# Patient Record
Sex: Female | Born: 1940 | Race: Black or African American | Hispanic: No | Smoking: Current every day smoker
Health system: Southern US, Community
[De-identification: ages and names within clinical notes are randomized; demographics above are authoritative.]

## PROBLEM LIST (undated history)

## (undated) DIAGNOSIS — I739 Peripheral vascular disease, unspecified: Secondary | ICD-10-CM

## (undated) DIAGNOSIS — I351 Nonrheumatic aortic (valve) insufficiency: Secondary | ICD-10-CM

## (undated) DIAGNOSIS — F0390 Unspecified dementia without behavioral disturbance: Secondary | ICD-10-CM

## (undated) DIAGNOSIS — I77810 Thoracic aortic ectasia: Secondary | ICD-10-CM

## (undated) DIAGNOSIS — I779 Disorder of arteries and arterioles, unspecified: Secondary | ICD-10-CM

## (undated) DIAGNOSIS — D510 Vitamin B12 deficiency anemia due to intrinsic factor deficiency: Secondary | ICD-10-CM

## (undated) DIAGNOSIS — R011 Cardiac murmur, unspecified: Secondary | ICD-10-CM

## (undated) DIAGNOSIS — R0602 Shortness of breath: Secondary | ICD-10-CM

## (undated) DIAGNOSIS — I712 Thoracic aortic aneurysm, without rupture, unspecified: Secondary | ICD-10-CM

## (undated) DIAGNOSIS — R609 Edema, unspecified: Secondary | ICD-10-CM

## (undated) DIAGNOSIS — M109 Gout, unspecified: Secondary | ICD-10-CM

## (undated) DIAGNOSIS — M1990 Unspecified osteoarthritis, unspecified site: Secondary | ICD-10-CM

## (undated) DIAGNOSIS — K219 Gastro-esophageal reflux disease without esophagitis: Secondary | ICD-10-CM

## (undated) DIAGNOSIS — D573 Sickle-cell trait: Secondary | ICD-10-CM

## (undated) DIAGNOSIS — M129 Arthropathy, unspecified: Secondary | ICD-10-CM

## (undated) DIAGNOSIS — J069 Acute upper respiratory infection, unspecified: Secondary | ICD-10-CM

## (undated) DIAGNOSIS — Z720 Tobacco use: Secondary | ICD-10-CM

## (undated) DIAGNOSIS — I6523 Occlusion and stenosis of bilateral carotid arteries: Secondary | ICD-10-CM

## (undated) DIAGNOSIS — I080 Rheumatic disorders of both mitral and aortic valves: Secondary | ICD-10-CM

## (undated) DIAGNOSIS — R0989 Other specified symptoms and signs involving the circulatory and respiratory systems: Secondary | ICD-10-CM

## (undated) DIAGNOSIS — I4891 Unspecified atrial fibrillation: Secondary | ICD-10-CM

## (undated) DIAGNOSIS — E7800 Pure hypercholesterolemia, unspecified: Secondary | ICD-10-CM

## (undated) DIAGNOSIS — E782 Mixed hyperlipidemia: Secondary | ICD-10-CM

## (undated) DIAGNOSIS — D649 Anemia, unspecified: Secondary | ICD-10-CM

## (undated) DIAGNOSIS — R931 Abnormal findings on diagnostic imaging of heart and coronary circulation: Secondary | ICD-10-CM

## (undated) DIAGNOSIS — R5383 Other fatigue: Secondary | ICD-10-CM

## (undated) DIAGNOSIS — R0789 Other chest pain: Secondary | ICD-10-CM

## (undated) DIAGNOSIS — I10 Essential (primary) hypertension: Secondary | ICD-10-CM

## (undated) DIAGNOSIS — I359 Nonrheumatic aortic valve disorder, unspecified: Secondary | ICD-10-CM

## (undated) HISTORY — DX: Shortness of breath: R06.02

## (undated) HISTORY — DX: Anemia, unspecified: D64.9

## (undated) HISTORY — PX: TOTAL THYROIDECTOMY: SHX2547

## (undated) HISTORY — DX: Thoracic aortic ectasia: I77.810

## (undated) HISTORY — DX: Disorder of arteries and arterioles, unspecified: I77.9

## (undated) HISTORY — DX: Abnormal findings on diagnostic imaging of heart and coronary circulation: R93.1

## (undated) HISTORY — DX: Acute upper respiratory infection, unspecified: J06.9

## (undated) HISTORY — DX: Edema, unspecified: R60.9

## (undated) HISTORY — DX: Arthropathy, unspecified: M12.9

## (undated) HISTORY — DX: Other chest pain: R07.89

## (undated) HISTORY — DX: Occlusion and stenosis of bilateral carotid arteries: I65.23

## (undated) HISTORY — DX: Thoracic aortic aneurysm, without rupture: I71.2

## (undated) HISTORY — DX: Nonrheumatic aortic valve disorder, unspecified: I35.9

## (undated) HISTORY — DX: Sickle-cell trait: D57.3

## (undated) HISTORY — DX: Mixed hyperlipidemia: E78.2

## (undated) HISTORY — DX: Peripheral vascular disease, unspecified: I73.9

## (undated) HISTORY — DX: Other fatigue: R53.83

## (undated) HISTORY — DX: Other specified symptoms and signs involving the circulatory and respiratory systems: R09.89

## (undated) HISTORY — DX: Cardiac murmur, unspecified: R01.1

## (undated) HISTORY — DX: Nonrheumatic aortic (valve) insufficiency: I35.1

## (undated) HISTORY — DX: Rheumatic disorders of both mitral and aortic valves: I08.0

## (undated) HISTORY — DX: Tobacco use: Z72.0

## (undated) HISTORY — DX: Thoracic aortic aneurysm, without rupture, unspecified: I71.20

---

## 1969-01-09 HISTORY — PX: ABDOMINAL HYSTERECTOMY: SHX81

## 1969-01-09 HISTORY — PX: APPENDECTOMY: SHX54

## 2003-07-31 ENCOUNTER — Emergency Department (HOSPITAL_COMMUNITY): Admission: EM | Admit: 2003-07-31 | Discharge: 2003-07-31 | Payer: Self-pay | Admitting: Emergency Medicine

## 2006-09-30 ENCOUNTER — Other Ambulatory Visit: Admission: RE | Admit: 2006-09-30 | Discharge: 2006-09-30 | Payer: Self-pay | Admitting: Unknown Physician Specialty

## 2006-12-08 ENCOUNTER — Encounter (HOSPITAL_BASED_OUTPATIENT_CLINIC_OR_DEPARTMENT_OTHER): Payer: Self-pay | Admitting: General Surgery

## 2006-12-08 ENCOUNTER — Ambulatory Visit (HOSPITAL_COMMUNITY): Admission: RE | Admit: 2006-12-08 | Discharge: 2006-12-09 | Payer: Self-pay | Admitting: General Surgery

## 2008-06-29 IMAGING — CR DG CHEST 2V
2 series · 2 of 2 positions shown · non-contrast
Comparison: 07/31/2003

CLINICAL DATA: Thyroid mass. Preop respiratory exam. 
 CHEST - 2 VIEW:

[view not recorded (1 of 2)]
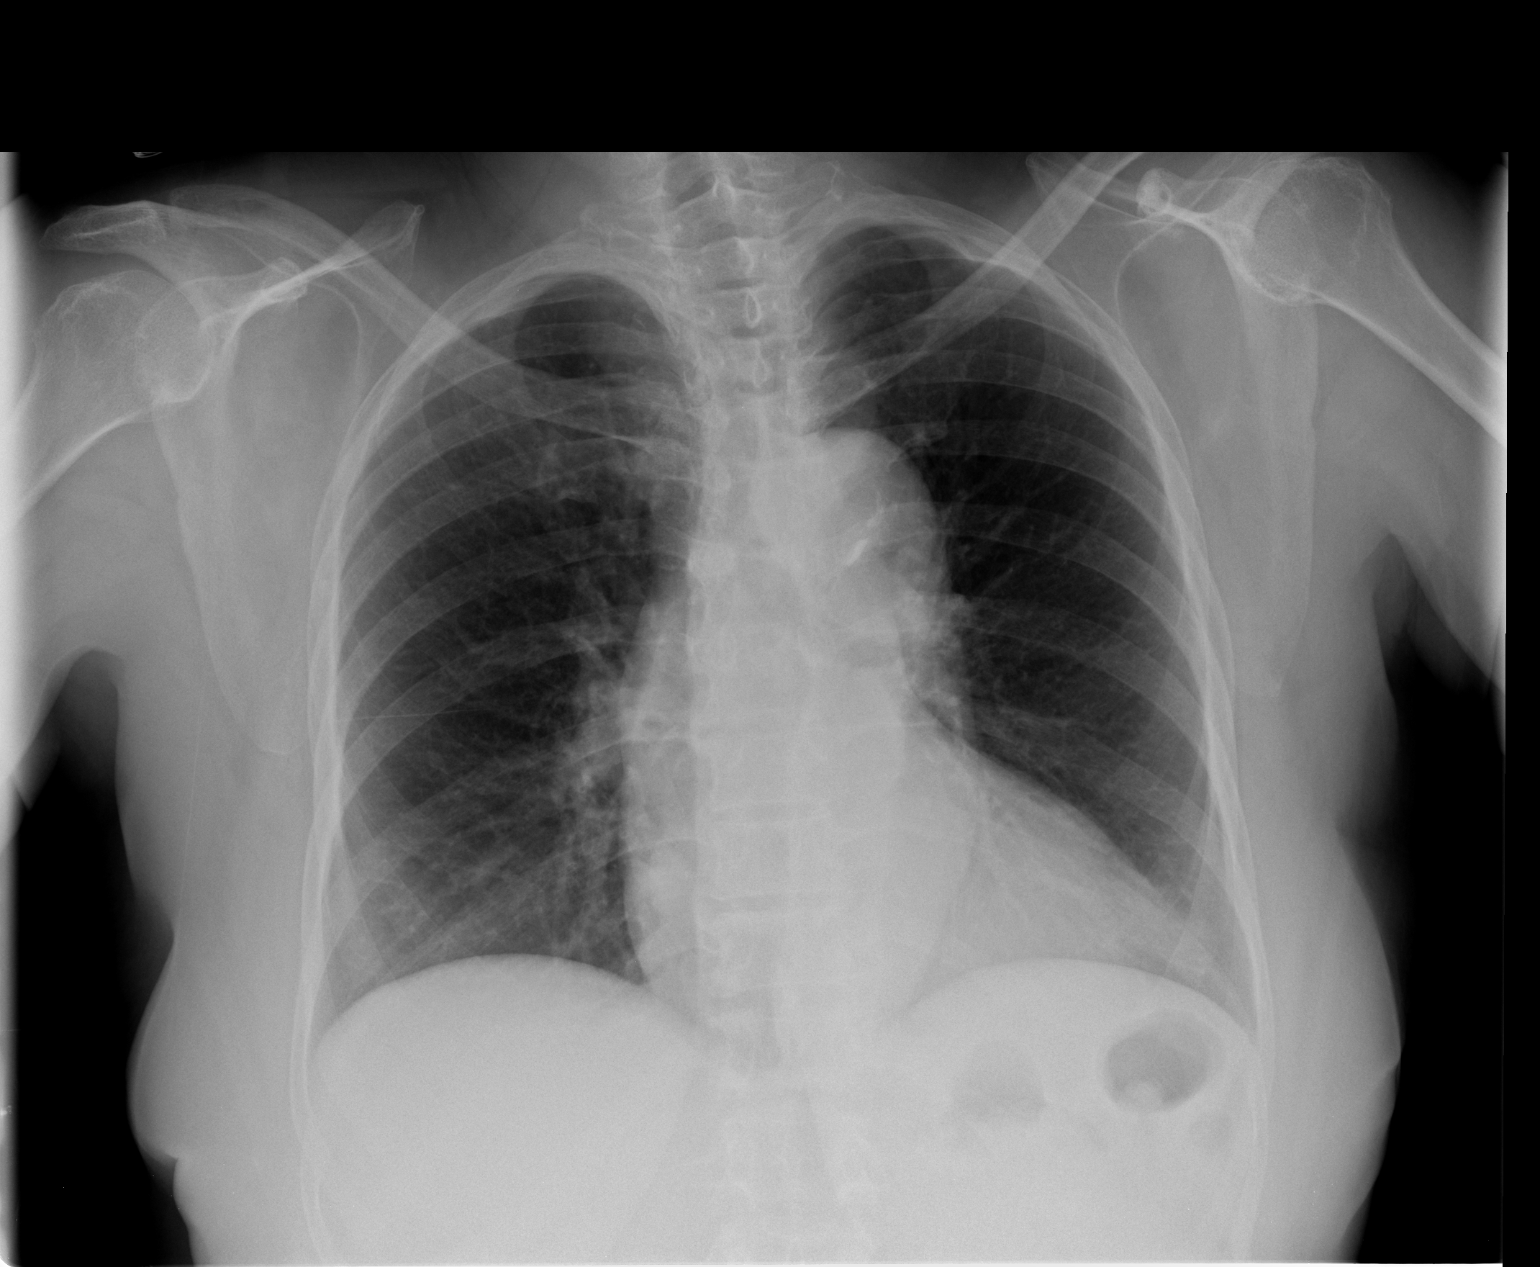

[view not recorded (2 of 2)]
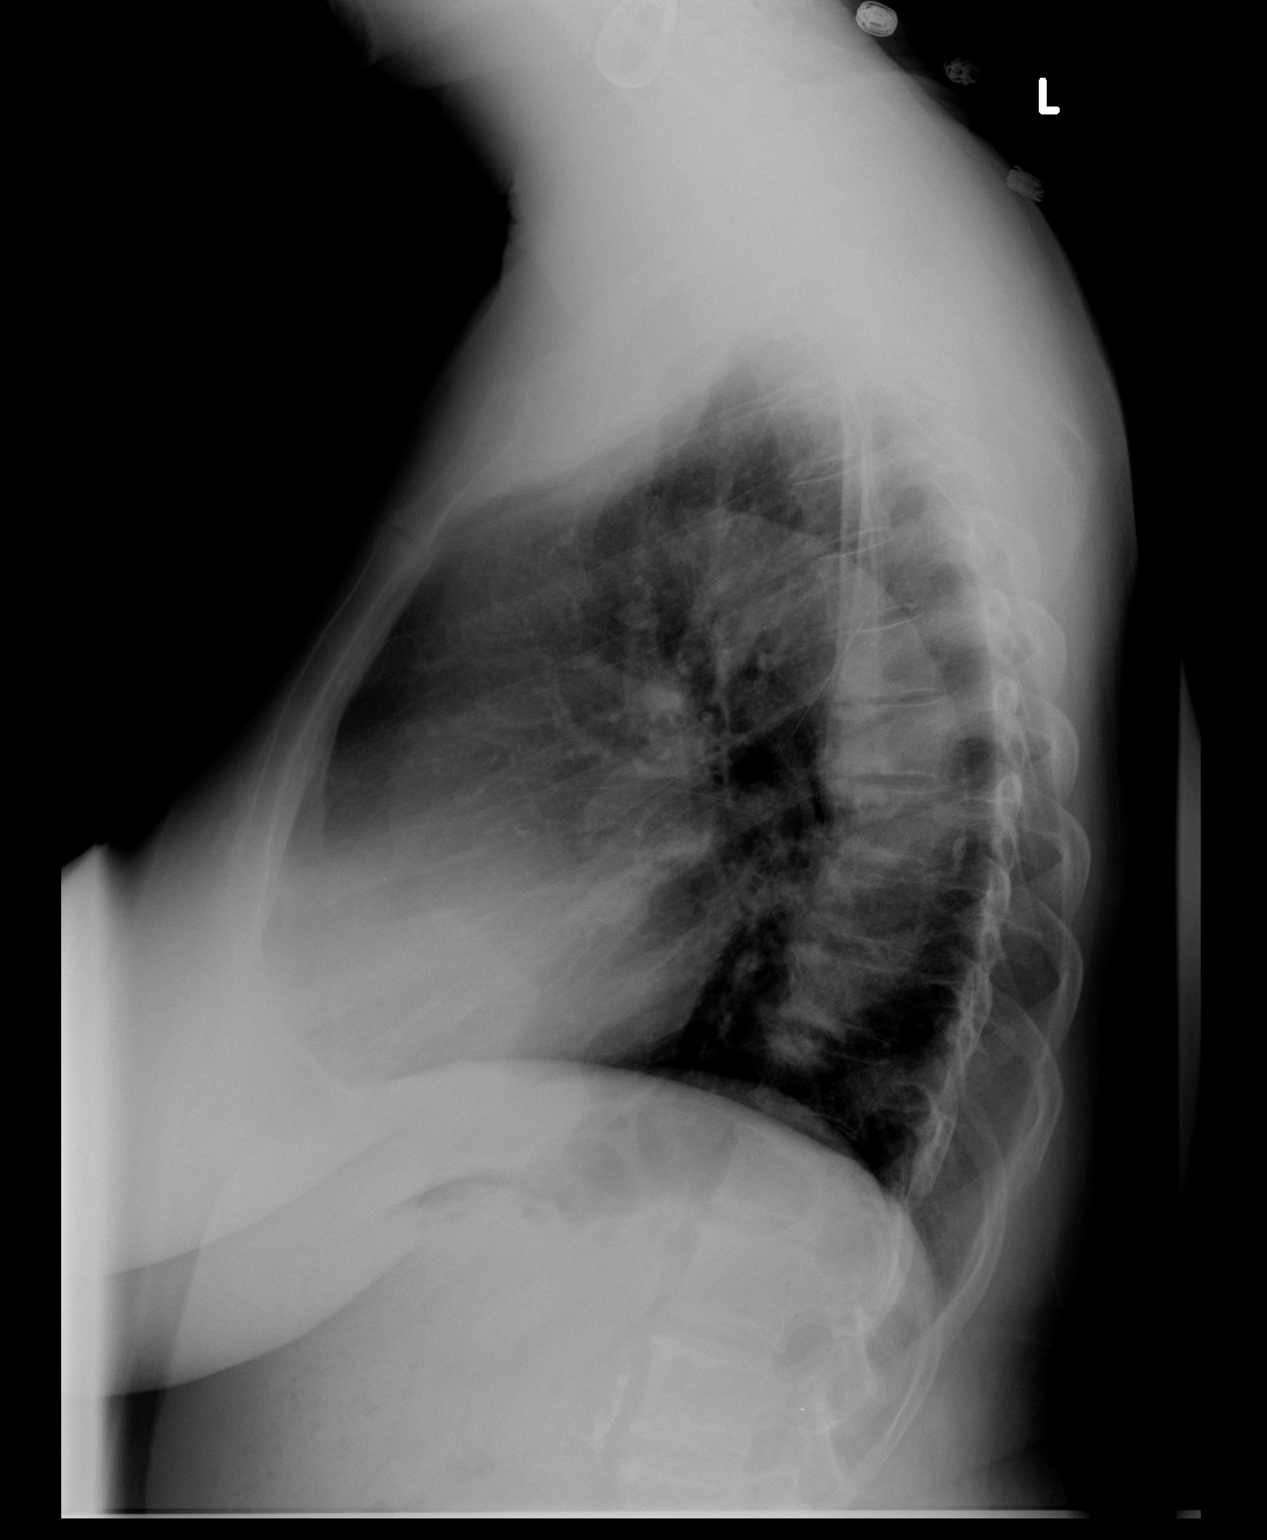

[2 of 2 positions shown; findings below may reference images not displayed]

FINDINGS: Mild cardiomegaly is stable as well as tortuosity of the thoracic aorta. Both lungs are clear. There is no evidence of pleural effusion. No mass or adenopathy identified.
IMPRESSION: Stable mild cardiomegaly. No active disease.

## 2010-09-23 NOTE — Op Note (Signed)
NAMEMARIAMAWIT, Holly Guerra                  ACCOUNT NO.:  [id number]   MEDICAL RECORD NO.:  [id number]          PATIENT TYPE:  OIB   LOCATION:  5727                         FACILITY:  [facility]   PHYSICIAN:  Leonie Man, M.D.   DATE OF BIRTH:  1940-07-06   DATE OF PROCEDURE:  12/08/2006  DATE OF DISCHARGE:                               OPERATIVE REPORT   PREOPERATIVE DIAGNOSIS:  Right thyroid lobe mass.   POSTOPERATIVE DIAGNOSIS:  Follicular lesion right thyroid.   PROCEDURE:  Right thyroid lobectomy and isthmusectomy.   SURGEON:  Leonie Man, M.D.   ASSISTANT:  Currie Paris, M.D.   ANESTHESIA:  General.   SPECIMENS TO LAB:  Right thyroid lobe and isthmus.   ESTIMATED BLOOD LOSS:  Minimal.   COMPLICATIONS:  None apparent.   DISPOSITION:  The patient returned to the PACU in excellent condition.   INDICATIONS:  The patient is a 70 year old widowed [place]  female who, during the course of her Doppler studies, was noted to have  a solid nodule of the right thyroid gland and this nodule was located  right at the junction of the right lobe isthmus.  This lesion on biopsy  is calcified and shows a follicular lesion.  The patient also does have  some pressure symptoms on swallowing.  The patient now comes to the  operating room after risks and potential benefits of surgery have been  fully discussed including a discussion of recurrent laryngeal nerve  injury and permanent or temporary hypoparathyroidism.  She understands  and gives her consent for surgery.   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   PROCEDURE IN DETAIL:  The patient is positioned supinely and, following  induction of satisfactory general endotracheal anesthesia, the neck is  prepped and draped to be included in a sterile operative field.  A  transverse collar incision is deepened through  skin and subcutaneous  tissues and across the platysma and a myocutaneous flap raised  superiorly to the thyroid cartilage and inferior to the sternal notch.  The midline strap muscles are divided in the midline and dissection  carried down over onto the right side and dissected up to the right  upper pole where the upper pole vessels were secured with 2-0 silk ties  and transected with the harmonic scalpel.  The upper pole parathyroid  was identified and dissected free from off its attachment to the thyroid  gland and spared.  An inferior parathyroid on the right side was not  positively identified. The recurrent laryngeal nerve was identified  throughout the course of the procedure and spared.  The right thyroid  gland was dissected free from the surrounding tissues, carrying the  dissection medially over the trachea and taking with this the entire  isthmus of the thyroid.  The thyroid isthmus was transected at its  attachment to the left thyroid lobe and this was done with the harmonic  scalpel.   The specimen was marked with a  suture in the superior pole of the right  lobe and forwarded for pathologic evaluation.  Frozen section diagnosis  was that of a follicular lesion.  No additional lesions were reported  upon.  Sponge, instrument and sharp counts were verified.  The right  neck was thoroughly irrigated with normal saline and Surgicel pads were  placed within the paratracheal groove so as to effect additional  hemostasis.  The midline strap muscles were closed with interrupted 3-0  Vicryl sutures.  The platysma muscle was reapproximated with 0 Vicryl  sutures and the skin closed with a running 4-0 Monocryl suture which was  placed intradermally.  A sterile dressing was applied, the anesthetic  reversed, and the patient removed from the operating room to the  recovery room in stable condition.  She tolerated the procedure well.      Leonie Man, M.D.  Electronically  Signed     PB/MEDQ  D:  12/08/2006  T:  12/08/2006  Job:  [id number]   cc:   Dorisann Frames, M.D.

## 2010-09-26 NOTE — Consult Note (Signed)
Holly Guerra, Holly Guerra                              ACCOUNT NO.:  [id number]   MEDICAL RECORD NO.:  [id number]                   PATIENT TYPE:  EMS   LOCATION:  ED                                   [facility]ACILITY:  [facility]   PHYSICIAN:  Colleen Can. Deborah Chalk, M.D.            DATE OF BIRTH:  1940-06-09   DATE OF CONSULTATION:  07/31/2003  DATE OF DISCHARGE:                                   CONSULTATION   CHIEF COMPLAINT:  Chest pain.   HISTORY:  Holly Guerra is a 70 year old black female with known abnormal EKG  with a history of diabetes who presents with recurrent episodes of vague  chest discomfort. She had prior cardiac evaluation in October 2004 in [place]. There was no report of catheterization.  She had a Cardiolite study  apparently at that point in time. She has had a known abnormal EKG and  copies of that are sent to use from [facility].  She describes this  discomfort as a cramp like sensation over the past week, more frequently  occurred today while she was at work and clearly changed with position. It  has also had some abdominal quality. There is no associated shortness of  breath or diaphoresis. While she was having discomfort at work, she was  brought to the emergency room.   She has a past medical history which includes tobacco abuse, diabetes  mellitus, an abnormal EKG, palpitations, hypertension, alcohol abuse and  prior GI bleed.   Other surgeries include hysterectomy, a cyst removed from her left shoulder,  history of hiatal hernia.   ALLERGIES:  None.   She is questionably aspirin intolerant because of her GI symptoms.   CURRENT MEDICATIONS:  Toprol, Benicar, hydrochlorothiazide, Norvasc 10 mg a  day, Kay Ciel and Celebrex.   FAMILY HISTORY:  Mother had an MI in her 40's.   SOCIAL HISTORY:  She is a smoker, she works at [employer]. Holly Guerra, she is a sewer.  She has two children, she is widowed, she lives by herself.   REVIEW OF SYMPTOMS:  Reasonably unremarkable. She has had  kidney stones,  there is no GU problems, no GI problems, she has had a history of hiatal  hernia, history of GI bleed. There has been no cough, no cough of blood. She  has not had significant diaphoresis. She remains reasonably active.  These  discomforts and cramps basically have been going on for the last 1-2 weeks.   PHYSICAL EXAMINATION:  GENERAL:  She is in no acute distress. She is  pleasant.  VITAL SIGNS:  Blood pressure 116/74, heart rate 67, respiratory rate 18.  SKIN:  Warm and dry.  Color is normal.  LUNGS:  Reasonably clear.  HEART:  Showed a regular rate and rhythm, soft outflow murmur with aortic  insufficiency.  ABDOMEN:  Soft, nontender.  EXTREMITIES:  Without edema.   PERTINENT LABS:  White  count is 4000, hematocrit is 34, platelets of  306,000. Chemistries showed sodium 132, potassium 5.0, chloride of 103, CO2  of 19, BUN of 45, creatinine of 2.4 with a glucose of 135. CK and troponins  are all negative.  SGOT is 103, SGPT of 99.   EKG shows abnormal T waves anteriorly in normal sinus rhythm but is  basically unchanged from EKG's dating back to 1999.   IMPRESSION:  1. Atypical chest pain for coronary ischemia.  2. Abnormal EKG dating back to 1999.  3. Tobacco abuse.  4. Adult onset diabetes mellitus.  5. Renal insufficiency.  6. History of ethanol use.   PLAN:  I doubt this really represents an acute coronary syndrome. I would  like to arrange for followup with her cardiologist.  She has had previous  outpatient stress tests and I think that would be appropriate for her at  this point in time. Catheterization may have some added risk because of her  elevated BUN and creatinine but if symptoms persist that would need to be  considered. We will add Protonix at this point in time because of the  history of hiatal hernia and the possible gastroesophageal reflux disease.                                               Colleen Can. Deborah Chalk, M.D.    SNT/MEDQ  D:   07/31/2003  T:  07/31/2003  Job:  [id number]   cc:   Dr. __________, [facility]   Dr. Elvera Lennox. Ward, [place], [place]

## 2011-02-23 LAB — DIFFERENTIAL
Basophils Relative: 1
Eosinophils Absolute: 0.2
Lymphocytes Relative: 30
Monocytes Relative: 10
Neutro Abs: 4.1

## 2011-02-23 LAB — COMPREHENSIVE METABOLIC PANEL
Albumin: 3.7
Alkaline Phosphatase: 58
BUN: 10
GFR calc Af Amer: 60 — ABNORMAL LOW
Potassium: 4.2
Total Protein: 6.8

## 2011-02-23 LAB — CALCIUM
Calcium: 8.6
Calcium: 8.8

## 2011-02-23 LAB — APTT: aPTT: 28

## 2011-02-23 LAB — PROTIME-INR: INR: 0.9

## 2011-02-23 LAB — CBC
HCT: 31.3 — ABNORMAL LOW
Platelets: 322
RDW: 21.5 — ABNORMAL HIGH

## 2012-06-16 HISTORY — PX: DOPPLER ECHOCARDIOGRAPHY: SHX263

## 2012-06-16 HISTORY — PX: CARDIOVASCULAR STRESS TEST: SHX262

## 2012-07-14 ENCOUNTER — Other Ambulatory Visit (HOSPITAL_COMMUNITY): Payer: Self-pay | Admitting: Cardiovascular Disease

## 2012-07-14 DIAGNOSIS — I739 Peripheral vascular disease, unspecified: Secondary | ICD-10-CM

## 2012-07-18 ENCOUNTER — Ambulatory Visit (HOSPITAL_COMMUNITY)
Admission: RE | Admit: 2012-07-18 | Discharge: 2012-07-18 | Disposition: A | Discharge status: Home / Self Care | Payer: 59 | Source: Ambulatory Visit | Attending: Cardiovascular Disease | Admitting: Cardiovascular Disease

## 2012-07-18 DIAGNOSIS — I739 Peripheral vascular disease, unspecified: Secondary | ICD-10-CM | POA: Insufficient documentation

## 2012-07-18 NOTE — Progress Notes (Signed)
BLE Arterial Duplex Completed. Sturdivant, Rita D  

## 2012-07-19 ENCOUNTER — Other Ambulatory Visit: Payer: Self-pay | Admitting: *Deleted

## 2012-07-20 ENCOUNTER — Encounter (HOSPITAL_COMMUNITY): Payer: Self-pay | Admitting: Pharmacy Technician

## 2012-07-22 ENCOUNTER — Encounter (HOSPITAL_COMMUNITY)
Admission: RE | Disposition: A | Discharge status: Home / Self Care | Payer: Self-pay | Source: Ambulatory Visit | Attending: Cardiovascular Disease

## 2012-07-22 ENCOUNTER — Encounter (HOSPITAL_COMMUNITY): Payer: Self-pay | Admitting: General Practice

## 2012-07-22 ENCOUNTER — Ambulatory Visit (HOSPITAL_COMMUNITY)
Admission: RE | Admit: 2012-07-22 | Discharge: 2012-07-24 | Disposition: A | Discharge status: Home / Self Care | Payer: 59 | Source: Ambulatory Visit | Attending: Cardiovascular Disease | Admitting: Cardiovascular Disease

## 2012-07-22 DIAGNOSIS — I7092 Chronic total occlusion of artery of the extremities: Secondary | ICD-10-CM | POA: Insufficient documentation

## 2012-07-22 DIAGNOSIS — I70219 Atherosclerosis of native arteries of extremities with intermittent claudication, unspecified extremity: Secondary | ICD-10-CM | POA: Insufficient documentation

## 2012-07-22 DIAGNOSIS — I10 Essential (primary) hypertension: Secondary | ICD-10-CM

## 2012-07-22 DIAGNOSIS — I739 Peripheral vascular disease, unspecified: Secondary | ICD-10-CM

## 2012-07-22 DIAGNOSIS — Z720 Tobacco use: Secondary | ICD-10-CM

## 2012-07-22 DIAGNOSIS — Z87891 Personal history of nicotine dependence: Secondary | ICD-10-CM

## 2012-07-22 DIAGNOSIS — E785 Hyperlipidemia, unspecified: Secondary | ICD-10-CM

## 2012-07-22 DIAGNOSIS — Z8249 Family history of ischemic heart disease and other diseases of the circulatory system: Secondary | ICD-10-CM | POA: Insufficient documentation

## 2012-07-22 HISTORY — DX: Vitamin B12 deficiency anemia due to intrinsic factor deficiency: D51.0

## 2012-07-22 HISTORY — PX: PERCUTANEOUS STENT INTERVENTION: SHX6019

## 2012-07-22 HISTORY — PX: LOWER EXTREMITY ANGIOGRAM: SHX5508

## 2012-07-22 HISTORY — DX: Unspecified osteoarthritis, unspecified site: M19.90

## 2012-07-22 HISTORY — DX: Peripheral vascular disease, unspecified: I73.9

## 2012-07-22 HISTORY — DX: Gout, unspecified: M10.9

## 2012-07-22 HISTORY — DX: Pure hypercholesterolemia, unspecified: E78.00

## 2012-07-22 HISTORY — DX: Essential (primary) hypertension: I10

## 2012-07-22 HISTORY — DX: Gastro-esophageal reflux disease without esophagitis: K21.9

## 2012-07-22 HISTORY — DX: Shortness of breath: R06.02

## 2012-07-22 LAB — POCT ACTIVATED CLOTTING TIME
Activated Clotting Time: 198 seconds
Activated Clotting Time: 149 seconds

## 2012-07-22 LAB — CBC
HCT: 32.7 % — ABNORMAL LOW (ref 36.0–46.0)
MCH: 26.9 pg (ref 26.0–34.0)
MCV: 82.2 fL (ref 78.0–100.0)
RBC: 3.98 MIL/uL (ref 3.87–5.11)
WBC: 6.8 10*3/uL (ref 4.0–10.5)

## 2012-07-22 LAB — GLUCOSE, CAPILLARY: Glucose-Capillary: 119 mg/dL — ABNORMAL HIGH (ref 70–99)

## 2012-07-22 SURGERY — ANGIOGRAM, LOWER EXTREMITY
Anesthesia: LOCAL

## 2012-07-22 MED ORDER — ACETAMINOPHEN 325 MG PO TABS: 650.0000 mg | ORAL_TABLET | ORAL | Status: DC | PRN

## 2012-07-22 MED ORDER — ATORVASTATIN CALCIUM 40 MG PO TABS
40.0000 mg | ORAL_TABLET | Freq: Every day | ORAL | Status: DC
  Administered 2012-07-23: 40 mg via ORAL
  Filled 2012-07-22 (×3): qty 1

## 2012-07-22 MED ORDER — SODIUM CHLORIDE 0.9 % IV SOLN
INTRAVENOUS | Status: DC
  Administered 2012-07-22: 06:00:00 via INTRAVENOUS

## 2012-07-22 MED ORDER — BENAZEPRIL HCL 40 MG PO TABS
40.0000 mg | ORAL_TABLET | Freq: Every day | ORAL | Status: DC
  Administered 2012-07-22 – 2012-07-24 (×3): 40 mg via ORAL
  Filled 2012-07-22 (×4): qty 1

## 2012-07-22 MED ORDER — MIDAZOLAM HCL 2 MG/2ML IJ SOLN
INTRAMUSCULAR | Status: AC
  Filled 2012-07-22: qty 2

## 2012-07-22 MED ORDER — LIDOCAINE HCL (PF) 1 % IJ SOLN
INTRAMUSCULAR | Status: AC
  Filled 2012-07-22: qty 30

## 2012-07-22 MED ORDER — BUTALBITAL-APAP-CAFF-COD 50-325-40-30 MG PO CAPS: 1.0000 | ORAL_CAPSULE | ORAL | Status: DC | PRN

## 2012-07-22 MED ORDER — SODIUM CHLORIDE 0.9 % IV SOLN: INTRAVENOUS | Status: AC

## 2012-07-22 MED ORDER — AMLODIPINE BESYLATE 5 MG PO TABS
5.0000 mg | ORAL_TABLET | Freq: Every day | ORAL | Status: DC
  Administered 2012-07-22 – 2012-07-23 (×2): 5 mg via ORAL
  Filled 2012-07-22 (×3): qty 1

## 2012-07-22 MED ORDER — HEPARIN SODIUM (PORCINE) 1000 UNIT/ML IJ SOLN
INTRAMUSCULAR | Status: AC
  Filled 2012-07-22: qty 1

## 2012-07-22 MED ORDER — BUTALBITAL-APAP-CAFFEINE 50-325-40 MG PO TABS: 1.0000 | ORAL_TABLET | ORAL | Status: DC | PRN

## 2012-07-22 MED ORDER — ASPIRIN 81 MG PO CHEW
CHEWABLE_TABLET | ORAL | Status: AC
  Filled 2012-07-22: qty 3

## 2012-07-22 MED ORDER — IBUPROFEN 200 MG PO TABS
200.0000 mg | ORAL_TABLET | Freq: Four times a day (QID) | ORAL | Status: DC | PRN
  Filled 2012-07-22: qty 1

## 2012-07-22 MED ORDER — HYDRALAZINE HCL 20 MG/ML IJ SOLN
10.0000 mg | Freq: Once | INTRAMUSCULAR | Status: AC
  Administered 2012-07-22: 10 mg via INTRAVENOUS
  Filled 2012-07-22: qty 0.5

## 2012-07-22 MED ORDER — FENTANYL CITRATE 0.05 MG/ML IJ SOLN
INTRAMUSCULAR | Status: AC
  Filled 2012-07-22: qty 2

## 2012-07-22 MED ORDER — HEPARIN (PORCINE) IN NACL 2-0.9 UNIT/ML-% IJ SOLN
INTRAMUSCULAR | Status: AC
  Filled 2012-07-22: qty 1000

## 2012-07-22 MED ORDER — ALLOPURINOL 100 MG PO TABS
100.0000 mg | ORAL_TABLET | Freq: Every day | ORAL | Status: DC
  Administered 2012-07-23 – 2012-07-24 (×2): 100 mg via ORAL
  Filled 2012-07-22 (×3): qty 1

## 2012-07-22 MED ORDER — BUTALBITAL-APAP-CAFF-COD 50-325-40-30 MG PO CAPS
1.0000 | ORAL_CAPSULE | ORAL | Status: DC | PRN
  Filled 2012-07-22: qty 1

## 2012-07-22 MED ORDER — DIAZEPAM 5 MG PO TABS
ORAL_TABLET | ORAL | Status: AC
  Filled 2012-07-22: qty 1

## 2012-07-22 MED ORDER — SODIUM CHLORIDE 0.9 % IJ SOLN: 3.0000 mL | INTRAMUSCULAR | Status: DC | PRN

## 2012-07-22 MED ORDER — ONDANSETRON HCL 4 MG/2ML IJ SOLN: 4.0000 mg | Freq: Four times a day (QID) | INTRAMUSCULAR | Status: DC | PRN

## 2012-07-22 MED ORDER — MORPHINE SULFATE 2 MG/ML IJ SOLN
2.0000 mg | INTRAMUSCULAR | Status: DC | PRN
  Administered 2012-07-22 – 2012-07-23 (×4): 2 mg via INTRAVENOUS
  Filled 2012-07-22 (×4): qty 1

## 2012-07-22 MED ORDER — PANTOPRAZOLE SODIUM 40 MG PO TBEC
40.0000 mg | DELAYED_RELEASE_TABLET | Freq: Every day | ORAL | Status: DC
  Administered 2012-07-23: 40 mg via ORAL
  Filled 2012-07-22 (×2): qty 1

## 2012-07-22 MED ORDER — DIAZEPAM 5 MG PO TABS
5.0000 mg | ORAL_TABLET | ORAL | Status: AC
  Administered 2012-07-22: 5 mg via ORAL

## 2012-07-22 MED ORDER — NEBIVOLOL HCL 10 MG PO TABS
10.0000 mg | ORAL_TABLET | Freq: Every day | ORAL | Status: DC
  Administered 2012-07-22 – 2012-07-24 (×3): 10 mg via ORAL
  Filled 2012-07-22 (×4): qty 1

## 2012-07-22 MED ORDER — CLOPIDOGREL BISULFATE 300 MG PO TABS
ORAL_TABLET | ORAL | Status: AC
Start: 2012-07-22 — End: 2012-07-22
  Filled 2012-07-22: qty 1

## 2012-07-22 NOTE — H&P (Signed)
  H & P will be scanned in.  Pt was reexamined and existing H & P reviewed. No changes found.  Runell Gess, MD [facility] 07/22/2012 7:42 AM

## 2012-07-22 NOTE — Progress Notes (Signed)
Utilization Review Completed Camellia J. Wood, RN, BSN, NCM [phone]  

## 2012-07-22 NOTE — CV Procedure (Signed)
Holly Guerra is a 72 y.o. female    [id number] LOCATION:  FACILITY: [facility]  PHYSICIAN: Nanetta Batty, M.D. October 11, 1940   DATE OF PROCEDURE:  07/22/2012  DATE OF DISCHARGE:  [facility]  PV Intervention    History obtained from chart review. Holly Guerra is a 72 year old appearing widowed African American female, para 2, grandmother and 5 grandchildren currently still works at the [employer]. She is referred through the courtesy of Burlene Arnt PA-C for peripheral vascular evaluation. Her cardiovascular risk factor profile positive for 45-pack-year history of tobacco abuse having quit 5 years ago. History of hypertension and hyperlipidemia as well as family history of heart disease. She does complain of bilateral lower 70 ophthalmic location. Doppler suggests an occluded left SFA with an ABI of 0.6 on the left. She presents now for angiography and potential percutaneous intervention.   PROCEDURE DESCRIPTION:    The patient was brought to the second floor [facility] cath lab in the postabsorptive state. She waspremedicated with Valium 5 mg by mouth, IV Versed and fentanyl.. Her right groin was prepped and shaved in usual sterile fashion. Xylocaine 1% was used for local anesthesia. A 5 French sheath was inserted into the right common femoral artery using standard Seldinger technique. A 5 French pigtail catheter was used for abdominal aortography with bifemoral runoff and bolus chase digital subtraction spelled table technique. Visipaque dye was used for the entirety of the case. Retrograde aortic pressure was monitored during the case.   HEMODYNAMICS:    AO SYSTOLIC/AO DIASTOLIC: 159/79  Angiographic results:   1: Abdominal aortogram-renal arteries were widely patent. There was moderate atherosclerotic changes of the infra-renal abdominal aorta.  2: Left lower extremity-this was fluoroscopically calcified in the midportion of the SFA. 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 We will proceed with PTA and stenting of the moderately long segment left SFA calcified CTL using the Viance crossing catheter.   Procedure description: The patient received 7000 units of heparin with an ACT of 198 at the end of the case. Contralateral access was obtained with a crossover catheter, 035 angled Glidewire and a 6 [place] destination sheath over an Building surveyor. A total of 251 cc of contrast administered to the patient. Using an 014 300 cm length [place] core wire and the Viance  crossing catheter I was able to cross the chronic total occlusion with moderate difficulty. Following this I used a 4 mm x 150 L long over the wire a 14 balloon to predilate. I then deployed overlapping 6 x 130 mm long EV 3 protg Nitinol self expanding stent in the proximal and mid SFA post dilated with a 5 mm x 120 mm long balloon resulting in reduction of moderately long segment calcified mid left SFA CTO to 0% residual with excellent flow. The patient received 3 baby aspirin chewable addition to what she had already been on as well as 300 mg of by mouth Plavix.  Final impression: Successful PTA and stenting of moderately long segment calcified mid left SFA chronic total occlusion using the Viance Crossing catheter,  PTA and stenting. The sheath was then withdrawn across the bifurcation a spot film was performed of the right popliteal and trifurcation. The sheath will be removed once ACT falls below 170. Visually hydrated overnight and sent home in the morning or aspirin products.  She will get followup Dopplers and ABIs in our office and will see me back after that for further evaluation.  Runell Gess MD, [facility] 07/22/2012 9:51 AM

## 2012-07-22 NOTE — Progress Notes (Signed)
Site area: right groin  Site Prior to Removal:  Level 0  Pressure Applied For 20 MINUTES    Minutes Beginning at 1400  Manual:   yes  Patient Status During Pull:  **stable *  Post Pull Groin Site:  Level 0  Post Pull Instructions Given:  yes  Post Pull Pulses Present:  yes  Dressing Applied:  yes  Comments:  Gauze dressing applied secured with medipore tape.

## 2012-07-23 ENCOUNTER — Encounter (HOSPITAL_COMMUNITY): Payer: Self-pay | Admitting: *Deleted

## 2012-07-23 DIAGNOSIS — E785 Hyperlipidemia, unspecified: Secondary | ICD-10-CM

## 2012-07-23 DIAGNOSIS — Z87891 Personal history of nicotine dependence: Secondary | ICD-10-CM

## 2012-07-23 DIAGNOSIS — I10 Essential (primary) hypertension: Secondary | ICD-10-CM

## 2012-07-23 DIAGNOSIS — I739 Peripheral vascular disease, unspecified: Secondary | ICD-10-CM

## 2012-07-23 DIAGNOSIS — Z720 Tobacco use: Secondary | ICD-10-CM

## 2012-07-23 LAB — CBC
Platelets: 205 10*3/uL (ref 150–400)
RBC: 3.7 MIL/uL — ABNORMAL LOW (ref 3.87–5.11)
RDW: 15.9 % — ABNORMAL HIGH (ref 11.5–15.5)
WBC: 8.1 10*3/uL (ref 4.0–10.5)

## 2012-07-23 LAB — BASIC METABOLIC PANEL
CO2: 25 mEq/L (ref 19–32)
Calcium: 8.9 mg/dL (ref 8.4–10.5)
GFR calc Af Amer: >90 mL/min (ref >90)
GFR calc non Af Amer: 84 mL/min — ABNORMAL LOW (ref >90)
Sodium: 138 mEq/L (ref 135–145)

## 2012-07-23 MED ORDER — AMLODIPINE BESYLATE 5 MG PO TABS
5.0000 mg | ORAL_TABLET | Freq: Once | ORAL | Status: AC
  Administered 2012-07-23: 10:00:00 5 mg via ORAL

## 2012-07-23 MED ORDER — OXYCODONE-ACETAMINOPHEN 5-325 MG PO TABS
1.0000 | ORAL_TABLET | ORAL | Status: DC | PRN
  Administered 2012-07-23 – 2012-07-24 (×3): 1 via ORAL
  Filled 2012-07-23 (×3): qty 1

## 2012-07-23 MED ORDER — AMLODIPINE BESYLATE 10 MG PO TABS
10.0000 mg | ORAL_TABLET | Freq: Every day | ORAL | Status: DC
  Administered 2012-07-24: 10 mg via ORAL
  Filled 2012-07-23: qty 1

## 2012-07-23 NOTE — Progress Notes (Signed)
Report to Darlyn Read RN. Patient transferred via wheelchair to 3W.

## 2012-07-23 NOTE — Progress Notes (Signed)
[facility]  Subjective: No right groin pain. Left thigh is sore.  Objective: Vital signs in last 24 hours: Temp:  [98.2 F (36.8 C)-99.4 F (37.4 C)] 99.3 F (37.4 C) (03/15 0700) Pulse Rate:  [48-74] 60 (03/15 0740) Resp:  [17-20] 18 (03/15 0700) BP: (109-202)/(51-159) 165/68 mmHg (03/15 0740) SpO2:  [93 %-100 %] 96 % (03/15 0740) Weight:  [140 lb 10.5 oz (63.8 kg)] 140 lb 10.5 oz (63.8 kg) (03/15 0000) Last BM Date: 07/21/12  Intake/Output from previous day: July 24, 2022 0701 - 03/15 0700 In: 1395 [P.O.:480; I.V.:915] Out: 600 [Urine:600] Intake/Output this shift:    Medications Current Facility-Administered Medications  Medication Dose Route Frequency Provider Last Rate Last Dose  . acetaminophen (TYLENOL) tablet 650 mg  650 mg Oral Q4H PRN Runell Gess, MD      . allopurinol (ZYLOPRIM) tablet 100 mg  100 mg Oral Daily Runell Gess, MD      . amLODipine (NORVASC) tablet 5 mg  5 mg Oral Daily Runell Gess, MD   5 mg at 07/23/12 1610  . atorvastatin (LIPITOR) tablet 40 mg  40 mg Oral q1800 Runell Gess, MD      . benazepril (LOTENSIN) tablet 40 mg  40 mg Oral Daily Runell Gess, MD   40 mg at 07/23/12 [phone]  . butalbital-acetaminophen-caffeine (FIORICET, ESGIC) 50-325-40 MG per tablet 1 tablet  1 tablet Oral Q4H PRN Runell Gess, MD      . ibuprofen (ADVIL,MOTRIN) tablet 200 mg  200 mg Oral Q6H PRN Runell Gess, MD      . morphine 2 MG/ML injection 2 mg  2 mg Intravenous Q1H PRN Runell Gess, MD   2 mg at 07/23/12 0035  . nebivolol (BYSTOLIC) tablet 10 mg  10 mg Oral Daily Runell Gess, MD   10 mg at 2012/07/23 1053  . ondansetron (ZOFRAN) injection 4 mg  4 mg Intravenous Q6H PRN Runell Gess, MD      . pantoprazole (PROTONIX) EC tablet 40 mg  40 mg Oral Q1200 Runell Gess, MD        PE: General appearance: alert, cooperative and no distress Lungs: clear to auscultation bilaterally Heart: regular rate and  rhythm Extremities: no LEE Pulses: 2+ radials, 2+ Lt. DP, 1+ Rt. DP Skin: warm and dry Neurologic: Grossly normal  Lab Results:   Recent Labs  July 23, 2012 0705 07/23/12 0444  WBC 6.8 8.1  HGB 10.7* 10.0*  HCT 32.7* 29.9*  PLT 188 205   BMET  Recent Labs  07/23/12 0444  NA 138  K 3.6  CL 104  CO2 25  GLUCOSE 112*  BUN 8  CREATININE 0.75  CALCIUM 8.9   PT/INR  Recent Labs  07/23/2012 0705  LABPROT 12.4  INR 0.93    Studies/Results: PV Intervention 23-Jul-2012  HEMODYNAMICS:  AO SYSTOLIC/AO DIASTOLIC: 159/79  Angiographic results:  1: Abdominal aortogram-renal arteries were widely patent. There was moderate atherosclerotic changes of the infra-renal abdominal aorta.  2: Left lower extremity-this was fluoroscopically calcified in the midportion of the SFA. There was a moderately long segment chronic total occlusion in the mid SFA reconstituting in the adductor canal with one vessel runoff via the peroneal.  3: Right lower extremities-was moderate atherosclerotic narrowing through the entirety of the SFA more notably in the adductor canal in the 50-60% range segmentally with one vessel runoff via a peroneal  IMPRESSION:Ms. Loux has diffuse SFA disease and below the  knee tibial disease left greater than right. We will proceed with PTA and stenting of the moderately long segment left SFA calcified CTL using the Viance crossing catheter.   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 Allyson Sabal. POD#1.  The right groin in stable. The left thigh is sore. HR is stable. BP is elevated with most recent BP of 165/68. She received her morning meds ~ 1 hour ago. Will recheck BP. Plan to d/c home today on ASA + Plavix. OP LEAs have been ordered. She will follow-up with Dr. Allyson Sabal in 2-3 weeks.     LOS: 1 day     Deolinda Frid M. Sharol Harness, PA-C 07/23/2012 9:10 AM

## 2012-07-23 NOTE — Progress Notes (Signed)
Pt. Seen and examined. Agree with the NP/PA-C note as written.  She reports continued pain in her left thigh - left foot is warm and appears well-perfused with a weak but palpable DP pulse as well as a peroneal pulse and popiteal pulse. There is no LE swelling. Will increase pain control medications. D/W Dr. Allyson Sabal, plan to improve BP control with increased dose amlodipine today - if bp better this afternoon and pain improved, could be discharged home, otherwise keep to tomorrow. Will need to transfer to telemetry floor today.  Chrystie Nose, MD, [facility] Attending Cardiologist [facility]

## 2012-07-23 NOTE — Progress Notes (Signed)
BP has improved. SBP this afternoon have been in the low 130's. Her left leg pain is better with prn percocet. Discharge was planned for this afternoon, but patient is unable to arrange transportation to her home in [facility]. She said that she will have to wait for daughter to return to hospital tomorrow morning. Plan for d/c in the am.   Allayne Butcher, PA-C [facility]

## 2012-07-24 ENCOUNTER — Other Ambulatory Visit: Payer: Self-pay | Admitting: Cardiology

## 2012-07-24 MED ORDER — OXYCODONE-ACETAMINOPHEN 5-325 MG PO TABS: 1.0000 | ORAL_TABLET | ORAL | Status: DC | PRN

## 2012-07-24 MED ORDER — ASPIRIN EC 81 MG PO TBEC: 81.0000 mg | DELAYED_RELEASE_TABLET | Freq: Every day | ORAL | Status: DC

## 2012-07-24 MED ORDER — CLOPIDOGREL BISULFATE 75 MG PO TABS: 75.0000 mg | ORAL_TABLET | Freq: Every day | ORAL | Status: AC

## 2012-07-24 MED ORDER — AMLODIPINE BESYLATE 10 MG PO TABS: 10.0000 mg | ORAL_TABLET | Freq: Every day | ORAL | Status: DC

## 2012-07-24 NOTE — Discharge Summary (Signed)
Physician Discharge Summary  Patient ID: Holly Guerra MRN: [id number] DOB/AGE: [birth year] 72 y.o.  Admit date: 07/22/2012 Discharge date: 07/24/2012  Admission Diagnoses: PAD  Discharge Diagnoses:  Principal Problem:   PAD- S/P PTA and stenting of left SFA 07/22/12 Active Problems:   HTN (hypertension)   HLD (hyperlipidemia)   History of tobacco abuse   Discharged Condition: stable  Hospital Course: The patient is a 72 y/o [name] who was referred to [facility] by Dr. Burlene Arnt for peripheral vascular evaluation. Her cardiovascular risk factor profile is positive for 45-pack-year history of tobacco abuse, having quit 5 years ago.  She has a history of hypertension and hyperlipidemia, as well as a family history of heart disease. The patient had endorsed bilateral lower extremity claudication.  Doppler studies suggested an occluded left SFA.  She had an ABI of 0.6 on the left. She presented to [facility] on 3/14 for angiography and potential percutaneous intervention.  The procedure was performed by Dr. Allyson Sabal. The abdominal aortogram revealed that the renal arteries were widely patent. There was moderate atherosclerotic changes of the infra-renal abdominal aorta. The left SFA was fluoroscopically calcified in the midportion. There was a moderately long segment chronic total occlusion in the mid SFA reconstituting in the adductor canal with one vessel runoff via the peroneal. In the right lower extremity, there was moderate atherosclerotic narrowing through the entirety of the SFA, more notably in the adductor canal in the 50-60% range segmentally with one vessel runoff via the peroneal. It was decided to intervene on the left SFA. She underwent successful PTA and stenting of the left SFA using the [place] catheter.  She left the cath lab in stable condition. She was kept overnight for hydration and observation. On post op day 1, she was hypertensive with SBPs in the 170s. She also endorsed pain in the left  upper thigh region. The left groin was stable, with no hematoma or bruit. The left foot appeared well-perfused with a weak but palpable DP pulse, as well as a peroneal pulse and popliteal pulse. She had no lower extremity swelling. Her pain medications were increased, as well as the dose of her amlodipine for better blood pressure control. The plan was discussed with Dr. Allyson Sabal and he agreed to the medication adjustments. It was decided to delay her discharge until her blood pressure and pain improved. She was reassessed several hours later. Her blood pressure improved, with SBPs in the low 130's. Her pain improved with percocet. She was last seen and evaluated by Dr. Rennis Golden, who felt she was stable for discharge home. She was discharged home on dual antiplatelet therapy with ASA + Plavix. Her amlodipine increased from 5 mg daily to 10 mg daily. She was given a prescription for 30 percocet's for pain, prn with no refills.  Lower extremity dopplers have been ordered for patient to have completed as an outpatient. She will follow-up with Dr. Allyson Sabal at [facility].   Consults: None  Significant Diagnostic Studies:   PV Angio 07/22/12 HEMODYNAMICS:  AO SYSTOLIC/AO DIASTOLIC: 159/79  Angiographic results:  1: Abdominal aortogram-renal arteries were widely patent. There was moderate atherosclerotic changes of the infra-renal abdominal aorta.  2: Left lower extremity-this was fluoroscopically calcified in the midportion of the SFA. There was a moderately long segment chronic total occlusion in the mid SFA reconstituting in the adductor canal with one vessel runoff via the peroneal.  3: Right lower extremities-was moderate atherosclerotic narrowing through the entirety of the SFA more notably in the  adductor canal in the 50-60% range segmentally with one vessel runoff via a peroneal   Treatments: See Hospital Course  Discharge Exam: Blood pressure 169/74, pulse 65, temperature 98.6 F (37 C), temperature source  Oral, resp. rate 18, height 5\' 2"  (1.575 m), weight 140 lb 10.5 oz (63.8 kg), SpO2 96.00%.   Disposition: 01-Home or Self Care  Discharge Orders   Future Orders Complete By Expires     Diet - low sodium heart healthy  As directed     Discharge instructions  As directed     Comments:      No driving for 2 days No lifting more than 1/2 gallon of milk for 2 days    Increase activity slowly  As directed         Medication List    TAKE these medications       allopurinol 100 MG tablet  Commonly known as:  ZYLOPRIM  Take 100 mg by mouth daily.     amLODipine 10 MG tablet  Commonly known as:  NORVASC  Take 1 tablet (10 mg total) by mouth daily.     aspirin EC 81 MG tablet  Take 1 tablet (81 mg total) by mouth daily.     atorvastatin 40 MG tablet  Commonly known as:  LIPITOR  Take 40 mg by mouth daily.     benazepril 40 MG tablet  Commonly known as:  LOTENSIN  Take 40 mg by mouth daily.     butalbital-acetaminophen-caffeine 50-325-40-30 MG per capsule  Commonly known as:  FIORICET WITH CODEINE  Take 1 capsule by mouth every 4 (four) hours as needed for headache. For pain     clopidogrel 75 MG tablet  Commonly known as:  PLAVIX  Take 1 tablet (75 mg total) by mouth daily.     ibuprofen 200 MG tablet  Commonly known as:  ADVIL,MOTRIN  Take 200 mg by mouth every 6 (six) hours as needed for pain. For pain     nebivolol 10 MG tablet  Commonly known as:  BYSTOLIC  Take 10 mg by mouth daily.     omeprazole 40 MG capsule  Commonly known as:  PRILOSEC  Take 40 mg by mouth daily.     oxyCODONE-acetaminophen 5-325 MG per tablet  Commonly known as:  PERCOCET/ROXICET  Take 1-2 tablets by mouth every 4 (four) hours as needed for pain.           Follow-up Information   Follow up with [facility]. (our office will call you to arrange an appointment)    Contact information:   [address] [place] [address] [phone]     TIME  SPENT ON DISCHARGE, INCLUDING PHYSICIAN TIME: >30 MINUTES  Signed: Allayne Butcher, PA-C 07/24/2012, 4:38 PM

## 2012-07-25 LAB — POCT ACTIVATED CLOTTING TIME
Activated Clotting Time: 198 seconds
Activated Clotting Time: 220 seconds

## 2012-07-26 ENCOUNTER — Other Ambulatory Visit (HOSPITAL_COMMUNITY): Payer: Self-pay | Admitting: Physician Assistant

## 2012-07-26 ENCOUNTER — Other Ambulatory Visit: Payer: Self-pay | Admitting: Physician Assistant

## 2012-07-26 DIAGNOSIS — I739 Peripheral vascular disease, unspecified: Secondary | ICD-10-CM

## 2012-07-26 DIAGNOSIS — Z9889 Other specified postprocedural states: Secondary | ICD-10-CM

## 2012-08-08 ENCOUNTER — Ambulatory Visit (HOSPITAL_COMMUNITY)
Admission: RE | Admit: 2012-08-08 | Discharge: 2012-08-08 | Disposition: A | Discharge status: Home / Self Care | Payer: 59 | Source: Ambulatory Visit | Attending: Cardiovascular Disease | Admitting: Cardiovascular Disease

## 2012-08-08 DIAGNOSIS — I739 Peripheral vascular disease, unspecified: Secondary | ICD-10-CM | POA: Insufficient documentation

## 2012-08-08 HISTORY — PX: OTHER SURGICAL HISTORY: SHX169

## 2012-08-08 NOTE — Progress Notes (Signed)
Arterial Duplex Left Lower Completed. Holly Guerra

## 2012-08-11 ENCOUNTER — Emergency Department (HOSPITAL_BASED_OUTPATIENT_CLINIC_OR_DEPARTMENT_OTHER)
Admission: EM | Admit: 2012-08-11 | Discharge: 2012-08-11 | Disposition: A | Discharge status: Home / Self Care | Payer: 59 | Attending: Emergency Medicine | Admitting: Emergency Medicine

## 2012-08-11 ENCOUNTER — Encounter (HOSPITAL_BASED_OUTPATIENT_CLINIC_OR_DEPARTMENT_OTHER): Payer: Self-pay

## 2012-08-11 DIAGNOSIS — Z79899 Other long term (current) drug therapy: Secondary | ICD-10-CM | POA: Insufficient documentation

## 2012-08-11 DIAGNOSIS — Z7982 Long term (current) use of aspirin: Secondary | ICD-10-CM | POA: Insufficient documentation

## 2012-08-11 DIAGNOSIS — I10 Essential (primary) hypertension: Secondary | ICD-10-CM | POA: Insufficient documentation

## 2012-08-11 DIAGNOSIS — J3489 Other specified disorders of nose and nasal sinuses: Secondary | ICD-10-CM | POA: Insufficient documentation

## 2012-08-11 DIAGNOSIS — Z87891 Personal history of nicotine dependence: Secondary | ICD-10-CM | POA: Insufficient documentation

## 2012-08-11 DIAGNOSIS — I739 Peripheral vascular disease, unspecified: Secondary | ICD-10-CM | POA: Insufficient documentation

## 2012-08-11 DIAGNOSIS — Z8709 Personal history of other diseases of the respiratory system: Secondary | ICD-10-CM | POA: Insufficient documentation

## 2012-08-11 DIAGNOSIS — Z862 Personal history of diseases of the blood and blood-forming organs and certain disorders involving the immune mechanism: Secondary | ICD-10-CM | POA: Insufficient documentation

## 2012-08-11 DIAGNOSIS — Z8739 Personal history of other diseases of the musculoskeletal system and connective tissue: Secondary | ICD-10-CM | POA: Insufficient documentation

## 2012-08-11 DIAGNOSIS — E7800 Pure hypercholesterolemia, unspecified: Secondary | ICD-10-CM | POA: Insufficient documentation

## 2012-08-11 DIAGNOSIS — M109 Gout, unspecified: Secondary | ICD-10-CM | POA: Insufficient documentation

## 2012-08-11 DIAGNOSIS — Z7902 Long term (current) use of antithrombotics/antiplatelets: Secondary | ICD-10-CM | POA: Insufficient documentation

## 2012-08-11 DIAGNOSIS — K219 Gastro-esophageal reflux disease without esophagitis: Secondary | ICD-10-CM | POA: Insufficient documentation

## 2012-08-11 DIAGNOSIS — R040 Epistaxis: Secondary | ICD-10-CM

## 2012-08-11 NOTE — ED Notes (Signed)
Pt reports nose bleed started approx 3pm-recently placed on blood thinners and baby ASA after stents in left left on 3/14

## 2012-08-11 NOTE — ED Provider Notes (Signed)
History     CSN: [id number]  Arrival date & time 08/11/12  2018   First MD Initiated Contact with Patient 08/11/12 2040      Chief Complaint  Patient presents with  . Epistaxis    (Consider location/radiation/quality/duration/timing/severity/associated sxs/prior treatment) Patient is a 72 y.o. female presenting with nosebleeds. The history is provided by the patient.  Epistaxis  This is a new problem. The current episode started 6 to 12 hours ago. The problem occurs constantly. The problem has not changed since onset.The problem is associated with anticoagulants. The bleeding has been from the left nare. She has tried applying pressure for the symptoms. The treatment provided no relief. Her past medical history is significant for colds and allergies. Past medical history comments: pt states she has had nosebleeds in the past but always stopped until she started the blood thinner.    Past Medical History  Diagnosis Date  . Peripheral vascular disease   . Hypertension   . Hypercholesteremia   . Exertional shortness of breath   . Pernicious anemia   . GERD (gastroesophageal reflux disease)   . Arthritis     "all over" (07/22/2012)  . Gout     Past Surgical History  Procedure Laterality Date  . Percutaneous stent intervention Left 07/22/2012  . Abdominal hysterectomy  1970's  . Appendectomy  1970's  . Total thyroidectomy  ~ 2010    No family history on file.  History  Substance Use Topics  . Smoking status: Former Smoker -- 0.50 packs/day for 45 years    Types: Cigarettes    Quit date: 05/12/2007  . Smokeless tobacco: Never Used     Comment: 07/22/2012 "stopped smoking ~ 5 yr ago; smoked since my early 20's"  . Alcohol Use: 0.0 oz/week    OB History   Grav Para Term Preterm Abortions TAB SAB Ect Mult Living                  Review of Systems  HENT: Positive for nosebleeds, congestion and rhinorrhea.   All other systems reviewed and are negative.    Allergies   Lactose intolerance (gi)  Home Medications   Current Outpatient Rx  Name  Route  Sig  Dispense  Refill  . allopurinol (ZYLOPRIM) 100 MG tablet   Oral   Take 100 mg by mouth daily.         Marland Kitchen amLODipine (NORVASC) 10 MG tablet   Oral   Take 1 tablet (10 mg total) by mouth daily.   30 tablet   5   . aspirin EC 81 MG tablet   Oral   Take 1 tablet (81 mg total) by mouth daily.         Marland Kitchen atorvastatin (LIPITOR) 40 MG tablet   Oral   Take 40 mg by mouth daily.         . benazepril (LOTENSIN) 40 MG tablet   Oral   Take 40 mg by mouth daily.         . butalbital-acetaminophen-caffeine (FIORICET WITH CODEINE) 50-325-40-30 MG per capsule   Oral   Take 1 capsule by mouth every 4 (four) hours as needed for headache. For pain         . clopidogrel (PLAVIX) 75 MG tablet   Oral   Take 1 tablet (75 mg total) by mouth daily.   30 tablet   11   . ibuprofen (ADVIL,MOTRIN) 200 MG tablet   Oral   Take 200 mg by  mouth every 6 (six) hours as needed for pain. For pain         . nebivolol (BYSTOLIC) 10 MG tablet   Oral   Take 10 mg by mouth daily.         Marland Kitchen omeprazole (PRILOSEC) 40 MG capsule   Oral   Take 40 mg by mouth daily.         Marland Kitchen oxyCODONE-acetaminophen (PERCOCET/ROXICET) 5-325 MG per tablet   Oral   Take 1-2 tablets by mouth every 4 (four) hours as needed for pain.   30 tablet   0     BP 147/80  Pulse 94  Temp(Src) 98.4 F (36.9 C) (Oral)  Resp 20  SpO2 93%  Physical Exam  Nursing note and vitals reviewed. Constitutional: She is oriented to person, place, and time. She appears well-developed and well-nourished. No distress.  HENT:  Head: Normocephalic and atraumatic.  Nose: No septal deviation or nasal septal hematoma. Epistaxis is observed.  No evidence of where the bleeding source is on exam.  Small amt of blood in the posterior pharynx  Eyes: EOM are normal. Pupils are equal, round, and reactive to light.  Cardiovascular: Normal rate.    Pulmonary/Chest: Effort normal.  Neurological: She is alert and oriented to person, place, and time.  Skin: Skin is warm and dry. No rash noted. No erythema.  Psychiatric: She has a normal mood and affect. Her behavior is normal.    ED Course  EPISTAXIS MANAGEMENT Date/Time: 08/11/2012 11:30 PM Performed by: Gwyneth Sprout Authorized by: Gwyneth Sprout Consent: Verbal consent obtained. Risks and benefits: risks, benefits and alternatives were discussed Consent given by: patient Patient understanding: patient states understanding of the procedure being performed Patient sedated: no Treatment site: left anterior Repair method: anterior pack Post-procedure assessment: bleeding stopped Treatment complexity: simple Patient tolerance: Patient tolerated the procedure well with no immediate complications.   (including critical care time)  Labs Reviewed - No data to display No results found.    1. Left-sided epistaxis       MDM   Patient with left-sided anterior epistaxis. She states she's had nosebleeds before but they've always stopped up and around. However she's recently started Plavix and that most likely the cause of the persistent bleeding. On exam unable to identify the area of bleeding. Nasal packing was placed and patient will return in 2 days for removal of packing. No signs concerning for a posterior nose bleed        Gwyneth Sprout, MD 08/11/12 2330

## 2012-08-13 ENCOUNTER — Emergency Department (HOSPITAL_BASED_OUTPATIENT_CLINIC_OR_DEPARTMENT_OTHER)
Admission: EM | Admit: 2012-08-13 | Discharge: 2012-08-13 | Disposition: A | Discharge status: Home / Self Care | Payer: 59 | Attending: Emergency Medicine | Admitting: Emergency Medicine

## 2012-08-13 ENCOUNTER — Encounter (HOSPITAL_BASED_OUTPATIENT_CLINIC_OR_DEPARTMENT_OTHER): Payer: Self-pay

## 2012-08-13 DIAGNOSIS — Z7982 Long term (current) use of aspirin: Secondary | ICD-10-CM | POA: Insufficient documentation

## 2012-08-13 DIAGNOSIS — Z79899 Other long term (current) drug therapy: Secondary | ICD-10-CM | POA: Insufficient documentation

## 2012-08-13 DIAGNOSIS — R040 Epistaxis: Secondary | ICD-10-CM

## 2012-08-13 DIAGNOSIS — Z87891 Personal history of nicotine dependence: Secondary | ICD-10-CM | POA: Insufficient documentation

## 2012-08-13 DIAGNOSIS — I10 Essential (primary) hypertension: Secondary | ICD-10-CM | POA: Insufficient documentation

## 2012-08-13 DIAGNOSIS — I739 Peripheral vascular disease, unspecified: Secondary | ICD-10-CM | POA: Insufficient documentation

## 2012-08-13 DIAGNOSIS — Z4800 Encounter for change or removal of nonsurgical wound dressing: Secondary | ICD-10-CM | POA: Insufficient documentation

## 2012-08-13 NOTE — ED Provider Notes (Signed)
History     CSN: [id number]  Arrival date & time 08/13/12  1110   First MD Initiated Contact with Patient 08/13/12 1122      Chief Complaint  Patient presents with  . Follow-up    (Consider location/radiation/quality/duration/timing/severity/associated sxs/prior treatment) HPI Comments: Patient is here 2 days ago after sustaining a nose bleed. Her left naris was packed with anterior nasal packing and she was told to come back today for the packing removal. She was recently started on Plavix for a stent that was placed in her leg. She's also taking aspirin. She states that she's doing fine with no further episodes of bleeding.   Past Medical History  Diagnosis Date  . Peripheral vascular disease   . Hypertension   . Hypercholesteremia   . Exertional shortness of breath   . Pernicious anemia   . GERD (gastroesophageal reflux disease)   . Arthritis     "all over" (07/22/2012)  . Gout     Past Surgical History  Procedure Laterality Date  . Percutaneous stent intervention Left 07/22/2012  . Abdominal hysterectomy  1970's  . Appendectomy  1970's  . Total thyroidectomy  ~ 2010    History reviewed. No pertinent family history.  History  Substance Use Topics  . Smoking status: Former Smoker -- 0.50 packs/day for 45 years    Types: Cigarettes    Quit date: 05/12/2007  . Smokeless tobacco: Never Used     Comment: 07/22/2012 "stopped smoking ~ 5 yr ago; smoked since my early 20's"  . Alcohol Use: 0.0 oz/week    OB History   Grav Para Term Preterm Abortions TAB SAB Ect Mult Living                  Review of Systems  Constitutional: Negative for fatigue.  HENT: Positive for nosebleeds.   Respiratory: Negative for shortness of breath.   Cardiovascular: Negative for chest pain.  Gastrointestinal: Negative for nausea and vomiting.  Neurological: Negative for dizziness.    Allergies  Lactose intolerance (gi)  Home Medications   Current Outpatient Rx  Name  Route  Sig   Dispense  Refill  . allopurinol (ZYLOPRIM) 100 MG tablet   Oral   Take 100 mg by mouth daily.         Marland Kitchen amLODipine (NORVASC) 10 MG tablet   Oral   Take 1 tablet (10 mg total) by mouth daily.   30 tablet   5   . aspirin EC 81 MG tablet   Oral   Take 1 tablet (81 mg total) by mouth daily.         Marland Kitchen atorvastatin (LIPITOR) 40 MG tablet   Oral   Take 40 mg by mouth daily.         . benazepril (LOTENSIN) 40 MG tablet   Oral   Take 40 mg by mouth daily.         . butalbital-acetaminophen-caffeine (FIORICET WITH CODEINE) 50-325-40-30 MG per capsule   Oral   Take 1 capsule by mouth every 4 (four) hours as needed for headache. For pain         . clopidogrel (PLAVIX) 75 MG tablet   Oral   Take 1 tablet (75 mg total) by mouth daily.   30 tablet   11   . ibuprofen (ADVIL,MOTRIN) 200 MG tablet   Oral   Take 200 mg by mouth every 6 (six) hours as needed for pain. For pain         .  nebivolol (BYSTOLIC) 10 MG tablet   Oral   Take 10 mg by mouth daily.         Marland Kitchen omeprazole (PRILOSEC) 40 MG capsule   Oral   Take 40 mg by mouth daily.         Marland Kitchen oxyCODONE-acetaminophen (PERCOCET/ROXICET) 5-325 MG per tablet   Oral   Take 1-2 tablets by mouth every 4 (four) hours as needed for pain.   30 tablet   0     BP 139/72  Pulse 93  Temp(Src) 98.6 F (37 C) (Oral)  Resp 20  Ht 5\' 2"  (1.575 m)  Wt 133 lb (60.328 kg)  BMI 24.32 kg/m2  SpO2 95%  Physical Exam  Constitutional: She is oriented to person, place, and time. She appears well-developed and well-nourished.  HENT:  Mouth/Throat: Oropharynx is clear and moist.  There is packing present in the left nares  Cardiovascular: Normal rate.   Pulmonary/Chest: Effort normal.  Neurological: She is alert and oriented to person, place, and time.    ED Course  Procedures (including critical care time)  Labs Reviewed - No data to display No results found.   1. Nosebleed       MDM  The balloon was deflated  and the packing was removed here in the emergency room. She had no further episodes of bleeding. There is no bleeding in the posterior pharynx. She was discharged home in good condition was instructed to return if she has further bleeding is not controlled with direct pressure.        Rolan Bucco, MD 08/13/12 [phone]

## 2012-08-13 NOTE — ED Notes (Signed)
Pt states that she is here today for Follow up for nose bleed and packing removal.  Pt presents with Rhino rocket to L nare.

## 2012-09-13 ENCOUNTER — Other Ambulatory Visit (HOSPITAL_COMMUNITY): Payer: Self-pay | Admitting: Cardiovascular Disease

## 2012-09-13 DIAGNOSIS — Z95820 Peripheral vascular angioplasty status with implants and grafts: Secondary | ICD-10-CM

## 2012-09-16 ENCOUNTER — Ambulatory Visit (HOSPITAL_COMMUNITY)
Admission: RE | Admit: 2012-09-16 | Discharge: 2012-09-16 | Disposition: A | Discharge status: Home / Self Care | Payer: 59 | Source: Ambulatory Visit | Attending: Cardiovascular Disease | Admitting: Cardiovascular Disease

## 2012-09-16 DIAGNOSIS — Z95820 Peripheral vascular angioplasty status with implants and grafts: Secondary | ICD-10-CM

## 2013-01-24 ENCOUNTER — Telehealth (HOSPITAL_COMMUNITY): Payer: Self-pay | Admitting: Cardiovascular Disease

## 2013-02-09 ENCOUNTER — Telehealth (HOSPITAL_COMMUNITY): Payer: Self-pay | Admitting: *Deleted

## 2013-02-21 ENCOUNTER — Telehealth (HOSPITAL_COMMUNITY): Payer: Self-pay | Admitting: *Deleted

## 2013-02-23 ENCOUNTER — Other Ambulatory Visit (HOSPITAL_COMMUNITY): Payer: Self-pay | Admitting: Cardiovascular Disease

## 2013-02-23 DIAGNOSIS — I739 Peripheral vascular disease, unspecified: Secondary | ICD-10-CM

## 2013-03-03 ENCOUNTER — Ambulatory Visit (HOSPITAL_COMMUNITY)
Admission: RE | Admit: 2013-03-03 | Discharge: 2013-03-03 | Disposition: A | Discharge status: Home / Self Care | Payer: 59 | Source: Ambulatory Visit | Attending: Cardiovascular Disease | Admitting: Cardiovascular Disease

## 2013-03-03 DIAGNOSIS — I70219 Atherosclerosis of native arteries of extremities with intermittent claudication, unspecified extremity: Secondary | ICD-10-CM

## 2013-03-03 DIAGNOSIS — I739 Peripheral vascular disease, unspecified: Secondary | ICD-10-CM | POA: Insufficient documentation

## 2013-03-03 NOTE — Progress Notes (Signed)
Arterial Duplex Lower Ext. Completed. Willy Vorce, BS, RDMS, RVT  

## 2013-03-12 ENCOUNTER — Encounter: Payer: Self-pay | Admitting: *Deleted

## 2013-03-12 ENCOUNTER — Telehealth: Payer: Self-pay | Admitting: *Deleted

## 2013-03-12 DIAGNOSIS — I739 Peripheral vascular disease, unspecified: Secondary | ICD-10-CM

## 2013-03-12 NOTE — Telephone Encounter (Signed)
Order placed for repeat lower extremity dopplers in 1 year 

## 2013-03-12 NOTE — Telephone Encounter (Signed)
Message copied by Marella Bile on Sun Mar 12, 2013 10:04 PM ------      Message from: Runell Gess      Created: Fri Mar 10, 2013  9:39 AM       Improved ABIs bilaterally. Repeat in 12 months ------

## 2014-02-06 ENCOUNTER — Telehealth (HOSPITAL_COMMUNITY): Payer: Self-pay | Admitting: *Deleted

## 2014-02-14 ENCOUNTER — Telehealth: Payer: Self-pay | Admitting: Cardiovascular Disease

## 2014-02-14 NOTE — Telephone Encounter (Signed)
Pt called in wanting to see Dr. Allyson SabalBerry to see if she needs to have an ultrasound on her rt leg because she said that [facility] and was told that she had a blockage in her rt leg. Please call  Thanks

## 2014-02-14 NOTE — Telephone Encounter (Signed)
Spoke with patient. She is having claudication symptoms. There is a recall in for an annual LEA - due this month. Informed patient I would have scheduler contact her.

## 2014-02-16 ENCOUNTER — Ambulatory Visit (HOSPITAL_COMMUNITY)
Admission: RE | Admit: 2014-02-16 | Discharge: 2014-02-16 | Disposition: A | Discharge status: Home / Self Care | Payer: 59 | Source: Ambulatory Visit | Attending: Cardiology | Admitting: Cardiology

## 2014-02-16 DIAGNOSIS — I739 Peripheral vascular disease, unspecified: Secondary | ICD-10-CM | POA: Diagnosis not present

## 2014-02-16 NOTE — Progress Notes (Signed)
Arterial Duplex Lower Ext. Completed. Ambrosio Reuter, BS, RDMS, RVT  

## 2014-03-06 ENCOUNTER — Encounter: Payer: Self-pay | Admitting: *Deleted

## 2014-04-19 ENCOUNTER — Encounter (HOSPITAL_COMMUNITY): Payer: Self-pay | Admitting: Cardiovascular Disease

## 2015-01-04 ENCOUNTER — Encounter: Payer: Self-pay | Admitting: Cardiovascular Disease

## 2015-03-20 ENCOUNTER — Ambulatory Visit (HOSPITAL_COMMUNITY)
Admission: RE | Admit: 2015-03-20 | Discharge: 2015-03-20 | Disposition: A | Discharge status: Home / Self Care | Payer: 59 | Source: Ambulatory Visit | Attending: Cardiovascular Disease | Admitting: Cardiovascular Disease

## 2015-03-20 ENCOUNTER — Other Ambulatory Visit: Payer: Self-pay | Admitting: Cardiovascular Disease

## 2015-03-20 DIAGNOSIS — I739 Peripheral vascular disease, unspecified: Secondary | ICD-10-CM

## 2015-03-22 ENCOUNTER — Telehealth: Payer: Self-pay | Admitting: Cardiovascular Disease

## 2015-03-22 NOTE — Telephone Encounter (Signed)
Returning your call from yesterday. °

## 2015-03-22 NOTE — Telephone Encounter (Signed)
Left message for patient to call back.  Pt ABI results

## 2015-03-29 NOTE — Telephone Encounter (Signed)
Returned pt called, no VM setup.

## 2015-03-29 NOTE — Telephone Encounter (Signed)
[name] is calling to het her test results please call . Thanks

## 2017-02-19 ENCOUNTER — Telehealth: Payer: Self-pay | Admitting: Cardiovascular Disease

## 2017-02-19 NOTE — Telephone Encounter (Signed)
error 

## 2019-09-24 ENCOUNTER — Emergency Department (HOSPITAL_BASED_OUTPATIENT_CLINIC_OR_DEPARTMENT_OTHER)
Admission: EM | Admit: 2019-09-24 | Discharge: 2019-09-24 | Disposition: A | Discharge status: Home / Self Care | Payer: Medicare Other | Attending: Emergency Medicine | Admitting: Emergency Medicine

## 2019-09-24 ENCOUNTER — Encounter (HOSPITAL_BASED_OUTPATIENT_CLINIC_OR_DEPARTMENT_OTHER): Payer: Self-pay | Admitting: Emergency Medicine

## 2019-09-24 ENCOUNTER — Other Ambulatory Visit: Payer: Self-pay

## 2019-09-24 DIAGNOSIS — Z7902 Long term (current) use of antithrombotics/antiplatelets: Secondary | ICD-10-CM | POA: Insufficient documentation

## 2019-09-24 DIAGNOSIS — Z79899 Other long term (current) drug therapy: Secondary | ICD-10-CM | POA: Insufficient documentation

## 2019-09-24 DIAGNOSIS — E739 Lactose intolerance, unspecified: Secondary | ICD-10-CM | POA: Insufficient documentation

## 2019-09-24 DIAGNOSIS — R040 Epistaxis: Secondary | ICD-10-CM | POA: Diagnosis not present

## 2019-09-24 DIAGNOSIS — Z87891 Personal history of nicotine dependence: Secondary | ICD-10-CM | POA: Insufficient documentation

## 2019-09-24 DIAGNOSIS — I10 Essential (primary) hypertension: Secondary | ICD-10-CM | POA: Diagnosis not present

## 2019-09-24 DIAGNOSIS — E782 Mixed hyperlipidemia: Secondary | ICD-10-CM | POA: Diagnosis not present

## 2019-09-24 DIAGNOSIS — Z7982 Long term (current) use of aspirin: Secondary | ICD-10-CM | POA: Insufficient documentation

## 2019-09-24 MED ORDER — OXYMETAZOLINE HCL 0.05 % NA SOLN
2.0000 | NASAL | Status: DC | PRN
  Administered 2019-09-24: 2 via NASAL

## 2019-09-24 MED ORDER — OXYMETAZOLINE HCL 0.05 % NA SOLN
NASAL | Status: AC
  Filled 2019-09-24: qty 30

## 2019-09-24 MED ORDER — SILVER NITRATE-POT NITRATE 75-25 % EX MISC
CUTANEOUS | Status: AC
  Filled 2019-09-24: qty 10

## 2019-09-24 NOTE — ED Notes (Signed)
Not currently bleeding. Clot visible in left nare.

## 2019-09-24 NOTE — ED Triage Notes (Signed)
Reports nose bleeds on and off for about a week that usually stop but tonight it lasted over an hour.  Does feel like it is slowing down currently.  No longer feels it running down throat.

## 2019-09-24 NOTE — ED Provider Notes (Signed)
[facility] Provider Note: Lowella Dell, MD, FACEP  CSN: [id number] MRN: [id number] ARRIVAL: 09/24/19 at 0050 ROOM: MH01/[facility]01   CHIEF COMPLAINT  Epistaxis   HISTORY OF PRESENT ILLNESS  09/24/19 4:18 AM Holly Guerra is a 79 y.o. female with intermittent nosebleeds on and off for about a week.  They usually stop on their own but this the one that occurred prior to arrival lasted over an hour.  The nosebleeds have been primarily from the left naris but was severe enough earlier to also come out the right naris.  It has subsequently resolved while waiting in the ED and has not recurred.  She denies trauma or pain.    Past Medical History:  Diagnosis Date  . Arthritis    "all over" (07/22/2012)  . Exertional shortness of breath   . GERD (gastroesophageal reflux disease)   . Gout   . Hypercholesteremia   . Hypertension   . Peripheral vascular disease (HCC)   . Pernicious anemia     Past Surgical History:  Procedure Laterality Date  . ABDOMINAL HYSTERECTOMY  1970's  . APPENDECTOMY  1970's  . CARDIOVASCULAR STRESS TEST  06/16/2012   normal myocardial perfusion imaging, LV systolic function was normal without regional wall motion abnormalities, LV EF 60%  . DOPPLER ECHOCARDIOGRAPHY  06/16/2012   EF 60-65%, mild concetric LVH, moderate aortic regurg  . LOWER EXTREMITY ANGIOGRAM N/A 07/22/2012   Procedure: LOWER EXTREMITY ANGIOGRAM;  Surgeon: Runell Gess, MD;  Location: [facility] CATH LAB;  Service: Cardiovascular;  Laterality: N/A;  . LOWER EXTREMITY ARTERIAL DOPPLER  08/08/2012   Left SFA stent-open and patent without evidence of hemodynamically significant stenosis  . PERCUTANEOUS STENT INTERVENTION Left 07/22/2012  . TOTAL THYROIDECTOMY  ~ 2010    No family history on file.  Social History   Tobacco Use  . Smoking status: Former Smoker    Packs/day: 0.50    Years: 45.00    Pack years: 22.50    Types: Cigarettes    Quit date: 05/12/2007    Years since quitting: 12.3    . Smokeless tobacco: Never Used  . Tobacco comment: 07/22/2012 "stopped smoking ~ 5 yr ago; smoked since my early 20's"  Substance Use Topics  . Alcohol use: Yes  . Drug use: No    Prior to Admission medications   Medication Sig Start Date End Date Taking? Authorizing Provider  aspirin EC 81 MG tablet Take 1 tablet (81 mg total) by mouth daily. 07/24/12  Yes Robbie Lis M, PA-C  clopidogrel (PLAVIX) 75 MG tablet Take 1 tablet (75 mg total) by mouth daily. 07/24/12  Yes Robbie Lis M, PA-C  allopurinol (ZYLOPRIM) 100 MG tablet Take 100 mg by mouth daily.    [provider]  amLODipine (NORVASC) 10 MG tablet Take 1 tablet (10 mg total) by mouth daily. 07/24/12   Robbie Lis M, PA-C  atorvastatin (LIPITOR) 40 MG tablet Take 40 mg by mouth daily.    [provider]  benazepril (LOTENSIN) 40 MG tablet Take 40 mg by mouth daily.    [provider]  butalbital-acetaminophen-caffeine (FIORICET WITH CODEINE) 50-325-40-30 MG per capsule Take 1 capsule by mouth every 4 (four) hours as needed for headache. For pain    [provider]  ibuprofen (ADVIL,MOTRIN) 200 MG tablet Take 200 mg by mouth every 6 (six) hours as needed for pain. For pain    [provider]  nebivolol (BYSTOLIC) 10 MG tablet Take 10 mg by mouth  daily.    [provider]  omeprazole (PRILOSEC) 40 MG capsule Take 40 mg by mouth daily.    [provider]  oxyCODONE-acetaminophen (PERCOCET/ROXICET) 5-325 MG per tablet Take 1-2 tablets by mouth every 4 (four) hours as needed for pain. 07/24/12   Consuelo Pandy, PA-C    Allergies Lactose intolerance (gi)   REVIEW OF SYSTEMS  Negative except as noted here or in the History of Present Illness.   PHYSICAL EXAMINATION  Initial Vital Signs Blood pressure (!) 161/78, pulse 79, temperature (!) 97.5 F (36.4 C), temperature source Oral, resp. rate 20, height 5\' 1"  (1.549 m), weight 61.7 kg, SpO2 98  %.  Examination General: Well-developed, well-nourished female in no acute distress; appearance consistent with age of record HENT: normocephalic; atraumatic; dried blood in nares; no bleeding or cauterize supple spots seen Eyes: Normal appearance Neck: supple Heart: regular rate and rhythm Lungs: clear to auscultation bilaterally Abdomen: soft; nondistended; nontender; bowel sounds present Extremities: No deformity; full range of motion; pulses normal Neurologic: Awake, alert and oriented; motor function intact in all extremities and symmetric; no facial droop Skin: Warm and dry Psychiatric: Normal mood and affect   RESULTS  Summary of this visit's results, reviewed and interpreted by myself:   EKG Interpretation  Date/Time:    Ventricular Rate:    PR Interval:    QRS Duration:   QT Interval:    QTC Calculation:   R Axis:     Text Interpretation:        Laboratory Studies: No results found for this or any previous visit (from the past 24 hour(s)). Imaging Studies: No results found.  ED COURSE and MDM  Nursing notes, initial and subsequent vitals signs, including pulse oximetry, reviewed and interpreted by myself.  Vitals:   09/24/19 0114 09/24/19 0115 09/24/19 0327  BP: (!) 143/103  (!) 161/78  Pulse: 83  79  Resp: 18  20  Temp: (!) 97.5 F (36.4 C)    TempSrc: Oral    SpO2: 100%  98%  Weight:  61.7 kg   Height:  5\' 1"  (1.549 m)    Medications  oxymetazoline (AFRIN) 0.05 % nasal spray (has no administration in time range)  oxymetazoline (AFRIN) 0.05 % nasal spray 2 spray (has no administration in time range)   Patient provided with Afrin for use as needed.  She has an ENT with whom she can follow-up if recurrence continues.  No active bleeding in the ED.   PROCEDURES  Procedures   ED DIAGNOSES     ICD-10-CM   1. Recurrent epistaxis  R04.0        Aniyha Tate, Jenny Reichmann, MD 09/24/19 [phone]

## 2020-03-20 ENCOUNTER — Encounter: Payer: Self-pay | Admitting: Cardiovascular Disease

## 2020-03-20 ENCOUNTER — Other Ambulatory Visit: Payer: Self-pay

## 2020-03-20 ENCOUNTER — Ambulatory Visit: Payer: Medicare Other | Admitting: Cardiovascular Disease

## 2020-03-20 VITALS — BP 142/68 | HR 107 | Ht 62.0 in | Wt 126.4 lb

## 2020-03-20 DIAGNOSIS — I10 Essential (primary) hypertension: Secondary | ICD-10-CM | POA: Diagnosis not present

## 2020-03-20 DIAGNOSIS — E785 Hyperlipidemia, unspecified: Secondary | ICD-10-CM

## 2020-03-20 DIAGNOSIS — I739 Peripheral vascular disease, unspecified: Secondary | ICD-10-CM

## 2020-03-20 NOTE — Addendum Note (Signed)
Addended by: Barrie Dunker on: 03/20/2020 03:32 PM   Modules accepted: Orders

## 2020-03-20 NOTE — Assessment & Plan Note (Signed)
Holly Guerra was referred back to me by Arnette Felts, PA-C for evaluation of PAD.  I performed PTA and stenting of a left SFA CTO 07/22/2012 with an excellent result.  She did have moderate disease on the right.  She had one-vessel runoff bilaterally.  Her claudication on the left resolved.  She was apparently sent to Dr. Imogene Burn, vascular surgeon, at [facility] who attempted to perform percutaneous revascularization of her right SFA this past July unsuccessfully.  She is referred back to me for lifestyle limiting claudication.  I am going to get lower extremity arterial Doppler studies and the operative note from [facility] prior to making decision with regard to reattempt at intervention.

## 2020-03-20 NOTE — Patient Instructions (Signed)
Medication Instructions:  Continue current medications  *If you need a refill on your cardiac medications before your next appointment, please call your pharmacy*   Lab Work: None Ordered   Testing/Procedures: Your physician has requested that you have a lower extremity arterial duplex. This test is an ultrasound of the arteries in the legs or arms. It looks at arterial blood flow in the legs and arms. Allow one hour for Lower and Upper Arterial scans. There are no restrictions or special instructions    Follow-Up: At [facility], you and your health needs are our priority.  As part of our continuing mission to provide you with exceptional heart care, we have created designated Provider Care Teams.  These Care Teams include your primary Cardiologist (physician) and Advanced Practice Providers (APPs -  Physician Assistants and Nurse Practitioners) who all work together to provide you with the care you need, when you need it.  We recommend signing up for the patient portal called "MyChart".  Sign up information is provided on this After Visit Summary.  MyChart is used to connect with patients for Virtual Visits (Telemedicine).  Patients are able to view lab/test results, encounter notes, upcoming appointments, etc.  Non-urgent messages can be sent to your provider as well.   To learn more about what you can do with MyChart, go to [email or web address].    Your next appointment:   1 month(s)   The format for your next appointment:   In Person  Provider:   You may see Nanetta Batty, MD or one of the following Advanced Practice Providers on your designated Care Team:

## 2020-03-20 NOTE — Progress Notes (Signed)
03/20/2020 Manfred Shirts   01/09/1941  [id number]  Primary Physician Coralee Rud, PA-C Primary Cardiologist: Runell Gess MD Nicholes Calamity, [place]  HPI:  Holly Guerra is a 79 y.o. thin appearing widowed African-American female mother of 2 children, grandmother of 5 grandchildren who is retired from working at [employer].  He sent to me intervened on her left SFA 6 years ago 7 years ago and for some reason she was referred to Dr.Shanahan who unsuccessfully intervened on her right SFA.  She does have a history of tobacco abuse, hypertension, hyperlipidemia and mitral valve disease.  She has right calf lifestyle limiting claudication.  Her left calf is felt good since I intervened 07/22/2012 of a left SFA CTO.  I am going to obtain lower extremity arterial Doppler studies on her, review her op note from [facility] and decide whether or not to proceed with reattempt at right lower extremity revascularization.  Current Meds  Medication Sig  . allopurinol (ZYLOPRIM) 100 MG tablet Take 100 mg by mouth daily.  Marland Kitchen amLODipine (NORVASC) 10 MG tablet Take 1 tablet (10 mg total) by mouth daily.  Marland Kitchen aspirin EC 81 MG tablet Take 1 tablet (81 mg total) by mouth daily.  Marland Kitchen atorvastatin (LIPITOR) 40 MG tablet Take 40 mg by mouth daily.  Marland Kitchen azelastine (ASTELIN) 0.1 % nasal spray Place 2 sprays into both nostrils 2 (two) times daily. Use in each nostril as directed  . azelastine (OPTIVAR) 0.05 % ophthalmic solution 1 drop 2 (two) times daily.  . benazepril (LOTENSIN) 40 MG tablet Take 40 mg by mouth daily.  . butalbital-acetaminophen-caffeine (FIORICET WITH CODEINE) 50-325-40-30 MG per capsule Take 1 capsule by mouth every 4 (four) hours as needed for headache. For pain  . cilostazol (PLETAL) 100 MG tablet Take 100 mg by mouth 2 (two) times daily.  . clopidogrel (PLAVIX) 75 MG tablet Take 1 tablet (75 mg total) by mouth daily.  . cyclobenzaprine (FLEXERIL) 10 MG tablet Take 10 mg by mouth at bedtime. 10 MG  TABLET, 1 ORAL AT BEDTIME  . Fenofibric Acid 105 MG TABS Take 105 mg by mouth 1 day or 1 dose. 1 TABLET DAILY 105MG   . fluticasone (FLONASE) 50 MCG/ACT nasal spray Place 2 sprays into both nostrils daily. 2 SPRAYS NASAL DAILY  . nebivolol (BYSTOLIC) 10 MG tablet Take 10 mg by mouth daily.  omeprazole (PRILOSEC) 40 MG capsule Take 40 mg by mouth daily.  . pantoprazole (PROTONIX) 40 MG tablet Take 40 mg by mouth daily.  . sucralfate (CARAFATE) 1 g tablet Take 1 g by mouth 4 (four) times daily. 1 TABLET FOUR TIMES DAILY  . Vitamin D, Ergocalciferol, (DRISDOL) 1.25 MG (50000 UNIT) CAPS capsule Take 50,000 Units by mouth once a week.     Allergies  Allergen Reactions  . Lactose Intolerance (Gi) Diarrhea    Social History   Socioeconomic History  . Marital status: Widowed    Spouse name: Not on file  . Number of children: Not on file  . Years of education: Not on file  . Highest education level: Not on file  Occupational History  . Not on file  Tobacco Use  . Smoking status: Former Smoker    Packs/day: 0.50    Years: 45.00    Pack years: 22.50    Types: Cigarettes    Quit date: 05/12/2007    Years since quitting: 12.8  . Smokeless tobacco: Never Used  . Tobacco comment: 07/22/2012 "stopped  smoking ~ 5 yr ago; smoked since my early 20's"  Substance and Sexual Activity  . Alcohol use: Yes  . Drug use: No  . Sexual activity: Not on file  Other Topics Concern  . Not on file  Social History Narrative  . Not on file   Social Determinants of Health   Financial Resource Strain:   . Difficulty of Paying Living Expenses: Not on file  Food Insecurity:   . Worried About Programme researcher, broadcasting/film/video in the Last Year: Not on file  . Ran Out of Food in the Last Year: Not on file  Transportation Needs:   . Lack of Transportation (Medical): Not on file  . Lack of Transportation (Non-Medical): Not on file  Physical Activity:   . Days of Exercise per Week: Not on file  . Minutes of Exercise per  Session: Not on file  Stress:   . Feeling of Stress : Not on file  Social Connections:   . Frequency of Communication with Friends and Family: Not on file  . Frequency of Social Gatherings with Friends and Family: Not on file  . Attends Religious Services: Not on file  . Active Member of Clubs or Organizations: Not on file  . Attends Banker Meetings: Not on file  . Marital Status: Not on file  Intimate Partner Violence:   . Fear of Current or Ex-Partner: Not on file  . Emotionally Abused: Not on file  . Physically Abused: Not on file  . Sexually Abused: Not on file     Review of Systems: General: negative for chills, fever, night sweats or weight changes.  Cardiovascular: negative for chest pain, dyspnea on exertion, edema, orthopnea, palpitations, paroxysmal nocturnal dyspnea or shortness of breath Dermatological: negative for rash Respiratory: negative for cough or wheezing Urologic: negative for hematuria Abdominal: negative for nausea, vomiting, diarrhea, bright red blood per rectum, melena, or hematemesis Neurologic: negative for visual changes, syncope, or dizziness All other systems reviewed and are otherwise negative except as noted above.    Blood pressure (!) 142/68, pulse (!) 107, height 5\' 2"  (1.575 m), weight 126 lb 6.4 oz (57.3 kg).  General appearance: alert and no distress Neck: no adenopathy, no JVD, supple, symmetrical, trachea midline, thyroid not enlarged, symmetric, no tenderness/mass/nodules and Soft bilateral carotid bruits Lungs: clear to auscultation bilaterally Heart: 2/6 apical cyst systolic murmur as well as diastolic murmur Extremities: extremities normal, atraumatic, no cyanosis or edema Pulses: Right Subclavian absent Absent pedal pulses bilaterally Skin: Skin color, texture, turgor normal. No rashes or lesions Neurologic: Alert and oriented X 3, normal strength and tone. Normal symmetric reflexes. Normal coordination and gait  EKG  sinus tachycardia at 107 with frequent PACs and nonspecific ST and T wave changes.  I personally reviewed this EKG.  ASSESSMENT AND PLAN:   PAD- S/P PTA and stenting of left SFA 07/22/12 Ms. Baby was referred back to me by 07/24/12, PA-C for evaluation of PAD.  I performed PTA and stenting of a left SFA CTO 07/22/2012 with an excellent result.  She did have moderate disease on the right.  She had one-vessel runoff bilaterally.  Her claudication on the left resolved.  She was apparently sent to Dr. 07/24/2012, vascular surgeon, at [facility] who attempted to perform percutaneous revascularization of her right SFA this past July unsuccessfully.  She is referred back to me for lifestyle limiting claudication.  I am going to get lower extremity arterial Doppler studies and  the operative note from [facility] prior to making decision with regard to reattempt at intervention.      Runell Gess MD FACP,FACC,FAHA, FSCAI 03/20/2020 1:40 PM

## 2020-03-25 ENCOUNTER — Other Ambulatory Visit: Payer: Self-pay | Admitting: Cardiovascular Disease

## 2020-03-25 DIAGNOSIS — I739 Peripheral vascular disease, unspecified: Secondary | ICD-10-CM

## 2020-04-08 ENCOUNTER — Other Ambulatory Visit: Payer: Self-pay

## 2020-04-08 ENCOUNTER — Ambulatory Visit (HOSPITAL_COMMUNITY)
Admission: RE | Admit: 2020-04-08 | Discharge: 2020-04-08 | Disposition: A | Discharge status: Home / Self Care | Payer: Medicare Other | Source: Ambulatory Visit | Attending: Cardiology | Admitting: Cardiology

## 2020-04-08 DIAGNOSIS — I739 Peripheral vascular disease, unspecified: Secondary | ICD-10-CM | POA: Insufficient documentation

## 2020-04-16 ENCOUNTER — Encounter: Payer: Self-pay | Admitting: Cardiovascular Disease

## 2020-04-16 ENCOUNTER — Ambulatory Visit: Payer: Medicare Other | Admitting: Cardiovascular Disease

## 2020-04-16 ENCOUNTER — Other Ambulatory Visit: Payer: Self-pay

## 2020-04-16 VITALS — BP 138/62 | HR 62 | Ht 62.0 in | Wt 123.8 lb

## 2020-04-16 DIAGNOSIS — I739 Peripheral vascular disease, unspecified: Secondary | ICD-10-CM | POA: Diagnosis not present

## 2020-04-16 DIAGNOSIS — Z87891 Personal history of nicotine dependence: Secondary | ICD-10-CM

## 2020-04-16 NOTE — Progress Notes (Signed)
04/16/2020 Manfred Shirts   Jul 22, 1940  [id number]  Primary Physician Coralee Rud, PA-C Primary Cardiologist: Runell Gess MD Nicholes Calamity, [place]  HPI:  Holly Guerra is a 79 y.o.  thin appearing widowed African-American female mother of 2 children, grandmother of 5 grandchildren who is retired from working at [employer] .  She was referred to Dr.Shanahan who unsuccessfully intervened on her right SFA 08/01/19.  She does have a history of tobacco abuse, hypertension, hyperlipidemia and mitral valve disease.  She has right calf lifestyle limiting claudication.  Her left calf is felt good since I intervened 07/22/2012 of a left SFA CTO.  I am going to obtain lower extremity arterial Doppler studies on her, review her op note from [facility] and decide whether or not to proceed with reattempt at right lower extremity revascularization.   I obtain lower extremity Dopplers on her 04/08/2020 revealing a right ABI of 0.51 and a left of 1.08.  She did appear to have a high-frequency signal in her distal left common femoral artery.  Her left SFA stent was patent.  Her right SFA was occluded and she had one-vessel runoff via peroneal.  Dr. Willette Pa did recommend femoropopliteal bypass grafting after failed attempt at right SFA percutaneous revascularization.  The patient has been on Pletal without benefit.  She wishes me to proceed with reattempt at right SFA intervention.   Current Meds  Medication Sig  . allopurinol (ZYLOPRIM) 100 MG tablet Take 100 mg by mouth daily.  Marland Kitchen amLODipine (NORVASC) 10 MG tablet Take 1 tablet (10 mg total) by mouth daily.  Marland Kitchen aspirin EC 81 MG tablet Take 1 tablet (81 mg total) by mouth daily.  Marland Kitchen atorvastatin (LIPITOR) 80 MG tablet Take 80 mg by mouth daily.  Marland Kitchen azelastine (ASTELIN) 0.1 % nasal spray Place 2 sprays into both nostrils 2 (two) times daily. Use in each nostril as directed  . azelastine (OPTIVAR) 0.05 % ophthalmic solution 1 drop 2 (two) times daily.  .  benazepril (LOTENSIN) 40 MG tablet Take 40 mg by mouth daily.  . butalbital-acetaminophen-caffeine (FIORICET WITH CODEINE) 50-325-40-30 MG per capsule Take 1 capsule by mouth every 4 (four) hours as needed for headache. For pain  . cilostazol (PLETAL) 100 MG tablet Take 100 mg by mouth 2 (two) times daily.  . clopidogrel (PLAVIX) 75 MG tablet Take 1 tablet (75 mg total) by mouth daily.  . cyanocobalamin (,VITAMIN B-12,) 1000 MCG/ML injection Inject into the muscle.  . cyclobenzaprine (FLEXERIL) 10 MG tablet Take 10 mg by mouth at bedtime. 10 MG TABLET, 1 ORAL AT BEDTIME  . Fenofibric Acid 105 MG TABS Take 105 mg by mouth 1 day or 1 dose. 1 TABLET DAILY 105MG   . ferrous sulfate 325 (65 FE) MG tablet Take by mouth.  . fluticasone (FLONASE) 50 MCG/ACT nasal spray Place 2 sprays into both nostrils daily. 2 SPRAYS NASAL DAILY  . nebivolol (BYSTOLIC) 10 MG tablet Take 10 mg by mouth daily.  omeprazole (PRILOSEC) 40 MG capsule Take 40 mg by mouth daily.  . pantoprazole (PROTONIX) 40 MG tablet Take 40 mg by mouth daily.  . pregabalin (LYRICA) 50 MG capsule Take by mouth at bedtime.  . simvastatin (ZOCOR) 40 MG tablet Take by mouth.  . sucralfate (CARAFATE) 1 g tablet Take 1 g by mouth 4 (four) times daily. 1 TABLET FOUR TIMES DAILY  . traMADol (ULTRAM) 50 MG tablet Take 1 tablet by mouth 3 (three) times daily.  Marland Kitchen  Vitamin D, Ergocalciferol, (DRISDOL) 1.25 MG (50000 UNIT) CAPS capsule Take 50,000 Units by mouth once a week.  . [DISCONTINUED] atorvastatin (LIPITOR) 40 MG tablet Take 40 mg by mouth daily.     Allergies  Allergen Reactions  . Lactose Intolerance (Gi) Diarrhea    Social History   Socioeconomic History  . Marital status: Widowed    Spouse name: Not on file  . Number of children: Not on file  . Years of education: Not on file  . Highest education level: Not on file  Occupational History  . Not on file  Tobacco Use  . Smoking status: Former Smoker    Packs/day: 0.50    Years:  45.00    Pack years: 22.50    Types: Cigarettes    Quit date: 05/12/2007    Years since quitting: 12.9  . Smokeless tobacco: Never Used  . Tobacco comment: 07/22/2012 "stopped smoking ~ 5 yr ago; smoked since my early 20's"  Substance and Sexual Activity  . Alcohol use: Yes  . Drug use: No  . Sexual activity: Not on file  Other Topics Concern  . Not on file  Social History Narrative  . Not on file   Social Determinants of Health   Financial Resource Strain:   . Difficulty of Paying Living Expenses: Not on file  Food Insecurity:   . Worried About Programme researcher, broadcasting/film/video in the Last Year: Not on file  . Ran Out of Food in the Last Year: Not on file  Transportation Needs:   . Lack of Transportation (Medical): Not on file  . Lack of Transportation (Non-Medical): Not on file  Physical Activity:   . Days of Exercise per Week: Not on file  . Minutes of Exercise per Session: Not on file  Stress:   . Feeling of Stress : Not on file  Social Connections:   . Frequency of Communication with Friends and Family: Not on file  . Frequency of Social Gatherings with Friends and Family: Not on file  . Attends Religious Services: Not on file  . Active Member of Clubs or Organizations: Not on file  . Attends Banker Meetings: Not on file  . Marital Status: Not on file  Intimate Partner Violence:   . Fear of Current or Ex-Partner: Not on file  . Emotionally Abused: Not on file  . Physically Abused: Not on file  . Sexually Abused: Not on file     Review of Systems: General: negative for chills, fever, night sweats or weight changes.  Cardiovascular: negative for chest pain, dyspnea on exertion, edema, orthopnea, palpitations, paroxysmal nocturnal dyspnea or shortness of breath Dermatological: negative for rash Respiratory: negative for cough or wheezing Urologic: negative for hematuria Abdominal: negative for nausea, vomiting, diarrhea, bright red blood per rectum, melena, or  hematemesis Neurologic: negative for visual changes, syncope, or dizziness All other systems reviewed and are otherwise negative except as noted above.    Blood pressure 138/62, pulse 62, height 5\' 2"  (1.575 m), weight 123 lb 12.8 oz (56.2 kg).  General appearance: alert and no distress Neck: no adenopathy, no carotid bruit, no JVD, supple, symmetrical, trachea midline and thyroid not enlarged, symmetric, no tenderness/mass/nodules Lungs: clear to auscultation bilaterally Heart: regular rate and rhythm, S1, S2 normal, no murmur, click, rub or gallop Extremities: extremities normal, atraumatic, no cyanosis or edema Pulses: 2+ and symmetric Diminished pedal pulses Skin: Skin color, texture, turgor normal. No rashes or lesions Neurologic: Alert and oriented X 3,  normal strength and tone. Normal symmetric reflexes. Normal coordination and gait  EKG not performed today  ASSESSMENT AND PLAN:   PAD- S/P PTA and stenting of left SFA 07/22/12 Holly Guerra returns today for follow-up.  She had failed attempt at right SFA intervention by Dr. Willette Pa at  [facility] 08/01/2019.  She has been on Pletal without benefit.  Bypass surgery was recommended however the patient has not pursued this.  She has one-vessel runoff via peroneal.  I did perform PTA and stenting of a left SFA CTO on her 07/22/2012.  Her claudication is lifestyle limiting and she wishes me to proceed with attempt at right SFA intervention.      Runell Gess MD FACP,FACC,FAHA, [facility] 04/16/2020 2:46 PM

## 2020-04-16 NOTE — Assessment & Plan Note (Signed)
Ms. Eustache returns today for follow-up.  She had failed attempt at right SFA intervention by Dr. Willette Pa at  [facility] 08/01/2019.  She has been on Pletal without benefit.  Bypass surgery was recommended however the patient has not pursued this.  She has one-vessel runoff via peroneal.  I did perform PTA and stenting of a left SFA CTO on her 07/22/2012.  Her claudication is lifestyle limiting and she wishes me to proceed with attempt at right SFA intervention.

## 2020-04-16 NOTE — Patient Instructions (Signed)
    [facility] CARDIOVASCULAR DIVISION [facility] [address] [place] [address] [place] [address] Dept: [phone] Loc: [phone]  Holly Guerra  04/16/2020  You are scheduled for a Peripheral Angiogram on Thursday, January 13 with Dr. Nanetta Batty.  1. Please arrive at the [facility] (Main Entrance A) at [facility]: [address] [place], [place] [address] at 7:30 AM (This time is two hours before your procedure to ensure your preparation). Free valet parking service is available.   Special note: Every effort is made to have your procedure done on time. Please understand that emergencies sometimes delay scheduled procedures.  2. Diet: Do not eat solid foods after midnight.  The patient may have clear liquids until 5am upon the day of the procedure.  3. Labs: You will need to have blood drawn in the next 2-3 weeks. You do not need to be fasting.  4. Medication instructions in preparation for your procedure:  On the morning of your procedure, take your Aspirin and Plavix/Clopidogrel and any morning medicines NOT listed above.  You may use sips of water.  5. Plan for one night stay--bring personal belongings. 6. Bring a current list of your medications and current insurance cards. 7. You MUST have a responsible person to drive you home. 8. Someone MUST be with you the first 24 hours after you arrive home or your discharge will be delayed. 9. Please wear clothes that are easy to get on and off and wear slip-on shoes.  Thank you for allowing [place] to care for you!   -- [place] [facility]  You will need a COVID-19  test prior to your procedure. You are scheduled for Wed. 05/22/20  At 8:05am . This is a Drive Up Visit at [address] [place], [place] [address]. Someone will direct you to the appropriate testing line. Stay in your car and someone will be with you shortly.  You will need a follow up lower  extremity vascular ultrasound 1 week after your procedure.  You will need follow up appointment with Dr. Allyson Sabal 2 weeks after your procedure.

## 2020-04-17 ENCOUNTER — Other Ambulatory Visit: Payer: Self-pay

## 2020-04-17 DIAGNOSIS — I739 Peripheral vascular disease, unspecified: Secondary | ICD-10-CM

## 2020-04-17 MED ORDER — SODIUM CHLORIDE 0.9% FLUSH: 3.0000 mL | Freq: Two times a day (BID) | INTRAVENOUS | Status: DC

## 2020-05-20 ENCOUNTER — Telehealth: Payer: Self-pay | Admitting: *Deleted

## 2020-05-20 NOTE — Telephone Encounter (Addendum)
Needs BMP/CBC pre-procedure 05/23/20-call placed to patient to discuss, no answer, LMTCB

## 2020-05-20 NOTE — Telephone Encounter (Signed)
Pt is going to [facility] tomorrow morning between 8 AM-12 Noon for BMP/CBC, then will get pre-procedure COVID-19 at [facility] location 11:25 AM.

## 2020-05-21 ENCOUNTER — Other Ambulatory Visit (HOSPITAL_COMMUNITY)
Admission: RE | Admit: 2020-05-21 | Discharge: 2020-05-21 | Disposition: A | Discharge status: Home / Self Care | Payer: Medicare Other | Source: Ambulatory Visit | Attending: Cardiovascular Disease | Admitting: Cardiovascular Disease

## 2020-05-21 DIAGNOSIS — Z01812 Encounter for preprocedural laboratory examination: Secondary | ICD-10-CM | POA: Diagnosis present

## 2020-05-21 DIAGNOSIS — Z20822 Contact with and (suspected) exposure to covid-19: Secondary | ICD-10-CM | POA: Diagnosis not present

## 2020-05-21 LAB — CBC
Hematocrit: 37 % (ref 34.0–46.6)
Hemoglobin: 10.7 g/dL — ABNORMAL LOW (ref 11.1–15.9)
MCH: 20.8 pg — ABNORMAL LOW (ref 26.6–33.0)
MCHC: 28.9 g/dL — ABNORMAL LOW (ref 31.5–35.7)
MCV: 72 fL — ABNORMAL LOW (ref 79–97)
Platelets: 367 10*3/uL (ref 150–450)
RBC: 5.14 x10E6/uL (ref 3.77–5.28)
RDW: 21 % — ABNORMAL HIGH (ref 11.7–15.4)
WBC: 5.7 10*3/uL (ref 3.4–10.8)

## 2020-05-21 LAB — BASIC METABOLIC PANEL
BUN/Creatinine Ratio: 9 — ABNORMAL LOW (ref 12–28)
BUN: 8 mg/dL (ref 8–27)
CO2: 21 mmol/L (ref 20–29)
Calcium: 10.4 mg/dL — ABNORMAL HIGH (ref 8.7–10.3)
Chloride: 100 mmol/L (ref 96–106)
Creatinine, Ser: 0.87 mg/dL (ref 0.57–1.00)
GFR calc Af Amer: 74 mL/min/{1.73_m2} (ref >59)
GFR calc non Af Amer: 64 mL/min/{1.73_m2} (ref >59)
Glucose: 106 mg/dL — ABNORMAL HIGH (ref 65–99)
Potassium: 4.3 mmol/L (ref 3.5–5.2)
Sodium: 137 mmol/L (ref 134–144)

## 2020-05-21 LAB — SARS CORONAVIRUS 2 (TAT 6-24 HRS): SARS Coronavirus 2: NEGATIVE

## 2020-05-22 ENCOUNTER — Other Ambulatory Visit (HOSPITAL_COMMUNITY): Payer: Medicare Other

## 2020-05-22 NOTE — Telephone Encounter (Signed)
Pt contacted pre-catheterization scheduled at [facility] for: Thursday May 23, 2020 9:30 AM Verified arrival time and place: [facility] Main Entrance A [facility]) at: 7:30 AM   No solid food after midnight prior to cath, clear liquids until 5 AM day of procedure.  AM meds can be  taken pre-cath with sips of water including: ASA 81 mg Plavix 75 mg  Confirmed patient has responsible adult to drive home post procedure and be with patient first 24 hours after arriving home: yes  You are allowed ONE visitor in the waiting room during the time you are at the hospital for your procedure. Both you and your visitor must wear a mask once you enter the hospital.  Reviewed procedure/mask/visitor instructions with patient.

## 2020-05-23 ENCOUNTER — Encounter (HOSPITAL_COMMUNITY)
Admission: RE | Disposition: A | Discharge status: Home / Self Care | Payer: Self-pay | Source: Home / Self Care | Attending: Cardiovascular Disease

## 2020-05-23 ENCOUNTER — Ambulatory Visit (HOSPITAL_COMMUNITY)
Admission: RE | Admit: 2020-05-23 | Discharge: 2020-05-23 | Disposition: A | Discharge status: Home / Self Care | Payer: Medicare Other | Attending: Cardiovascular Disease | Admitting: Cardiovascular Disease

## 2020-05-23 DIAGNOSIS — I739 Peripheral vascular disease, unspecified: Secondary | ICD-10-CM | POA: Diagnosis not present

## 2020-05-23 DIAGNOSIS — I70213 Atherosclerosis of native arteries of extremities with intermittent claudication, bilateral legs: Secondary | ICD-10-CM

## 2020-05-23 DIAGNOSIS — Z79899 Other long term (current) drug therapy: Secondary | ICD-10-CM | POA: Insufficient documentation

## 2020-05-23 DIAGNOSIS — I10 Essential (primary) hypertension: Secondary | ICD-10-CM | POA: Insufficient documentation

## 2020-05-23 DIAGNOSIS — Z87891 Personal history of nicotine dependence: Secondary | ICD-10-CM | POA: Insufficient documentation

## 2020-05-23 DIAGNOSIS — Z7902 Long term (current) use of antithrombotics/antiplatelets: Secondary | ICD-10-CM | POA: Diagnosis not present

## 2020-05-23 DIAGNOSIS — Z7982 Long term (current) use of aspirin: Secondary | ICD-10-CM | POA: Insufficient documentation

## 2020-05-23 DIAGNOSIS — E785 Hyperlipidemia, unspecified: Secondary | ICD-10-CM | POA: Diagnosis not present

## 2020-05-23 HISTORY — PX: ABDOMINAL AORTOGRAM W/LOWER EXTREMITY: CATH118223

## 2020-05-23 SURGERY — ABDOMINAL AORTOGRAM W/LOWER EXTREMITY
Anesthesia: LOCAL | Laterality: Bilateral

## 2020-05-23 MED ORDER — ASPIRIN 81 MG PO CHEW: 81.0000 mg | CHEWABLE_TABLET | ORAL | Status: DC

## 2020-05-23 MED ORDER — ATORVASTATIN CALCIUM 80 MG PO TABS
80.0000 mg | ORAL_TABLET | Freq: Every day | ORAL | Status: DC
  Filled 2020-05-23 (×2): qty 1

## 2020-05-23 MED ORDER — SODIUM CHLORIDE 0.9% FLUSH: 3.0000 mL | Freq: Two times a day (BID) | INTRAVENOUS | Status: DC

## 2020-05-23 MED ORDER — LIDOCAINE HCL (PF) 1 % IJ SOLN
INTRAMUSCULAR | Status: DC | PRN
  Administered 2020-05-23: 18 mL

## 2020-05-23 MED ORDER — SODIUM CHLORIDE 0.9 % WEIGHT BASED INFUSION
3.0000 mL/kg/h | INTRAVENOUS | Status: AC
  Administered 2020-05-23: 3 mL/kg/h via INTRAVENOUS

## 2020-05-23 MED ORDER — ONDANSETRON HCL 4 MG/2ML IJ SOLN: 4.0000 mg | Freq: Four times a day (QID) | INTRAMUSCULAR | Status: DC | PRN

## 2020-05-23 MED ORDER — SODIUM CHLORIDE 0.9 % IV SOLN: 250.0000 mL | INTRAVENOUS | Status: DC | PRN

## 2020-05-23 MED ORDER — SODIUM CHLORIDE 0.9% FLUSH: 3.0000 mL | INTRAVENOUS | Status: DC | PRN

## 2020-05-23 MED ORDER — HEPARIN (PORCINE) IN NACL 1000-0.9 UT/500ML-% IV SOLN
INTRAVENOUS | Status: AC
  Filled 2020-05-23: qty 1000

## 2020-05-23 MED ORDER — ACETAMINOPHEN 325 MG PO TABS: 650.0000 mg | ORAL_TABLET | ORAL | Status: DC | PRN

## 2020-05-23 MED ORDER — HYDRALAZINE HCL 20 MG/ML IJ SOLN: 5.0000 mg | INTRAMUSCULAR | Status: DC | PRN

## 2020-05-23 MED ORDER — SODIUM CHLORIDE 0.9 % WEIGHT BASED INFUSION: 1.0000 mL/kg/h | INTRAVENOUS | Status: DC

## 2020-05-23 MED ORDER — SODIUM CHLORIDE 0.9 % IV SOLN: INTRAVENOUS | Status: DC

## 2020-05-23 MED ORDER — ASPIRIN EC 81 MG PO TBEC: 81.0000 mg | DELAYED_RELEASE_TABLET | Freq: Every day | ORAL | Status: DC

## 2020-05-23 MED ORDER — HEPARIN (PORCINE) IN NACL 1000-0.9 UT/500ML-% IV SOLN
INTRAVENOUS | Status: DC | PRN
  Administered 2020-05-23 (×2): 500 mL

## 2020-05-23 MED ORDER — LABETALOL HCL 5 MG/ML IV SOLN
10.0000 mg | INTRAVENOUS | Status: DC | PRN
  Administered 2020-05-23: 10 mg via INTRAVENOUS

## 2020-05-23 MED ORDER — CLOPIDOGREL BISULFATE 75 MG PO TABS: 75.0000 mg | ORAL_TABLET | Freq: Every day | ORAL | Status: DC

## 2020-05-23 MED ORDER — LIDOCAINE HCL (PF) 1 % IJ SOLN
INTRAMUSCULAR | Status: AC
  Filled 2020-05-23: qty 30

## 2020-05-23 MED ORDER — LABETALOL HCL 5 MG/ML IV SOLN
INTRAVENOUS | Status: AC
  Filled 2020-05-23: qty 4

## 2020-05-23 MED ORDER — IODIXANOL 320 MG/ML IV SOLN
INTRAVENOUS | Status: DC | PRN
  Administered 2020-05-23: 125 mL

## 2020-05-23 MED ORDER — MORPHINE SULFATE (PF) 2 MG/ML IV SOLN: 2.0000 mg | INTRAVENOUS | Status: DC | PRN

## 2020-05-23 SURGICAL SUPPLY — 10 items
CATH ANGIO 5F PIGTAIL 65CM (CATHETERS) ×2 IMPLANT
KIT PV (KITS) ×2 IMPLANT
SHEATH PINNACLE 5F 10CM (SHEATH) ×2 IMPLANT
SHEATH PROBE COVER 6X72 (BAG) ×2 IMPLANT
STOPCOCK MORSE 400PSI 3WAY (MISCELLANEOUS) ×2 IMPLANT
SYR MEDRAD MARK 7 150ML (SYRINGE) ×2 IMPLANT
TRANSDUCER W/STOPCOCK (MISCELLANEOUS) ×2 IMPLANT
TRAY PV CATH (CUSTOM PROCEDURE TRAY) ×2 IMPLANT
TUBING CIL FLEX 10 FLL-RA (TUBING) ×2 IMPLANT
WIRE HITORQ VERSACORE ST 145CM (WIRE) ×4 IMPLANT

## 2020-05-23 NOTE — Progress Notes (Signed)
1410 patient ambulated in hall . Left groin dressing CDI.

## 2020-05-23 NOTE — Progress Notes (Signed)
SITE AREA:  Left femoral  SITE PRIOR TO REMOVAL:  LEVEL 0  PRESSURE APPLIED FOR: approximately 20 minutes  MANUAL: Yes  PATIENT STATUS DURING PULL: wnl, alert, oriented  POST PULL SITE:  LEVEL 0  POST PULL INSTRUCTIONS GIVEN:  Yes  POST PULL PULSES PRESENT:  Left pedal palpable +2, right pedal dopplerable  DRESSING APPLIED: yes, gauze with tegaderm  BEDREST BEGINS @  1016  COMMENTS:

## 2020-05-23 NOTE — Interval H&P Note (Signed)
History and Physical Interval Note:  05/23/2020 9:04 AM  [facility]  has presented today for surgery, with the diagnosis of PAD.  The various methods of treatment have been discussed with the patient and family. After consideration of risks, benefits and other options for treatment, the patient has consented to  Procedure(s): ABDOMINAL AORTOGRAM W/LOWER EXTREMITY (Bilateral) as a surgical intervention.  The patient's history has been reviewed, patient examined, no change in status, stable for surgery.  I have reviewed the patient's chart and labs.  Questions were answered to the patient's satisfaction.     Nanetta Batty

## 2020-05-23 NOTE — H&P (Signed)
05/23/20 Holly Guerra   1940/06/29  [id number]  Primary Physician Coralee Rud, PA-C Primary Cardiologist: Runell Gess MD Nicholes Calamity, [place]  HPI:  Holly Guerra is a 80 y.o.  thin appearing widowed African-American female mother of 2 children, grandmother of 5 grandchildren who is retired from working at [employer] . She was referred to Dr.Shanahan who unsuccessfully intervened on her right SFA 08/01/19. She does have a history of tobacco abuse, hypertension, hyperlipidemia and mitral valve disease. She has right calf lifestyle limiting claudication. Her left calf is felt good since I intervened 07/22/2012 of a left SFA CTO. I am going to obtain lower extremity arterial Doppler studies on her, review her op note from [facility] and decide whether or not to proceed with reattempt at right lower extremity revascularization.   I obtain lower extremity Dopplers on her 04/08/2020 revealing a right ABI of 0.51 and a left of 1.08.  She did appear to have a high-frequency signal in her distal left common femoral artery.  Her left SFA stent was patent.  Her right SFA was occluded and she had one-vessel runoff via peroneal.  Dr. Willette Pa did recommend femoropopliteal bypass grafting after failed attempt at right SFA percutaneous revascularization.  The patient has been on Pletal without benefit.  She wishes me to proceed with reattempt at right SFA intervention.   Active Medications      Current Meds  Medication Sig  . allopurinol (ZYLOPRIM) 100 MG tablet Take 100 mg by mouth daily.  Marland Kitchen amLODipine (NORVASC) 10 MG tablet Take 1 tablet (10 mg total) by mouth daily.  Marland Kitchen aspirin EC 81 MG tablet Take 1 tablet (81 mg total) by mouth daily.  Marland Kitchen atorvastatin (LIPITOR) 80 MG tablet Take 80 mg by mouth daily.  Marland Kitchen azelastine (ASTELIN) 0.1 % nasal spray Place 2 sprays into both nostrils 2 (two) times daily. Use in each nostril as directed  . azelastine (OPTIVAR) 0.05 % ophthalmic solution 1  drop 2 (two) times daily.  . benazepril (LOTENSIN) 40 MG tablet Take 40 mg by mouth daily.  . butalbital-acetaminophen-caffeine (FIORICET WITH CODEINE) 50-325-40-30 MG per capsule Take 1 capsule by mouth every 4 (four) hours as needed for headache. For pain  . cilostazol (PLETAL) 100 MG tablet Take 100 mg by mouth 2 (two) times daily.  . clopidogrel (PLAVIX) 75 MG tablet Take 1 tablet (75 mg total) by mouth daily.  . cyanocobalamin (,VITAMIN B-12,) 1000 MCG/ML injection Inject into the muscle.  . cyclobenzaprine (FLEXERIL) 10 MG tablet Take 10 mg by mouth at bedtime. 10 MG TABLET, 1 ORAL AT BEDTIME  . Fenofibric Acid 105 MG TABS Take 105 mg by mouth 1 day or 1 dose. 1 TABLET DAILY 105MG   . ferrous sulfate 325 (65 FE) MG tablet Take by mouth.  . fluticasone (FLONASE) 50 MCG/ACT nasal spray Place 2 sprays into both nostrils daily. 2 SPRAYS NASAL DAILY  . nebivolol (BYSTOLIC) 10 MG tablet Take 10 mg by mouth daily.  omeprazole (PRILOSEC) 40 MG capsule Take 40 mg by mouth daily.  . pantoprazole (PROTONIX) 40 MG tablet Take 40 mg by mouth daily.  . pregabalin (LYRICA) 50 MG capsule Take by mouth at bedtime.  . simvastatin (ZOCOR) 40 MG tablet Take by mouth.  . sucralfate (CARAFATE) 1 g tablet Take 1 g by mouth 4 (four) times daily. 1 TABLET FOUR TIMES DAILY  . traMADol (ULTRAM) 50 MG tablet Take 1 tablet by mouth 3 (three) times  daily.  . Vitamin D, Ergocalciferol, (DRISDOL) 1.25 MG (50000 UNIT) CAPS capsule Take 50,000 Units by mouth once a week.  . [DISCONTINUED] atorvastatin (LIPITOR) 40 MG tablet Take 40 mg by mouth daily.           Allergies  Allergen Reactions  . Lactose Intolerance (Gi) Diarrhea    Social History        Socioeconomic History  . Marital status: Widowed    Spouse name: Not on file  . Number of children: Not on file  . Years of education: Not on file  . Highest education level: Not on file  Occupational History  . Not on file  Tobacco Use  . Smoking  status: Former Smoker    Packs/day: 0.50    Years: 45.00    Pack years: 22.50    Types: Cigarettes    Quit date: 05/12/2007    Years since quitting: 12.9  . Smokeless tobacco: Never Used  . Tobacco comment: 07/22/2012 "stopped smoking ~ 5 yr ago; smoked since my early 20's"  Substance and Sexual Activity  . Alcohol use: Yes  . Drug use: No  . Sexual activity: Not on file  Other Topics Concern  . Not on file  Social History Narrative  . Not on file   Social Determinants of Health      Financial Resource Strain:   . Difficulty of Paying Living Expenses: Not on file  Food Insecurity:   . Worried About Programme researcher, broadcasting/film/video in the Last Year: Not on file  . Ran Out of Food in the Last Year: Not on file  Transportation Needs:   . Lack of Transportation (Medical): Not on file  . Lack of Transportation (Non-Medical): Not on file  Physical Activity:   . Days of Exercise per Week: Not on file  . Minutes of Exercise per Session: Not on file  Stress:   . Feeling of Stress : Not on file  Social Connections:   . Frequency of Communication with Friends and Family: Not on file  . Frequency of Social Gatherings with Friends and Family: Not on file  . Attends Religious Services: Not on file  . Active Member of Clubs or Organizations: Not on file  . Attends Banker Meetings: Not on file  . Marital Status: Not on file  Intimate Partner Violence:   . Fear of Current or Ex-Partner: Not on file  . Emotionally Abused: Not on file  . Physically Abused: Not on file  . Sexually Abused: Not on file     Review of Systems: General: negative for chills, fever, night sweats or weight changes.  Cardiovascular: negative for chest pain, dyspnea on exertion, edema, orthopnea, palpitations, paroxysmal nocturnal dyspnea or shortness of breath Dermatological: negative for rash Respiratory: negative for cough or wheezing Urologic: negative for hematuria Abdominal: negative for  nausea, vomiting, diarrhea, bright red blood per rectum, melena, or hematemesis Neurologic: negative for visual changes, syncope, or dizziness All other systems reviewed and are otherwise negative except as noted above.    Blood pressure 138/62, pulse 62, height 5\' 2"  (1.575 m), weight 123 lb 12.8 oz (56.2 kg).  General appearance: alert and no distress Neck: no adenopathy, no carotid bruit, no JVD, supple, symmetrical, trachea midline and thyroid not enlarged, symmetric, no tenderness/mass/nodules Lungs: clear to auscultation bilaterally Heart: regular rate and rhythm, S1, S2 normal, no murmur, click, rub or gallop Extremities: extremities normal, atraumatic, no cyanosis or edema Pulses: 2+ and symmetric Diminished pedal  pulses Skin: Skin color, texture, turgor normal. No rashes or lesions Neurologic: Alert and oriented X 3, normal strength and tone. Normal symmetric reflexes. Normal coordination and gait  EKG not performed today  ASSESSMENT AND PLAN:   PAD- S/P PTA and stenting of left SFA 07/22/12 Ms. Trevor returns today for follow-up.  She had failed attempt at right SFA intervention by Dr. Willette Pa at  [facility] 08/01/2019.  She has been on Pletal without benefit.  Bypass surgery was recommended however the patient has not pursued this.  She has one-vessel runoff via peroneal.  I did perform PTA and stenting of a left SFA CTO on her 07/22/2012.  Her claudication is lifestyle limiting and she wishes me to proceed with attempt at right SFA intervention.    Runell Gess, M.D., FACP, [facility], [facility] [place] [address] [place], [place]  [address]  [phone] 05/23/2020 8:20 AM

## 2020-05-23 NOTE — Discharge Instructions (Signed)
Femoral Site Care  This sheet gives you information about how to care for yourself after your procedure. Your health care provider may also give you more specific instructions. If you have problems or questions, contact your health care provider. What can I expect after the procedure? After the procedure, it is common to have:  Bruising that usually fades within 1-2 weeks.  Tenderness at the site. Follow these instructions at home: Wound care  Follow instructions from your health care provider about how to take care of your insertion site. Make sure you: ? Wash your hands with soap and water before you change your bandage (dressing). If soap and water are not available, use hand sanitizer. ? Change your dressing as told by your health care provider. ? Leave stitches (sutures), skin glue, or adhesive strips in place. These skin closures may need to stay in place for 2 weeks or longer. If adhesive strip edges start to loosen and curl up, you may trim the loose edges. Do not remove adhesive strips completely unless your health care provider tells you to do that.  Do not take baths, swim, or use a hot tub until your health care provider approves.  You may shower 24-48 hours after the procedure or as told by your health care provider. ? Gently wash the site with plain soap and water. ? Pat the area dry with a clean towel. ? Do not rub the site. This may cause bleeding.  Do not apply powder or lotion to the site. Keep the site clean and dry.  Check your femoral site every day for signs of infection. Check for: ? Redness, swelling, or pain. ? Fluid or blood. ? Warmth. ? Pus or a bad smell. Activity  For the first 2-3 days after your procedure, or as long as directed: ? Avoid climbing stairs as much as possible. ? Do not squat.  Do not lift anything that is heavier than 10 lb (4.5 kg), or the limit that you are told, until your health care provider says that it is safe.  Rest as  directed. ? Avoid sitting for a long time without moving. Get up to take short walks every 1-2 hours.  Do not drive for 24 hours if you were given a medicine to help you relax (sedative). General instructions  Take over-the-counter and prescription medicines only as told by your health care provider.  Keep all follow-up visits as told by your health care provider. This is important. Contact a health care provider if you have:  A fever or chills.  You have redness, swelling, or pain around your insertion site. Get help right away if:  The catheter insertion area swells very fast.  You pass out.  You suddenly start to sweat or your skin gets clammy.  The catheter insertion area is bleeding, and the bleeding does not stop when you hold steady pressure on the area.  The area near or just beyond the catheter insertion site becomes pale, cool, tingly, or numb. These symptoms may represent a serious problem that is an emergency. Do not wait to see if the symptoms will go away. Get medical help right away. Call your local emergency services (911 in the [place] Do not drive yourself to the hospital. Summary  After the procedure, it is common to have bruising that usually fades within 1-2 weeks.  Check your femoral site every day for signs of infection.  Do not lift anything that is heavier than 10 lb (4.5 kg), or   the limit that you are told, until your health care provider says that it is safe. This information is not intended to replace advice given to you by your health care provider. Make sure you discuss any questions you have with your health care provider. Document Revised: 12/29/2019 Document Reviewed: 12/29/2019 Elsevier Patient Education  2021 Elsevier Inc.  

## 2020-05-24 ENCOUNTER — Encounter (HOSPITAL_COMMUNITY): Payer: Self-pay | Admitting: Cardiovascular Disease

## 2020-05-30 ENCOUNTER — Telehealth: Payer: Self-pay | Admitting: Cardiovascular Disease

## 2020-05-30 ENCOUNTER — Ambulatory Visit (HOSPITAL_COMMUNITY)
Admission: RE | Admit: 2020-05-30 | Payer: Medicare Other | Source: Ambulatory Visit | Attending: Cardiovascular Disease | Admitting: Cardiovascular Disease

## 2020-05-30 NOTE — Telephone Encounter (Signed)
Returned call to patient, advised this is from procedure and to disregard.   Med d/c from med list

## 2020-05-30 NOTE — Telephone Encounter (Signed)
Pt c/o medication issue:  1. Name of Medication:   sodium chloride flush (NS) 0.9 % injection 3 mL   2. How are you currently taking this medication (dosage and times per day)? n/a  3. Are you having a reaction (difficulty breathing--STAT)? no  4. What is your medication issue? Patient would like to know what this medication is for. Please advise.

## 2020-06-14 ENCOUNTER — Other Ambulatory Visit: Payer: Self-pay

## 2020-06-14 ENCOUNTER — Encounter: Payer: Self-pay | Admitting: Cardiovascular Disease

## 2020-06-14 ENCOUNTER — Ambulatory Visit: Payer: Medicare Other | Admitting: Cardiovascular Disease

## 2020-06-14 VITALS — BP 134/62 | HR 71 | Ht 62.0 in | Wt 120.0 lb

## 2020-06-14 DIAGNOSIS — E785 Hyperlipidemia, unspecified: Secondary | ICD-10-CM

## 2020-06-14 DIAGNOSIS — I739 Peripheral vascular disease, unspecified: Secondary | ICD-10-CM

## 2020-06-14 DIAGNOSIS — I10 Essential (primary) hypertension: Secondary | ICD-10-CM

## 2020-06-14 NOTE — Patient Instructions (Addendum)
Medication Instructions:  Your physician recommends that you continue on your current medications as directed. Please refer to the Current Medication list given to you today.  *If you need a refill on your cardiac medications before your next appointment, please call your pharmacy*   Testing/Procedures: Your physician has requested that you have a carotid duplex. This test is an ultrasound of the carotid arteries in your neck. It looks at blood flow through these arteries that supply the brain with blood. Allow one hour for this exam. There are no restrictions or special instructions. This procedure is done at [address].   Follow-Up: At [facility], you and your health needs are our priority.  As part of our continuing mission to provide you with exceptional heart care, we have created designated Provider Care Teams.  These Care Teams include your primary Cardiologist (physician) and Advanced Practice Providers (APPs -  Physician Assistants and Nurse Practitioners) who all work together to provide you with the care you need, when you need it.  We recommend signing up for the patient portal called "MyChart".  Sign up information is provided on this After Visit Summary.  MyChart is used to connect with patients for Virtual Visits (Telemedicine).  Patients are able to view lab/test results, encounter notes, upcoming appointments, etc.  Non-urgent messages can be sent to your provider as well.   To learn more about what you can do with MyChart, go to [email or web address].    Your next appointment:   12 month(s)  The format for your next appointment:   In Person  Provider:   Jonathan Berry, MD 

## 2020-06-14 NOTE — Progress Notes (Signed)
06/14/2020 Manfred Shirts   25-Nov-1940  [id number]  Primary Physician Coralee Rud, PA-C Primary Cardiologist: Runell Gess MD Nicholes Calamity, [place]  HPI:  Holly Guerra is a 80 y.o.   thin appearing widowed African-American female mother of 2 children, grandmother of 5 grandchildren who is retired from working at [employer] .   I last saw her in the office 04/16/2020. She was referred to Dr.Shanahan who unsuccessfully intervened on her right SFA 08/01/19. She does have a history of tobacco abuse, hypertension, hyperlipidemia and mitral valve disease. She has right calf lifestyle limiting claudication. Her left calf is felt good since I intervened 07/22/2012 of a left SFA CTO. I am going to obtain lower extremity arterial Doppler studies on her, review her op note from [facility] and decide whether or not to proceed with reattempt at right lower extremity revascularization.   I obtain lower extremity Dopplers on her 04/08/2020 revealing a right ABI of 0.51 and a left of 1.08.  She did appear to have a high-frequency signal in her distal left common femoral artery.  Her left SFA stent was patent.  Her right SFA was occluded and she had one-vessel runoff via peroneal.  Dr. Willette Pa did recommend femoropopliteal bypass grafting after failed attempt at right SFA percutaneous revascularization.  The patient has been on Pletal without benefit.    I performed peripheral angiography on her 05/23/2020 revealing a patent left SFA self-expanding stent from the origin down to the adductor canal with diffuse 30% in-stent restenosis with one-vessel runoff via the peroneal.  She did have an 80% calcified left common femoral artery stenosis at the takeoff of the profunda femoris.  Her right SFA was occluded at its origin, reconstituting in the adductor canal by profunda femoris collaterals with a 90% right popliteal artery stenosis and one-vessel runoff via the peroneal.  I did not think she had percutaneous  options for revascularization or when she had good bypass candidate.  Current Meds  Medication Sig  . allopurinol (ZYLOPRIM) 100 MG tablet Take 100 mg by mouth every evening.  Marland Kitchen amLODipine (NORVASC) 10 MG tablet Take 1 tablet (10 mg total) by mouth daily. (Patient taking differently: Take 10 mg by mouth every evening.)  . aspirin EC 81 MG tablet Take 1 tablet (81 mg total) by mouth daily.  Marland Kitchen atorvastatin (LIPITOR) 80 MG tablet Take 80 mg by mouth every evening.  Marland Kitchen azelastine (ASTELIN) 0.1 % nasal spray Place 2 sprays into both nostrils 2 (two) times daily as needed (allergies.). Use in each nostril as directed  . azelastine (OPTIVAR) 0.05 % ophthalmic solution 1 drop 2 (two) times daily.  . benazepril (LOTENSIN) 40 MG tablet Take 40 mg by mouth every evening.  . cilostazol (PLETAL) 100 MG tablet Take 100 mg by mouth 2 (two) times daily.  . clopidogrel (PLAVIX) 75 MG tablet Take 1 tablet (75 mg total) by mouth daily. (Patient taking differently: Take 75 mg by mouth every evening.)  . cyanocobalamin (,VITAMIN B-12,) 1000 MCG/ML injection Inject 1,000 mg into the muscle every 30 (thirty) days.  . cyclobenzaprine (FLEXERIL) 10 MG tablet Take 10 mg by mouth at bedtime as needed (sleep).  . ferrous sulfate 325 (65 FE) MG tablet Take 325 mg by mouth daily.  . nebivolol (BYSTOLIC) 10 MG tablet Take 10 mg by mouth every evening.  . pantoprazole (PROTONIX) 40 MG tablet Take 40 mg by mouth daily as needed (acid reflux/indigestion.).  Marland Kitchen Propylene Glycol (SYSTANE COMPLETE)  0.6 % SOLN Place 1-2 drops into both eyes 3 (three) times daily as needed (dry/irritated eyes.).  Marland Kitchen sucralfate (CARAFATE) 1 g tablet Take 1 g by mouth 4 (four) times daily as needed (abdominal pain.).  Marland Kitchen traMADol (ULTRAM) 50 MG tablet Take 50 mg by mouth every 12 (twelve) hours as needed (pain.).  Marland Kitchen Vitamin D, Ergocalciferol, (DRISDOL) 1.25 MG (50000 UNIT) CAPS capsule Take 50,000 Units by mouth every 30 (thirty) days.     Allergies   Allergen Reactions  . Lactose Intolerance (Gi) Diarrhea    Social History   Socioeconomic History  . Marital status: Widowed    Spouse name: Not on file  . Number of children: Not on file  . Years of education: Not on file  . Highest education level: Not on file  Occupational History  . Not on file  Tobacco Use  . Smoking status: Former Smoker    Packs/day: 0.50    Years: 45.00    Pack years: 22.50    Types: Cigarettes    Quit date: 05/12/2007    Years since quitting: 13.1  . Smokeless tobacco: Never Used  . Tobacco comment: 07/22/2012 "stopped smoking ~ 5 yr ago; smoked since my early 20's"  Substance and Sexual Activity  . Alcohol use: Yes  . Drug use: No  . Sexual activity: Not on file  Other Topics Concern  . Not on file  Social History Narrative  . Not on file   Social Determinants of Health   Financial Resource Strain: Not on file  Food Insecurity: Not on file  Transportation Needs: Not on file  Physical Activity: Not on file  Stress: Not on file  Social Connections: Not on file  Intimate Partner Violence: Not on file     Review of Systems: General: negative for chills, fever, night sweats or weight changes.  Cardiovascular: negative for chest pain, dyspnea on exertion, edema, orthopnea, palpitations, paroxysmal nocturnal dyspnea or shortness of breath Dermatological: negative for rash Respiratory: negative for cough or wheezing Urologic: negative for hematuria Abdominal: negative for nausea, vomiting, diarrhea, bright red blood per rectum, melena, or hematemesis Neurologic: negative for visual changes, syncope, or dizziness All other systems reviewed and are otherwise negative except as noted above.    Blood pressure 134/62, pulse 71, height 5\' 2"  (1.575 m), weight 120 lb (54.4 kg).  General appearance: alert and no distress Neck: no adenopathy, no JVD, supple, symmetrical, trachea midline, thyroid not enlarged, symmetric, no tenderness/mass/nodules and  Soft left carotid bruit Lungs: clear to auscultation bilaterally Heart: regular rate and rhythm, S1, S2 normal, no murmur, click, rub or gallop Extremities: extremities normal, atraumatic, no cyanosis or edema Pulses: 2+ and symmetric Diminished pedal pulses Skin: Skin color, texture, turgor normal. No rashes or lesions Neurologic: Alert and oriented X 3, normal strength and tone. Normal symmetric reflexes. Normal coordination and gait  EKG not performed today  ASSESSMENT AND PLAN:   PAD- S/P PTA and stenting of left SFA 07/22/12 Ms. Ebrahim returns today for follow-up of her recent PV angiogram which I performed 05/23/2020.  This revealed a patent left SFA stent which I placed 07/22/2012.  She had 80% calcified left common femoral artery stenosis.  Her right SFA was occluded from the origin down to the adductor canal.  Was diffusely calcified.  She had 90% stenosis in her right popliteal artery with one-vessel runoff via the peroneal.  I did not think she had percutaneous options.  Her claudication is really fairly minimal on  Pletal 100 mg p.o. twice daily.  No further recommendations regarding intervention will be made at this time.      Runell Gess MD FACP,FACC,FAHA, [facility] 06/14/2020 10:31 AM

## 2020-06-14 NOTE — Assessment & Plan Note (Signed)
Holly Guerra returns today for follow-up of her recent PV angiogram which I performed 05/23/2020.  This revealed a patent left SFA stent which I placed 07/22/2012.  She had 80% calcified left common femoral artery stenosis.  Her right SFA was occluded from the origin down to the adductor canal.  Was diffusely calcified.  She had 90% stenosis in her right popliteal artery with one-vessel runoff via the peroneal.  I did not think she had percutaneous options.  Her claudication is really fairly minimal on Pletal 100 mg p.o. twice daily.  No further recommendations regarding intervention will be made at this time.

## 2020-06-28 ENCOUNTER — Ambulatory Visit (HOSPITAL_COMMUNITY)
Admission: RE | Admit: 2020-06-28 | Discharge: 2020-06-28 | Disposition: A | Discharge status: Home / Self Care | Payer: Medicare Other | Source: Ambulatory Visit | Attending: Cardiology | Admitting: Cardiology

## 2020-06-28 ENCOUNTER — Other Ambulatory Visit: Payer: Self-pay

## 2020-06-28 DIAGNOSIS — I10 Essential (primary) hypertension: Secondary | ICD-10-CM | POA: Insufficient documentation

## 2020-06-28 DIAGNOSIS — E785 Hyperlipidemia, unspecified: Secondary | ICD-10-CM | POA: Diagnosis not present

## 2021-04-16 ENCOUNTER — Other Ambulatory Visit: Payer: Self-pay

## 2021-04-16 ENCOUNTER — Ambulatory Visit (HOSPITAL_COMMUNITY): Admission: RE | Admit: 2021-04-16 | Payer: Medicare Other | Source: Ambulatory Visit

## 2021-04-16 DIAGNOSIS — I739 Peripheral vascular disease, unspecified: Secondary | ICD-10-CM

## 2021-04-30 ENCOUNTER — Other Ambulatory Visit: Payer: Self-pay

## 2021-04-30 ENCOUNTER — Ambulatory Visit (HOSPITAL_COMMUNITY)
Admission: RE | Admit: 2021-04-30 | Discharge: 2021-04-30 | Disposition: A | Discharge status: Home / Self Care | Payer: Medicare Other | Source: Ambulatory Visit | Attending: Vascular Surgery | Admitting: Vascular Surgery

## 2021-04-30 ENCOUNTER — Ambulatory Visit: Payer: Medicare Other | Admitting: Vascular Surgery

## 2021-04-30 ENCOUNTER — Encounter: Payer: Self-pay | Admitting: Vascular Surgery

## 2021-04-30 VITALS — BP 155/81 | HR 79 | Temp 97.9°F | Resp 20 | Ht 62.0 in | Wt 114.0 lb

## 2021-04-30 DIAGNOSIS — I739 Peripheral vascular disease, unspecified: Secondary | ICD-10-CM

## 2021-04-30 NOTE — Progress Notes (Signed)
Patient ID: Holly Guerra, female   DOB: 12/05/1940, 80 y.o.   MRN: [id number]  Reason for Consult: New Patient (Initial Visit)   Referred by Coralee Rud, PA-C  Subjective:     HPI:  Holly Guerra is a 80 y.o. female with history of claudication has had angiography by Dr. Allyson Sabal in the past.  At this time she states her claudication can be quite debilitating she can only walk to her mailbox.  She also has pain in her great toe this occurs not infrequently but is not reproducible.  She states that the cramping in her calf does relent with rest does not happen every time that she walks.  She does not have any tissue loss or ulceration.  She is a former smoker quit in 2009.  She states that her cholesterol is under control.  She also has hypertension which she tells me is controlled.  She does take aspirin, Plavix and statin.  Past Medical History:  Diagnosis Date   Abdominal bruit    Abnormal echocardiogram    Anemia    Aortic insufficiency    Aortic root dilation (HCC)    Aortic valve disorder    Arthritis    "all over" (07/22/2012)   Arthropathy    Bilateral carotid artery stenosis    Carotid artery disease (HCC)    Chest discomfort    Edema    Exertional shortness of breath    Fatigue    GERD (gastroesophageal reflux disease)    Gout    Hypercholesteremia    Hypertension    Mitral regurgitation and aortic stenosis    Mixed hyperlipidemia    Murmur, cardiac    Peripheral vascular disease (HCC)    Pernicious anemia    PVD (peripheral vascular disease) (HCC)    Shortness of breath    Sickle-cell trait (HCC)    Thoracic aortic aneurysm    Tobacco use    URI (upper respiratory infection)    Family History  Problem Relation Age of Onset   Hypertension Mother    Heart disease Mother    Past Surgical History:  Procedure Laterality Date   ABDOMINAL AORTOGRAM W/LOWER EXTREMITY Bilateral 05/23/2020   Procedure: ABDOMINAL AORTOGRAM W/LOWER EXTREMITY;  Surgeon: Runell Gess, MD;  Location: [facility];  Service: Cardiovascular;  Laterality: Bilateral;   ABDOMINAL HYSTERECTOMY  1970's   APPENDECTOMY  1970's   CARDIOVASCULAR STRESS TEST  06/16/2012   normal myocardial perfusion imaging, LV systolic function was normal without regional wall motion abnormalities, LV EF 60%   DOPPLER ECHOCARDIOGRAPHY  06/16/2012   EF 60-65%, mild concetric LVH, moderate aortic regurg   LOWER EXTREMITY ANGIOGRAM N/A 07/22/2012   Procedure: LOWER EXTREMITY ANGIOGRAM;  Surgeon: Runell Gess, MD;  Location: [facility] CATH LAB;  Service: Cardiovascular;  Laterality: N/A;   LOWER EXTREMITY ARTERIAL DOPPLER  08/08/2012   Left SFA stent-open and patent without evidence of hemodynamically significant stenosis   PERCUTANEOUS STENT INTERVENTION Left 07/22/2012   TOTAL THYROIDECTOMY  ~ 2010    Short Social History:  Social History   Tobacco Use   Smoking status: Former    Packs/day: 0.50    Years: 45.00    Pack years: 22.50    Types: Cigarettes    Quit date: 05/12/2007    Years since quitting: 13.9   Smokeless tobacco: Never   Tobacco comments:    07/22/2012 "stopped smoking ~ 5 yr ago; smoked since my early 20's"  Substance Use Topics  Alcohol use: Yes    Allergies  Allergen Reactions   Lactose Intolerance (Gi) Diarrhea    Current Outpatient Medications  Medication Sig Dispense Refill   allopurinol (ZYLOPRIM) 100 MG tablet Take 100 mg by mouth every evening.     amLODipine (NORVASC) 10 MG tablet Take 1 tablet (10 mg total) by mouth daily. (Patient taking differently: Take 10 mg by mouth every evening.) 30 tablet 5   aspirin EC 81 MG tablet Take 1 tablet (81 mg total) by mouth daily.     atorvastatin (LIPITOR) 80 MG tablet Take 80 mg by mouth every evening.     azelastine (ASTELIN) 0.1 % nasal spray Place 2 sprays into both nostrils 2 (two) times daily as needed (allergies.). Use in each nostril as directed     azelastine (OPTIVAR) 0.05 % ophthalmic solution 1 drop 2 (two)  times daily.     benazepril (LOTENSIN) 40 MG tablet Take 40 mg by mouth every evening.     cilostazol (PLETAL) 100 MG tablet Take 100 mg by mouth 2 (two) times daily.     clopidogrel (PLAVIX) 75 MG tablet Take 1 tablet (75 mg total) by mouth daily. (Patient taking differently: Take 75 mg by mouth every evening.) 30 tablet 11   cyanocobalamin (,VITAMIN B-12,) 1000 MCG/ML injection Inject 1,000 mg into the muscle every 30 (thirty) days.     cyclobenzaprine (FLEXERIL) 10 MG tablet Take 10 mg by mouth at bedtime as needed (sleep).     ferrous sulfate 325 (65 FE) MG tablet Take 325 mg by mouth daily.     nebivolol (BYSTOLIC) 10 MG tablet Take 10 mg by mouth every evening.     pantoprazole (PROTONIX) 40 MG tablet Take 40 mg by mouth daily as needed (acid reflux/indigestion.).     Propylene Glycol (SYSTANE COMPLETE) 0.6 % SOLN Place 1-2 drops into both eyes 3 (three) times daily as needed (dry/irritated eyes.).     sucralfate (CARAFATE) 1 g tablet Take 1 g by mouth 4 (four) times daily as needed (abdominal pain.).     traMADol (ULTRAM) 50 MG tablet Take 50 mg by mouth every 12 (twelve) hours as needed (pain.).     Vitamin D, Ergocalciferol, (DRISDOL) 1.25 MG (50000 UNIT) CAPS capsule Take 50,000 Units by mouth every 30 (thirty) days.     No current facility-administered medications for this visit.    Review of Systems  Constitutional:  Constitutional negative. HENT: HENT negative.  Eyes: Eyes negative.  Cardiovascular: Positive for claudication.  GI: Gastrointestinal negative.  Musculoskeletal: Positive for leg pain.  Skin: Skin negative.  Neurological: Neurological negative. Hematologic: Hematologic/lymphatic negative.  Psychiatric: Psychiatric negative.       Objective:  Objective   Vitals:   04/30/21 1028  BP: (!) 155/81  Pulse: 79  Resp: 20  Temp: 97.9 F (36.6 C)  SpO2: 99%  Weight: 114 lb (51.7 kg)  Height: 5\' 2"  (1.575 m)   Body mass index is 20.85 kg/m.  Physical  Exam HENT:     Head: Normocephalic.     Nose:     Comments: Wearing a mask Eyes:     Pupils: Pupils are equal, round, and reactive to light.  Cardiovascular:     Pulses:          Femoral pulses are 1+ on the right side and 1+ on the left side.      Popliteal pulses are 0 on the right side and 0 on the left side.  Comments: Monophasic posterior tibial signal on the right and monophasic dorsalis pedis signal on the left Pulmonary:     Effort: Pulmonary effort is normal.  Abdominal:     General: Abdomen is flat.     Palpations: Abdomen is soft. There is no mass.  Musculoskeletal:        General: Normal range of motion.  Skin:    General: Skin is warm and dry.  Neurological:     General: No focal deficit present.     Mental Status: She is alert.  Psychiatric:        Mood and Affect: Mood normal.        Behavior: Behavior normal.        Thought Content: Thought content normal.    Data: ABI Findings:  +---------+------------------+-----+----------+--------+   Right     Rt Pressure (mmHg) Index Waveform   Comment    +---------+------------------+-----+----------+--------+   Brachial  147                                            +---------+------------------+-----+----------+--------+   PTA       84                 0.55  monophasic            +---------+------------------+-----+----------+--------+   DP        0                  0.00  absent                +---------+------------------+-----+----------+--------+   Great Toe 0                  0.00  Absent                +---------+------------------+-----+----------+--------+   +---------+------------------+-----+--------+-------+   Left      Lt Pressure (mmHg) Index Waveform Comment   +---------+------------------+-----+--------+-------+   Brachial  154                                         +---------+------------------+-----+--------+-------+   PTA       0                  0.00  absent              +---------+------------------+-----+--------+-------+   DP        161                1.05  biphasic           +---------+------------------+-----+--------+-------+   Great Toe 123                0.80                     +---------+------------------+-----+--------+-------+   +-------+-----------+-----------+------------+------------+   ABI/TBI Today's ABI Today's TBI Previous ABI Previous TBI   +-------+-----------+-----------+------------+------------+   Right   0.55        0           0.5          0.23           +-------+-----------+-----------+------------+------------+   Left    1.05  0.8         1.08         0.84           +-------+-----------+-----------+------------+------------+          Assessment/Plan:     80 year old female with short distance claudication.  She has been evaluated with angiography as recently as January of this year.  I discussed with the patient that she would need bypass surgery if we were to improve her blood flow in her right lower extremity where most of her symptoms are.  At this time she does have a toe pressure of 0 but she does not have any ulceration and no rest pain per se.  She would like to wait until after Christmas to consider any surgical intervention.  The plan will be to follow-up with Dr. Allyson Sabal after Christmas which she already has scheduled and she can discuss angiography at that time with a goal to plan surgical intervention.  If she does go that route I will be available for surgery if she is cleared for bypass by Dr. Allyson Sabal.  She can otherwise follow-up with me on an as-needed basis.     Maeola Harman MD [facility]

## 2021-05-02 ENCOUNTER — Other Ambulatory Visit (HOSPITAL_COMMUNITY): Payer: Self-pay | Admitting: Cardiovascular Disease

## 2021-05-02 DIAGNOSIS — I739 Peripheral vascular disease, unspecified: Secondary | ICD-10-CM

## 2021-05-08 ENCOUNTER — Ambulatory Visit (HOSPITAL_COMMUNITY)
Admission: RE | Admit: 2021-05-08 | Discharge: 2021-05-08 | Disposition: A | Discharge status: Home / Self Care | Payer: Medicare Other | Source: Ambulatory Visit | Attending: Cardiology | Admitting: Cardiology

## 2021-05-08 ENCOUNTER — Other Ambulatory Visit: Payer: Self-pay

## 2021-05-08 DIAGNOSIS — I739 Peripheral vascular disease, unspecified: Secondary | ICD-10-CM | POA: Diagnosis present

## 2021-05-27 ENCOUNTER — Other Ambulatory Visit (HOSPITAL_COMMUNITY): Payer: Self-pay | Admitting: Cardiovascular Disease

## 2021-05-27 DIAGNOSIS — I739 Peripheral vascular disease, unspecified: Secondary | ICD-10-CM

## 2021-10-22 ENCOUNTER — Emergency Department (HOSPITAL_BASED_OUTPATIENT_CLINIC_OR_DEPARTMENT_OTHER)
Admission: EM | Admit: 2021-10-22 | Discharge: 2021-10-22 | Disposition: A | Discharge status: Home / Self Care | Payer: Medicare Other | Attending: Emergency Medicine | Admitting: Emergency Medicine

## 2021-10-22 ENCOUNTER — Emergency Department (HOSPITAL_BASED_OUTPATIENT_CLINIC_OR_DEPARTMENT_OTHER): Payer: Medicare Other

## 2021-10-22 ENCOUNTER — Encounter (HOSPITAL_BASED_OUTPATIENT_CLINIC_OR_DEPARTMENT_OTHER): Payer: Self-pay

## 2021-10-22 ENCOUNTER — Other Ambulatory Visit: Payer: Self-pay

## 2021-10-22 DIAGNOSIS — Z7901 Long term (current) use of anticoagulants: Secondary | ICD-10-CM | POA: Diagnosis not present

## 2021-10-22 DIAGNOSIS — R935 Abnormal findings on diagnostic imaging of other abdominal regions, including retroperitoneum: Secondary | ICD-10-CM | POA: Diagnosis not present

## 2021-10-22 DIAGNOSIS — N644 Mastodynia: Secondary | ICD-10-CM | POA: Diagnosis present

## 2021-10-22 DIAGNOSIS — Z7982 Long term (current) use of aspirin: Secondary | ICD-10-CM | POA: Insufficient documentation

## 2021-10-22 DIAGNOSIS — R1084 Generalized abdominal pain: Secondary | ICD-10-CM | POA: Diagnosis not present

## 2021-10-22 DIAGNOSIS — M79602 Pain in left arm: Secondary | ICD-10-CM | POA: Insufficient documentation

## 2021-10-22 DIAGNOSIS — Z87891 Personal history of nicotine dependence: Secondary | ICD-10-CM | POA: Diagnosis not present

## 2021-10-22 DIAGNOSIS — I2510 Atherosclerotic heart disease of native coronary artery without angina pectoris: Secondary | ICD-10-CM | POA: Insufficient documentation

## 2021-10-22 DIAGNOSIS — Z79899 Other long term (current) drug therapy: Secondary | ICD-10-CM | POA: Diagnosis not present

## 2021-10-22 DIAGNOSIS — R197 Diarrhea, unspecified: Secondary | ICD-10-CM | POA: Insufficient documentation

## 2021-10-22 DIAGNOSIS — I10 Essential (primary) hypertension: Secondary | ICD-10-CM | POA: Diagnosis not present

## 2021-10-22 DIAGNOSIS — R748 Abnormal levels of other serum enzymes: Secondary | ICD-10-CM

## 2021-10-22 LAB — CBC WITH DIFFERENTIAL/PLATELET
Abs Immature Granulocytes: 0.01 10*3/uL (ref 0.00–0.07)
Basophils Absolute: 0.1 10*3/uL (ref 0.0–0.1)
Basophils Relative: 2 %
Eosinophils Absolute: 0.1 10*3/uL (ref 0.0–0.5)
Eosinophils Relative: 2 %
HCT: 38.7 % (ref 36.0–46.0)
Hemoglobin: 12.8 g/dL (ref 12.0–15.0)
Immature Granulocytes: 0 %
Lymphocytes Relative: 29 %
Lymphs Abs: 0.8 10*3/uL (ref 0.7–4.0)
MCH: 24.2 pg — ABNORMAL LOW (ref 26.0–34.0)
MCHC: 33.1 g/dL (ref 30.0–36.0)
MCV: 73.3 fL — ABNORMAL LOW (ref 80.0–100.0)
Monocytes Absolute: 0.4 10*3/uL (ref 0.1–1.0)
Monocytes Relative: 15 %
Neutro Abs: 1.5 10*3/uL — ABNORMAL LOW (ref 1.7–7.7)
Neutrophils Relative %: 52 %
Platelets: 257 10*3/uL (ref 150–400)
RBC: 5.28 MIL/uL — ABNORMAL HIGH (ref 3.87–5.11)
RDW: 21.6 % — ABNORMAL HIGH (ref 11.5–15.5)
WBC: 2.8 10*3/uL — ABNORMAL LOW (ref 4.0–10.5)
nRBC: 1.1 % — ABNORMAL HIGH (ref 0.0–0.2)

## 2021-10-22 LAB — COMPREHENSIVE METABOLIC PANEL
ALT: 111 U/L — ABNORMAL HIGH (ref 0–44)
AST: 344 U/L — ABNORMAL HIGH (ref 15–41)
Albumin: 3.4 g/dL — ABNORMAL LOW (ref 3.5–5.0)
Alkaline Phosphatase: 66 U/L (ref 38–126)
Anion gap: 9 (ref 5–15)
BUN: 13 mg/dL (ref 8–23)
CO2: 23 mmol/L (ref 22–32)
Calcium: 8.5 mg/dL — ABNORMAL LOW (ref 8.9–10.3)
Chloride: 106 mmol/L (ref 98–111)
Creatinine, Ser: 0.94 mg/dL (ref 0.44–1.00)
GFR, Estimated: >60 mL/min (ref >60)
Glucose, Bld: 157 mg/dL — ABNORMAL HIGH (ref 70–99)
Potassium: 3.4 mmol/L — ABNORMAL LOW (ref 3.5–5.1)
Sodium: 138 mmol/L (ref 135–145)
Total Bilirubin: 2.2 mg/dL — ABNORMAL HIGH (ref 0.3–1.2)
Total Protein: 6.9 g/dL (ref 6.5–8.1)

## 2021-10-22 LAB — URINALYSIS, ROUTINE W REFLEX MICROSCOPIC
Bilirubin Urine: NEGATIVE
Glucose, UA: NEGATIVE mg/dL
Ketones, ur: NEGATIVE mg/dL
Leukocytes,Ua: NEGATIVE
Nitrite: NEGATIVE
Protein, ur: 100 mg/dL — AB
Specific Gravity, Urine: 1.01 (ref 1.005–1.030)
pH: 6 (ref 5.0–8.0)

## 2021-10-22 LAB — URINALYSIS, MICROSCOPIC (REFLEX)

## 2021-10-22 LAB — TROPONIN I (HIGH SENSITIVITY)
Troponin I (High Sensitivity): 10 ng/L (ref <18)
Troponin I (High Sensitivity): 10 ng/L (ref <18)

## 2021-10-22 LAB — LIPASE, BLOOD: Lipase: 32 U/L (ref 11–51)

## 2021-10-22 LAB — TSH: TSH: 2.555 u[IU]/mL (ref 0.350–4.500)

## 2021-10-22 LAB — MAGNESIUM: Magnesium: 1.8 mg/dL (ref 1.7–2.4)

## 2021-10-22 MED ORDER — IOHEXOL 300 MG/ML  SOLN
100.0000 mL | Freq: Once | INTRAMUSCULAR | Status: AC | PRN
  Administered 2021-10-22: 100 mL via INTRAVENOUS

## 2021-10-22 NOTE — ED Triage Notes (Signed)
Pt c/o intermittent pain to left breast and left arm since yesterday and blurred vision that started this morning.  Pt also reports some intermittent diarrhea as well.

## 2021-10-22 NOTE — ED Provider Notes (Signed)
3:20 PM Patient signed out to me by previous ED physician. Pt is a 81 yo female presenting for left arm/breast pain. Pt also had vague abdominal pain. Currently being evaluated by GI.   Workup: Stable ecg, trops, electrolytes, and cxr. CT abd/pelvis demonstrates possible malignancy.  Delta trop pending.   Plan: DC when labs back   Physical Exam  BP 138/78   Pulse 90   Temp 98.2 F (36.8 C)   Resp 20   Ht 5\' 3"  (1.6 m)   Wt 49.4 kg   SpO2 99%   BMI 19.31 kg/m   Physical Exam Vitals and nursing note reviewed.  Constitutional:      General: She is not in acute distress.    Appearance: She is well-developed.  HENT:     Head: Normocephalic and atraumatic.  Eyes:     Conjunctiva/sclera: Conjunctivae normal.  Cardiovascular:     Rate and Rhythm: Normal rate and regular rhythm.     Heart sounds: No murmur heard. Pulmonary:     Effort: Pulmonary effort is normal. No respiratory distress.     Breath sounds: Normal breath sounds.  Abdominal:     Palpations: Abdomen is soft.     Tenderness: There is no abdominal tenderness.  Musculoskeletal:        General: No swelling.     Cervical back: Neck supple.  Skin:    General: Skin is warm and dry.     Capillary Refill: Capillary refill takes less than 2 seconds.  Neurological:     Mental Status: She is alert.  Psychiatric:        Mood and Affect: Mood normal.     Procedures  Procedures  ED Course / MDM   Clinical Course as of 10/22/21 1730  Wed Oct 22, 2021  1640 Non Squamous Epithelial(!): PRESENT [AG]    Clinical Course User Index [AG] Oct 24, 2021, DO   Medical Decision Making Amount and/or Complexity of Data Reviewed Labs: ordered. Decision-making details documented in ED Course. Radiology: ordered. ECG/medicine tests: ordered.  Risk Prescription drug management.   Patient seen for generalized abdominal pain CAT scan demonstrates wall thickening of the rectum.  Cannot exclude possible malignancy.   Patient currently being followed by GI specialist for possible malignancy.  Recommended for close follow-up in the next 2 weeks.  CT report printed.  Patient also presented with left-sided breast and left upper extremity pain.  Stable ECG with no ST segment ovation or depression.  Stable troponins, delta troponin, electrolytes, and chest x-ray. No myocardial infarction.  No chest wall tenderness on exam.   No signs of hypoxia, difficulty breathing, or respiratory distress.  Pain is not acute, abrupt, or tearing.  Doubt aortic dissection.  No pneumothorax on chest x-ray.  No pneumonia.  Pulmonary embolism considered however patient states pain is in the breast tissue.  No hypoxia.  No signs of respiratory distress.  Patient recommended for close return to emergency department if shortness of breath or chest pain develops.  Patient in no distress and overall condition improved here in the ED. Detailed discussions were had with the patient regarding current findings, and need for close f/u with PCP or on call doctor. The patient has been instructed to return immediately if the symptoms worsen in any way for re-evaluation. Patient verbalized understanding and is in agreement with current care plan. All questions answered prior to discharge.        Franne Forts, DO 10/22/21 1731

## 2022-05-08 ENCOUNTER — Ambulatory Visit (HOSPITAL_COMMUNITY)
Admission: RE | Admit: 2022-05-08 | Discharge: 2022-05-08 | Disposition: A | Discharge status: Home / Self Care | Payer: Medicare Other | Source: Ambulatory Visit | Attending: Cardiovascular Disease | Admitting: Cardiovascular Disease

## 2022-05-08 DIAGNOSIS — I739 Peripheral vascular disease, unspecified: Secondary | ICD-10-CM | POA: Diagnosis present

## 2022-05-14 ENCOUNTER — Other Ambulatory Visit: Payer: Self-pay

## 2022-05-14 DIAGNOSIS — I739 Peripheral vascular disease, unspecified: Secondary | ICD-10-CM

## 2022-11-15 ENCOUNTER — Emergency Department (HOSPITAL_BASED_OUTPATIENT_CLINIC_OR_DEPARTMENT_OTHER)
Admission: EM | Admit: 2022-11-15 | Discharge: 2022-11-16 | Disposition: A | Discharge status: Home / Self Care | Payer: Medicare Other | Attending: Emergency Medicine | Admitting: Emergency Medicine

## 2022-11-15 ENCOUNTER — Other Ambulatory Visit: Payer: Self-pay

## 2022-11-15 ENCOUNTER — Encounter (HOSPITAL_BASED_OUTPATIENT_CLINIC_OR_DEPARTMENT_OTHER): Payer: Self-pay | Admitting: Emergency Medicine

## 2022-11-15 DIAGNOSIS — R040 Epistaxis: Secondary | ICD-10-CM | POA: Diagnosis present

## 2022-11-15 DIAGNOSIS — Z79899 Other long term (current) drug therapy: Secondary | ICD-10-CM | POA: Insufficient documentation

## 2022-11-15 DIAGNOSIS — Z7902 Long term (current) use of antithrombotics/antiplatelets: Secondary | ICD-10-CM | POA: Diagnosis not present

## 2022-11-15 DIAGNOSIS — Z7982 Long term (current) use of aspirin: Secondary | ICD-10-CM | POA: Diagnosis not present

## 2022-11-15 DIAGNOSIS — I10 Essential (primary) hypertension: Secondary | ICD-10-CM | POA: Diagnosis not present

## 2022-11-15 DIAGNOSIS — Z720 Tobacco use: Secondary | ICD-10-CM | POA: Insufficient documentation

## 2022-11-15 LAB — CBC
HCT: 35.3 % — ABNORMAL LOW (ref 36.0–46.0)
Hemoglobin: 11.6 g/dL — ABNORMAL LOW (ref 12.0–15.0)
MCH: 27.8 pg (ref 26.0–34.0)
MCHC: 32.9 g/dL (ref 30.0–36.0)
MCV: 84.4 fL (ref 80.0–100.0)
Platelets: 236 10*3/uL (ref 150–400)
RBC: 4.18 MIL/uL (ref 3.87–5.11)
RDW: 19.7 % — ABNORMAL HIGH (ref 11.5–15.5)
WBC: 6.9 10*3/uL (ref 4.0–10.5)
nRBC: 0 % (ref 0.0–0.2)

## 2022-11-15 MED ORDER — LIDOCAINE-EPINEPHRINE (PF) 2 %-1:200000 IJ SOLN
20.0000 mL | Freq: Once | INTRAMUSCULAR | Status: DC
  Filled 2022-11-15: qty 20

## 2022-11-15 MED ORDER — OXYMETAZOLINE HCL 0.05 % NA SOLN
1.0000 | Freq: Once | NASAL | Status: AC
  Administered 2022-11-15: 1 via NASAL
  Filled 2022-11-15: qty 30

## 2022-11-15 NOTE — ED Notes (Signed)
MD made aware of pt. HTN -

## 2022-11-15 NOTE — ED Provider Notes (Signed)
[name] Provider Note   CSN: [id number] Arrival date & time: 11/15/22  2250     History {Add pertinent medical, surgical, social history, OB history to HPI:1} Chief Complaint  Patient presents with   Epistaxis    Holly Guerra is a 82 y.o. female.  The history is provided by the patient.  Epistaxis Location:  L nare Severity:  Severe Timing:  Constant Progression:  Worsening Chronicity:  Recurrent Worsened by:  Nothing Patient with extensive medical history including peripheral vascular disease on Plavix presents with nosebleed.  Patient reports intermittent nosebleed recently, but this episode worsened over the past several hours.  It starts in her left nare.  She has tried applying pressure without improvement.  She has had this issue previously and required cautery    Past Medical History:  Diagnosis Date   Abdominal bruit    Abnormal echocardiogram    Anemia    Aortic insufficiency    Aortic root dilation (HCC)    Aortic valve disorder    Arthritis    "all over" (07/22/2012)   Arthropathy    Bilateral carotid artery stenosis    Carotid artery disease (HCC)    Chest discomfort    Edema    Exertional shortness of breath    Fatigue    GERD (gastroesophageal reflux disease)    Gout    Hypercholesteremia    Hypertension    Mitral regurgitation and aortic stenosis    Mixed hyperlipidemia    Murmur, cardiac    Peripheral vascular disease (HCC)    Pernicious anemia    PVD (peripheral vascular disease) (HCC)    Shortness of breath    Sickle-cell trait (HCC)    Thoracic aortic aneurysm (HCC)    Tobacco use    URI (upper respiratory infection)     Home Medications Prior to Admission medications   Medication Sig Start Date End Date Taking? Authorizing Provider  allopurinol (ZYLOPRIM) 100 MG tablet Take 100 mg by mouth every evening.    [provider]  amLODipine (NORVASC) 10 MG tablet Take 1 tablet (10 mg  total) by mouth daily. Patient taking differently: Take 10 mg by mouth every evening. 07/24/12   Allayne Butcher, PA-C  aspirin EC 81 MG tablet Take 1 tablet (81 mg total) by mouth daily. 07/24/12   Robbie Lis M, PA-C  atorvastatin (LIPITOR) 80 MG tablet Take 80 mg by mouth every evening. 03/06/20   [provider]  azelastine (ASTELIN) 0.1 % nasal spray Place 2 sprays into both nostrils 2 (two) times daily as needed (allergies.). Use in each nostril as directed    [provider]  azelastine (OPTIVAR) 0.05 % ophthalmic solution 1 drop 2 (two) times daily.    [provider]  benazepril (LOTENSIN) 40 MG tablet Take 40 mg by mouth every evening.    [provider]  cilostazol (PLETAL) 100 MG tablet Take 100 mg by mouth 2 (two) times daily.    [provider]  clopidogrel (PLAVIX) 75 MG tablet Take 1 tablet (75 mg total) by mouth daily. Patient taking differently: Take 75 mg by mouth every evening. 07/24/12   Robbie Lis M, PA-C  cyanocobalamin (,VITAMIN B-12,) 1000 MCG/ML injection Inject 1,000 mg into the muscle every 30 (thirty) days. 03/08/20   [provider]  cyclobenzaprine (FLEXERIL) 10 MG tablet Take 10 mg by mouth at bedtime as needed (sleep).    [provider]  ferrous sulfate 325 (  65 FE) MG tablet Take 325 mg by mouth daily.    [provider]  nebivolol (BYSTOLIC) 10 MG tablet Take 10 mg by mouth every evening.    [provider]  pantoprazole (PROTONIX) 40 MG tablet Take 40 mg by mouth daily as needed (acid reflux/indigestion.).    [provider]  Propylene Glycol (SYSTANE COMPLETE) 0.6 % SOLN Place 1-2 drops into both eyes 3 (three) times daily as needed (dry/irritated eyes.).    [provider]  sucralfate (CARAFATE) 1 g tablet Take 1 g by mouth 4 (four) times daily as needed (abdominal pain.).    [provider]  traMADol (ULTRAM) 50 MG tablet Take 50 mg by  mouth every 12 (twelve) hours as needed (pain.). 07/25/15   [provider]  Vitamin D, Ergocalciferol, (DRISDOL) 1.25 MG (50000 UNIT) CAPS capsule Take 50,000 Units by mouth every 30 (thirty) days. 03/20/20   [provider]      Allergies    Lactose intolerance (gi)    Review of Systems   Review of Systems  HENT:  Positive for nosebleeds.     Physical Exam Updated Vital Signs BP (!) 208/100   Pulse 79   Resp 16   Ht 1.549 m (5\' 1" )   Wt 45.4 kg   SpO2 100%   BMI 18.89 kg/m  Physical Exam CONSTITUTIONAL: elderly, no distress HEAD: Normocephalic/atraumatic EYES: EOMI/PERRL ENMT: Mucous membranes moist,gross blood in both nares NEURO: Pt is awake/alert/appropriate, moves all extremitiesx4.  SKIN: warm, color normal PSYCH: no abnormalities of mood noted, alert and oriented to situation  ED Results / Procedures / Treatments   Labs (all labs ordered are listed, but only abnormal results are displayed) Labs Reviewed  CBC - Abnormal; Notable for the following components:      Result Value   Hemoglobin 11.6 (*)    HCT 35.3 (*)    RDW 19.7 (*)    All other components within normal limits    EKG None  Radiology No results found.  Procedures .Epistaxis Management  Date/Time: 11/15/2022 11:53 PM  Performed by: Zadie Rhine, MD Authorized by: Zadie Rhine, MD     {Document cardiac monitor, telemetry assessment procedure when appropriate:1}  Medications Ordered in ED Medications  lidocaine-EPINEPHrine (XYLOCAINE W/EPI) 2 %-1:200000 (PF) injection 20 mL (has no administration in time range)  oxymetazoline (AFRIN) 0.05 % nasal spray 1 spray (1 spray Each Nare Given 11/15/22 2307)    ED Course/ Medical Decision Making/ A&P   {   Click here for ABCD2, HEART and other calculatorsREFRESH Note before signing :1}                          Medical Decision Making Amount and/or Complexity of Data Reviewed Labs: ordered.  Risk OTC  drugs. Prescription drug management.   ***  {Document critical care time when appropriate:1} {Document review of labs and clinical decision tools ie heart score, Chads2Vasc2 etc:1}  {Document your independent review of radiology images, and any outside records:1} {Document your discussion with family members, caretakers, and with consultants:1} {Document social determinants of health affecting pt's care:1} {Document your decision making why or why not admission, treatments were needed:1} Final Clinical Impression(s) / ED Diagnoses Final diagnoses:  None    Rx / DC Orders ED Discharge Orders     None

## 2022-11-15 NOTE — ED Notes (Signed)
Md at bedside

## 2022-11-15 NOTE — ED Triage Notes (Signed)
Nose bleed started around 2030 tonight from BL nares.  On plavix.

## 2022-11-16 ENCOUNTER — Other Ambulatory Visit (INDEPENDENT_AMBULATORY_CARE_PROVIDER_SITE_OTHER): Payer: Self-pay | Admitting: Physician Assistant

## 2022-11-16 ENCOUNTER — Telehealth: Payer: Self-pay | Admitting: Otolaryngology

## 2022-11-16 MED ORDER — AMOXICILLIN-POT CLAVULANATE 875-125 MG PO TABS: 1.0000 | ORAL_TABLET | Freq: Two times a day (BID) | ORAL | 0 refills | Status: DC

## 2022-11-16 MED ORDER — CEPHALEXIN 500 MG PO CAPS: 500.0000 mg | ORAL_CAPSULE | Freq: Two times a day (BID) | ORAL | 0 refills | Status: DC

## 2022-11-16 NOTE — Telephone Encounter (Signed)
Patient's daughter, Cala Bradford, wanted her mother's chart to reflect that she is taking Kristin Bruins and not Plavix.  They were mistaken when talking to [facility].

## 2022-11-16 NOTE — Discharge Instructions (Addendum)
Please get an appointment in the next 48 hours for a recheck of the packing

## 2022-11-16 NOTE — Telephone Encounter (Signed)
Thanks

## 2022-11-16 NOTE — Telephone Encounter (Addendum)
Patient's daughter, Kyrstle Kaufhold, called to get an appt as soon as possible.  Her mother was in the ED at [facility] earlier this morning and was referred to [place] for follow up.  Her mother was in the ED for severe nose bleed. She has had a history of nose bleeds but this morning's was heavier flow. ED doctor had to pack her nose.  Please call her at [phone]

## 2022-11-16 NOTE — Progress Notes (Signed)
I spoke with Holly Guerra and also her daughter today. We will see her in the office on Thursday at 1245.  I have also instructed her to reach out to her healthcare provider that is prescribing her aspirin and Plavix and discuss holding this if she is medically able to do this.,  I have switched her antibiotic to Augmentin with appropriate precautions for taking this medication.  And have also recommended that she use nasal saline sprays as well as Vaseline twice daily.  If she develops any further bleeding she will go to the emergency room.  She verbalized understanding and agreement to today's plan had no further questions or concerns.

## 2022-11-16 NOTE — Telephone Encounter (Signed)
I spoke with Dr. Irene Pap, if she had nasal packing in then we can see her Thursday and 12:45. She also said that if you have questions like this we can start making a list of questions from the morning, she will review it at lunch and let you know what to do. That way we can start making a list of things and timeframe for follow up

## 2022-11-19 ENCOUNTER — Institutional Professional Consult (permissible substitution) (INDEPENDENT_AMBULATORY_CARE_PROVIDER_SITE_OTHER): Payer: Medicare Other | Admitting: Otolaryngology

## 2022-11-23 ENCOUNTER — Encounter (INDEPENDENT_AMBULATORY_CARE_PROVIDER_SITE_OTHER): Payer: Self-pay | Admitting: Otolaryngology

## 2022-11-23 ENCOUNTER — Encounter: Payer: Self-pay | Admitting: Otolaryngology

## 2022-11-23 ENCOUNTER — Ambulatory Visit (INDEPENDENT_AMBULATORY_CARE_PROVIDER_SITE_OTHER): Payer: Medicare Other | Admitting: Otolaryngology

## 2022-11-23 VITALS — BP 181/82 | HR 97 | Temp 98.3°F | Resp 22 | Ht 62.0 in | Wt 100.6 lb

## 2022-11-23 DIAGNOSIS — R0981 Nasal congestion: Secondary | ICD-10-CM

## 2022-11-23 DIAGNOSIS — I739 Peripheral vascular disease, unspecified: Secondary | ICD-10-CM

## 2022-11-23 DIAGNOSIS — I10 Essential (primary) hypertension: Secondary | ICD-10-CM

## 2022-11-23 DIAGNOSIS — R040 Epistaxis: Secondary | ICD-10-CM

## 2022-11-23 DIAGNOSIS — J342 Deviated nasal septum: Secondary | ICD-10-CM

## 2022-11-23 MED ORDER — OXYMETAZOLINE HCL 0.05 % NA SOLN: 1.0000 | Freq: Two times a day (BID) | NASAL | 0 refills | Status: DC

## 2022-11-23 MED ORDER — SALINE SPRAY 0.65 % NA SOLN: 1.0000 | NASAL | 5 refills | Status: DC | PRN

## 2022-11-23 NOTE — Progress Notes (Signed)
ENT CONSULT:  Reason for Consult: epistaxis   HPI: Holly Guerra is an 82 y.o. female with hx of nose bleeds, here after a visit of epistaxis requiring ED visit. She was packed on the left side, occurred while she was asleep, woke up with left sided nose bleed and post-nasal drainage. She is on Clopidogrel since 1992 for PVD. She is also on Xarelto and ASA 81 mg for hx of peripheral vascular disease. She states it is always her left side that bleeds. She had nasal cautery done for left sided epistaxis ~ 5 yrs ago. She uses humidifier at home. She reports sinus/nasal congestion from time to time, but not frequently. She is not on allergy medications or nasal sprays. She smokes 2-3 times a week, never been a heavy smoker per report. Her nasal packing fell out 3 days after her nose was packed in ED.   Hx of pernicious anemia hx, managed by her PCP, on medication for it, cannot recall what it is, she needs a new PCP   Records Reviewed:  PAD- S/P PTA and stenting of left SFA 07/22/12  Hx of     Past Medical History:  Diagnosis Date   Abdominal bruit    Abnormal echocardiogram    Anemia    Aortic insufficiency    Aortic root dilation (HCC)    Aortic valve disorder    Arthritis    "all over" (07/22/2012)   Arthropathy    Bilateral carotid artery stenosis    Carotid artery disease (HCC)    Chest discomfort    Edema    Exertional shortness of breath    Fatigue    GERD (gastroesophageal reflux disease)    Gout    Hypercholesteremia    Hypertension    Mitral regurgitation and aortic stenosis    Mixed hyperlipidemia    Murmur, cardiac    Peripheral vascular disease (HCC)    Pernicious anemia    PVD (peripheral vascular disease) (HCC)    Shortness of breath    Sickle-cell trait (HCC)    Thoracic aortic aneurysm (HCC)    Tobacco use    URI (upper respiratory infection)     Past Surgical History:  Procedure Laterality Date   ABDOMINAL AORTOGRAM W/LOWER EXTREMITY Bilateral 05/23/2020    Procedure: ABDOMINAL AORTOGRAM W/LOWER EXTREMITY;  Surgeon: Runell Gess, MD;  Location: [facility] INVASIVE CV LAB;  Service: Cardiovascular;  Laterality: Bilateral;   ABDOMINAL HYSTERECTOMY  1970's   APPENDECTOMY  1970's   CARDIOVASCULAR STRESS TEST  06/16/2012   normal myocardial perfusion imaging, LV systolic function was normal without regional wall motion abnormalities, LV EF 60%   DOPPLER ECHOCARDIOGRAPHY  06/16/2012   EF 60-65%, mild concetric LVH, moderate aortic regurg   LOWER EXTREMITY ANGIOGRAM N/A 07/22/2012   Procedure: LOWER EXTREMITY ANGIOGRAM;  Surgeon: Runell Gess, MD;  Location: [facility] CATH LAB;  Service: Cardiovascular;  Laterality: N/A;   LOWER EXTREMITY ARTERIAL DOPPLER  08/08/2012   Left SFA stent-open and patent without evidence of hemodynamically significant stenosis   PERCUTANEOUS STENT INTERVENTION Left 07/22/2012   TOTAL THYROIDECTOMY  ~ 2010    Family History  Problem Relation Age of Onset   Hypertension Mother    Heart disease Mother     Social History:  reports that she has been smoking cigarettes. She started smoking about 60 years ago. She has a 22.5 pack-year smoking history. She has never used smokeless tobacco. She reports current alcohol use. She reports that she does not use drugs.  Allergies:  Allergies  Allergen Reactions   Lactose Intolerance (Gi) Diarrhea    Medications: I have reviewed the patient's current medications.  No results found for this or any previous visit (from the past 48 hour(s)).  No results found.  The PMH, PSH, Medications, Allergies, and SH were reviewed and updated.  ROS: Constitutional: Negative for fever, weight loss and weight gain. Cardiovascular: Negative for chest pain and dyspnea on exertion. Respiratory: Is not experiencing shortness of breath at rest. Gastrointestinal: Negative for nausea and vomiting. Neurological: Negative for headaches. Psychiatric: The patient is not nervous/anxiou  Blood pressure (!)  181/82, pulse 97, temperature 98.3 F (36.8 C), temperature source Oral, resp. rate (!) 22, height 5\' 2"  (1.575 m), weight 100 lb 9.6 oz (45.6 kg), SpO2 100%.  PHYSICAL EXAM:  Exam: General: Well-developed, well-nourished Respiratory Respiratory effort: Equal inspiration and expiration without stridor Cardiovascular Peripheral Vascular: Warm extremities with equal color/perfusion Eyes: No nystagmus with equal extraocular motion bilaterally Neuro/Psych/Balance: Patient oriented to person, place, and time; Appropriate mood and affect; Gait is intact with no imbalance; Cranial nerves I-XII are intact Head and Face Inspection: Normocephalic and atraumatic without mass or lesion Palpation: Facial skeleton intact without bony stepoffs Salivary Glands: No mass or tenderness Facial Strength: Facial motility symmetric and full bilaterally ENT Pinna: External ear intact and fully developed External canal: Canal is patent with intact skin Tympanic Membrane: Clear and mobile External Nose: No scar or anatomic deformity Internal Nose: see procedure note below  Lips, Teeth, and gums: Mucosa and teeth intact and viable TMJ: No pain to palpation with full mobility Oral cavity/oropharynx: No erythema or exudate, no lesions present Nasopharynx: No mass or lesion with intact mucosa Hypopharynx: Intact mucosa without pooling of secretions Neck Neck and Trachea: Midline trachea without mass or lesion Thyroid: No mass or nodularity Lymphatics: No lymphadenopathy  Procedure:  PROCEDURE NOTE: nasal endoscopy  Preoperative diagnosis: chronic nasal congestion and recurrent left sided epistaxis   Postoperative diagnosis: same  Procedure: Diagnostic nasal endoscopy (13086)  Surgeon: Ashok Croon, M.D.  Anesthesia: Topical lidocaine and Afrin  H&P REVIEW: The patient's history and physical were reviewed today prior to procedure. All medications were reviewed and updated as well. Complications:  None Condition is stable throughout exam Indications and consent: The patient presents with symptoms of chronic sinusitis not responding to previous therapies. All the risks, benefits, and potential complications were reviewed with the patient preoperatively and informed consent was obtained. The time out was completed with confirmation of the correct procedure.   Procedure: The patient was seated upright in the clinic. Topical lidocaine and Afrin were applied to the nasal cavity. After adequate anesthesia had occurred, the rigid nasal endoscope was passed into the nasal cavity. The nasal mucosa, turbinates, septum, and sinus drainage pathways were visualized bilaterally. This revealed no purulence or significant secretions that might be cultured. There were no polyps or sites of significant inflammation. The mucosa was intact and there no crusting present. The scope was then slowly withdrawn and the patient tolerated the procedure well. There were no complications or blood loss.   Findings: bilateral septal deviation. Mid septal spur on the left and caudal spur on the right, more severe obstruction on the left side, a prominent blood vessel left anterior septum but no ulcerated mucosa or active bleeding, no blood or clot, inferior turbinate hypertrophy b/l   Studies Reviewed:none  Assessment/Plan: Encounter Diagnoses  Name Primary?   Epistaxis Yes   Nasal congestion    Peripheral vascular disease (  HCC)    Hypertension, unspecified type    Nasal septal deviation    82 year old female with history of being 9 9 several antiplatelets due to history of peripheral vascular disease and stent procedures in the past current smoker but reports only social smoking at this time here for an episode of a nosebleed on the left side that required nasal packing in the ER few days ago.  She states that she does get nosebleeds but not frequently and her last nosebleed episode prior to the last 1 a few days ago was 3  years ago.  She states her nasal packing fell out 2 to 3 days after it was performed in the ER, no recurrent nosebleeds since then.  She had stopped antibiotics that were prescribed for prophylaxis when she had her nasal packing in place.   Her exam did not demonstrate any evidence of active or old nosebleed, but she likely has multiple risk factors for recurrent nosebleeds, including medications,  elevated blood pressure, for which we advised the patient to see her primary care provider and referred her to see family medicine after she reported that she does not have a current PCP. She also has septal deviation which can contribute to her risk of nose bleeds. She was counseled on epistaxis prevention.  Her nasal endoscopy did not demonstrate any polyps or lesions and I suspect that the likely source of her nosebleeds on the left side is a dilated prominent blood vessel along the anterior septum, which we will cauterize in the future if she develops another episode of a nosebleed.   Epistaxis prevention instructions:  - use saline spray x6/day and Vaseline twice a day to prevent nose bleeds - for active nose bleeds use afrin and anterior nose pressure x 10 min to stop them - if nose bleed does not stop with above measures, please go to ED if after hours  - please see your primary care provider to check your blood pressure and make sure it is under control - return for recurrent nose bleeds and we will consider cautery of your nasal blood vessels   Thank you for allowing me to participate in the care of this patient. Please do not hesitate to contact me with any questions or concerns.   Ashok Croon, MD Otolaryngology [facility] Phone: [phone] Fax: [phone]    11/23/2022, 1:06 PM

## 2022-11-23 NOTE — Patient Instructions (Signed)
-   use saline spray x6/day and Vaseline twice a day to prevent nose bleeds - for active nose bleeds use afrin and anterior nose pressure x 10 min to stop them - if nose bleed does not stop with above measures, please go to ED if after hours  - please see your primary care provider to check your blood pressure and make sure it is under control - return for recurrent nose bleeds and we will consider cautery of your nasal blood vessels

## 2023-04-28 ENCOUNTER — Other Ambulatory Visit (HOSPITAL_COMMUNITY): Payer: Self-pay | Admitting: Cardiovascular Disease

## 2023-04-28 DIAGNOSIS — I739 Peripheral vascular disease, unspecified: Secondary | ICD-10-CM

## 2023-05-03 ENCOUNTER — Inpatient Hospital Stay (HOSPITAL_BASED_OUTPATIENT_CLINIC_OR_DEPARTMENT_OTHER)
Admission: EM | Admit: 2023-05-03 | Discharge: 2023-05-19 | DRG: 291 | Disposition: A | Discharge status: Skilled Nursing Facility | Payer: Medicare Other | Attending: Family Medicine | Admitting: Family Medicine

## 2023-05-03 ENCOUNTER — Other Ambulatory Visit: Payer: Self-pay

## 2023-05-03 ENCOUNTER — Encounter (HOSPITAL_BASED_OUTPATIENT_CLINIC_OR_DEPARTMENT_OTHER): Payer: Self-pay

## 2023-05-03 ENCOUNTER — Emergency Department (HOSPITAL_BASED_OUTPATIENT_CLINIC_OR_DEPARTMENT_OTHER): Payer: Medicare Other

## 2023-05-03 DIAGNOSIS — Z7902 Long term (current) use of antithrombotics/antiplatelets: Secondary | ICD-10-CM

## 2023-05-03 DIAGNOSIS — Z95820 Peripheral vascular angioplasty status with implants and grafts: Secondary | ICD-10-CM

## 2023-05-03 DIAGNOSIS — I509 Heart failure, unspecified: Principal | ICD-10-CM

## 2023-05-03 DIAGNOSIS — R933 Abnormal findings on diagnostic imaging of other parts of digestive tract: Secondary | ICD-10-CM

## 2023-05-03 DIAGNOSIS — M1990 Unspecified osteoarthritis, unspecified site: Secondary | ICD-10-CM | POA: Diagnosis present

## 2023-05-03 DIAGNOSIS — Z66 Do not resuscitate: Secondary | ICD-10-CM | POA: Diagnosis not present

## 2023-05-03 DIAGNOSIS — F17210 Nicotine dependence, cigarettes, uncomplicated: Secondary | ICD-10-CM | POA: Diagnosis present

## 2023-05-03 DIAGNOSIS — Z955 Presence of coronary angioplasty implant and graft: Secondary | ICD-10-CM

## 2023-05-03 DIAGNOSIS — I5031 Acute diastolic (congestive) heart failure: Secondary | ICD-10-CM | POA: Diagnosis present

## 2023-05-03 DIAGNOSIS — K5730 Diverticulosis of large intestine without perforation or abscess without bleeding: Secondary | ICD-10-CM | POA: Diagnosis present

## 2023-05-03 DIAGNOSIS — J1008 Influenza due to other identified influenza virus with other specified pneumonia: Secondary | ICD-10-CM | POA: Diagnosis present

## 2023-05-03 DIAGNOSIS — N179 Acute kidney failure, unspecified: Secondary | ICD-10-CM

## 2023-05-03 DIAGNOSIS — J1081 Influenza due to other identified influenza virus with encephalopathy: Secondary | ICD-10-CM | POA: Diagnosis not present

## 2023-05-03 DIAGNOSIS — M79605 Pain in left leg: Secondary | ICD-10-CM | POA: Diagnosis present

## 2023-05-03 DIAGNOSIS — K2100 Gastro-esophageal reflux disease with esophagitis, without bleeding: Secondary | ICD-10-CM | POA: Diagnosis present

## 2023-05-03 DIAGNOSIS — I7121 Aneurysm of the ascending aorta, without rupture: Secondary | ICD-10-CM | POA: Diagnosis present

## 2023-05-03 DIAGNOSIS — E890 Postprocedural hypothyroidism: Secondary | ICD-10-CM | POA: Diagnosis present

## 2023-05-03 DIAGNOSIS — K449 Diaphragmatic hernia without obstruction or gangrene: Secondary | ICD-10-CM | POA: Diagnosis present

## 2023-05-03 DIAGNOSIS — F0390 Unspecified dementia without behavioral disturbance: Secondary | ICD-10-CM | POA: Diagnosis present

## 2023-05-03 DIAGNOSIS — I444 Left anterior fascicular block: Secondary | ICD-10-CM | POA: Diagnosis present

## 2023-05-03 DIAGNOSIS — E782 Mixed hyperlipidemia: Secondary | ICD-10-CM | POA: Diagnosis present

## 2023-05-03 DIAGNOSIS — R0902 Hypoxemia: Secondary | ICD-10-CM | POA: Diagnosis present

## 2023-05-03 DIAGNOSIS — Z681 Body mass index (BMI) 19 or less, adult: Secondary | ICD-10-CM

## 2023-05-03 DIAGNOSIS — Z79899 Other long term (current) drug therapy: Secondary | ICD-10-CM

## 2023-05-03 DIAGNOSIS — K121 Other forms of stomatitis: Secondary | ICD-10-CM | POA: Diagnosis not present

## 2023-05-03 DIAGNOSIS — D510 Vitamin B12 deficiency anemia due to intrinsic factor deficiency: Secondary | ICD-10-CM

## 2023-05-03 DIAGNOSIS — J1089 Influenza due to other identified influenza virus with other manifestations: Secondary | ICD-10-CM | POA: Diagnosis present

## 2023-05-03 DIAGNOSIS — J111 Influenza due to unidentified influenza virus with other respiratory manifestations: Secondary | ICD-10-CM | POA: Diagnosis not present

## 2023-05-03 DIAGNOSIS — R011 Cardiac murmur, unspecified: Secondary | ICD-10-CM | POA: Diagnosis present

## 2023-05-03 DIAGNOSIS — N1831 Chronic kidney disease, stage 3a: Secondary | ICD-10-CM | POA: Diagnosis present

## 2023-05-03 DIAGNOSIS — H5010 Unspecified exotropia: Secondary | ICD-10-CM | POA: Diagnosis present

## 2023-05-03 DIAGNOSIS — B370 Candidal stomatitis: Secondary | ICD-10-CM | POA: Diagnosis not present

## 2023-05-03 DIAGNOSIS — K0889 Other specified disorders of teeth and supporting structures: Secondary | ICD-10-CM | POA: Diagnosis present

## 2023-05-03 DIAGNOSIS — J09X1 Influenza due to identified novel influenza A virus with pneumonia: Secondary | ICD-10-CM | POA: Diagnosis not present

## 2023-05-03 DIAGNOSIS — I169 Hypertensive crisis, unspecified: Secondary | ICD-10-CM | POA: Diagnosis not present

## 2023-05-03 DIAGNOSIS — R471 Dysarthria and anarthria: Secondary | ICD-10-CM | POA: Diagnosis not present

## 2023-05-03 DIAGNOSIS — R1310 Dysphagia, unspecified: Secondary | ICD-10-CM

## 2023-05-03 DIAGNOSIS — D573 Sickle-cell trait: Secondary | ICD-10-CM | POA: Diagnosis present

## 2023-05-03 DIAGNOSIS — Z7901 Long term (current) use of anticoagulants: Secondary | ICD-10-CM

## 2023-05-03 DIAGNOSIS — Z9049 Acquired absence of other specified parts of digestive tract: Secondary | ICD-10-CM

## 2023-05-03 DIAGNOSIS — G9341 Metabolic encephalopathy: Secondary | ICD-10-CM | POA: Diagnosis not present

## 2023-05-03 DIAGNOSIS — Z515 Encounter for palliative care: Secondary | ICD-10-CM

## 2023-05-03 DIAGNOSIS — I2510 Atherosclerotic heart disease of native coronary artery without angina pectoris: Secondary | ICD-10-CM | POA: Diagnosis present

## 2023-05-03 DIAGNOSIS — I130 Hypertensive heart and chronic kidney disease with heart failure and stage 1 through stage 4 chronic kidney disease, or unspecified chronic kidney disease: Secondary | ICD-10-CM | POA: Diagnosis not present

## 2023-05-03 DIAGNOSIS — D7389 Other diseases of spleen: Secondary | ICD-10-CM | POA: Diagnosis present

## 2023-05-03 DIAGNOSIS — I739 Peripheral vascular disease, unspecified: Secondary | ICD-10-CM | POA: Diagnosis present

## 2023-05-03 DIAGNOSIS — K222 Esophageal obstruction: Secondary | ICD-10-CM

## 2023-05-03 DIAGNOSIS — G319 Degenerative disease of nervous system, unspecified: Secondary | ICD-10-CM | POA: Diagnosis present

## 2023-05-03 DIAGNOSIS — R09A2 Foreign body sensation, throat: Secondary | ICD-10-CM | POA: Diagnosis not present

## 2023-05-03 DIAGNOSIS — M6289 Other specified disorders of muscle: Secondary | ICD-10-CM | POA: Diagnosis present

## 2023-05-03 DIAGNOSIS — R7401 Elevation of levels of liver transaminase levels: Secondary | ICD-10-CM | POA: Diagnosis present

## 2023-05-03 DIAGNOSIS — Z7982 Long term (current) use of aspirin: Secondary | ICD-10-CM

## 2023-05-03 DIAGNOSIS — E876 Hypokalemia: Secondary | ICD-10-CM | POA: Diagnosis not present

## 2023-05-03 DIAGNOSIS — I10 Essential (primary) hypertension: Secondary | ICD-10-CM | POA: Diagnosis present

## 2023-05-03 DIAGNOSIS — K140 Glossitis: Secondary | ICD-10-CM | POA: Diagnosis not present

## 2023-05-03 DIAGNOSIS — M79604 Pain in right leg: Secondary | ICD-10-CM | POA: Diagnosis present

## 2023-05-03 DIAGNOSIS — J101 Influenza due to other identified influenza virus with other respiratory manifestations: Secondary | ICD-10-CM | POA: Diagnosis present

## 2023-05-03 DIAGNOSIS — Z90710 Acquired absence of both cervix and uterus: Secondary | ICD-10-CM

## 2023-05-03 DIAGNOSIS — D509 Iron deficiency anemia, unspecified: Secondary | ICD-10-CM | POA: Diagnosis present

## 2023-05-03 DIAGNOSIS — I080 Rheumatic disorders of both mitral and aortic valves: Secondary | ICD-10-CM | POA: Diagnosis present

## 2023-05-03 DIAGNOSIS — I352 Nonrheumatic aortic (valve) stenosis with insufficiency: Secondary | ICD-10-CM | POA: Diagnosis present

## 2023-05-03 DIAGNOSIS — M109 Gout, unspecified: Secondary | ICD-10-CM | POA: Diagnosis present

## 2023-05-03 DIAGNOSIS — M62838 Other muscle spasm: Secondary | ICD-10-CM | POA: Diagnosis present

## 2023-05-03 DIAGNOSIS — Z5320 Procedure and treatment not carried out because of patient's decision for unspecified reasons: Secondary | ICD-10-CM | POA: Diagnosis not present

## 2023-05-03 DIAGNOSIS — Z9889 Other specified postprocedural states: Secondary | ICD-10-CM

## 2023-05-03 DIAGNOSIS — R627 Adult failure to thrive: Secondary | ICD-10-CM | POA: Diagnosis present

## 2023-05-03 DIAGNOSIS — K219 Gastro-esophageal reflux disease without esophagitis: Secondary | ICD-10-CM

## 2023-05-03 DIAGNOSIS — Z8249 Family history of ischemic heart disease and other diseases of the circulatory system: Secondary | ICD-10-CM

## 2023-05-03 DIAGNOSIS — I6523 Occlusion and stenosis of bilateral carotid arteries: Secondary | ICD-10-CM | POA: Diagnosis present

## 2023-05-03 DIAGNOSIS — R9082 White matter disease, unspecified: Secondary | ICD-10-CM | POA: Diagnosis present

## 2023-05-03 DIAGNOSIS — F05 Delirium due to known physiological condition: Secondary | ICD-10-CM | POA: Diagnosis not present

## 2023-05-03 DIAGNOSIS — R634 Abnormal weight loss: Secondary | ICD-10-CM

## 2023-05-03 DIAGNOSIS — E739 Lactose intolerance, unspecified: Secondary | ICD-10-CM | POA: Diagnosis present

## 2023-05-03 DIAGNOSIS — B3781 Candidal esophagitis: Secondary | ICD-10-CM | POA: Diagnosis present

## 2023-05-03 DIAGNOSIS — I4891 Unspecified atrial fibrillation: Secondary | ICD-10-CM | POA: Diagnosis not present

## 2023-05-03 DIAGNOSIS — J129 Viral pneumonia, unspecified: Secondary | ICD-10-CM | POA: Diagnosis present

## 2023-05-03 DIAGNOSIS — R6881 Early satiety: Secondary | ICD-10-CM | POA: Diagnosis not present

## 2023-05-03 DIAGNOSIS — I712 Thoracic aortic aneurysm, without rupture, unspecified: Secondary | ICD-10-CM | POA: Diagnosis present

## 2023-05-03 DIAGNOSIS — E43 Unspecified severe protein-calorie malnutrition: Secondary | ICD-10-CM | POA: Insufficient documentation

## 2023-05-03 DIAGNOSIS — K146 Glossodynia: Secondary | ICD-10-CM | POA: Diagnosis not present

## 2023-05-03 LAB — RESP PANEL BY RT-PCR (RSV, FLU A&B, COVID)  RVPGX2
Influenza A by PCR: POSITIVE — AB
Influenza B by PCR: NEGATIVE
Resp Syncytial Virus by PCR: NEGATIVE
SARS Coronavirus 2 by RT PCR: NEGATIVE

## 2023-05-03 LAB — COMPREHENSIVE METABOLIC PANEL
ALT: 46 U/L — ABNORMAL HIGH (ref 0–44)
AST: 74 U/L — ABNORMAL HIGH (ref 15–41)
Albumin: 3.5 g/dL (ref 3.5–5.0)
Alkaline Phosphatase: 59 U/L (ref 38–126)
Anion gap: 11 (ref 5–15)
BUN: 26 mg/dL — ABNORMAL HIGH (ref 8–23)
CO2: 21 mmol/L — ABNORMAL LOW (ref 22–32)
Calcium: 9.3 mg/dL (ref 8.9–10.3)
Chloride: 111 mmol/L (ref 98–111)
Creatinine, Ser: 1.03 mg/dL — ABNORMAL HIGH (ref 0.44–1.00)
GFR, Estimated: 55 mL/min — ABNORMAL LOW (ref >60)
Glucose, Bld: 113 mg/dL — ABNORMAL HIGH (ref 70–99)
Potassium: 4.4 mmol/L (ref 3.5–5.1)
Sodium: 143 mmol/L (ref 135–145)
Total Bilirubin: 0.8 mg/dL (ref <1.2)
Total Protein: 7 g/dL (ref 6.5–8.1)

## 2023-05-03 LAB — CBC WITH DIFFERENTIAL/PLATELET
Abs Immature Granulocytes: 0.07 10*3/uL (ref 0.00–0.07)
Basophils Absolute: 0 10*3/uL (ref 0.0–0.1)
Basophils Relative: 0 %
Eosinophils Absolute: 0 10*3/uL (ref 0.0–0.5)
Eosinophils Relative: 0 %
HCT: 31 % — ABNORMAL LOW (ref 36.0–46.0)
Hemoglobin: 9.2 g/dL — ABNORMAL LOW (ref 12.0–15.0)
Immature Granulocytes: 1 %
Lymphocytes Relative: 9 %
Lymphs Abs: 0.8 10*3/uL (ref 0.7–4.0)
MCH: 20.6 pg — ABNORMAL LOW (ref 26.0–34.0)
MCHC: 29.7 g/dL — ABNORMAL LOW (ref 30.0–36.0)
MCV: 69.4 fL — ABNORMAL LOW (ref 80.0–100.0)
Monocytes Absolute: 1.6 10*3/uL — ABNORMAL HIGH (ref 0.1–1.0)
Monocytes Relative: 19 %
Neutro Abs: 6.1 10*3/uL (ref 1.7–7.7)
Neutrophils Relative %: 71 %
Platelets: 342 10*3/uL (ref 150–400)
RBC: 4.47 MIL/uL (ref 3.87–5.11)
RDW: 20.2 % — ABNORMAL HIGH (ref 11.5–15.5)
Smear Review: NORMAL
WBC: 8.5 10*3/uL (ref 4.0–10.5)
nRBC: 2.7 % — ABNORMAL HIGH (ref 0.0–0.2)

## 2023-05-03 LAB — BRAIN NATRIURETIC PEPTIDE: B Natriuretic Peptide: 4119.6 pg/mL — ABNORMAL HIGH (ref 0.0–100.0)

## 2023-05-03 LAB — OCCULT BLOOD X 1 CARD TO LAB, STOOL: Fecal Occult Bld: NEGATIVE

## 2023-05-03 MED ORDER — HYDRALAZINE HCL 20 MG/ML IJ SOLN
10.0000 mg | Freq: Four times a day (QID) | INTRAMUSCULAR | Status: DC | PRN
  Administered 2023-05-04: 10 mg via INTRAVENOUS
  Filled 2023-05-03: qty 1

## 2023-05-03 MED ORDER — ALBUTEROL SULFATE (2.5 MG/3ML) 0.083% IN NEBU: 2.5000 mg | INHALATION_SOLUTION | RESPIRATORY_TRACT | Status: DC | PRN

## 2023-05-03 MED ORDER — FUROSEMIDE 10 MG/ML IJ SOLN
60.0000 mg | Freq: Once | INTRAMUSCULAR | Status: AC
  Administered 2023-05-03: 60 mg via INTRAVENOUS
  Filled 2023-05-03: qty 6

## 2023-05-03 MED ORDER — FUROSEMIDE 10 MG/ML IJ SOLN: 40.0000 mg | Freq: Two times a day (BID) | INTRAMUSCULAR | Status: DC

## 2023-05-03 MED ORDER — SODIUM CHLORIDE 0.9 % IV BOLUS
1000.0000 mL | Freq: Once | INTRAVENOUS | Status: AC
  Administered 2023-05-03: 1000 mL via INTRAVENOUS

## 2023-05-03 MED ORDER — ALBUTEROL SULFATE HFA 108 (90 BASE) MCG/ACT IN AERS: 2.0000 | INHALATION_SPRAY | RESPIRATORY_TRACT | Status: DC | PRN

## 2023-05-03 MED ORDER — FUROSEMIDE 10 MG/ML IJ SOLN
20.0000 mg | Freq: Two times a day (BID) | INTRAMUSCULAR | Status: DC
Start: 2023-05-04 — End: 2023-05-05
  Administered 2023-05-04 – 2023-05-05 (×3): 20 mg via INTRAVENOUS
  Filled 2023-05-03 (×3): qty 2

## 2023-05-03 NOTE — ED Notes (Signed)
Called [facility] [address] x 2 to give report with no answer.  Will try again in a bit.

## 2023-05-03 NOTE — H&P (Incomplete)
History and Physical    Holly Guerra UJW:[id number] DOB: Aug 28, 1940 DOA: 05/03/2023  PCP: Coralee Rud, PA-C   Patient coming from: Home   Chief Complaint:  Chief Complaint  Patient presents with  . Weakness   ED TRIAGE note:  HPI:  Holly Guerra is a 82 y.o. female with medical history significant of aortic valvular disease, peripheral vascular disease status post stenting 07/2012, GERD, pernicious anemia and essential hypertension presented to emergency department evaluation for shortness of breath.  Patient reports increased shortness of breath for the past week as well as generalized weakness.  She does not use supplemental oxygen at baseline.  Endorse loose stools over the past couple of days as well without bright red blood. Stools are not dark and tarry either. She denies any recent sickness, fever, chest pain, nausea, vomiting, diarrhea, dysuria, or abdominal pain. No focal neurologic deficits, vision changes, slurred speech or dizziness. No additional complaints or concerns at this time.    ED Course:  At presentation to ED patient found to have elevated blood pressure 188/103, O2 sat 94% on room air currently 99% on 3 L.  Respiratory rate 25, heart rate 95. Elevated BNP 4119. Respiratory panel positive with influenza A. CBC hemoglobin 9.2 hematocrit 31, low MCV 69.  (baseline hemoglobin around 11-12).  Platelet count 342. CMP showing low bicarb 21, elevated creatinine 1.03, transaminitis.  Chest x-ray showing gross cardiomegaly with mild diffuse bilateral interstitial pulmonary opacity, consistent with edema and atypical/viral infection.  No focal airspace disease.  EKG shows sinus rhythm heart rate 90, short PR [place] and left anterior fascicular block.  In the ED patient has been treated with Lasix 60 mg IV once.  Hospitalist has been contacted for further management of concern for new onset of CHF, influenza A and AKI.   Significant labs in the ED: Lab Orders          Resp panel by RT-PCR (RSV, Flu A&B, Covid) Anterior Nasal Swab         Comprehensive metabolic panel         CBC with Differential         Urinalysis, w/ Reflex to Culture (Infection Suspected) -Urine, Clean Catch         Occult blood card to lab, stool         Brain natriuretic peptide         Procalcitonin         Comprehensive metabolic panel         CBC       Review of Systems:  ROS  Past Medical History:  Diagnosis Date  . Abdominal bruit   . Abnormal echocardiogram   . Anemia   . Aortic insufficiency   . Aortic root dilation (HCC)   . Aortic valve disorder   . Arthritis    "all over" (07/22/2012)  . Arthropathy   . Bilateral carotid artery stenosis   . Carotid artery disease (HCC)   . Chest discomfort   . Edema   . Exertional shortness of breath   . Fatigue   . GERD (gastroesophageal reflux disease)   . Gout   . Hypercholesteremia   . Hypertension   . Mitral regurgitation and aortic stenosis   . Mixed hyperlipidemia   . Murmur, cardiac   . Peripheral vascular disease (HCC)   . Pernicious anemia   . PVD (peripheral vascular disease) (HCC)   . Shortness of breath   . Sickle-cell trait (HCC)   .  Thoracic aortic aneurysm (HCC)   . Tobacco use   . URI (upper respiratory infection)     Past Surgical History:  Procedure Laterality Date  . ABDOMINAL AORTOGRAM W/LOWER EXTREMITY Bilateral 05/23/2020   Procedure: ABDOMINAL AORTOGRAM W/LOWER EXTREMITY;  Surgeon: Runell Gess, MD;  Location: [facility] INVASIVE CV LAB;  Service: Cardiovascular;  Laterality: Bilateral;  . ABDOMINAL HYSTERECTOMY  1970's  . APPENDECTOMY  1970's  . CARDIOVASCULAR STRESS TEST  06/16/2012   normal myocardial perfusion imaging, LV systolic function was normal without regional wall motion abnormalities, LV EF 60%  . DOPPLER ECHOCARDIOGRAPHY  06/16/2012   EF 60-65%, mild concetric LVH, moderate aortic regurg  . LOWER EXTREMITY ANGIOGRAM N/A 07/22/2012   Procedure: LOWER EXTREMITY ANGIOGRAM;   Surgeon: Runell Gess, MD;  Location: [facility] CATH LAB;  Service: Cardiovascular;  Laterality: N/A;  . LOWER EXTREMITY ARTERIAL DOPPLER  08/08/2012   Left SFA stent-open and patent without evidence of hemodynamically significant stenosis  . PERCUTANEOUS STENT INTERVENTION Left 07/22/2012  . TOTAL THYROIDECTOMY  ~ 2010     reports that she has been smoking cigarettes. She started smoking about 61 years ago. She has a 22.5 pack-year smoking history. She has never used smokeless tobacco. She reports current alcohol use. She reports that she does not use drugs.  Allergies  Allergen Reactions  . Lactose Intolerance (Gi) Diarrhea    Family History  Problem Relation Age of Onset  . Hypertension Mother   . Heart disease Mother     Prior to Admission medications   Medication Sig Start Date End Date Taking? Authorizing Provider  aspirin EC 81 MG tablet Take 1 tablet (81 mg total) by mouth daily. 07/24/12  Yes Sharol Harness, Brittainy M, PA-C  benazepril (LOTENSIN) 40 MG tablet Take 40 mg by mouth every evening.   Yes [provider]  allopurinol (ZYLOPRIM) 100 MG tablet Take 100 mg by mouth every evening.    [provider]  amLODipine (NORVASC) 10 MG tablet Take 1 tablet (10 mg total) by mouth daily. Patient taking differently: Take 10 mg by mouth every evening. 07/24/12   Robbie Lis M, PA-C  amoxicillin-clavulanate (AUGMENTIN) 875-125 MG tablet Take 1 tablet by mouth 2 (two) times daily. 11/16/22   Hedges, Tinnie Gens, PA-C  atorvastatin (LIPITOR) 80 MG tablet Take 80 mg by mouth every evening. 03/06/20   [provider]  azelastine (ASTELIN) 0.1 % nasal spray Place 2 sprays into both nostrils 2 (two) times daily as needed (allergies.). Use in each nostril as directed    [provider]  azelastine (OPTIVAR) 0.05 % ophthalmic solution 1 drop 2 (two) times daily.    [provider]  cephALEXin (KEFLEX) 500 MG capsule Take 1 capsule (500 mg total) by mouth 2  (two) times daily. 11/16/22   Zadie Rhine, MD  cilostazol (PLETAL) 100 MG tablet Take 100 mg by mouth 2 (two) times daily.    [provider]  clopidogrel (PLAVIX) 75 MG tablet Take 1 tablet (75 mg total) by mouth daily. Patient taking differently: Take 75 mg by mouth every evening. 07/24/12   Robbie Lis M, PA-C  cyanocobalamin (,VITAMIN B-12,) 1000 MCG/ML injection Inject 1,000 mg into the muscle every 30 (thirty) days. 03/08/20   [provider]  cyclobenzaprine (FLEXERIL) 10 MG tablet Take 10 mg by mouth at bedtime as needed (sleep).    [provider]  ferrous sulfate 325 (65 FE) MG tablet Take 325 mg by mouth daily.    [provider]  nebivolol (BYSTOLIC) 10 MG tablet Take 10 mg by mouth every evening.    [provider]  oxymetazoline (AFRIN NASAL SPRAY) 0.05 % nasal spray Place 1 spray into both nostrils 2 (two) times daily. Use only when you have an episode of a nose bleed and no longer than for 48 hrs 11/23/22   Ashok Croon, MD  pantoprazole (PROTONIX) 40 MG tablet Take 40 mg by mouth daily as needed (acid reflux/indigestion.).    [provider]  Propylene Glycol (SYSTANE COMPLETE) 0.6 % SOLN Place 1-2 drops into both eyes 3 (three) times daily as needed (dry/irritated eyes.).    [provider]  sodium chloride (OCEAN) 0.65 % SOLN nasal spray Place 1 spray into both nostrils as needed for congestion. 11/23/22   Ashok Croon, MD  sucralfate (CARAFATE) 1 g tablet Take 1 g by mouth 4 (four) times daily as needed (abdominal pain.).    [provider]  traMADol (ULTRAM) 50 MG tablet Take 50 mg by mouth every 12 (twelve) hours as needed (pain.). 07/25/15   [provider]  Vitamin D, Ergocalciferol, (DRISDOL) 1.25 MG (50000 UNIT) CAPS capsule Take 50,000 Units by mouth every 30 (thirty) days. 03/20/20   [provider]     Physical Exam: Vitals:   05/03/23 2009 05/03/23 2100 05/03/23  2200 05/03/23 2230  BP:  (!) 155/88 (!) 135/119 (!) 169/108  Pulse: 97 91 89 95  Resp: (!) 23 20 (!) 27 (!) 25  Temp:    98.8 F (37.1 C)  TempSrc:    Oral  SpO2: 90% 99% 92% 99%  Weight:      Height:        Physical Exam   Labs on Admission: I have personally reviewed following labs and imaging studies  CBC: Recent Labs  Lab 05/03/23 1557  WBC 8.5  NEUTROABS 6.1  HGB 9.2*  HCT 31.0*  MCV 69.4*  PLT 342   Basic Metabolic Panel: Recent Labs  Lab 05/03/23 1557  NA 143  K 4.4  CL 111  CO2 21*  GLUCOSE 113*  BUN 26*  CREATININE 1.03*  CALCIUM 9.3   GFR: Estimated Creatinine Clearance: 27.6 mL/min (A) (by C-G formula based on SCr of 1.03 mg/dL (H)). Liver Function Tests: Recent Labs  Lab 05/03/23 1557  AST 74*  ALT 46*  ALKPHOS 59  BILITOT 0.8  PROT 7.0  ALBUMIN 3.5   No results for input(s): "LIPASE", "AMYLASE" in the last 168 hours. No results for input(s): "AMMONIA" in the last 168 hours. Coagulation Profile: No results for input(s): "INR", "PROTIME" in the last 168 hours. Cardiac Enzymes: No results for input(s): "CKTOTAL", "CKMB", "CKMBINDEX", "TROPONINI", "TROPONINIHS" in the last 168 hours. BNP (last 3 results) Recent Labs    05/03/23 1557  BNP 4,119.6*   HbA1C: No results for input(s): "HGBA1C" in the last 72 hours. CBG: No results for input(s): "GLUCAP" in the last 168 hours. Lipid Profile: No results for input(s): "CHOL", "HDL", "LDLCALC", "TRIG", "CHOLHDL", "LDLDIRECT" in the last 72 hours. Thyroid Function Tests: No results for input(s): "TSH", "T4TOTAL", "FREET4", "T3FREE", "THYROIDAB" in the last 72 hours. Anemia Panel: No results for input(s): "VITAMINB12", "FOLATE", "FERRITIN", "TIBC", "IRON", "RETICCTPCT" in the last 72 hours. Urine analysis:    Component Value Date/Time   COLORURINE YELLOW 10/22/2021 1527   APPEARANCEUR CLEAR 10/22/2021 1527   LABSPEC 1.010 10/22/2021 1527   PHURINE 6.0 10/22/2021 1527   GLUCOSEU NEGATIVE  10/22/2021 1527   HGBUR TRACE (A) 10/22/2021 1527  BILIRUBINUR NEGATIVE 10/22/2021 1527   KETONESUR NEGATIVE 10/22/2021 1527   PROTEINUR 100 (A) 10/22/2021 1527   NITRITE NEGATIVE 10/22/2021 1527   LEUKOCYTESUR NEGATIVE 10/22/2021 1527    Radiological Exams on Admission: I have personally reviewed images DG Chest Portable 1 View Result Date: 05/03/2023 CLINICAL DATA:  Shortness of breath EXAM: PORTABLE CHEST 1 VIEW COMPARISON:  10/22/2021 FINDINGS: Gross cardiomegaly. Mild diffuse bilateral interstitial pulmonary opacity. The visualized skeletal structures are unremarkable. IMPRESSION: Gross cardiomegaly with mild diffuse bilateral interstitial pulmonary opacity, consistent with edema or atypical/viral infection. No focal airspace opacity. Electronically Signed   By: Jearld Lesch M.D.   On: 05/03/2023 17:18     EKG: My personal interpretation of EKG shows: EKG shows sinus rhythm heart rate 90, short PR [place]vale and left anterior fascicular block.     Assessment/Plan: Principal Problem:   New onset of congestive heart failure (HCC) Active Problems:   Influenza A with pneumonia   Hypertensive crisis   PVD (peripheral vascular disease) (HCC)   Essential hypertension   AKI (acute kidney injury) (HCC)   Microcytic anemia   GERD (gastroesophageal reflux disease)   Pernicious anemia    Assessment and Plan: No notes have been filed under this hospital service. Service: Hospitalist      DVT prophylaxis:  {Blank single:19197::"Lovenox","SQ Heparin","IV heparin gtts","Xarelto","Eliquis","Coumadin","SCDs","***"} Code Status:  {Blank single:19197::"Full Code","DNR with Intubation","DNR/DNI(Do NOT Intubate)","Comfort Care","***"} Diet:  Family Communication:  *** Family was present at bedside, at the time of interview.  Opportunity was given to ask question and all questions were answered satisfactorily.  Disposition Plan:  ***  Consults:  ***  Admission status:   {Blank  single:19197::"Observation","Inpatient"}, {Blank single:19197::"Med-Surg","Telemetry bed","Step Down Unit"}  Severity of Illness: {Observation/Inpatient:21159}    Tereasa Coop, MD [facility]  How to contact the [facility] Attending or Consulting provider 7A - 7P or covering provider during after hours 7P -7A, for this patient.  Check the care team in [facility] and look for a) attending/consulting [facility] provider listed and b) the [facility] team listed Log into [email or web address] and use [facility] universal password to access. If you do not have the password, please contact the hospital operator. Locate the [facility] provider you are looking for under [facility] and page to a number that you can be directly reached. If you still have difficulty reaching the provider, please page the [facility] (Director on Call) for the Hospitalists listed on amion for assistance.  05/04/2023, 12:04 AM

## 2023-05-03 NOTE — ED Notes (Signed)
Tried to call [address] [place] to given report

## 2023-05-03 NOTE — H&P (Signed)
History and Physical    Holly Guerra WUJ:[id number] DOB: Oct 27, 1940 DOA: 05/03/2023  PCP: Coralee Rud, PA-C   Patient coming from: Home   Chief Complaint:  Chief Complaint  Patient presents with   Weakness   ED TRIAGE note:  HPI:  Holly Guerra is a 82 y.o. female with medical history significant of aortic valvular disease, peripheral vascular disease status post stenting 07/2012, GERD, pernicious anemia and essential hypertension presented to emergency department evaluation for shortness of breath.  Patient reports increased shortness of breath for the past week as well as generalized weakness.  She does not use supplemental oxygen at baseline.  Endorse loose stools over the past couple of days as well without bright red blood. Stools are not dark and tarry either. She denies any recent sickness, fever, chest pain, nausea, vomiting, diarrhea, dysuria, or abdominal pain. No focal neurologic deficits, vision changes, slurred speech or dizziness. No additional complaints or concerns at this time.    ED Course:  At presentation to ED patient found to have elevated blood pressure 188/103, O2 sat 94% on room air currently 99% on 3 L.  Respiratory rate 25, heart rate 95. Elevated BNP 4119. Respiratory panel positive with influenza A. CBC hemoglobin 9.2 hematocrit 31, low MCV 69.  (baseline hemoglobin around 11-12).  Platelet count 342. CMP showing low bicarb 21, elevated creatinine 1.03, transaminitis.  Chest x-ray showing gross cardiomegaly with mild diffuse bilateral interstitial pulmonary opacity, consistent with edema and atypical/viral infection.  No focal airspace disease.  EKG shows sinus rhythm heart rate 90, short PR [place] and left anterior fascicular block.  In the ED patient has been treated with Lasix 60 mg IV once.  Hospitalist has been contacted for further management of concern for new onset of CHF, influenza A and AKI.   Significant labs in the ED: Lab Orders          Resp panel by RT-PCR (RSV, Flu A&B, Covid) Anterior Nasal Swab         Comprehensive metabolic panel         CBC with Differential         Urinalysis, w/ Reflex to Culture (Infection Suspected) -Urine, Clean Catch         Occult blood card to lab, stool         Brain natriuretic peptide         Procalcitonin         Comprehensive metabolic panel         CBC       Review of Systems:  Review of Systems  Constitutional:  Negative for chills, fever and weight loss.  Respiratory:  Positive for cough. Negative for hemoptysis, sputum production, shortness of breath and wheezing.   Cardiovascular:  Negative for chest pain and palpitations.  Musculoskeletal:  Negative for myalgias and neck pain.  Skin:  Negative for itching and rash.  Neurological:  Negative for dizziness and headaches.  Psychiatric/Behavioral:  The patient is not nervous/anxious.     Past Medical History:  Diagnosis Date   Abdominal bruit    Abnormal echocardiogram    Anemia    Aortic insufficiency    Aortic root dilation (HCC)    Aortic valve disorder    Arthritis    "all over" (07/22/2012)   Arthropathy    Bilateral carotid artery stenosis    Carotid artery disease (HCC)    Chest discomfort    Edema    Exertional shortness of breath  Fatigue    GERD (gastroesophageal reflux disease)    Gout    Hypercholesteremia    Hypertension    Mitral regurgitation and aortic stenosis    Mixed hyperlipidemia    Murmur, cardiac    Peripheral vascular disease (HCC)    Pernicious anemia    PVD (peripheral vascular disease) (HCC)    Shortness of breath    Sickle-cell trait (HCC)    Thoracic aortic aneurysm (HCC)    Tobacco use    URI (upper respiratory infection)     Past Surgical History:  Procedure Laterality Date   ABDOMINAL AORTOGRAM W/LOWER EXTREMITY Bilateral 05/23/2020   Procedure: ABDOMINAL AORTOGRAM W/LOWER EXTREMITY;  Surgeon: Runell Gess, MD;  Location: [facility]AS[facility];  Service:  Cardiovascular;  Laterality: Bilateral;   ABDOMINAL HYSTERECTOMY  1970's   APPENDECTOMY  1970's   CARDIOVASCULAR STRESS TEST  06/16/2012   normal myocardial perfusion imaging, LV systolic function was normal without regional wall motion abnormalities, LV EF 60%   DOPPLER ECHOCARDIOGRAPHY  06/16/2012   EF 60-65%, mild concetric LVH, moderate aortic regurg   LOWER EXTREMITY ANGIOGRAM N/A 07/22/2012   Procedure: LOWER EXTREMITY ANGIOGRAM;  Surgeon: Runell Gess, MD;  Location: [facility] CATH LAB;  Service: Cardiovascular;  Laterality: N/A;   LOWER EXTREMITY ARTERIAL DOPPLER  08/08/2012   Left SFA stent-open and patent without evidence of hemodynamically significant stenosis   PERCUTANEOUS STENT INTERVENTION Left 07/22/2012   TOTAL THYROIDECTOMY  ~ 2010     reports that she has been smoking cigarettes. She started smoking about 61 years ago. She has a 22.5 pack-year smoking history. She has never used smokeless tobacco. She reports current alcohol use. She reports that she does not use drugs.  Allergies  Allergen Reactions   Lactose Intolerance (Gi) Diarrhea    Family History  Problem Relation Age of Onset   Hypertension Mother    Heart disease Mother     Prior to Admission medications   Medication Sig Start Date End Date Taking? Authorizing Provider  aspirin EC 81 MG tablet Take 1 tablet (81 mg total) by mouth daily. 07/24/12  Yes Sharol Harness, Brittainy M, PA-C  benazepril (LOTENSIN) 40 MG tablet Take 40 mg by mouth every evening.   Yes [provider]  allopurinol (ZYLOPRIM) 100 MG tablet Take 100 mg by mouth every evening.    [provider]  amLODipine (NORVASC) 10 MG tablet Take 1 tablet (10 mg total) by mouth daily. Patient taking differently: Take 10 mg by mouth every evening. 07/24/12   Robbie Lis M, PA-C  amoxicillin-clavulanate (AUGMENTIN) 875-125 MG tablet Take 1 tablet by mouth 2 (two) times daily. 11/16/22   Hedges, Tinnie Gens, PA-C  atorvastatin (LIPITOR) 80 MG  tablet Take 80 mg by mouth every evening. 03/06/20   [provider]  azelastine (ASTELIN) 0.1 % nasal spray Place 2 sprays into both nostrils 2 (two) times daily as needed (allergies.). Use in each nostril as directed    [provider]  azelastine (OPTIVAR) 0.05 % ophthalmic solution 1 drop 2 (two) times daily.    [provider]  cephALEXin (KEFLEX) 500 MG capsule Take 1 capsule (500 mg total) by mouth 2 (two) times daily. 11/16/22   Zadie Rhine, MD  cilostazol (PLETAL) 100 MG tablet Take 100 mg by mouth 2 (two) times daily.    [provider]  clopidogrel (PLAVIX) 75 MG tablet Take 1 tablet (75 mg total) by mouth daily. Patient taking differently: Take 75 mg by mouth every  evening. 07/24/12   Robbie Lis M, PA-C  cyanocobalamin (,VITAMIN B-12,) 1000 MCG/ML injection Inject 1,000 mg into the muscle every 30 (thirty) days. 03/08/20   [provider]  cyclobenzaprine (FLEXERIL) 10 MG tablet Take 10 mg by mouth at bedtime as needed (sleep).    [provider]  ferrous sulfate 325 (65 FE) MG tablet Take 325 mg by mouth daily.    [provider]  nebivolol (BYSTOLIC) 10 MG tablet Take 10 mg by mouth every evening.    [provider]  oxymetazoline (AFRIN NASAL SPRAY) 0.05 % nasal spray Place 1 spray into both nostrils 2 (two) times daily. Use only when you have an episode of a nose bleed and no longer than for 48 hrs 11/23/22   Ashok Croon, MD  pantoprazole (PROTONIX) 40 MG tablet Take 40 mg by mouth daily as needed (acid reflux/indigestion.).    [provider]  Propylene Glycol (SYSTANE COMPLETE) 0.6 % SOLN Place 1-2 drops into both eyes 3 (three) times daily as needed (dry/irritated eyes.).    [provider]  sodium chloride (OCEAN) 0.65 % SOLN nasal spray Place 1 spray into both nostrils as needed for congestion. 11/23/22   Ashok Croon, MD  sucralfate (CARAFATE) 1 g tablet Take 1 g by mouth 4  (four) times daily as needed (abdominal pain.).    [provider]  traMADol (ULTRAM) 50 MG tablet Take 50 mg by mouth every 12 (twelve) hours as needed (pain.). 07/25/15   [provider]  Vitamin D, Ergocalciferol, (DRISDOL) 1.25 MG (50000 UNIT) CAPS capsule Take 50,000 Units by mouth every 30 (thirty) days. 03/20/20   [provider]     Physical Exam: Vitals:   05/03/23 2009 05/03/23 2100 05/03/23 2200 05/03/23 2230  BP:  (!) 155/88 (!) 135/119 (!) 169/108  Pulse: 97 91 89 95  Resp: (!) 23 20 (!) 27 (!) 25  Temp:    98.8 F (37.1 C)  TempSrc:    Oral  SpO2: 90% 99% 92% 99%  Weight:      Height:        Physical Exam Constitutional:      General: She is not in acute distress.    Appearance: She is not ill-appearing.  HENT:     Nose: Nose normal.     Mouth/Throat:     Mouth: Mucous membranes are moist.  Eyes:     Conjunctiva/sclera: Conjunctivae normal.  Cardiovascular:     Rate and Rhythm: Regular rhythm. Tachycardia present.     Pulses: Normal pulses.     Heart sounds: Normal heart sounds.  Pulmonary:     Effort: Pulmonary effort is normal.     Breath sounds: Normal breath sounds.  Abdominal:     General: Bowel sounds are normal. There is no distension.  Musculoskeletal:     Cervical back: Neck supple.     Right lower leg: No edema.     Left lower leg: No edema.  Skin:    General: Skin is warm and dry.     Capillary Refill: Capillary refill takes less than 2 seconds.  Neurological:     Mental Status: She is alert and oriented to person, place, and time.  Psychiatric:        Mood and Affect: Mood normal.        Thought Content: Thought content normal.      Labs on Admission: I have personally reviewed following labs and imaging studies  CBC: Recent Labs  Lab 05/03/23 1557  WBC 8.5  NEUTROABS 6.1  HGB 9.2*  HCT 31.0*  MCV 69.4*  PLT 342   Basic Metabolic Panel: Recent Labs  Lab 05/03/23 1557  NA 143  K 4.4  CL 111   CO2 21*  GLUCOSE 113*  BUN 26*  CREATININE 1.03*  CALCIUM 9.3   GFR: Estimated Creatinine Clearance: 27.6 mL/min (A) (by C-G formula based on SCr of 1.03 mg/dL (H)). Liver Function Tests: Recent Labs  Lab 05/03/23 1557  AST 74*  ALT 46*  ALKPHOS 59  BILITOT 0.8  PROT 7.0  ALBUMIN 3.5   No results for input(s): "LIPASE", "AMYLASE" in the last 168 hours. No results for input(s): "AMMONIA" in the last 168 hours. Coagulation Profile: No results for input(s): "INR", "PROTIME" in the last 168 hours. Cardiac Enzymes: No results for input(s): "CKTOTAL", "CKMB", "CKMBINDEX", "TROPONINI", "TROPONINIHS" in the last 168 hours. BNP (last 3 results) Recent Labs    05/03/23 1557  BNP 4,119.6*   HbA1C: No results for input(s): "HGBA1C" in the last 72 hours. CBG: No results for input(s): "GLUCAP" in the last 168 hours. Lipid Profile: No results for input(s): "CHOL", "HDL", "LDLCALC", "TRIG", "CHOLHDL", "LDLDIRECT" in the last 72 hours. Thyroid Function Tests: No results for input(s): "TSH", "T4TOTAL", "FREET4", "T3FREE", "THYROIDAB" in the last 72 hours. Anemia Panel: No results for input(s): "VITAMINB12", "FOLATE", "FERRITIN", "TIBC", "IRON", "RETICCTPCT" in the last 72 hours. Urine analysis:    Component Value Date/Time   COLORURINE YELLOW 10/22/2021 1527   APPEARANCEUR CLEAR 10/22/2021 1527   LABSPEC 1.010 10/22/2021 1527   PHURINE 6.0 10/22/2021 1527   GLUCOSEU NEGATIVE 10/22/2021 1527   HGBUR TRACE (A) 10/22/2021 1527   BILIRUBINUR NEGATIVE 10/22/2021 1527   KETONESUR NEGATIVE 10/22/2021 1527   PROTEINUR 100 (A) 10/22/2021 1527   NITRITE NEGATIVE 10/22/2021 1527   LEUKOCYTESUR NEGATIVE 10/22/2021 1527    Radiological Exams on Admission: I have personally reviewed images DG Chest Portable 1 View Result Date: 05/03/2023 CLINICAL DATA:  Shortness of breath EXAM: PORTABLE CHEST 1 VIEW COMPARISON:  10/22/2021 FINDINGS: Gross cardiomegaly. Mild diffuse bilateral  interstitial pulmonary opacity. The visualized skeletal structures are unremarkable. IMPRESSION: Gross cardiomegaly with mild diffuse bilateral interstitial pulmonary opacity, consistent with edema or atypical/viral infection. No focal airspace opacity. Electronically Signed   By: Jearld Lesch M.D.   On: 05/03/2023 17:18     EKG: My personal interpretation of EKG shows: EKG shows sinus rhythm heart rate 90, short PR [place] and left anterior fascicular block.   Assessment/Plan: Principal Problem:   New onset of congestive heart failure (HCC) Active Problems:   Influenza A with pneumonia   Hypertensive crisis   PVD (peripheral vascular disease) (HCC)   Essential hypertension   AKI (acute kidney injury) (HCC)   Microcytic anemia   GERD (gastroesophageal reflux disease)   Pernicious anemia    Assessment and Plan: New onset of congestive heart failure -Patient presented to emergency department complaining of shortness of breath which has been progressively worsening for last 1 week. -In the ED patient received Lasix 60 mg IV one-time dose. Found to have elevated BNP 4119. - Chest x-ray showed gross cardiomegaly with diffuse bilateral interstitial pulmonary opacity consistent with edema versus atypical/viral infection.  No focal airspace disease. -Seems like that patient has undiagnosed versus new onset of CHF. -Plan to continue Lasix 20 mg IV twice daily, continue to check urine output, and daily weight.  Strict I's/O --Continue home blood pressure regimen amlodipine and nebivolol.  Holding  benazepril in the setting of AKI. -Obtain echocardiogram and continue cardiac monitoring. - Continue check volume status on daily basis and adjust IV Lasix accordingly.   Influenza A associated viral pneumonia -Chest x-ray showed gross cardiomegaly with diffuse bilateral interstitial pulmonary opacity consistent with edema versus atypical/viral infection.  No focal airspace disease. -Patient is  afebrile no evidence of leukocytosis - Currently maintaining O2 sat 94 to 99% to liter oxygen.  Patient never desatted in the ED neither on the medicine floor.  Currently weaned down oxygen to room air O2 sat 100%. - Checking Pro-Cal level - Starting Tamiflu 75 mg daily for 5 days course. Holding any further IV antibiotic coverage given patient has no leukocytosis and afebrile. - Continue to check pulse ox and supplemental oxygen. -Albuterol as needed - Continue Mucinex as needed.  Hypertensive crisis Essential hypertension -Initial presentation elevated blood pressure 188/103 which has been improved to 135/119. - Resuming home nebivolol and amlodipine. - Continue as needed hydralazine.  Prerenal AKI-in the setting of CHF -Creatinine 1.03 on presentation.  Baseline creatinine around 0.8-0.9.  Prerenal acute kidney injury in the setting of CHF.  Currently managing for CHF with IV diuretics.  Monitor renal function and urine output.  Avoid nephrotoxic agents.  Peripheral vascular disease -Continue aspirin, Plavix and cilostazol.  GERD -Continue Protonix 40 mg daily.  History of pernicious anemia Microcytic anemia -Patient used to be on vitamin B12 shot in the past.  CBC showing low hemoglobin 9.2, low hematocrit 31 low MCV 69.  Baseline hemoglobin around 11-12. -Continue oral iron supplement. - Checking anemia panel.  Chronic muscle spasm and chronic lower extremity pain  -Continue Flexeril as needed and tramadol 50 mg as needed.   DVT prophylaxis:  SQ Heparin Code Status:  Full Code Diet: Heart healthy diet fluid restriction 2 L/day salt restriction 2 g/day Family Communication:   Family was present at bedside, at the time of interview.  Opportunity was given to ask question and all questions were answered satisfactorily.  Disposition Plan:   Consults:   Admission status:   Observation, progressive unit  Severity of Illness: The appropriate patient status for this patient  is OBSERVATION. Observation status is judged to be reasonable and necessary in order to provide the required intensity of service to ensure the patient's safety. The patient's presenting symptoms, physical exam findings, and initial radiographic and laboratory data in the context of their medical condition is felt to place them at decreased risk for further clinical deterioration. Furthermore, it is anticipated that the patient will be medically stable for discharge from the hospital within 2 midnights of admission.     Tereasa Coop, MD [facility]  How to contact the [facility] Attending or Consulting provider 7A - 7P or covering provider during after hours 7P -7A, for this patient.  Check the care team in [facility] and look for a) attending/consulting [facility] provider listed and b) the [facility] team listed Log into [email or web address] and use [place]'s universal password to access. If you do not have the password, please contact the hospital operator. Locate the [facility] provider you are looking for under [facility] and page to a number that you can be directly reached. If you still have difficulty reaching the provider, please page the [facility] (Director on Call) for the Hospitalists listed on amion for assistance.  05/04/2023, 12:15 AM

## 2023-05-03 NOTE — ED Notes (Signed)
[facility] called for transport, No ETA ED Nurse will call floor to give report Called @ 21:46

## 2023-05-03 NOTE — ED Provider Notes (Signed)
[place] [facility] Provider Note   CSN: [id number] Arrival date & time: 05/03/23  1500     History  Chief Complaint  Patient presents with   Weakness    Holly Guerra is a 82 y.o. female with a history of aortic valve disorder, GERD, pernicious anemia, and hypertension who presents the ED today for shortness of breath.  Patient reports increased shortness of breath for the past week as well as generalized weakness.  She does not use supplemental oxygen at baseline.  Endorse loose stools over the past couple of days as well without bright red blood. Stools are not dark and tarry either. She denies any recent sickness, fever, chest pain, nausea, vomiting, diarrhea, dysuria, or abdominal pain. No focal neurologic deficits, vision changes, slurred speech or dizziness. No additional complaints or concerns at this time.    Home Medications Prior to Admission medications   Medication Sig Start Date End Date Taking? Authorizing Provider  aspirin EC 81 MG tablet Take 1 tablet (81 mg total) by mouth daily. 07/24/12  Yes Sharol Harness, Brittainy M, PA-C  benazepril (LOTENSIN) 40 MG tablet Take 40 mg by mouth every evening.   Yes [provider]  allopurinol (ZYLOPRIM) 100 MG tablet Take 100 mg by mouth every evening.    [provider]  amLODipine (NORVASC) 10 MG tablet Take 1 tablet (10 mg total) by mouth daily. Patient taking differently: Take 10 mg by mouth every evening. 07/24/12   Robbie Lis M, PA-C  amoxicillin-clavulanate (AUGMENTIN) 875-125 MG tablet Take 1 tablet by mouth 2 (two) times daily. 11/16/22   Hedges, Tinnie Gens, PA-C  atorvastatin (LIPITOR) 80 MG tablet Take 80 mg by mouth every evening. 03/06/20   [provider]  azelastine (ASTELIN) 0.1 % nasal spray Place 2 sprays into both nostrils 2 (two) times daily as needed (allergies.). Use in each nostril as directed    [provider]  azelastine (OPTIVAR) 0.05 %  ophthalmic solution 1 drop 2 (two) times daily.    [provider]  cephALEXin (KEFLEX) 500 MG capsule Take 1 capsule (500 mg total) by mouth 2 (two) times daily. 11/16/22   Zadie Rhine, MD  cilostazol (PLETAL) 100 MG tablet Take 100 mg by mouth 2 (two) times daily.    [provider]  clopidogrel (PLAVIX) 75 MG tablet Take 1 tablet (75 mg total) by mouth daily. Patient taking differently: Take 75 mg by mouth every evening. 07/24/12   Robbie Lis M, PA-C  cyanocobalamin (,VITAMIN B-12,) 1000 MCG/ML injection Inject 1,000 mg into the muscle every 30 (thirty) days. 03/08/20   [provider]  cyclobenzaprine (FLEXERIL) 10 MG tablet Take 10 mg by mouth at bedtime as needed (sleep).    [provider]  ferrous sulfate 325 (65 FE) MG tablet Take 325 mg by mouth daily.    [provider]  nebivolol (BYSTOLIC) 10 MG tablet Take 10 mg by mouth every evening.    [provider]  oxymetazoline (AFRIN NASAL SPRAY) 0.05 % nasal spray Place 1 spray into both nostrils 2 (two) times daily. Use only when you have an episode of a nose bleed and no longer than for 48 hrs 11/23/22   Ashok Croon, MD  pantoprazole (PROTONIX) 40 MG tablet Take 40 mg by mouth daily as needed (acid reflux/indigestion.).    [provider]  Propylene Glycol (SYSTANE COMPLETE) 0.6 % SOLN Place 1-2 drops into both eyes 3 (three) times daily as needed (dry/irritated eyes.).  [provider]  sodium chloride (OCEAN) 0.65 % SOLN nasal spray Place 1 spray into both nostrils as needed for congestion. 11/23/22   Ashok Croon, MD  sucralfate (CARAFATE) 1 g tablet Take 1 g by mouth 4 (four) times daily as needed (abdominal pain.).    [provider]  traMADol (ULTRAM) 50 MG tablet Take 50 mg by mouth every 12 (twelve) hours as needed (pain.). 07/25/15   [provider]  Vitamin D, Ergocalciferol, (DRISDOL) 1.25 MG (50000 UNIT) CAPS capsule Take  50,000 Units by mouth every 30 (thirty) days. 03/20/20   [provider]      Allergies    Lactose intolerance (gi)    Review of Systems   Review of Systems  Neurological:  Positive for weakness.  All other systems reviewed and are negative.   Physical Exam Updated Vital Signs BP (!) 155/88   Pulse 91   Temp 97.7 F (36.5 C)   Resp 20   Ht 5\' 2"  (1.575 m)   Wt 40.8 kg   SpO2 99%   BMI 16.46 kg/m  Physical Exam Vitals and nursing note reviewed. Exam conducted with a chaperone present.  Constitutional:      General: She is not in acute distress.    Appearance: Normal appearance.  HENT:     Head: Normocephalic and atraumatic.     Mouth/Throat:     Mouth: Mucous membranes are moist.  Eyes:     Conjunctiva/sclera: Conjunctivae normal.     Pupils: Pupils are equal, round, and reactive to light.  Cardiovascular:     Rate and Rhythm: Normal rate and regular rhythm.     Pulses: Normal pulses.     Heart sounds: Normal heart sounds.  Pulmonary:     Effort: Pulmonary effort is normal.     Breath sounds: Normal breath sounds.  Abdominal:     Palpations: Abdomen is soft.     Tenderness: There is no abdominal tenderness.  Genitourinary:    Rectum: Guaiac result negative.  Skin:    General: Skin is warm and dry.     Findings: No rash.  Neurological:     General: No focal deficit present.     Mental Status: She is alert.     Sensory: No sensory deficit.     Motor: No weakness.  Psychiatric:        Mood and Affect: Mood normal.        Behavior: Behavior normal.    ED Results / Procedures / Treatments   Labs (all labs ordered are listed, but only abnormal results are displayed) Labs Reviewed  RESP PANEL BY RT-PCR (RSV, FLU A&B, COVID)  RVPGX2 - Abnormal; Notable for the following components:      Result Value   Influenza A by PCR POSITIVE (*)    All other components within normal limits  COMPREHENSIVE METABOLIC PANEL - Abnormal; Notable for the following  components:   CO2 21 (*)    Glucose, Bld 113 (*)    BUN 26 (*)    Creatinine, Ser 1.03 (*)    AST 74 (*)    ALT 46 (*)    GFR, Estimated 55 (*)    All other components within normal limits  CBC WITH DIFFERENTIAL/PLATELET - Abnormal; Notable for the following components:   Hemoglobin 9.2 (*)    HCT 31.0 (*)    MCV 69.4 (*)    MCH 20.6 (*)    MCHC 29.7 (*)    RDW 20.2 (*)  nRBC 2.7 (*)    Monocytes Absolute 1.6 (*)    All other components within normal limits  BRAIN NATRIURETIC PEPTIDE - Abnormal; Notable for the following components:   B Natriuretic Peptide 4,119.6 (*)    All other components within normal limits  OCCULT BLOOD X 1 CARD TO LAB, STOOL  URINALYSIS, W/ REFLEX TO CULTURE (INFECTION SUSPECTED)    EKG EKG Interpretation Date/Time:  Monday May 03 2023 15:33:10 EST Ventricular Rate:  90 PR Interval:  108 QRS Duration:  91 QT Interval:  414 QTC Calculation: 510 R Axis:   -78  Text Interpretation: Sinus rhythm Short PR interval Left anterior fascicular block Abnrm T, consider ischemia, anterolateral lds Prolonged QT interval Confirmed by Rolan Bucco [phone]) on 05/03/2023 3:44:17 PM  Radiology DG Chest Portable 1 View Result Date: 05/03/2023 CLINICAL DATA:  Shortness of breath EXAM: PORTABLE CHEST 1 VIEW COMPARISON:  10/22/2021 FINDINGS: Gross cardiomegaly. Mild diffuse bilateral interstitial pulmonary opacity. The visualized skeletal structures are unremarkable. IMPRESSION: Gross cardiomegaly with mild diffuse bilateral interstitial pulmonary opacity, consistent with edema or atypical/viral infection. No focal airspace opacity. Electronically Signed   By: Jearld Lesch M.D.   On: 05/03/2023 17:18    Procedures Procedures: not indicated.   Medications Ordered in ED Medications  albuterol (VENTOLIN HFA) 108 (90 Base) MCG/ACT inhaler 2 puff (has no administration in time range)  sodium chloride 0.9 % bolus 1,000 mL (0 mLs Intravenous Stopped 05/03/23  1850)  furosemide (LASIX) injection 60 mg (60 mg Intravenous Given 05/03/23 2124)    ED Course/ Medical Decision Making/ A&P                                 Medical Decision Making Amount and/or Complexity of Data Reviewed Labs: ordered. Radiology: ordered.  Risk Prescription drug management.   This patient presents to the ED for concern of shortness of breath, this involves an extensive number of treatment options, and is a complaint that carries with it a high risk of complications and morbidity.   Differential diagnosis includes: flu, COVID, RSV, other URI, pneumonia, pneumothorax, bronchitis, COPD/asthma exacerbation, sepsis, CHF, etc.   Comorbidities  See HPI above   Additional History  Additional history obtained from prior records.   Cardiac Monitoring / EKG  The patient was maintained on a cardiac monitor.  I personally viewed and interpreted the cardiac monitored which showed: sinus rhythm with short PR, heart rate of 89 bpm.   Lab Tests  I ordered and personally interpreted labs.  The pertinent results include:   BNP of 4,119.6 Positive for flu A CMP and CBC within normal limits - no acute electrolyte derangement, AKI, infection or anemia Negative FOBT UA is pending.   Imaging Studies  I ordered imaging studies including CXR  I independently visualized and interpreted imaging which showed: gross cardiomegaly with mild diffuse bilateral interstitial pulmonary opacity, consistent with edema or atypical/viral infection.  No focal airspace opacity. I agree with the radiologist interpretation   Consultations  I requested consultation with Dr. Julian Reil with [facility],  and discussed lab and imaging findings as well as pertinent plan - they recommend: admission for further evaluation and management.   Problem List / ED Course / Critical Interventions / Medication Management  Shortness of breath and generalized weakness x4 days Patient's oxygen  dropped to 90% so she was placed on 2 L nasal cannula I ordered medications including: Lasix for heart failure  Reevaluation of the patient after these medicines showed that the patient improved. Patient's lungs are CTAB and BP dropped from 191/116 to 149/88. I have reviewed the patients home medicines and have made adjustments as needed   Social Determinants of Health  Tobacco use   Test / Admission - Considered  Discussed findings with patient and family member at bedside. They are agreeable with plan for admission.        Final Clinical Impression(s) / ED Diagnoses Final diagnoses:  Acute heart failure, unspecified heart failure type [facility])  Flu    Rx / DC Orders ED Discharge Orders     None         Maxwell Marion, PA-C 05/03/23 2151    Rolan Bucco, MD 05/03/23 2337

## 2023-05-03 NOTE — Progress Notes (Signed)
Pt arrived

## 2023-05-03 NOTE — ED Notes (Signed)
Pt. Reports she is here due to feeling weak and not breathing well for the last week.  Pt. In no distress and has no noted skin break down.  Pt. Is alert and oriented.

## 2023-05-03 NOTE — ED Notes (Signed)
Tried to call [address] east to give report

## 2023-05-03 NOTE — ED Triage Notes (Addendum)
Pt reports SOB x 1 week and having loose stools for a week. Pt is very weak. Daughter notes that pt has increased memory loss as well

## 2023-05-04 ENCOUNTER — Observation Stay (HOSPITAL_COMMUNITY): Payer: Medicare Other

## 2023-05-04 ENCOUNTER — Encounter (HOSPITAL_COMMUNITY): Payer: Self-pay | Admitting: Internal Medicine

## 2023-05-04 DIAGNOSIS — F05 Delirium due to known physiological condition: Secondary | ICD-10-CM | POA: Diagnosis not present

## 2023-05-04 DIAGNOSIS — I4891 Unspecified atrial fibrillation: Secondary | ICD-10-CM | POA: Diagnosis not present

## 2023-05-04 DIAGNOSIS — I5021 Acute systolic (congestive) heart failure: Secondary | ICD-10-CM

## 2023-05-04 DIAGNOSIS — Z681 Body mass index (BMI) 19 or less, adult: Secondary | ICD-10-CM | POA: Diagnosis not present

## 2023-05-04 DIAGNOSIS — K140 Glossitis: Secondary | ICD-10-CM | POA: Diagnosis not present

## 2023-05-04 DIAGNOSIS — R627 Adult failure to thrive: Secondary | ICD-10-CM | POA: Diagnosis present

## 2023-05-04 DIAGNOSIS — R634 Abnormal weight loss: Secondary | ICD-10-CM | POA: Diagnosis not present

## 2023-05-04 DIAGNOSIS — I5031 Acute diastolic (congestive) heart failure: Secondary | ICD-10-CM | POA: Diagnosis present

## 2023-05-04 DIAGNOSIS — I130 Hypertensive heart and chronic kidney disease with heart failure and stage 1 through stage 4 chronic kidney disease, or unspecified chronic kidney disease: Secondary | ICD-10-CM | POA: Diagnosis present

## 2023-05-04 DIAGNOSIS — J1008 Influenza due to other identified influenza virus with other specified pneumonia: Secondary | ICD-10-CM | POA: Diagnosis present

## 2023-05-04 DIAGNOSIS — J111 Influenza due to unidentified influenza virus with other respiratory manifestations: Secondary | ICD-10-CM | POA: Diagnosis present

## 2023-05-04 DIAGNOSIS — B370 Candidal stomatitis: Secondary | ICD-10-CM | POA: Diagnosis not present

## 2023-05-04 DIAGNOSIS — I080 Rheumatic disorders of both mitral and aortic valves: Secondary | ICD-10-CM | POA: Diagnosis present

## 2023-05-04 DIAGNOSIS — D510 Vitamin B12 deficiency anemia due to intrinsic factor deficiency: Secondary | ICD-10-CM

## 2023-05-04 DIAGNOSIS — N179 Acute kidney failure, unspecified: Secondary | ICD-10-CM

## 2023-05-04 DIAGNOSIS — B3781 Candidal esophagitis: Secondary | ICD-10-CM | POA: Diagnosis present

## 2023-05-04 DIAGNOSIS — J1089 Influenza due to other identified influenza virus with other manifestations: Secondary | ICD-10-CM | POA: Diagnosis present

## 2023-05-04 DIAGNOSIS — E43 Unspecified severe protein-calorie malnutrition: Secondary | ICD-10-CM | POA: Diagnosis present

## 2023-05-04 DIAGNOSIS — I509 Heart failure, unspecified: Secondary | ICD-10-CM | POA: Diagnosis not present

## 2023-05-04 DIAGNOSIS — F17213 Nicotine dependence, cigarettes, with withdrawal: Secondary | ICD-10-CM | POA: Diagnosis not present

## 2023-05-04 DIAGNOSIS — I2510 Atherosclerotic heart disease of native coronary artery without angina pectoris: Secondary | ICD-10-CM | POA: Diagnosis not present

## 2023-05-04 DIAGNOSIS — Z66 Do not resuscitate: Secondary | ICD-10-CM | POA: Diagnosis not present

## 2023-05-04 DIAGNOSIS — N1831 Chronic kidney disease, stage 3a: Secondary | ICD-10-CM | POA: Diagnosis present

## 2023-05-04 DIAGNOSIS — I7121 Aneurysm of the ascending aorta, without rupture: Secondary | ICD-10-CM | POA: Diagnosis present

## 2023-05-04 DIAGNOSIS — Z7189 Other specified counseling: Secondary | ICD-10-CM | POA: Diagnosis not present

## 2023-05-04 DIAGNOSIS — R1310 Dysphagia, unspecified: Secondary | ICD-10-CM | POA: Diagnosis not present

## 2023-05-04 DIAGNOSIS — K219 Gastro-esophageal reflux disease without esophagitis: Secondary | ICD-10-CM

## 2023-05-04 DIAGNOSIS — I6523 Occlusion and stenosis of bilateral carotid arteries: Secondary | ICD-10-CM | POA: Diagnosis present

## 2023-05-04 DIAGNOSIS — D509 Iron deficiency anemia, unspecified: Secondary | ICD-10-CM | POA: Diagnosis present

## 2023-05-04 DIAGNOSIS — I5041 Acute combined systolic (congestive) and diastolic (congestive) heart failure: Secondary | ICD-10-CM | POA: Diagnosis not present

## 2023-05-04 DIAGNOSIS — Z7902 Long term (current) use of antithrombotics/antiplatelets: Secondary | ICD-10-CM | POA: Diagnosis not present

## 2023-05-04 DIAGNOSIS — F17210 Nicotine dependence, cigarettes, uncomplicated: Secondary | ICD-10-CM | POA: Diagnosis not present

## 2023-05-04 DIAGNOSIS — E890 Postprocedural hypothyroidism: Secondary | ICD-10-CM | POA: Diagnosis present

## 2023-05-04 DIAGNOSIS — G9341 Metabolic encephalopathy: Secondary | ICD-10-CM | POA: Diagnosis not present

## 2023-05-04 DIAGNOSIS — J129 Viral pneumonia, unspecified: Secondary | ICD-10-CM | POA: Diagnosis present

## 2023-05-04 DIAGNOSIS — K222 Esophageal obstruction: Secondary | ICD-10-CM | POA: Diagnosis not present

## 2023-05-04 DIAGNOSIS — I169 Hypertensive crisis, unspecified: Secondary | ICD-10-CM

## 2023-05-04 DIAGNOSIS — J101 Influenza due to other identified influenza virus with other respiratory manifestations: Secondary | ICD-10-CM | POA: Diagnosis present

## 2023-05-04 DIAGNOSIS — J1081 Influenza due to other identified influenza virus with encephalopathy: Secondary | ICD-10-CM | POA: Diagnosis not present

## 2023-05-04 DIAGNOSIS — J09X1 Influenza due to identified novel influenza A virus with pneumonia: Secondary | ICD-10-CM | POA: Diagnosis not present

## 2023-05-04 DIAGNOSIS — R933 Abnormal findings on diagnostic imaging of other parts of digestive tract: Secondary | ICD-10-CM | POA: Diagnosis not present

## 2023-05-04 DIAGNOSIS — F0390 Unspecified dementia without behavioral disturbance: Secondary | ICD-10-CM | POA: Diagnosis present

## 2023-05-04 DIAGNOSIS — Z515 Encounter for palliative care: Secondary | ICD-10-CM | POA: Diagnosis not present

## 2023-05-04 DIAGNOSIS — I10 Essential (primary) hypertension: Secondary | ICD-10-CM | POA: Diagnosis not present

## 2023-05-04 DIAGNOSIS — F419 Anxiety disorder, unspecified: Secondary | ICD-10-CM | POA: Diagnosis not present

## 2023-05-04 LAB — COMPREHENSIVE METABOLIC PANEL
ALT: 47 U/L — ABNORMAL HIGH (ref 0–44)
AST: 68 U/L — ABNORMAL HIGH (ref 15–41)
Albumin: 3.3 g/dL — ABNORMAL LOW (ref 3.5–5.0)
Alkaline Phosphatase: 56 U/L (ref 38–126)
Anion gap: 12 (ref 5–15)
BUN: 22 mg/dL (ref 8–23)
CO2: 22 mmol/L (ref 22–32)
Calcium: 9.1 mg/dL (ref 8.9–10.3)
Chloride: 111 mmol/L (ref 98–111)
Creatinine, Ser: 1.29 mg/dL — ABNORMAL HIGH (ref 0.44–1.00)
GFR, Estimated: 42 mL/min — ABNORMAL LOW (ref >60)
Glucose, Bld: 96 mg/dL (ref 70–99)
Potassium: 3.7 mmol/L (ref 3.5–5.1)
Sodium: 145 mmol/L (ref 135–145)
Total Bilirubin: 1.1 mg/dL (ref <1.2)
Total Protein: 6.7 g/dL (ref 6.5–8.1)

## 2023-05-04 LAB — CBC
HCT: 31.3 % — ABNORMAL LOW (ref 36.0–46.0)
Hemoglobin: 9.4 g/dL — ABNORMAL LOW (ref 12.0–15.0)
MCH: 21.1 pg — ABNORMAL LOW (ref 26.0–34.0)
MCHC: 30 g/dL (ref 30.0–36.0)
MCV: 70.2 fL — ABNORMAL LOW (ref 80.0–100.0)
Platelets: 348 10*3/uL (ref 150–400)
RBC: 4.46 MIL/uL (ref 3.87–5.11)
RDW: 20.1 % — ABNORMAL HIGH (ref 11.5–15.5)
WBC: 9.3 10*3/uL (ref 4.0–10.5)
nRBC: 3.3 % — ABNORMAL HIGH (ref 0.0–0.2)

## 2023-05-04 LAB — PROCALCITONIN: Procalcitonin: <0.1 ng/mL

## 2023-05-04 LAB — ECHOCARDIOGRAM COMPLETE
AR max vel: 0.88 cm2
AV Area VTI: 1.02 cm2
AV Area mean vel: 0.91 cm2
AV Mean grad: 18 mm[Hg]
AV Peak grad: 36.5 mm[Hg]
Ao pk vel: 3.02 m/s
Height: 62 in
P 1/2 time: 306 ms
S' Lateral: 2.5 cm
Single Plane A4C EF: 55.4 %
Weight: 1440 [oz_av]

## 2023-05-04 LAB — URINALYSIS, W/ REFLEX TO CULTURE (INFECTION SUSPECTED)
Bacteria, UA: NONE SEEN
Bilirubin Urine: NEGATIVE
Glucose, UA: NEGATIVE mg/dL
Ketones, ur: NEGATIVE mg/dL
Leukocytes,Ua: NEGATIVE
Nitrite: NEGATIVE
Protein, ur: NEGATIVE mg/dL
Specific Gravity, Urine: 1.005 (ref 1.005–1.030)
pH: 5 (ref 5.0–8.0)

## 2023-05-04 LAB — C-REACTIVE PROTEIN: CRP: 1.7 mg/dL — ABNORMAL HIGH (ref <1.0)

## 2023-05-04 MED ORDER — TRAMADOL HCL 50 MG PO TABS
50.0000 mg | ORAL_TABLET | Freq: Two times a day (BID) | ORAL | Status: DC | PRN
  Administered 2023-05-04 – 2023-05-19 (×13): 50 mg via ORAL
  Filled 2023-05-04 (×14): qty 1

## 2023-05-04 MED ORDER — SODIUM CHLORIDE 0.9% FLUSH
3.0000 mL | INTRAVENOUS | Status: DC | PRN
Start: 2023-05-04 — End: 2023-05-04

## 2023-05-04 MED ORDER — ONDANSETRON HCL 4 MG PO TABS
4.0000 mg | ORAL_TABLET | Freq: Four times a day (QID) | ORAL | Status: DC | PRN
  Filled 2023-05-04: qty 1

## 2023-05-04 MED ORDER — ONDANSETRON HCL 4 MG/2ML IJ SOLN: 4.0000 mg | Freq: Four times a day (QID) | INTRAMUSCULAR | Status: DC | PRN

## 2023-05-04 MED ORDER — HEPARIN SODIUM (PORCINE) 5000 UNIT/ML IJ SOLN
5000.0000 [IU] | Freq: Three times a day (TID) | INTRAMUSCULAR | Status: DC
  Administered 2023-05-04 – 2023-05-08 (×13): 5000 [IU] via SUBCUTANEOUS
  Filled 2023-05-04 (×13): qty 1

## 2023-05-04 MED ORDER — ATORVASTATIN CALCIUM 80 MG PO TABS
80.0000 mg | ORAL_TABLET | Freq: Every evening | ORAL | Status: DC
  Administered 2023-05-04 – 2023-05-08 (×5): 80 mg via ORAL
  Filled 2023-05-04 (×5): qty 1

## 2023-05-04 MED ORDER — ASPIRIN 81 MG PO TBEC
81.0000 mg | DELAYED_RELEASE_TABLET | Freq: Every day | ORAL | Status: DC
  Administered 2023-05-04 – 2023-05-16 (×13): 81 mg via ORAL
  Filled 2023-05-04 (×13): qty 1

## 2023-05-04 MED ORDER — AMLODIPINE BESYLATE 10 MG PO TABS
10.0000 mg | ORAL_TABLET | Freq: Every evening | ORAL | Status: DC
  Administered 2023-05-04 – 2023-05-07 (×5): 10 mg via ORAL
  Filled 2023-05-04 (×5): qty 1

## 2023-05-04 MED ORDER — ACETAMINOPHEN 650 MG RE SUPP: 650.0000 mg | Freq: Four times a day (QID) | RECTAL | Status: DC | PRN

## 2023-05-04 MED ORDER — PANTOPRAZOLE SODIUM 40 MG PO TBEC: 40.0000 mg | DELAYED_RELEASE_TABLET | Freq: Every day | ORAL | Status: DC | PRN

## 2023-05-04 MED ORDER — GUAIFENESIN ER 600 MG PO TB12
600.0000 mg | ORAL_TABLET | Freq: Two times a day (BID) | ORAL | Status: DC
  Administered 2023-05-04 – 2023-05-05 (×4): 600 mg via ORAL
  Filled 2023-05-04 (×4): qty 1

## 2023-05-04 MED ORDER — GUAIFENESIN ER 600 MG PO TB12: 600.0000 mg | ORAL_TABLET | Freq: Two times a day (BID) | ORAL | Status: DC | PRN

## 2023-05-04 MED ORDER — ACETAMINOPHEN 325 MG PO TABS
650.0000 mg | ORAL_TABLET | Freq: Four times a day (QID) | ORAL | Status: DC | PRN
  Administered 2023-05-04 – 2023-05-14 (×5): 650 mg via ORAL
  Filled 2023-05-04 (×7): qty 2

## 2023-05-04 MED ORDER — SODIUM CHLORIDE 0.9% FLUSH
3.0000 mL | Freq: Two times a day (BID) | INTRAVENOUS | Status: DC
  Administered 2023-05-04: 3 mL via INTRAVENOUS

## 2023-05-04 MED ORDER — SUCRALFATE 1 G PO TABS: 1.0000 g | ORAL_TABLET | Freq: Four times a day (QID) | ORAL | Status: DC | PRN

## 2023-05-04 MED ORDER — ALLOPURINOL 100 MG PO TABS
100.0000 mg | ORAL_TABLET | Freq: Every evening | ORAL | Status: DC
  Administered 2023-05-04 – 2023-05-18 (×15): 100 mg via ORAL
  Filled 2023-05-04 (×15): qty 1

## 2023-05-04 MED ORDER — CILOSTAZOL 100 MG PO TABS
100.0000 mg | ORAL_TABLET | Freq: Two times a day (BID) | ORAL | Status: DC
  Administered 2023-05-04 – 2023-05-05 (×4): 100 mg via ORAL
  Filled 2023-05-04 (×4): qty 1

## 2023-05-04 MED ORDER — CLOPIDOGREL BISULFATE 75 MG PO TABS
75.0000 mg | ORAL_TABLET | Freq: Every day | ORAL | Status: DC
  Administered 2023-05-04 – 2023-05-10 (×7): 75 mg via ORAL
  Filled 2023-05-04 (×7): qty 1

## 2023-05-04 MED ORDER — FERROUS SULFATE 325 (65 FE) MG PO TABS
325.0000 mg | ORAL_TABLET | Freq: Every day | ORAL | Status: DC
Start: 2023-05-04 — End: 2023-05-19
  Administered 2023-05-04 – 2023-05-19 (×16): 325 mg via ORAL
  Filled 2023-05-04 (×16): qty 1

## 2023-05-04 MED ORDER — DOCUSATE SODIUM 100 MG PO CAPS
100.0000 mg | ORAL_CAPSULE | Freq: Two times a day (BID) | ORAL | Status: DC
  Administered 2023-05-04 – 2023-05-19 (×29): 100 mg via ORAL
  Filled 2023-05-04 (×31): qty 1

## 2023-05-04 MED ORDER — ORAL CARE MOUTH RINSE: 15.0000 mL | OROMUCOSAL | Status: DC | PRN

## 2023-05-04 MED ORDER — EMPAGLIFLOZIN 10 MG PO TABS
10.0000 mg | ORAL_TABLET | Freq: Every day | ORAL | Status: DC
  Administered 2023-05-04 – 2023-05-08 (×5): 10 mg via ORAL
  Filled 2023-05-04 (×5): qty 1

## 2023-05-04 MED ORDER — OSELTAMIVIR PHOSPHATE 30 MG PO CAPS
30.0000 mg | ORAL_CAPSULE | Freq: Every day | ORAL | Status: AC
  Administered 2023-05-04 – 2023-05-08 (×5): 30 mg via ORAL
  Filled 2023-05-04 (×5): qty 1

## 2023-05-04 MED ORDER — SODIUM CHLORIDE 0.9 % IV SOLN
250.0000 mL | INTRAVENOUS | Status: DC | PRN
Start: 2023-05-04 — End: 2023-05-04

## 2023-05-04 MED ORDER — NEBIVOLOL HCL 10 MG PO TABS
10.0000 mg | ORAL_TABLET | Freq: Every evening | ORAL | Status: DC
  Administered 2023-05-04: 10 mg via ORAL
  Filled 2023-05-04 (×2): qty 1

## 2023-05-04 MED ORDER — CYCLOBENZAPRINE HCL 10 MG PO TABS
10.0000 mg | ORAL_TABLET | Freq: Every evening | ORAL | Status: DC | PRN
  Administered 2023-05-05 – 2023-05-18 (×7): 10 mg via ORAL
  Filled 2023-05-04 (×7): qty 1

## 2023-05-04 NOTE — Evaluation (Signed)
Occupational Therapy Evaluation Patient Details Name: Holly Guerra MRN: [id number] DOB: [birth year] Today's Date: 05/04/2023   History of Present Illness 82 y.o. female presents to [facility] hospital on 12.23.2024 with SOB. Pt admitted for management of new onset CHF. Pt also found to be positive for flu. PMH includes aortic valve disease, PVD, GERD, anemia, HTN.   Clinical Impression   PTA, pt lived with daughter and was mod I in ADL. Pt daughter works and present reporting pt with confusion and decreased mobility as compared to baseline; concerns for home. Upon eval, pt with decreased memory and problem solving. Pain in R ankle limiting activity tolerance. Believe once pain controlled will perform better, but due to safety concerns and limited assist at home, recommending inpatient rehab <3 hours/day.       If plan is discharge home, recommend the following: A little help with walking and/or transfers;A little help with bathing/dressing/bathroom;Assistance with cooking/housework;Assist for transportation;Help with stairs or ramp for entrance;Direct supervision/assist for medications management;Direct supervision/assist for financial management    Functional Status Assessment  Patient has had a recent decline in their functional status and demonstrates the ability to make significant improvements in function in a reasonable and predictable amount of time.  Equipment Recommendations  Other (comment) (defer)    Recommendations for Other Services       Precautions / Restrictions Precautions Precautions: Fall Restrictions Weight Bearing Restrictions Per Provider Order: No      Mobility Bed Mobility Overal bed mobility: Needs Assistance Bed Mobility: Supine to Sit, Sit to Supine     Supine to sit: Supervision Sit to supine: Supervision        Transfers Overall transfer level: Needs assistance Equipment used: None (pt refused RW) Transfers: Sit to/from Stand Sit to Stand: Min  assist           General transfer comment: Min A for STS and short distance ambulation. limited by R ankle pain      Balance Overall balance assessment: Needs assistance Sitting-balance support: No upper extremity supported, Feet supported Sitting balance-Leahy Scale: Good     Standing balance support: Bilateral upper extremity supported, Reliant on assistive device for balance Standing balance-Leahy Scale: Poor                             ADL either performed or assessed with clinical judgement   ADL Overall ADL's : Needs assistance/impaired Eating/Feeding: Modified independent   Grooming: Set up;Sitting   Upper Body Bathing: Set up;Sitting   Lower Body Bathing: Contact guard assist;Sitting/lateral leans   Upper Body Dressing : Set up;Sitting   Lower Body Dressing: Minimal assistance;Sit to/from stand   Toilet Transfer: Minimal assistance           Functional mobility during ADLs: Minimal assistance       Vision Baseline Vision/History: 1 Wears glasses Ability to See in Adequate Light: 0 Adequate Patient Visual Report: No change from baseline;Blurring of vision Additional Comments: Pt reports intermittent blurring of vision that has been ongoing for some time. apparently on medications for this but unsure of accuracy     Perception         Praxis         Pertinent Vitals/Pain Pain Assessment Pain Assessment: 0-10 Pain Score: 10-Worst pain ever Pain Location: RLE Pain Descriptors / Indicators: Aching Pain Intervention(s): Limited activity within patient's tolerance, Monitored during session, Heat applied     Extremity/Trunk Assessment Upper Extremity Assessment Upper Extremity  Assessment: Overall WFL for tasks assessed   Lower Extremity Assessment Lower Extremity Assessment: Generalized weakness   Cervical / Trunk Assessment Cervical / Trunk Assessment: Kyphotic   Communication Communication Communication: No apparent  difficulties   Cognition Arousal: Alert Behavior During Therapy: WFL for tasks assessed/performed Overall Cognitive Status: Impaired/Different from baseline Area of Impairment: Awareness, Problem solving, Memory                     Memory: Decreased short-term memory     Awareness: Emergent Problem Solving: Slow processing, Difficulty sequencing General Comments: daughter confirms off from baseline     General Comments  HR up to 124 with mobility    Exercises     Shoulder Instructions      Home Living Family/patient expects to be discharged to:: Private residence Living Arrangements: Children Available Help at Discharge: Family;Available PRN/intermittently Type of Home: House Home Access: Stairs to enter [employer] of Steps: 2 Entrance Stairs-Rails: None Home Layout: Two level;Laundry or work area in basement     [employer] Shower/Tub: Chief Strategy Officer: Standard     Home Equipment: [employer] - single point          Prior Functioning/Environment Prior Level of Function : Independent/Modified Independent;Driving             Mobility Comments: ambulatory without DME typically ADLs Comments: Pt typically mod I for ADL.        OT Problem List: Decreased strength;Decreased activity tolerance;Impaired balance (sitting and/or standing);Decreased cognition;Decreased safety awareness;Decreased knowledge of use of DME or AE;Impaired UE functional use      OT Treatment/Interventions: Self-care/ADL training;Therapeutic exercise;DME and/or AE instruction;Balance training;Patient/family education;Therapeutic activities;Cognitive remediation/compensation    OT Goals(Current goals can be found in the care plan section) Acute Rehab OT Goals Patient Stated Goal: go home OT Goal Formulation: With patient Time For Goal Achievement: 05/18/23 Potential to Achieve Goals: Good  OT Frequency: Min 1X/week    Co-evaluation               AM-PAC OT "6 Clicks" Daily Activity     Outcome Measure Help from another person eating meals?: None Help from another person taking care of personal grooming?: A Little Help from another person toileting, which includes using toliet, bedpan, or urinal?: A Lot Help from another person bathing (including washing, rinsing, drying)?: A Little Help from another person to put on and taking off regular upper body clothing?: A Little Help from another person to put on and taking off regular lower body clothing?: A Little 6 Click Score: 18   End of Session Equipment Utilized During Treatment: Gait belt Nurse Communication: Mobility status  Activity Tolerance: Patient limited by pain Patient left: in bed;with call bell/phone within reach;with bed alarm set  OT Visit Diagnosis: Unsteadiness on feet (R26.81);Muscle weakness (generalized) (M62.81);Other symptoms and signs involving cognitive function                Time: 1405-1430 OT Time Calculation (min): 25 min Charges:  OT General Charges $OT Visit: 1 Visit OT Evaluation $OT Eval Moderate Complexity: 1 Mod OT Treatments $Self Care/Home Management : 8-22 mins  Tyler Deis, OTR/L [facility] Acute Rehabilitation Office: [phone]   Holly Guerra 05/04/2023, 4:03 PM

## 2023-05-04 NOTE — Evaluation (Signed)
Physical Therapy Evaluation Patient Details Name: Holly Guerra MRN: [id number] DOB: [birth year] Today's Date: 05/04/2023  History of Present Illness  82 y.o. female presents to [facility] hospital on 12.23.2024 with SOB. Pt admitted for management of new onset CHF. Pt also found to be positive for flu. PMH includes aortic valve disease, PVD, GERD, anemia, HTN.  Clinical Impression  Pt presents to PT with deficits in functional mobility, gait, balance, power, strength, endurance. Pt is limited due to RLE pain on evaluation. Pt reports this pain is acute on chronic, citing PVD as the primary source. Pt is able to transfer but unable to ambulate for household distances due to this pain. Pt will benefit from frequent mobilization in an effort to improve activity tolerance. PT recommends discharge home with a RW pending continued progress.        If plan is discharge home, recommend the following: A little help with walking and/or transfers;A little help with bathing/dressing/bathroom;Assistance with cooking/housework;Assist for transportation;Help with stairs or ramp for entrance   Can travel by private vehicle        Equipment Recommendations Rolling walker (2 wheels)  Recommendations for Other Services       Functional Status Assessment Patient has had a recent decline in their functional status and demonstrates the ability to make significant improvements in function in a reasonable and predictable amount of time.     Precautions / Restrictions Precautions Precautions: Fall Restrictions Weight Bearing Restrictions Per Provider Order: No      Mobility  Bed Mobility Overal bed mobility: Needs Assistance Bed Mobility: Supine to Sit, Sit to Supine     Supine to sit: Supervision Sit to supine: Supervision        Transfers Overall transfer level: Needs assistance Equipment used: Rolling walker (2 wheels) Transfers: Sit to/from Stand Sit to Stand: Contact guard assist                 Ambulation/Gait Ambulation/Gait assistance: Contact guard assist Gait Distance (Feet): 4 Feet Assistive device: Rolling walker (2 wheels) Gait Pattern/deviations: Step-to pattern Gait velocity: reduced Gait velocity interpretation: <1.31 ft/sec, indicative of household ambulator   General Gait Details: slowed step-to gait, limited by reports of pain in RLE  Stairs            Wheelchair Mobility     Tilt Bed    Modified Rankin (Stroke Patients Only)       Balance Overall balance assessment: Needs assistance Sitting-balance support: No upper extremity supported, Feet supported Sitting balance-Leahy Scale: Good     Standing balance support: Bilateral upper extremity supported, Reliant on assistive device for balance Standing balance-Leahy Scale: Poor                               Pertinent Vitals/Pain Pain Assessment Pain Assessment: 0-10 Pain Score: 10-Worst pain ever Pain Location: RLE Pain Descriptors / Indicators: Aching Pain Intervention(s): Limited activity within patient's tolerance    Home Living Family/patient expects to be discharged to:: Private residence Living Arrangements: Children Available Help at Discharge: Family;Available PRN/intermittently Type of Home: House Home Access: Stairs to enter Entrance Stairs-Rails: None Entrance Stairs-Number of Steps: 2   Home Layout: Two level;Laundry or work area in [employer] Equipment: [employer] - single point      Prior Function Prior Level of Function : Independent/Modified Independent;Driving             Mobility Comments: ambulatory without DME typically  Extremity/Trunk Assessment   Upper Extremity Assessment Upper Extremity Assessment: Overall WFL for tasks assessed    Lower Extremity Assessment Lower Extremity Assessment: Generalized weakness    Cervical / Trunk Assessment Cervical / Trunk Assessment: Kyphotic  Communication   Communication Communication:  No apparent difficulties Cueing Techniques: Verbal cues  Cognition Arousal: Alert Behavior During Therapy: WFL for tasks assessed/performed Overall Cognitive Status: Within Functional Limits for tasks assessed                                          General Comments General comments (skin integrity, edema, etc.): tachycardic into 120s with activity    Exercises     Assessment/Plan    PT Assessment Patient needs continued PT services  PT Problem List Decreased strength;Decreased activity tolerance;Decreased mobility;Decreased balance;Decreased knowledge of use of DME;Pain       PT Treatment Interventions DME instruction;Gait training;Stair training;Functional mobility training;Therapeutic exercise;Therapeutic activities;Balance training;Neuromuscular re-education;Cognitive remediation;Patient/family education    PT Goals (Current goals can be found in the Care Plan section)  Acute Rehab PT Goals Patient Stated Goal: to reduce pain in RLE PT Goal Formulation: With patient Time For Goal Achievement: 05/18/23 Potential to Achieve Goals: Fair    Frequency Min 1X/week     Co-evaluation               AM-PAC PT "6 Clicks" Mobility  Outcome Measure Help needed turning from your back to your side while in a flat bed without using bedrails?: A Little Help needed moving from lying on your back to sitting on the side of a flat bed without using bedrails?: A Little Help needed moving to and from a bed to a chair (including a wheelchair)?: A Little Help needed standing up from a chair using your arms (e.g., wheelchair or bedside chair)?: A Little Help needed to walk in hospital room?: Total Help needed climbing 3-5 steps with a railing? : Total 6 Click Score: 14    End of Session Equipment Utilized During Treatment: Gait belt Activity Tolerance: Patient limited by pain Patient left: in bed;with call bell/phone within reach;with bed alarm set Nurse  Communication: Mobility status PT Visit Diagnosis: Other abnormalities of gait and mobility (R26.89);Muscle weakness (generalized) (M62.81);Pain Pain - Right/Left: Right Pain - part of body: Leg    Time: 6045-4098 PT Time Calculation (min) (ACUTE ONLY): 28 min   Charges:   PT Evaluation $PT Eval Low Complexity: 1 Low   PT General Charges $$ ACUTE PT VISIT: 1 Visit         Arlyss Gandy, PT, DPT Acute Rehabilitation Office [phone]   Arlyss Gandy 05/04/2023, 9:44 AM

## 2023-05-04 NOTE — Plan of Care (Signed)
  Problem: Education: Goal: Knowledge of General Education information will improve Description: Including pain rating scale, medication(s)/side effects and non-pharmacologic comfort measures Outcome: Progressing   Problem: Health Behavior/Discharge Planning: Goal: Ability to manage health-related needs will improve Outcome: Progressing   Problem: Clinical Measurements: Goal: Ability to maintain clinical measurements within normal limits will improve Outcome: Progressing Goal: Will remain free from infection Outcome: Progressing Goal: Diagnostic test results will improve Outcome: Progressing Goal: Respiratory complications will improve Outcome: Progressing Goal: Cardiovascular complication will be avoided Outcome: Progressing   Problem: Activity: Goal: Risk for activity intolerance will decrease Outcome: Progressing   Problem: Nutrition: Goal: Adequate nutrition will be maintained Outcome: Progressing   Problem: Coping: Goal: Level of anxiety will decrease Outcome: Progressing   Problem: Elimination: Goal: Will not experience complications related to bowel motility Outcome: Progressing Goal: Will not experience complications related to urinary retention Outcome: Progressing   Problem: Pain Management: Goal: General experience of comfort will improve Outcome: Progressing   Problem: Safety: Goal: Ability to remain free from injury will improve Outcome: Progressing   Problem: Skin Integrity: Goal: Risk for impaired skin integrity will decrease Outcome: Progressing   Problem: Education: Goal: Ability to demonstrate management of disease process will improve Outcome: Progressing Goal: Ability to verbalize understanding of medication therapies will improve Outcome: Progressing Goal: Individualized Educational Video(s) Outcome: Progressing   Problem: Activity: Goal: Capacity to carry out activities will improve Outcome: Progressing   Problem: Cardiac: Goal:  Ability to achieve and maintain adequate cardiopulmonary perfusion will improve Outcome: Progressing   Problem: Education: Goal: Ability to demonstrate management of disease process will improve Outcome: Progressing Goal: Ability to verbalize understanding of medication therapies will improve Outcome: Progressing Goal: Individualized Educational Video(s) Outcome: Progressing   Problem: Activity: Goal: Capacity to carry out activities will improve Outcome: Progressing   Problem: Cardiac: Goal: Ability to achieve and maintain adequate cardiopulmonary perfusion will improve Outcome: Progressing

## 2023-05-04 NOTE — Progress Notes (Signed)
  Echocardiogram 2D Echocardiogram has been performed.  Delcie Roch 05/04/2023, 10:05 AM

## 2023-05-04 NOTE — Progress Notes (Signed)
PROGRESS NOTE    Holly Guerra  UEA:[id number] DOB: 09-May-1941 DOA: 05/03/2023 PCP: Coralee Rud, PA-C  [name] with history of aortic insufficiency, peripheral vascular disease, GERD, essential hypertension, pernicious anemia presented to the ED yesterday with multiple symptoms, progressive shortness of breath X 1 week, some swelling, cough and orthopnea, in addition also complained of congestion sinus pain and headaches. -In the ED she was noted to be mildly hypoxic, BNP 4119, respiratory virus panel positive for influenza A, hemoglobin 9.2, creatinine 1.03, chest x-ray with mild diffuse bilateral interstitial pulmonary opacity consistent with edema and atypical/viral infection   Subjective: -Feels better, breathing is improving  Assessment and Plan:  New CHF -Presenting with dyspnea on exertion, orthopnea, mild edema, BNP 4119, concomitant influenza as well -Continue Lasix 20 Mg twice daily, add Jardiance, continue Bystolic -Echocardiogram today -Monitor urine output, BMP in a.m., wean O2    Influenza A  -Suspect x-ray findings secondary to pulmonary edema, improving on diuretics -Continue Tamiflu day 2/5, check CRP, supportive care   Uncontrolled hypertension Now improving, diuresis as above, amlodipine, Bystolic - Continue as needed hydralazine.   Peripheral vascular disease -Continue aspirin, Plavix and cilostazol.   GERD -Continue Protonix 40 mg daily.   History of pernicious anemia Microcytic anemia -Patient used to be on vitamin B12 shot in the past.  CBC showing low hemoglobin 9.2, low hematocrit 31 low MCV 69.  Baseline hemoglobin around 11-12. -Continue oral iron supplement. - Checking anemia panel.   Chronic muscle spasm and chronic lower extremity pain  -Continue Flexeril as needed and tramadol 50 mg as needed.     DVT prophylaxis:  SQ Heparin Code Status:  Full Code Family Communication: Discussed with patient detail, no family at bedside Disposition Plan:  Home pending above workup  Consultants:    Procedures:   Antimicrobials:    Objective: Vitals:   05/04/23 0423 05/04/23 0500 05/04/23 0704 05/04/23 0844  BP:   (!) 170/86 (!) 140/65  Pulse: 83  84   Resp:   18   Temp:   97.9 F (36.6 C)   TempSrc:   Oral   SpO2: 92%  94%   Weight:  40.8 kg    Height:        Intake/Output Summary (Last 24 hours) at 05/04/2023 1103 Last data filed at 05/04/2023 1040 Gross per 24 hour  Intake 1240.73 ml  Output 1980 ml  Net -739.27 ml   Filed Weights   05/03/23 1507 05/04/23 0500  Weight: 40.8 kg 40.8 kg    Examination:  General exam: Appears calm and comfortable  Respiratory system: Clear to auscultation Cardiovascular system: S1 & S2 heard, RRR.  Abd: nondistended, soft and nontender.Normal bowel sounds heard. Central nervous system: Alert and oriented. No focal neurological deficits. Extremities: no edema Skin: No rashes Psychiatry:  Mood & affect appropriate.     Data Reviewed:   CBC: Recent Labs  Lab 05/03/23 1557 05/04/23 0354  WBC 8.5 9.3  NEUTROABS 6.1  --   HGB 9.2* 9.4*  HCT 31.0* 31.3*  MCV 69.4* 70.2*  PLT 342 348   Basic Metabolic Panel: Recent Labs  Lab 05/03/23 1557 05/04/23 0354  NA 143 145  K 4.4 3.7  CL 111 111  CO2 21* 22  GLUCOSE 113* 96  BUN 26* 22  CREATININE 1.03* 1.29*  CALCIUM 9.3 9.1   GFR: Estimated Creatinine Clearance: 22 mL/min (A) (by C-G formula based on SCr of 1.29 mg/dL (H)). Liver Function Tests: Recent Labs  Lab  05/03/23 1557 05/04/23 0354  AST 74* 68*  ALT 46* 47*  ALKPHOS 59 56  BILITOT 0.8 1.1  PROT 7.0 6.7  ALBUMIN 3.5 3.3*   No results for input(s): "LIPASE", "AMYLASE" in the last 168 hours. No results for input(s): "AMMONIA" in the last 168 hours. Coagulation Profile: No results for input(s): "INR", "PROTIME" in the last 168 hours. Cardiac Enzymes: No results for input(s): "CKTOTAL", "CKMB", "CKMBINDEX", "TROPONINI" in the last 168 hours. BNP (last  3 results) No results for input(s): "PROBNP" in the last 8760 hours. HbA1C: No results for input(s): "HGBA1C" in the last 72 hours. CBG: No results for input(s): "GLUCAP" in the last 168 hours. Lipid Profile: No results for input(s): "CHOL", "HDL", "LDLCALC", "TRIG", "CHOLHDL", "LDLDIRECT" in the last 72 hours. Thyroid Function Tests: No results for input(s): "TSH", "T4TOTAL", "FREET4", "T3FREE", "THYROIDAB" in the last 72 hours. Anemia Panel: No results for input(s): "VITAMINB12", "FOLATE", "FERRITIN", "TIBC", "IRON", "RETICCTPCT" in the last 72 hours. Urine analysis:    Component Value Date/Time   COLORURINE COLORLESS (A) 05/03/2023 2342   APPEARANCEUR CLEAR 05/03/2023 2342   LABSPEC 1.005 05/03/2023 2342   PHURINE 5.0 05/03/2023 2342   GLUCOSEU NEGATIVE 05/03/2023 2342   HGBUR SMALL (A) 05/03/2023 2342   BILIRUBINUR NEGATIVE 05/03/2023 2342   KETONESUR NEGATIVE 05/03/2023 2342   PROTEINUR NEGATIVE 05/03/2023 2342   NITRITE NEGATIVE 05/03/2023 2342   LEUKOCYTESUR NEGATIVE 05/03/2023 2342   Sepsis Labs: @LABRCNTIP (procalcitonin:4,lacticidven:4)  ) Recent Results (from the past 240 hours)  Resp panel by RT-PCR (RSV, Flu A&B, Covid) Anterior Nasal Swab     Status: Abnormal   Collection Time: 05/03/23  3:57 PM   Specimen: Anterior Nasal Swab  Result Value Ref Range Status   SARS Coronavirus 2 by RT PCR NEGATIVE NEGATIVE Final    Comment: (NOTE) SARS-CoV-2 target nucleic acids are NOT DETECTED.  The SARS-CoV-2 RNA is generally detectable in upper respiratory specimens during the acute phase of infection. The lowest concentration of SARS-CoV-2 viral copies this assay can detect is 138 copies/mL. A negative result does not preclude SARS-Cov-2 infection and should not be used as the sole basis for treatment or other patient management decisions. A negative result may occur with  improper specimen collection/handling, submission of specimen other than nasopharyngeal swab,  presence of viral mutation(s) within the areas targeted by this assay, and inadequate number of viral copies(<138 copies/mL). A negative result must be combined with clinical observations, patient history, and epidemiological information. The expected result is Negative.  Fact Sheet for Patients:  BloggerCourse.com  Fact Sheet for Healthcare Providers:  SeriousBroker.it  This test is no t yet approved or cleared by the [place] FDA and  has been authorized for detection and/or diagnosis of SARS-CoV-2 by FDA under an Emergency Use Authorization (EUA). This EUA will remain  in effect (meaning this test can be used) for the duration of the COVID-19 declaration under Section 564(b)(1) of the Act, 21 U.S.C.section 360bbb-3(b)(1), unless the authorization is terminated  or revoked sooner.       Influenza A by PCR POSITIVE (A) NEGATIVE Final   Influenza B by PCR NEGATIVE NEGATIVE Final    Comment: (NOTE) The Xpert Xpress SARS-CoV-2/FLU/RSV plus assay is intended as an aid in the diagnosis of influenza from Nasopharyngeal swab specimens and should not be used as a sole basis for treatment. Nasal washings and aspirates are unacceptable for Xpert Xpress SARS-CoV-2/FLU/RSV testing.  Fact Sheet for Patients: BloggerCourse.com  Fact Sheet for Healthcare Providers: SeriousBroker.it  This  test is not yet approved or cleared by the [place] and has been authorized for detection and/or diagnosis of SARS-CoV-2 by FDA under an Emergency Use Authorization (EUA). This EUA will remain in effect (meaning this test can be used) for the duration of the COVID-19 declaration under Section 564(b)(1) of the Act, 21 U.S.C. section 360bbb-3(b)(1), unless the authorization is terminated or revoked.     Resp Syncytial Virus by PCR NEGATIVE NEGATIVE Final    Comment: (NOTE) Fact Sheet for  Patients: BloggerCourse.com  Fact Sheet for Healthcare Providers: SeriousBroker.it  This test is not yet approved or cleared by the [place] FDA and has been authorized for detection and/or diagnosis of SARS-CoV-2 by FDA under an Emergency Use Authorization (EUA). This EUA will remain in effect (meaning this test can be used) for the duration of the COVID-19 declaration under Section 564(b)(1) of the Act, 21 U.S.C. section 360bbb-3(b)(1), unless the authorization is terminated or revoked.  Performed at [facility], [address] [place], [place] [address]      Radiology Studies: DG Chest Portable 1 View Result Date: 05/03/2023 CLINICAL DATA:  Shortness of breath EXAM: PORTABLE CHEST 1 VIEW COMPARISON:  10/22/2021 FINDINGS: Gross cardiomegaly. Mild diffuse bilateral interstitial pulmonary opacity. The visualized skeletal structures are unremarkable. IMPRESSION: Gross cardiomegaly with mild diffuse bilateral interstitial pulmonary opacity, consistent with edema or atypical/viral infection. No focal airspace opacity. Electronically Signed   By: Jearld Lesch M.D.   On: 05/03/2023 17:18     Scheduled Meds:  allopurinol  100 mg Oral QPM   amLODipine  10 mg Oral QPM   aspirin EC  81 mg Oral Daily   atorvastatin  80 mg Oral QPM   cilostazol  100 mg Oral BID   clopidogrel  75 mg Oral Daily   docusate sodium  100 mg Oral BID   empagliflozin  10 mg Oral Daily   ferrous sulfate  325 mg Oral Daily   furosemide  20 mg Intravenous Q12H   guaiFENesin  600 mg Oral BID   heparin  5,000 Units Subcutaneous Q8H   nebivolol  10 mg Oral QPM   oseltamivir  30 mg Oral Daily   Continuous Infusions:   LOS: 0 days    Time spent:    Zannie Cove, MD [facility]   05/04/2023, 11:03 AM

## 2023-05-05 DIAGNOSIS — I10 Essential (primary) hypertension: Secondary | ICD-10-CM | POA: Diagnosis not present

## 2023-05-05 DIAGNOSIS — N179 Acute kidney failure, unspecified: Secondary | ICD-10-CM | POA: Diagnosis not present

## 2023-05-05 DIAGNOSIS — I509 Heart failure, unspecified: Secondary | ICD-10-CM | POA: Diagnosis not present

## 2023-05-05 DIAGNOSIS — J09X1 Influenza due to identified novel influenza A virus with pneumonia: Secondary | ICD-10-CM | POA: Diagnosis not present

## 2023-05-05 LAB — BASIC METABOLIC PANEL
Anion gap: 14 (ref 5–15)
BUN: 23 mg/dL (ref 8–23)
CO2: 23 mmol/L (ref 22–32)
Calcium: 8.8 mg/dL — ABNORMAL LOW (ref 8.9–10.3)
Chloride: 104 mmol/L (ref 98–111)
Creatinine, Ser: 1.34 mg/dL — ABNORMAL HIGH (ref 0.44–1.00)
GFR, Estimated: 40 mL/min — ABNORMAL LOW (ref >60)
Glucose, Bld: 117 mg/dL — ABNORMAL HIGH (ref 70–99)
Potassium: 2.8 mmol/L — ABNORMAL LOW (ref 3.5–5.1)
Sodium: 141 mmol/L (ref 135–145)

## 2023-05-05 MED ORDER — MAGNESIUM SULFATE 4 GM/100ML IV SOLN
4.0000 g | Freq: Once | INTRAVENOUS | Status: AC
  Administered 2023-05-05: 4 g via INTRAVENOUS
  Filled 2023-05-05: qty 100

## 2023-05-05 MED ORDER — POTASSIUM CHLORIDE CRYS ER 20 MEQ PO TBCR
40.0000 meq | EXTENDED_RELEASE_TABLET | ORAL | Status: AC
  Administered 2023-05-05 (×2): 40 meq via ORAL
  Filled 2023-05-05 (×2): qty 2

## 2023-05-05 MED ORDER — GUAIFENESIN-DM 100-10 MG/5ML PO SYRP
5.0000 mL | ORAL_SOLUTION | ORAL | Status: DC | PRN
  Administered 2023-05-08 – 2023-05-09 (×3): 5 mL via ORAL
  Filled 2023-05-05 (×4): qty 5

## 2023-05-05 MED ORDER — SPIRONOLACTONE 12.5 MG HALF TABLET
12.5000 mg | ORAL_TABLET | Freq: Every day | ORAL | Status: DC
Start: 2023-05-05 — End: 2023-05-08
  Administered 2023-05-05 – 2023-05-07 (×3): 12.5 mg via ORAL
  Filled 2023-05-05 (×3): qty 1

## 2023-05-05 NOTE — Plan of Care (Signed)
  Problem: Education: Goal: Knowledge of General Education information will improve Description: Including pain rating scale, medication(s)/side effects and non-pharmacologic comfort measures Outcome: Progressing   Problem: Health Behavior/Discharge Planning: Goal: Ability to manage health-related needs will improve Outcome: Progressing   Problem: Clinical Measurements: Goal: Ability to maintain clinical measurements within normal limits will improve Outcome: Progressing Goal: Will remain free from infection Outcome: Progressing Goal: Diagnostic test results will improve Outcome: Progressing Goal: Respiratory complications will improve Outcome: Progressing Goal: Cardiovascular complication will be avoided Outcome: Progressing   Problem: Activity: Goal: Risk for activity intolerance will decrease Outcome: Progressing   Problem: Nutrition: Goal: Adequate nutrition will be maintained Outcome: Progressing   Problem: Coping: Goal: Level of anxiety will decrease Outcome: Progressing   Problem: Elimination: Goal: Will not experience complications related to bowel motility Outcome: Progressing Goal: Will not experience complications related to urinary retention Outcome: Progressing   Problem: Pain Management: Goal: General experience of comfort will improve Outcome: Progressing   Problem: Safety: Goal: Ability to remain free from injury will improve Outcome: Progressing   Problem: Skin Integrity: Goal: Risk for impaired skin integrity will decrease Outcome: Progressing   Problem: Education: Goal: Ability to demonstrate management of disease process will improve Outcome: Progressing Goal: Ability to verbalize understanding of medication therapies will improve Outcome: Progressing Goal: Individualized Educational Video(s) Outcome: Progressing   Problem: Activity: Goal: Capacity to carry out activities will improve Outcome: Progressing   Problem: Cardiac: Goal:  Ability to achieve and maintain adequate cardiopulmonary perfusion will improve Outcome: Progressing   Problem: Education: Goal: Ability to demonstrate management of disease process will improve Outcome: Progressing Goal: Ability to verbalize understanding of medication therapies will improve Outcome: Progressing Goal: Individualized Educational Video(s) Outcome: Progressing   Problem: Activity: Goal: Capacity to carry out activities will improve Outcome: Progressing   Problem: Cardiac: Goal: Ability to achieve and maintain adequate cardiopulmonary perfusion will improve Outcome: Progressing

## 2023-05-05 NOTE — Assessment & Plan Note (Addendum)
Cell count has been stable.  Patient on B12 injections q 30 days.   Iron deficiency anemia, continue with oral iron supplementation.

## 2023-05-05 NOTE — Assessment & Plan Note (Signed)
Hypokalemia.   Improved volume status, renal function today with serum cr at 1.34 with K at 2,8 and serum bicarbonate at 23.  Na 141   Plan to hold on loop diuretic for now Add 40 meq Kcl x 2 doses.  Follow up renal function and electrolytes in am.  Continue SGLT 2 inh and will add spironolactone.

## 2023-05-05 NOTE — Hospital Course (Signed)
Mrs. Gundy was admitted to the hospital with the working diagnosis of acute heart failure in the setting of influenza A infection.   [name] with history of aortic insufficiency, peripheral vascular disease, GERD, essential hypertension, pernicious anemia presented to the ED yesterday with multiple symptoms, progressive shortness of breath X 1 week, some swelling, cough and orthopnea, in addition also complained of congestion sinus pain and headaches. -In the ED she was noted to be mildly hypoxic, BNP 4119, respiratory virus panel positive for influenza A, hemoglobin 9.2, creatinine 1.03,   Chest radiograph with cardiomegaly with bilateral hilar vascular congestion and cephalization of the vasculature.   EKG 89 bpm, left axis deviation, left anterior fascicular block, qtc 508, sinus rhythm with poor R R wave progression, no significant ST segment changes and negative T wave V5 and V6.

## 2023-05-05 NOTE — Assessment & Plan Note (Signed)
No signs of bacterial superinfection.   Plan to continue oseltamivir for antiviral therapy.

## 2023-05-05 NOTE — Progress Notes (Signed)
Pt had an episode of pauses of >1 min as well as sb ranging from the 20s-30s & back up to the 60s. RN contacted the pt's provider whom advised EKG. EKG showed a prolonged qtc of 625. Vs stable. Pt ordered IV mg as well as continued PO K replacement.   Sanda Linger, RN

## 2023-05-05 NOTE — Plan of Care (Signed)
  Problem: Education: Goal: Knowledge of General Education information will improve Description: Including pain rating scale, medication(s)/side effects and non-pharmacologic comfort measures Outcome: Progressing   Problem: Health Behavior/Discharge Planning: Goal: Ability to manage health-related needs will improve Outcome: Progressing   Problem: Clinical Measurements: Goal: Will remain free from infection Outcome: Progressing Goal: Respiratory complications will improve Outcome: Progressing   Problem: Activity: Goal: Risk for activity intolerance will decrease Outcome: Progressing   Problem: Nutrition: Goal: Adequate nutrition will be maintained Outcome: Not Progressing Note: Pt is not eating much even with encouragement from RN & family   Problem: Coping: Goal: Level of anxiety will decrease Outcome: Progressing   Problem: Elimination: Goal: Will not experience complications related to bowel motility Outcome: Progressing Goal: Will not experience complications related to urinary retention Outcome: Progressing   Problem: Pain Management: Goal: General experience of comfort will improve Outcome: Progressing   Problem: Safety: Goal: Ability to remain free from injury will improve Outcome: Progressing

## 2023-05-05 NOTE — Assessment & Plan Note (Signed)
Continue blood pressure control  Patient on aspirin, and clopidogrel Hold cilostazol in the setting of heart failure.

## 2023-05-05 NOTE — Assessment & Plan Note (Signed)
Continue blood pressure control with amlodipine and nebevilol.  Patient will benefit from ARB or ace inh when renal function more stable.

## 2023-05-05 NOTE — Progress Notes (Signed)
Progress Note   PatientTytiyana Guerra MVH:[id number] DOB: 05/26/1940 DOA: 05/03/2023     1 DOS: the patient was seen and examined on 05/05/2023   Brief hospital course: [name] was admitted to the hospital with the working diagnosis of acute heart failure in the setting of influenza A infection.   [name] with history of aortic insufficiency, peripheral vascular disease, GERD, essential hypertension, pernicious anemia presented to the ED yesterday with multiple symptoms, progressive shortness of breath X 1 week, some swelling, cough and orthopnea, in addition also complained of congestion sinus pain and headaches. -In the ED she was noted to be mildly hypoxic, BNP 4119, respiratory virus panel positive for influenza A, hemoglobin 9.2, creatinine 1.03,   Chest radiograph with cardiomegaly with bilateral hilar vascular congestion and cephalization of the vasculature.   EKG 89 bpm, left axis deviation, left anterior fascicular block, qtc 508, sinus rhythm with poor R R wave progression, no significant ST segment changes and negative T wave V5 and V6.   Assessment and Plan: * New onset of congestive heart failure (HCC) Echocardiogram with mild reduces LV systolic function 45 to 50%, consistent with diastolic heart failure.  Severe left ventricle hypertrophy of the inferior segment, RV systolic function preserved, LA with severe dilatation, mild mitral valve regurgitation, mild to moderate aortic insufficiency, moderate aortic stenosis.   LV hypokinetic mid and distal anterior wall, mid and distal lateral wall and distal anterior segment, mid and distal inferior wall, mid antero lateral segment and mid inferoseptal segment.   Patient with improvement in volume status, her body weight has decreased 2 Kg since admission. Her blood pressure is 130 mmHg range.   Plan to hold on loop diuretic therapy for now.  Continue SGLT 2 inh and will add spironolactone  Follow up renal function and blood  pressure.   Influenza A with pneumonia No signs of bacterial superinfection.   Plan to continue oseltamivir for antiviral therapy.   AKI (acute kidney injury) (HCC) Hypokalemia.   Improved volume status, renal function today with serum cr at 1.34 with K at 2,8 and serum bicarbonate at 23.  Na 141   Plan to hold on loop diuretic for now Add 40 meq Kcl x 2 doses.  Follow up renal function and electrolytes in am.  Continue SGLT 2 inh and will add spironolactone.   Essential hypertension Continue blood pressure control with amlodipine and nebevilol.  Patient will benefit from ARB or ace inh when renal function more stable.   Pernicious anemia Cell count has been stable.  Patient on B12 injections q 30 days.   Iron deficiency anemia, continue with oral iron supplementation.   GERD (gastroesophageal reflux disease) Continue with pantoprazole.   PVD (peripheral vascular disease) (HCC) Continue blood pressure control  Patient on aspirin, and clopidogrel Hold cilostazol in the setting of heart failure.         Subjective: Patient is feeling better, her dyspnea has improved, no chest pain, no lower extremity edema, no PND or orthopnea.   Physical Exam: Vitals:   05/04/23 1545 05/04/23 2032 05/05/23 0501 05/05/23 0725  BP: 120/69 (!) 111/52 (!) 130/59   Pulse: 68  60 61  Resp:  18 18   Temp: (!) 97.4 F (36.3 C) (!) 97.1 F (36.2 C) (!) 97.2 F (36.2 C)   TempSrc: Oral Oral Oral   SpO2: 100%  95% 94%  Weight:   38.7 kg   Height:       Neurology awake and alert  ENT with mild pallor with no icterus Cardiovascular with S1 and S2 present and regular, positive systolic murmur at the base, no gallops No JVD No lower extremity edema Respiratory with bilateral rhonchi with deep inspiration with no wheezing or rales Abdomen with no distention  Data Reviewed:    Family Communication: no family at the bedside   Disposition: Status is: Inpatient Remains inpatient  appropriate because: follow up renal function and electrolytes   Planned Discharge Destination: Home     Author: Coralie Keens, MD 05/05/2023 11:24 AM  For on call review [email or web address].

## 2023-05-05 NOTE — Assessment & Plan Note (Signed)
Echocardiogram with mild reduces LV systolic function 45 to 50%, consistent with diastolic heart failure.  Severe left ventricle hypertrophy of the inferior segment, RV systolic function preserved, LA with severe dilatation, mild mitral valve regurgitation, mild to moderate aortic insufficiency, moderate aortic stenosis.   LV hypokinetic mid and distal anterior wall, mid and distal lateral wall and distal anterior segment, mid and distal inferior wall, mid antero lateral segment and mid inferoseptal segment.   Patient with improvement in volume status, her body weight has decreased 2 Kg since admission. Her blood pressure is 130 mmHg range.   Plan to hold on loop diuretic therapy for now.  Continue SGLT 2 inh and will add spironolactone  Follow up renal function and blood pressure.

## 2023-05-05 NOTE — Assessment & Plan Note (Signed)
Continue with pantoprazole/.  

## 2023-05-06 ENCOUNTER — Encounter (HOSPITAL_COMMUNITY): Payer: Self-pay | Admitting: *Deleted

## 2023-05-06 DIAGNOSIS — I509 Heart failure, unspecified: Secondary | ICD-10-CM | POA: Diagnosis not present

## 2023-05-06 LAB — BASIC METABOLIC PANEL
Anion gap: 13 (ref 5–15)
BUN: 24 mg/dL — ABNORMAL HIGH (ref 8–23)
CO2: 23 mmol/L (ref 22–32)
Calcium: 9.2 mg/dL (ref 8.9–10.3)
Chloride: 108 mmol/L (ref 98–111)
Creatinine, Ser: 1.31 mg/dL — ABNORMAL HIGH (ref 0.44–1.00)
GFR, Estimated: 41 mL/min — ABNORMAL LOW (ref >60)
Glucose, Bld: 103 mg/dL — ABNORMAL HIGH (ref 70–99)
Potassium: 3.8 mmol/L (ref 3.5–5.1)
Sodium: 144 mmol/L (ref 135–145)

## 2023-05-06 LAB — MAGNESIUM: Magnesium: 2.8 mg/dL — ABNORMAL HIGH (ref 1.7–2.4)

## 2023-05-06 MED ORDER — PHENOL 1.4 % MT LIQD
1.0000 | OROMUCOSAL | Status: DC | PRN
  Administered 2023-05-06: 1 via OROMUCOSAL
  Filled 2023-05-06 (×2): qty 177

## 2023-05-06 MED ORDER — FUROSEMIDE 20 MG PO TABS
20.0000 mg | ORAL_TABLET | Freq: Every day | ORAL | Status: DC
  Administered 2023-05-07: 20 mg via ORAL
  Filled 2023-05-06: qty 1

## 2023-05-06 NOTE — Plan of Care (Signed)
  Problem: Clinical Measurements: Goal: Ability to maintain clinical measurements within normal limits will improve Outcome: Progressing Goal: Will remain free from infection Outcome: Progressing Goal: Diagnostic test results will improve Outcome: Progressing Goal: Respiratory complications will improve Outcome: Progressing   Problem: Nutrition: Goal: Adequate nutrition will be maintained Outcome: Progressing   Problem: Coping: Goal: Level of anxiety will decrease Outcome: Progressing   Problem: Elimination: Goal: Will not experience complications related to bowel motility Outcome: Progressing Goal: Will not experience complications related to urinary retention Outcome: Progressing   Problem: Pain Management: Goal: General experience of comfort will improve Outcome: Progressing   Problem: Safety: Goal: Ability to remain free from injury will improve Outcome: Progressing   Problem: Skin Integrity: Goal: Risk for impaired skin integrity will decrease Outcome: Progressing

## 2023-05-06 NOTE — Progress Notes (Signed)
Physical Therapy Treatment Patient Details Name: Holly Guerra MRN: [id number] DOB: 1940-09-06 Today's Date: 05/06/2023   History of Present Illness 82 y.o. female presents to [facility] hospital on 12.23.2024 with SOB. Pt admitted for management of new onset CHF. Pt also found to be positive for flu. PMH includes aortic valve disease, PVD, GERD, anemia, HTN.    PT Comments  Patient resting seated at EOB at start of session and agreeable to mobilize with therapy. Min HHA for sit<>stand from EOB and pt attempted small steps forward with HHA and min-mod assist to steady. Pt returned to EOB and RW provided. Cues for technique with sit<>stand with RW and Min assist to steady rise. Pt amb short bout in room to sink and chair provided for seated rest break and pt completed self care washing at sink. Pt amb shorter bout back to EOB. Throughout gait Rt LE dragging and assist needed to weight shift Rt/Lt for LE advancement; pt c/o Rt ankle/foot pain throughout and reported RW improved pressure reduction on Rt LE. EOS pt remained seated EOB, Alarm on and call bell within reach. Will continue to progress pt as able.    If plan is discharge home, recommend the following: A little help with walking and/or transfers;A little help with bathing/dressing/bathroom;Assistance with cooking/housework;Assist for transportation;Help with stairs or ramp for entrance;Direct supervision/assist for medications management   Can travel by private vehicle     Yes  Equipment Recommendations  Rolling walker (2 wheels) (defer to next venue)    Recommendations for Other Services       Precautions / Restrictions Precautions Precautions: Fall Restrictions Weight Bearing Restrictions Per Provider Order: No     Mobility  Bed Mobility               General bed mobility comments: pt seated EOB at start and EOS    Transfers Overall transfer level: Needs assistance Equipment used: None Transfers: Sit to/from Stand Sit to  Stand: Min assist, Contact guard assist           General transfer comment: cues for safe hand placement on for power up from EOB. min assist to rise and steady. cues for reach back however pt with no attempt to reach back to standard chair or bed for sitting. CGA for power up from standard chair with armrests.    Ambulation/Gait Ambulation/Gait assistance: Min assist Gait Distance (Feet): 8 Feet (8, 4) Assistive device: 1 person hand held assist, Rolling walker (2 wheels) Gait Pattern/deviations: Step-to pattern, Decreased step length - right, Decreased stance time - right, Decreased dorsiflexion - right, Trunk flexed, Narrow base of support Gait velocity: decr     General Gait Details: Pt with slight Rt lean and Rt LE lagging behind with each step. pt required assist to weight shift Rt/Lt for stepping. pt amb short bout in room from EOB to sink, chair provided for seat. pt amb shortened distance back to bed as chair turned and moved closer.   Stairs             Wheelchair Mobility     Tilt Bed    Modified Rankin (Stroke Patients Only)       Balance Overall balance assessment: Needs assistance Sitting-balance support: No upper extremity supported, Feet supported Sitting balance-Leahy Scale: Good     Standing balance support: Single extremity supported, Bilateral upper extremity supported, Reliant on assistive device for balance, During functional activity Standing balance-Leahy Scale: Poor  Cognition Arousal: Alert Behavior During Therapy: WFL for tasks assessed/performed Overall Cognitive Status: Impaired/Different from baseline Area of Impairment: Awareness, Problem solving, Memory                     Memory: Decreased short-term memory     Awareness: Emergent Problem Solving: Slow processing, Difficulty sequencing, Requires verbal cues          Exercises      General Comments        Pertinent  Vitals/Pain Pain Assessment Pain Assessment: Faces Faces Pain Scale: Hurts little more Pain Location: Rt foot/ankle Pain Descriptors / Indicators: Aching, Guarding Pain Intervention(s): Limited activity within patient's tolerance, Monitored during session, Repositioned    Home Living                          Prior Function            PT Goals (current goals can now be found in the care plan section) Acute Rehab PT Goals Patient Stated Goal: to reduce pain in RLE PT Goal Formulation: With patient Time For Goal Achievement: 05/18/23 Potential to Achieve Goals: Fair Progress towards PT goals: Progressing toward goals    Frequency    Min 1X/week      PT Plan      Co-evaluation              AM-PAC PT "6 Clicks" Mobility   Outcome Measure  Help needed turning from your back to your side while in a flat bed without using bedrails?: A Little Help needed moving from lying on your back to sitting on the side of a flat bed without using bedrails?: A Little Help needed moving to and from a bed to a chair (including a wheelchair)?: A Little Help needed standing up from a chair using your arms (e.g., wheelchair or bedside chair)?: A Little Help needed to walk in hospital room?: A Little Help needed climbing 3-5 steps with a railing? : A Lot 6 Click Score: 17    End of Session Equipment Utilized During Treatment: Gait belt Activity Tolerance: Patient tolerated treatment well Patient left: in bed;with call bell/phone within reach;with bed alarm set Nurse Communication: Mobility status PT Visit Diagnosis: Other abnormalities of gait and mobility (R26.89);Muscle weakness (generalized) (M62.81);Pain Pain - Right/Left: Right Pain - part of body: Leg     Time: 1206-1228 PT Time Calculation (min) (ACUTE ONLY): 22 min  Charges:    $Gait Training: 8-22 mins PT General Charges $$ ACUTE PT VISIT: 1 Visit                     Wynn Maudlin, DPT Acute  Rehabilitation Services Office [phone]  05/06/23 3:47 PM

## 2023-05-06 NOTE — Progress Notes (Signed)
Heart Failure Navigator Progress Note  Assessed for Heart & Vascular TOC clinic readiness.  Patient EF 45-50%, patient declined HF TOC appointment, reported she has too many appointments coming up. Patient was educated on the sign and symptoms of heart failure, daily weights, Diet/ fluid restrictions, taking all medications as prescribed and attending all medical appointments. Patient reported she see's a Cardiologist in [facility] and has a upcoming appointment, but she currently can't remember the date, it at home on calendar. .   Navigator will sign off at this time.   Rhae Hammock, BSN, Scientist, clinical (histocompatibility and immunogenetics) Only

## 2023-05-06 NOTE — Progress Notes (Signed)
Mobility Specialist Progress Note:   05/06/23 1554  Mobility  Activity Ambulated with assistance in room  Level of Assistance Contact guard assist, steadying assist  Assistive Device Front wheel walker  Distance Ambulated (ft) 25 ft  Activity Response Tolerated well  Mobility Referral Yes  Mobility visit 1 Mobility  Mobility Specialist Start Time (ACUTE ONLY) 1500  Mobility Specialist Stop Time (ACUTE ONLY) 1525  Mobility Specialist Time Calculation (min) (ACUTE ONLY) 25 min   Pt received EOB, agreeable to mobility with encouragement. CG to stand and ambulate in room. VSS. Pt denied any discomfort during ambulation, asx throughout. Pt returned to EOB with call bell in reach and all needs met. Bed alarm on.  Leory Plowman  Mobility Specialist Please contact via SecureChat Rehab office at [phone]

## 2023-05-06 NOTE — Progress Notes (Signed)
Cardiology was curb sided with questions of regional wall motion abnormalities noted on echocardiogram in the absence of any concerning symptoms.  Discussed with Dr. Wyline Mood, we will plan to see patient outpatient for further evaluation.  She will be seen approximately 2 weeks from today.  Follow-up has been arranged and updated in AVS.

## 2023-05-06 NOTE — Progress Notes (Signed)
PROGRESS NOTE    Holly Guerra  ZOX:[id number] DOB: 12/28/1940 DOA: 05/03/2023 PCP: Coralee Rud, PA-C  Chief Complaint  Patient presents with   Weakness    Brief Narrative:   Holly Guerra was admitted to the hospital with the working diagnosis of acute heart failure in the setting of influenza [name] infection.    [name] with history of aortic insufficiency, peripheral vascular disease, GERD, essential hypertension, pernicious anemia presented to the ED yesterday with multiple symptoms, progressive shortness of breath X 1 week, some swelling, cough and orthopnea, in addition also complained of congestion sinus pain and headaches. -In the ED she was noted to be mildly hypoxic, BNP 4119, respiratory virus panel positive for influenza Holly Guerra, hemoglobin 9.2, creatinine 1.03,    Chest radiograph with cardiomegaly with bilateral hilar vascular congestion and cephalization of the vasculature.    EKG 89 bpm, left axis deviation, left anterior fascicular block, qtc 508, sinus rhythm with poor R R wave progression, no significant ST segment changes and negative T wave V5 and V6.   Assessment & Plan:   Principal Problem:   New onset of congestive heart failure (HCC) Active Problems:   Influenza [name] with pneumonia   Essential hypertension   AKI (acute kidney injury) (HCC)   Pernicious anemia   GERD (gastroesophageal reflux disease)   PVD (peripheral vascular disease) (HCC)   New onset of congestive heart failure (HCC) Echo with EF 45-50%, regional wall motion abnormalities, grade II diastolic dysfunction, moderate AS  Spironolactone, jardiance Will resume lasix 12/27  Influenza [name] with pneumonia No signs of bacterial superinfection.  oseltamivir for antiviral therapy.    Regional Wall Motion Abnormality Plan for outpatient cardiology follow up   AKI (acute kidney injury) (HCC) Creatinine has risen with diuresis, will monitor    Hypokalemia.  Resolved  Essential hypertension amlodipine and  nebevilol.    Pernicious anemia Cell count has been stable.  Patient on B12 injections q 30 days.    Iron deficiency anemia, continue with oral iron supplementation.    GERD (gastroesophageal reflux disease) Continue with pantoprazole.    PVD (peripheral vascular disease) (HCC) Continue blood pressure control  Patient on aspirin, and clopidogrel Hold cilostazol in the setting of heart failure.    DVT prophylaxis: heparin Code Status: full Family Communication: none Disposition:   Status is: Inpatient Remains inpatient appropriate because: awaiting safe discharge plan   Consultants:  none  Procedures:  Echo IMPRESSIONS     1. Papillary muscle hypertrophy. Left ventricular ejection fraction, by  estimation, is 45 to 50%. The left ventricle has mildly decreased  function. The left ventricle demonstrates regional wall motion  abnormalities (see scoring diagram/findings for  description). There is severe concentric left ventricular hypertrophy of  the inferior segment. Left ventricular diastolic parameters are consistent  with Grade II diastolic dysfunction (pseudonormalization). Basal function  is preserved.   2. Right ventricular systolic function is normal. The right ventricular  size is normal. Tricuspid regurgitation signal is inadequate for assessing  PA pressure.   3. Left atrial size was severely dilated.   4. Mitral valve leaflet thickening and doming without clear  calcifications. The mitral valve is abnormal. Mild mitral valve  regurgitation.   5. The aortic valve is calcified. Aortic valve regurgitation is mild to  moderate. Moderate aortic valve stenosis. Aortic regurgitation PHT  measures 306 msec. Aortic valve area, by VTI measures 1.02 cm. Aortic  valve Vmax measures 3.02 m/s.   6. There is mild dilatation of the  ascending aorta, measuring 39 mm. Mild  when indexed to age, gender, and BSA.   7. The inferior vena cava is normal in size with greater  than 50%  respiratory variability, suggesting right atrial pressure of 3 mmHg.   Comparison(s): Prior images unable to be directly viewed, comparison made  by report only.   Antimicrobials:  Anti-infectives (From admission, onward)    Start     Dose/Rate Route Frequency Ordered Stop   05/04/23 0100  oseltamivir (TAMIFLU) capsule 30 mg        30 mg Oral Daily 05/04/23 0004 05/09/23 0959       Subjective: No complaints  Objective: Vitals:   05/06/23 0400 05/06/23 0419 05/06/23 0500 05/06/23 1504  BP:  104/60  (!) 127/56  Pulse:  61  66  Resp:  17  16  Temp:  (!) 97.5 F (36.4 C)  97.6 F (36.4 C)  TempSrc:  Oral  Oral  SpO2: 98% 97%  100%  Weight:   38.6 kg   Height:        Intake/Output Summary (Last 24 hours) at 05/06/2023 1757 Last data filed at 05/06/2023 7829 Gross per 24 hour  Intake --  Output 600 ml  Net -600 ml   Filed Weights   05/04/23 0500 05/05/23 0501 05/06/23 0500  Weight: 40.8 kg 38.7 kg 38.6 kg    Examination:  General exam: Appears calm and comfortable  Respiratory system: unlabored  Cardiovascular system: RRR Central nervous system: Alert and oriented. No focal neurological deficits. Extremities: no LEE   Data Reviewed: I have personally reviewed following labs and imaging studies  CBC: Recent Labs  Lab 05/03/23 1557 05/04/23 0354  WBC 8.5 9.3  NEUTROABS 6.1  --   HGB 9.2* 9.4*  HCT 31.0* 31.3*  MCV 69.4* 70.2*  PLT 342 348    Basic Metabolic Panel: Recent Labs  Lab 05/03/23 1557 05/04/23 0354 05/05/23 0420 05/06/23 0409  NA 143 145 141 144  K 4.4 3.7 2.8* 3.8  CL 111 111 104 108  CO2 21* 22 23 23   GLUCOSE 113* 96 117* 103*  BUN 26* 22 23 24*  CREATININE 1.03* 1.29* 1.34* 1.31*  CALCIUM 9.3 9.1 8.8* 9.2  MG  --   --   --  2.8*    GFR: Estimated Creatinine Clearance: 20.5 mL/min (Nalla Purdy) (by C-G formula based on SCr of 1.31 mg/dL (H)).  Liver Function Tests: Recent Labs  Lab 05/03/23 1557 05/04/23 0354  AST 74*  68*  ALT 46* 47*  ALKPHOS 59 56  BILITOT 0.8 1.1  PROT 7.0 6.7  ALBUMIN 3.5 3.3*    CBG: No results for input(s): "GLUCAP" in the last 168 hours.   Recent Results (from the past 240 hours)  Resp panel by RT-PCR (RSV, Flu Ronna Herskowitz&B, Covid) Anterior Nasal Swab     Status: Abnormal   Collection Time: 05/03/23  3:57 PM   Specimen: Anterior Nasal Swab  Result Value Ref Range Status   SARS Coronavirus 2 by RT PCR NEGATIVE NEGATIVE Final    Comment: (NOTE) SARS-CoV-2 target nucleic acids are NOT DETECTED.  The SARS-CoV-2 RNA is generally detectable in upper respiratory specimens during the acute phase of infection. The lowest concentration of SARS-CoV-2 viral copies this assay can detect is 138 copies/mL. Floyd Wade negative result does not preclude SARS-Cov-2 infection and should not be used as the sole basis for treatment or other patient management decisions. Kylyn Sookram negative result may occur with  improper specimen collection/handling, submission of  specimen other than nasopharyngeal swab, presence of viral mutation(s) within the areas targeted by this assay, and inadequate number of viral copies(<138 copies/mL). Kaianna Dolezal negative result must be combined with clinical observations, patient history, and epidemiological information. The expected result is Negative.  Fact Sheet for Patients:  BloggerCourse.com  Fact Sheet for Healthcare Providers:  SeriousBroker.it  This test is no t yet approved or cleared by the [place] FDA and  has been authorized for detection and/or diagnosis of SARS-CoV-2 by FDA under an Emergency Use Authorization (EUA). This EUA will remain  in effect (meaning this test can be used) for the duration of the COVID-19 declaration under Section 564(b)(1) of the Act, 21 U.S.C.section 360bbb-3(b)(1), unless the authorization is terminated  or revoked sooner.       Influenza Kiersten Coss by PCR POSITIVE (Mishell Donalson) NEGATIVE Final   Influenza B by  PCR NEGATIVE NEGATIVE Final    Comment: (NOTE) The Xpert Xpress SARS-CoV-2/FLU/RSV plus assay is intended as an aid in the diagnosis of influenza from Nasopharyngeal swab specimens and should not be used as Elica Almas sole basis for treatment. Nasal washings and aspirates are unacceptable for Xpert Xpress SARS-CoV-2/FLU/RSV testing.  Fact Sheet for Patients: BloggerCourse.com  Fact Sheet for Healthcare Providers: SeriousBroker.it  This test is not yet approved or cleared by the [place] FDA and has been authorized for detection and/or diagnosis of SARS-CoV-2 by FDA under an Emergency Use Authorization (EUA). This EUA will remain in effect (meaning this test can be used) for the duration of the COVID-19 declaration under Section 564(b)(1) of the Act, 21 U.S.C. section 360bbb-3(b)(1), unless the authorization is terminated or revoked.     Resp Syncytial Virus by PCR NEGATIVE NEGATIVE Final    Comment: (NOTE) Fact Sheet for Patients: BloggerCourse.com  Fact Sheet for Healthcare Providers: SeriousBroker.it  This test is not yet approved or cleared by the [place] FDA and has been authorized for detection and/or diagnosis of SARS-CoV-2 by FDA under an Emergency Use Authorization (EUA). This EUA will remain in effect (meaning this test can be used) for the duration of the COVID-19 declaration under Section 564(b)(1) of the Act, 21 U.S.C. section 360bbb-3(b)(1), unless the authorization is terminated or revoked.  Performed at [facility], [address], [place], [place] [address]          Radiology Studies: No results found.      Scheduled Meds:  allopurinol  100 mg Oral QPM   amLODipine  10 mg Oral QPM   aspirin EC  81 mg Oral Daily   atorvastatin  80 mg Oral QPM   clopidogrel  75 mg Oral Daily   docusate sodium  100 mg Oral BID   empagliflozin  10 mg  Oral Daily   ferrous sulfate  325 mg Oral Daily   heparin  5,000 Units Subcutaneous Q8H   oseltamivir  30 mg Oral Daily   spironolactone  12.5 mg Oral Daily   Continuous Infusions:   LOS: 2 days    Time spent: over 30 min    Lacretia Nicks, MD [facility]   To contact the attending provider between 7A-7P or the covering provider during after hours 7P-7A, please log into the web site [email or web address] and access using universal [place] password for that web site. If you do not have the password, please call the hospital operator.  05/06/2023, 5:57 PM

## 2023-05-06 NOTE — Plan of Care (Signed)
  Problem: Education: Goal: Knowledge of General Education information will improve Description: Including pain rating scale, medication(s)/side effects and non-pharmacologic comfort measures Outcome: Progressing   Problem: Health Behavior/Discharge Planning: Goal: Ability to manage health-related needs will improve Outcome: Progressing   Problem: Clinical Measurements: Goal: Ability to maintain clinical measurements within normal limits will improve Outcome: Progressing Goal: Will remain free from infection Outcome: Progressing Goal: Diagnostic test results will improve Outcome: Progressing Goal: Respiratory complications will improve Outcome: Progressing Goal: Cardiovascular complication will be avoided Outcome: Progressing   Problem: Activity: Goal: Risk for activity intolerance will decrease Outcome: Progressing   Problem: Nutrition: Goal: Adequate nutrition will be maintained Outcome: Progressing   Problem: Coping: Goal: Level of anxiety will decrease Outcome: Progressing   Problem: Elimination: Goal: Will not experience complications related to bowel motility Outcome: Progressing Goal: Will not experience complications related to urinary retention Outcome: Progressing   Problem: Pain Management: Goal: General experience of comfort will improve Outcome: Progressing   Problem: Safety: Goal: Ability to remain free from injury will improve Outcome: Progressing   Problem: Skin Integrity: Goal: Risk for impaired skin integrity will decrease Outcome: Progressing   Problem: Education: Goal: Ability to demonstrate management of disease process will improve Outcome: Progressing Goal: Ability to verbalize understanding of medication therapies will improve Outcome: Progressing Goal: Individualized Educational Video(s) Outcome: Progressing   Problem: Activity: Goal: Capacity to carry out activities will improve Outcome: Progressing   Problem: Cardiac: Goal:  Ability to achieve and maintain adequate cardiopulmonary perfusion will improve Outcome: Progressing   Problem: Education: Goal: Ability to demonstrate management of disease process will improve Outcome: Progressing Goal: Ability to verbalize understanding of medication therapies will improve Outcome: Progressing Goal: Individualized Educational Video(s) Outcome: Progressing   Problem: Activity: Goal: Capacity to carry out activities will improve Outcome: Progressing   Problem: Cardiac: Goal: Ability to achieve and maintain adequate cardiopulmonary perfusion will improve Outcome: Progressing

## 2023-05-07 DIAGNOSIS — I509 Heart failure, unspecified: Secondary | ICD-10-CM | POA: Diagnosis not present

## 2023-05-07 LAB — MAGNESIUM: Magnesium: 2.3 mg/dL (ref 1.7–2.4)

## 2023-05-07 LAB — URINALYSIS, W/ REFLEX TO CULTURE (INFECTION SUSPECTED)
Bilirubin Urine: NEGATIVE
Glucose, UA: 50 mg/dL — AB
Ketones, ur: 5 mg/dL — AB
Nitrite: NEGATIVE
Protein, ur: NEGATIVE mg/dL
Specific Gravity, Urine: 1.013 (ref 1.005–1.030)
pH: 6 (ref 5.0–8.0)

## 2023-05-07 LAB — CBC WITH DIFFERENTIAL/PLATELET
Abs Immature Granulocytes: 0.08 10*3/uL — ABNORMAL HIGH (ref 0.00–0.07)
Basophils Absolute: 0 10*3/uL (ref 0.0–0.1)
Basophils Relative: 0 %
Eosinophils Absolute: 0 10*3/uL (ref 0.0–0.5)
Eosinophils Relative: 0 %
HCT: 34 % — ABNORMAL LOW (ref 36.0–46.0)
Hemoglobin: 10.5 g/dL — ABNORMAL LOW (ref 12.0–15.0)
Immature Granulocytes: 1 %
Lymphocytes Relative: 4 %
Lymphs Abs: 0.4 10*3/uL — ABNORMAL LOW (ref 0.7–4.0)
MCH: 21 pg — ABNORMAL LOW (ref 26.0–34.0)
MCHC: 30.9 g/dL (ref 30.0–36.0)
MCV: 68 fL — ABNORMAL LOW (ref 80.0–100.0)
Monocytes Absolute: 1.5 10*3/uL — ABNORMAL HIGH (ref 0.1–1.0)
Monocytes Relative: 13 %
Neutro Abs: 10 10*3/uL — ABNORMAL HIGH (ref 1.7–7.7)
Neutrophils Relative %: 82 %
Platelets: 316 10*3/uL (ref 150–400)
RBC: 5 MIL/uL (ref 3.87–5.11)
RDW: 20.9 % — ABNORMAL HIGH (ref 11.5–15.5)
WBC: 12.1 10*3/uL — ABNORMAL HIGH (ref 4.0–10.5)
nRBC: 1.8 % — ABNORMAL HIGH (ref 0.0–0.2)

## 2023-05-07 LAB — COMPREHENSIVE METABOLIC PANEL
ALT: 39 U/L (ref 0–44)
AST: 56 U/L — ABNORMAL HIGH (ref 15–41)
Albumin: 3.4 g/dL — ABNORMAL LOW (ref 3.5–5.0)
Alkaline Phosphatase: 56 U/L (ref 38–126)
Anion gap: 11 (ref 5–15)
BUN: 21 mg/dL (ref 8–23)
CO2: 25 mmol/L (ref 22–32)
Calcium: 9.5 mg/dL (ref 8.9–10.3)
Chloride: 107 mmol/L (ref 98–111)
Creatinine, Ser: 1.32 mg/dL — ABNORMAL HIGH (ref 0.44–1.00)
GFR, Estimated: 41 mL/min — ABNORMAL LOW (ref >60)
Glucose, Bld: 91 mg/dL (ref 70–99)
Potassium: 3.9 mmol/L (ref 3.5–5.1)
Sodium: 143 mmol/L (ref 135–145)
Total Bilirubin: 0.9 mg/dL (ref <1.2)
Total Protein: 7.5 g/dL (ref 6.5–8.1)

## 2023-05-07 LAB — PHOSPHORUS: Phosphorus: 3.1 mg/dL (ref 2.5–4.6)

## 2023-05-07 MED ORDER — THIAMINE MONONITRATE 100 MG PO TABS: 100.0000 mg | ORAL_TABLET | Freq: Every day | ORAL | Status: DC

## 2023-05-07 MED ORDER — THIAMINE HCL 100 MG/ML IJ SOLN
500.0000 mg | Freq: Three times a day (TID) | INTRAVENOUS | Status: AC
  Administered 2023-05-07 – 2023-05-10 (×8): 500 mg via INTRAVENOUS
  Filled 2023-05-07 (×9): qty 5

## 2023-05-07 MED ORDER — ADULT MULTIVITAMIN W/MINERALS CH
1.0000 | ORAL_TABLET | Freq: Every day | ORAL | Status: DC
  Administered 2023-05-07 – 2023-05-19 (×13): 1 via ORAL
  Filled 2023-05-07 (×13): qty 1

## 2023-05-07 MED ORDER — ENSURE ENLIVE PO LIQD
237.0000 mL | Freq: Three times a day (TID) | ORAL | Status: DC
  Administered 2023-05-07 – 2023-05-19 (×27): 237 mL via ORAL

## 2023-05-07 MED ORDER — THIAMINE HCL 100 MG/ML IJ SOLN
250.0000 mg | Freq: Every day | INTRAVENOUS | Status: DC
  Filled 2023-05-07: qty 2.5

## 2023-05-07 MED ORDER — ENSURE ENLIVE PO LIQD: 237.0000 mL | Freq: Two times a day (BID) | ORAL | Status: DC

## 2023-05-07 NOTE — Progress Notes (Addendum)
Initial Nutrition Assessment  DOCUMENTATION CODES:   Severe malnutrition in context of chronic illness  INTERVENTION:   - Continue regular diet and encourage PO intake with feeding assistance - Change diet order to with assist  - Ensure Enlive po TID, each supplement provides 350 kcal and 20 grams of protein. - MVI with minerals daily - 100 mg Thiamine daily - If within GOC, highly recommend initiation of nutrition support due to pt being severely malnourished   NUTRITION DIAGNOSIS:   Severe Malnutrition related to chronic illness as evidenced by severe muscle depletion, severe fat depletion.   GOAL:   Patient will meet greater than or equal to 90% of their needs   MONITOR:   PO intake, Weight trends, Supplement acceptance, Labs, I & O's  REASON FOR ASSESSMENT:   Consult Assessment of nutrition requirement/status  ASSESSMENT:  82 y.o Female with PMH of HTN, aortic valvular disease  pernicious anemia, GERD, arthritis, PVD, tobacco use, CAD. Presented with shortness of breath and weakness. Found to have acute heart failure in the setting of influenza A infection  Spoke to RN who states she is more confused compared to yesterday. She reports she was able to feed herself, remember her name, and place. Now she only remembers her name and requires feeding assistance.   On visit pt appears very frail, she is not able to remember how she was eating before. Per RN she is only eating bites of her food. Pt nodded yes to trying Ensures. Noted from EHR, pt is lactose intolerant.  No family present.   Per weight history, pt's weight has been trending down. In the last 3 months she has lost 14 lbs, 14% weight loss. PT being diuresed, creatinine rising, MD monitoring.   If within GOC, pt needs nutrition support as she is severely malnourished. MD to speak with family. Pt at refeeding risk.    Admit weight: 38.7 kg  Current weight: 38.9 kg    05/07/23 38.9 kg  11/23/22 45.6 kg   11/15/22 45.4 kg  10/22/21 49.4 kg  04/30/21 51.7 kg  06/14/20 54.4 kg  05/23/20 59 kg  04/16/20 56.2 kg  03/20/20 57.3 kg  09/24/19 61.7 kg   Average Meal Intake: 12/24: 50% intake x 1 recorded meals  Nutritionally Relevant Medications: Scheduled Meds:  allopurinol  100 mg Oral QPM   amLODipine  10 mg Oral QPM   aspirin EC  81 mg Oral Daily   atorvastatin  80 mg Oral QPM   clopidogrel  75 mg Oral Daily   docusate sodium  100 mg Oral BID   empagliflozin  10 mg Oral Daily   feeding supplement  237 mL Oral BID BM   ferrous sulfate  325 mg Oral Daily   furosemide  20 mg Oral Daily   heparin  5,000 Units Subcutaneous Q8H   multivitamin with minerals  1 tablet Oral Daily   oseltamivir  30 mg Oral Daily   spironolactone  12.5 mg Oral Daily   thiamine  100 mg Oral Daily   Labs Reviewed: Creatinine 1.32  NUTRITION - FOCUSED PHYSICAL EXAM:  Flowsheet Row Most Recent Value  Orbital Region Severe depletion  Upper Arm Region Severe depletion  Thoracic and Lumbar Region Severe depletion  Buccal Region Severe depletion  Temple Region Severe depletion  Clavicle Bone Region Severe depletion  Clavicle and Acromion Bone Region Severe depletion  Scapular Bone Region Severe depletion  Dorsal Hand Severe depletion  Patellar Region Severe depletion  Anterior Thigh Region  Severe depletion  Posterior Calf Region Severe depletion  Edema (RD Assessment) None  Hair Reviewed  Eyes Reviewed  Mouth Reviewed  Skin Reviewed  Nails Reviewed  [black toenails]       Diet Order:   Diet Order             Diet regular Room service appropriate? Yes with Assist; Fluid consistency: Thin  Diet effective now                   EDUCATION NEEDS:   Not appropriate for education at this time  Skin:  Skin Assessment: Reviewed RN Assessment  Last BM:  05/04/2023  Height:   Ht Readings from Last 1 Encounters:  05/03/23 5\' 2"  (1.575 m)    Weight:   Wt Readings from Last 1  Encounters:  05/07/23 38.9 kg    Ideal Body Weight:  50 kg  BMI:  Body mass index is 15.69 kg/m.  Estimated Nutritional Needs:   Kcal:  1400-1600 kcal  Protein:  70-90 gm  Fluid:  >1.4L or per MD  Elliot Dally, RD Registered Dietitian  See Amion for more information

## 2023-05-07 NOTE — TOC Initial Note (Signed)
Transition of Care [facility]) - Initial/Assessment Note    Patient Details  Name: Holly Guerra MRN: [id number] Date of Birth: 1940/08/12  Transition of Care [facility]) CM/SW Contact:    Erin Sons, LCSW Phone Number: 05/07/2023, 10:00 AM  Clinical Narrative:                  CSW called pt's daughter to discuss SNF rec. CSW explained services at SNF vs going home with [facility] Pt lives with daughter though daughter works full time during the day. Daughter believes that pt would do better in a home environment and feels she might be likely to refuse therapies if at a SNF. Daughter is going to have a family meeting to discuss decision and the assistance they can provide at home(family caregivers versus private duty). Pt has a walker at home; would like a 3in1 if going home. Daughter agreeable to SNF w/u to see what SNF options there are. She will notify CSW with decision after discussing with family.   Expected Discharge Plan:  (Home health vs SNF) Barriers to Discharge: SNF Pending bed offer   Expected Discharge Plan and Services       Living arrangements for the past 2 months: Single Family Home                                      Prior Living Arrangements/Services Living arrangements for the past 2 months: Single Family Home Lives with:: Adult Children Patient language and need for interpreter reviewed:: Yes        Need for Family Participation in Patient Care: Yes (Comment) Care giver support system in place?: Yes (comment)   Criminal Activity/Legal Involvement Pertinent to Current Situation/Hospitalization: No - Comment as needed  Activities of Daily Living   ADL Screening (condition at time of admission) Independently performs ADLs?: No Does the patient have a NEW difficulty with bathing/dressing/toileting/self-feeding that is expected to last >3 days?: Yes (Initiates electronic notice to provider for possible OT consult) Does the patient have a NEW difficulty with getting  in/out of bed, walking, or climbing stairs that is expected to last >3 days?: Yes (Initiates electronic notice to provider for possible PT consult) Does the patient have a NEW difficulty with communication that is expected to last >3 days?: No Is the patient deaf or have difficulty hearing?: No Does the patient have difficulty seeing, even when wearing glasses/contacts?: No Does the patient have difficulty concentrating, remembering, or making decisions?: No  Permission Sought/Granted                  Emotional Assessment       Orientation: : Oriented to Self, Oriented to Place Alcohol / Substance Use: Not Applicable Psych Involvement: No (comment)  Admission diagnosis:  Flu [J11.1] Acute heart failure, unspecified heart failure type (HCC) [I50.9] New onset of congestive heart failure (HCC) [I50.9] Patient Active Problem List   Diagnosis Date Noted   AKI (acute kidney injury) (HCC) 05/04/2023   Microcytic anemia 05/04/2023   GERD (gastroesophageal reflux disease) 05/04/2023   Pernicious anemia 05/04/2023   Hypertensive crisis 05/04/2023   Acute heart failure (HCC) 05/03/2023   Influenza A with pneumonia 05/03/2023   PVD (peripheral vascular disease) (HCC) 07/23/2012   Essential hypertension 07/23/2012   HLD (hyperlipidemia) 07/23/2012   History of tobacco abuse 07/23/2012   PCP:  Coralee Rud, PA-C Pharmacy:   [facility] [phone] -  [place], [place] - [address] AT [address]OSS [address] [place] [address] Phone: [phone] Fax: [phone]  [id number] - [place], [place] - [address] AT [facility] [address] [place] [address] Phone: [phone] Fax: [phone]     Social Drivers of Health (SDOH) Social History: SDOH Screenings   Food Insecurity: No Food Insecurity (05/04/2023)  Housing: Low Risk  (05/06/2023)  Transportation Needs: No Transportation Needs  (05/06/2023)  Utilities: Not At Risk (05/04/2023)  Alcohol Screen: Low Risk  (05/06/2023)  Financial Resource Strain: Low Risk  (05/06/2023)  Tobacco Use: High Risk (05/04/2023)   SDOH Interventions: Housing Interventions: Intervention Not Indicated Transportation Interventions: Intervention Not Indicated Alcohol Usage Interventions: Intervention Not Indicated (Score <7) Financial Strain Interventions: Intervention Not Indicated   Readmission Risk Interventions     No data to display

## 2023-05-07 NOTE — Progress Notes (Signed)
PROGRESS NOTE    Holly Guerra  ZOX:[id number] DOB: 1940-08-02 DOA: 05/03/2023 PCP: Coralee Rud, PA-C  Chief Complaint  Patient presents with   Weakness    Brief Narrative:   Holly Guerra was admitted to the hospital with the working diagnosis of acute heart failure in the setting of influenza Holly Guerra infection.    [name] with history of aortic insufficiency, peripheral vascular disease, GERD, essential hypertension, pernicious anemia presented to the ED yesterday with multiple symptoms, progressive shortness of breath X 1 week, some swelling, cough and orthopnea, in addition also complained of congestion sinus pain and headaches. -In the ED she was noted to be mildly hypoxic, BNP 4119, respiratory virus panel positive for influenza Holly Guerra, hemoglobin 9.2, creatinine 1.03,    Chest radiograph with cardiomegaly with bilateral hilar vascular congestion and cephalization of the vasculature.    EKG 89 bpm, left axis deviation, left anterior fascicular block, qtc 508, sinus rhythm with poor R R wave progression, no significant ST segment changes and negative T wave V5 and V6.   Assessment & Plan:   Principal Problem:   Acute heart failure (HCC) Active Problems:   Influenza Holly Guerra with pneumonia   Essential hypertension   AKI (acute kidney injury) (HCC)   Pernicious anemia   GERD (gastroesophageal reflux disease)   PVD (peripheral vascular disease) (HCC)  Goals of Care Daughter brought up that family was considering her going home.  I think it'll be important to have additional goals of care conversations before this happens.  Daughter Holly Guerra) open to this conversation.  Brought up DNR, but she notes they've had issues discussing this with the patient in the past.  Will ask palliative care to assist with discussion.  She's had significant weight loss and poor PO intake recently.  Workup as noted below.  Acute Metabolic Encephalopathy Dysarthria  Per discussion with daughter, waxing/waning encephalopathy,  dysarthria for the past week Likely due to influenza infection  Will check head CT, consider MRI if unrevealing High dose thiamine  Delirium precautions  Weight Loss  Poor PO Intake  Weight loss over past few months RD recommending cortrak, Kim didn't think this would be Holly Guerra good idea (concern regarding possibility of her mother pulling cortrak).  With her decline I think reasonable to avoid cortrak as we work up the causes of her decline. CT abdomen/pelvis with contrast Of note CT 10/2021 had heterogenous medial splenic mass and possible rectal wall thickening   New onset of congestive heart failure (HCC) Echo with EF 45-50%, regional wall motion abnormalities, grade II diastolic dysfunction, moderate AS  Spironolactone, jardiance Lasix   Influenza Holly Guerra with pneumonia No signs of bacterial superinfection.  oseltamivir for antiviral therapy.    Regional Wall Motion Abnormality Plan for outpatient cardiology follow up   AKI (acute kidney injury) (HCC) Creatinine has risen with diuresis, will monitor    Hypokalemia.  Resolved  Essential hypertension amlodipine and nebevilol.    Pernicious anemia Cell count has been stable.  Patient on B12 injections q 30 days.    Iron deficiency anemia, continue with oral iron supplementation.    GERD (gastroesophageal reflux disease) Continue with pantoprazole.    PVD (peripheral vascular disease) (HCC) Continue blood pressure control  Patient on aspirin, and clopidogrel Hold cilostazol in the setting of heart failure.    DVT prophylaxis: heparin Code Status: full Family Communication: none Disposition:   Status is: Inpatient Remains inpatient appropriate because: awaiting safe discharge plan   Consultants:  none  Procedures:  Echo IMPRESSIONS     1. Papillary muscle hypertrophy. Left ventricular ejection fraction, by  estimation, is 45 to 50%. The left ventricle has mildly decreased  function. The left ventricle  demonstrates regional wall motion  abnormalities (see scoring diagram/findings for  description). There is severe concentric left ventricular hypertrophy of  the inferior segment. Left ventricular diastolic parameters are consistent  with Grade II diastolic dysfunction (pseudonormalization). Basal function  is preserved.   2. Right ventricular systolic function is normal. The right ventricular  size is normal. Tricuspid regurgitation signal is inadequate for assessing  PA pressure.   3. Left atrial size was severely dilated.   4. Mitral valve leaflet thickening and doming without clear  calcifications. The mitral valve is abnormal. Mild mitral valve  regurgitation.   5. The aortic valve is calcified. Aortic valve regurgitation is mild to  moderate. Moderate aortic valve stenosis. Aortic regurgitation PHT  measures 306 msec. Aortic valve area, by VTI measures 1.02 cm. Aortic  valve Vmax measures 3.02 m/s.   6. There is mild dilatation of the ascending aorta, measuring 39 mm. Mild  when indexed to age, gender, and BSA.   7. The inferior vena cava is normal in size with greater than 50%  respiratory variability, suggesting right atrial pressure of 3 mmHg.   Comparison(s): Prior images unable to be directly viewed, comparison made  by report only.   Antimicrobials:  Anti-infectives (From admission, onward)    Start     Dose/Rate Route Frequency Ordered Stop   05/04/23 0100  oseltamivir (TAMIFLU) capsule 30 mg        30 mg Oral Daily 05/04/23 0004 05/09/23 0959       Subjective: No complaints  Objective: Vitals:   05/07/23 0000 05/07/23 0400 05/07/23 0547 05/07/23 1349  BP:   134/62 (!) 156/73  Pulse:   70   Resp:   16 14  Temp:   (!) 97.3 F (36.3 C)   TempSrc:   Oral Axillary  SpO2: 98% 97% 97%   Weight:   38.9 kg   Height:       No intake or output data in the 24 hours ending 05/07/23 1529  Filed Weights   05/05/23 0501 05/06/23 0500 05/07/23 0547  Weight: 38.7  kg 38.6 kg 38.9 kg    Examination:  General: No acute distress. Cardiovascular: RRR Lungs: coarse cough  Neurological: alert, dysarthric speech, 5/5 strength to upper extremities, bilateral lower extremities 4/5 strength.  Inconsistent FNF, but did well with practice.  Heel to shin was slow and incomplete bilaterally.  Skin: Warm and dry. No rashes or lesions. Extremities: No clubbing or cyanosis. No edema.   Data Reviewed: I have personally reviewed following labs and imaging studies  CBC: Recent Labs  Lab 05/03/23 1557 05/04/23 0354 05/07/23 0506  WBC 8.5 9.3 12.1*  NEUTROABS 6.1  --  10.0*  HGB 9.2* 9.4* 10.5*  HCT 31.0* 31.3* 34.0*  MCV 69.4* 70.2* 68.0*  PLT 342 348 316    Basic Metabolic Panel: Recent Labs  Lab 05/03/23 1557 05/04/23 0354 05/05/23 0420 05/06/23 0409 05/07/23 0506  NA 143 145 141 144 143  K 4.4 3.7 2.8* 3.8 3.9  CL 111 111 104 108 107  CO2 21* 22 23 23 25   GLUCOSE 113* 96 117* 103* 91  BUN 26* 22 23 24* 21  CREATININE 1.03* 1.29* 1.34* 1.31* 1.32*  CALCIUM 9.3 9.1 8.8* 9.2 9.5  MG  --   --   --  2.8* 2.3  PHOS  --   --   --   --  3.1    GFR: Estimated Creatinine Clearance: 20.5 mL/min (Lynn Sissel) (by C-G formula based on SCr of 1.32 mg/dL (H)).  Liver Function Tests: Recent Labs  Lab 05/03/23 1557 05/04/23 0354 05/07/23 0506  AST 74* 68* 56*  ALT 46* 47* 39  ALKPHOS 59 56 56  BILITOT 0.8 1.1 0.9  PROT 7.0 6.7 7.5  ALBUMIN 3.5 3.3* 3.4*    CBG: No results for input(s): "GLUCAP" in the last 168 hours.   Recent Results (from the past 240 hours)  Resp panel by RT-PCR (RSV, Flu Taran Hable&B, Covid) Anterior Nasal Swab     Status: Abnormal   Collection Time: 05/03/23  3:57 PM   Specimen: Anterior Nasal Swab  Result Value Ref Range Status   SARS Coronavirus 2 by RT PCR NEGATIVE NEGATIVE Final    Comment: (NOTE) SARS-CoV-2 target nucleic acids are NOT DETECTED.  The SARS-CoV-2 RNA is generally detectable in upper respiratory specimens during  the acute phase of infection. The lowest concentration of SARS-CoV-2 viral copies this assay can detect is 138 copies/mL. Tawfiq Favila negative result does not preclude SARS-Cov-2 infection and should not be used as the sole basis for treatment or other patient management decisions. Anthonie Lotito negative result may occur with  improper specimen collection/handling, submission of specimen other than nasopharyngeal swab, presence of viral mutation(s) within the areas targeted by this assay, and inadequate number of viral copies(<138 copies/mL). Yarisa Lynam negative result must be combined with clinical observations, patient history, and epidemiological information. The expected result is Negative.  Fact Sheet for Patients:  BloggerCourse.com  Fact Sheet for Healthcare Providers:  SeriousBroker.it  This test is no t yet approved or cleared by the [place] FDA and  has been authorized for detection and/or diagnosis of SARS-CoV-2 by FDA under an Emergency Use Authorization (EUA). This EUA will remain  in effect (meaning this test can be used) for the duration of the COVID-19 declaration under Section 564(b)(1) of the Act, 21 U.S.C.section 360bbb-3(b)(1), unless the authorization is terminated  or revoked sooner.       Influenza Kruti Horacek by PCR POSITIVE ([place]) NEGATIVE Final   Influenza B by PCR NEGATIVE NEGATIVE Final    Comment: (NOTE) The Xpert Xpress SARS-CoV-2/FLU/RSV plus assay is intended as an aid in the diagnosis of influenza from Nasopharyngeal swab specimens and should not be used as Natarsha Hurwitz sole basis for treatment. Nasal washings and aspirates are unacceptable for Xpert Xpress SARS-CoV-2/FLU/RSV testing.  Fact Sheet for Patients: BloggerCourse.com  Fact Sheet for Healthcare Providers: SeriousBroker.it  This test is not yet approved or cleared by the [place] FDA and has been authorized for detection and/or  diagnosis of SARS-CoV-2 by FDA under an Emergency Use Authorization (EUA). This EUA will remain in effect (meaning this test can be used) for the duration of the COVID-19 declaration under Section 564(b)(1) of the Act, 21 U.S.C. section 360bbb-3(b)(1), unless the authorization is terminated or revoked.     Resp Syncytial Virus by PCR NEGATIVE NEGATIVE Final    Comment: (NOTE) Fact Sheet for Patients: BloggerCourse.com  Fact Sheet for Healthcare Providers: SeriousBroker.it  This test is not yet approved or cleared by the [place] FDA and has been authorized for detection and/or diagnosis of SARS-CoV-2 by FDA under an Emergency Use Authorization (EUA). This EUA will remain in effect (meaning this test can be used) for the duration of the COVID-19 declaration under Section 564(b)(1) of the Act,  21 U.S.C. section 360bbb-3(b)(1), unless the authorization is terminated or revoked.  Performed at [facility], [address], [place], [place] [address]          Radiology Studies: No results found.      Scheduled Meds:  allopurinol  100 mg Oral QPM   amLODipine  10 mg Oral QPM   aspirin EC  81 mg Oral Daily   atorvastatin  80 mg Oral QPM   clopidogrel  75 mg Oral Daily   docusate sodium  100 mg Oral BID   empagliflozin  10 mg Oral Daily   feeding supplement  237 mL Oral TID BM   ferrous sulfate  325 mg Oral Daily   furosemide  20 mg Oral Daily   heparin  5,000 Units Subcutaneous Q8H   multivitamin with minerals  1 tablet Oral Daily   oseltamivir  30 mg Oral Daily   spironolactone  12.5 mg Oral Daily   thiamine  100 mg Oral Daily   [START ON 05/15/2023] thiamine  100 mg Oral Daily   Continuous Infusions:  thiamine (VITAMIN B1) injection     Followed by   Melene Muller ON 05/10/2023] thiamine (VITAMIN B1) injection       LOS: 3 days    Time spent: over 30 min    Lacretia Nicks, MD [facility]   To contact the attending provider between 7A-7P or the covering provider during after hours 7P-7A, please log into the web site [email or web address] and access using universal [place] password for that web site. If you do not have the password, please call the hospital operator.  05/07/2023, 3:29 PM

## 2023-05-07 NOTE — NC FL2 (Signed)
[place] MEDICAID FL2 LEVEL OF CARE FORM     IDENTIFICATION  Patient Name: Holly Guerra Birthdate: March 06, 1941 Sex: female Admission Date (Current Location): 05/03/2023  [facility] and [place] Number:  Producer, television/film/video and Address:  [facility] [place], [address] [place], [place] [address]      Provider Number: [id number]  Attending Physician Name and Address:  Zigmund Daniel., *  Relative Name and Phone Number:  Elliana, [place] (Daughter)  [phone] [facility])    Current Level of Care: Hospital Recommended Level of Care: Skilled Nursing Facility Prior Approval Number:    Date Approved/Denied:   PASRR Number: [id number]  Discharge Plan:      Current Diagnoses: Patient Active Problem List   Diagnosis Date Noted   AKI (acute kidney injury) (HCC) 05/04/2023   Microcytic anemia 05/04/2023   GERD (gastroesophageal reflux disease) 05/04/2023   Pernicious anemia 05/04/2023   Hypertensive crisis 05/04/2023   Acute heart failure (HCC) 05/03/2023   Influenza A with pneumonia 05/03/2023   PVD (peripheral vascular disease) (HCC) 07/23/2012   Essential hypertension 07/23/2012   HLD (hyperlipidemia) 07/23/2012   History of tobacco abuse 07/23/2012    Orientation RESPIRATION BLADDER Height & Weight     Self, Place  Normal Incontinent, External catheter Weight: 85 lb 12.1 oz (38.9 kg) Height:  5\' 2"  (157.5 cm)  BEHAVIORAL SYMPTOMS/MOOD NEUROLOGICAL BOWEL NUTRITION STATUS      Continent Diet (see d/c summary)  AMBULATORY STATUS COMMUNICATION OF NEEDS Skin   Extensive Assist Verbally Normal                       Personal Care Assistance Level of Assistance  Bathing, Feeding, Dressing Bathing Assistance: Maximum assistance Feeding assistance: Independent Dressing Assistance: Limited assistance     Functional Limitations Info  Sight, Hearing, Speech Sight Info: Impaired Hearing Info: Adequate Speech Info: Adequate    SPECIAL CARE  FACTORS FREQUENCY  PT (By licensed PT), OT (By licensed OT)     PT Frequency: 5x/week OT Frequency: 5x/week            Contractures Contractures Info: Not present    Additional Factors Info  Code Status, Allergies Code Status Info: Full code Allergies Info: Lactose Intolerance (gi)           Current Medications (05/07/2023):  This is the current hospital active medication list Current Facility-Administered Medications  Medication Dose Route Frequency Provider Last Rate Last Admin   acetaminophen (TYLENOL) tablet 650 mg  650 mg Oral Q6H PRN Janalyn Shy, Subrina, MD   650 mg at 05/04/23 1719   Or   acetaminophen (TYLENOL) suppository 650 mg  650 mg Rectal Q6H PRN Janalyn Shy, Subrina, MD       albuterol (PROVENTIL) (2.5 MG/3ML) 0.083% nebulizer solution 2.5 mg  2.5 mg Inhalation Q2H PRN Janalyn Shy, Subrina, MD       allopurinol (ZYLOPRIM) tablet 100 mg  100 mg Oral QPM Sundil, Subrina, MD   100 mg at 05/06/23 1723   amLODipine (NORVASC) tablet 10 mg  10 mg Oral QPM Sundil, Subrina, MD   10 mg at 05/06/23 1723   aspirin EC tablet 81 mg  81 mg Oral Daily Sundil, Subrina, MD   81 mg at 05/06/23 0911   atorvastatin (LIPITOR) tablet 80 mg  80 mg Oral QPM Sundil, Subrina, MD   80 mg at 05/06/23 1723   clopidogrel (PLAVIX) tablet 75 mg  75 mg Oral Daily Tereasa Coop, MD  75 mg at 05/06/23 0911   cyclobenzaprine (FLEXERIL) tablet 10 mg  10 mg Oral QHS PRN Janalyn Shy, Subrina, MD   10 mg at 05/06/23 1959   docusate sodium (COLACE) capsule 100 mg  100 mg Oral BID Janalyn Shy, Subrina, MD   100 mg at 05/06/23 2114   empagliflozin (JARDIANCE) tablet 10 mg  10 mg Oral Daily Zannie Cove, MD   10 mg at 05/06/23 1610   ferrous sulfate tablet 325 mg  325 mg Oral Daily Sundil, Subrina, MD   325 mg at 05/06/23 0911   furosemide (LASIX) tablet 20 mg  20 mg Oral Daily Zigmund Daniel., MD       guaiFENesin-dextromethorphan [facility] DM) 100-10 MG/5ML syrup 5 mL  5 mL Oral Q4H PRN Arrien, York Ram, MD        heparin injection 5,000 Units  5,000 Units Subcutaneous Q8H Janalyn Shy, Subrina, MD   5,000 Units at 05/07/23 0524   ondansetron [facility]) tablet 4 mg  4 mg Oral Q6H PRN Janalyn Shy, Subrina, MD       Or   ondansetron [facility]) injection 4 mg  4 mg Intravenous Q6H PRN Janalyn Shy, Subrina, MD       Oral care mouth rinse  15 mL Mouth Rinse PRN Janalyn Shy, Subrina, MD       oseltamivir (TAMIFLU) capsule 30 mg  30 mg Oral Daily Sundil, Subrina, MD   30 mg at 05/06/23 0911   pantoprazole (PROTONIX) EC tablet 40 mg  40 mg Oral Daily PRN Sundil, Subrina, MD       phenol (CHLORASEPTIC) mouth spray 1 spray  1 spray Mouth/Throat PRN Zigmund Daniel., MD   1 spray at 05/06/23 1959   spironolactone (ALDACTONE) tablet 12.5 mg  12.5 mg Oral Daily Arrien, York Ram, MD   12.5 mg at 05/06/23 0911   sucralfate (CARAFATE) tablet 1 g  1 g Oral QID PRN Janalyn Shy Subrina, MD       traMADol Janean Sark) tablet 50 mg  50 mg Oral Q12H PRN Janalyn Shy, Subrina, MD   50 mg at 05/06/23 0227     Discharge Medications: Please see discharge summary for a list of discharge medications.  Relevant Imaging Results:  Relevant Lab Results:   Additional Information SSN [id number] [place], [place]

## 2023-05-07 NOTE — Progress Notes (Signed)
Mobility Specialist Progress Note:    05/07/23 1500  Mobility  Activity Ambulated with assistance in room  Level of Assistance Minimal assist, patient does 75% or more  Assistive Device Front wheel walker  Distance Ambulated (ft) 30 ft (15+15)  Activity Response Tolerated well  Mobility Referral Yes  Mobility visit 1 Mobility  Mobility Specialist Start Time (ACUTE ONLY) 1441  Mobility Specialist Stop Time (ACUTE ONLY) 1459  Mobility Specialist Time Calculation (min) (ACUTE ONLY) 18 min   Pt received in bed, agreeable to mobility. Required minA to sit EOB and ambulate to door. Took x1 seated rest break. No complaints throughout. Pt left in bed with call bell and MD present. Bed alarm on.  [place]ante Earlene Plater Mobility Specialist Please contact via Special educational needs teacher or Rehab office at [phone]

## 2023-05-08 ENCOUNTER — Inpatient Hospital Stay (HOSPITAL_COMMUNITY): Payer: Medicare Other

## 2023-05-08 DIAGNOSIS — I509 Heart failure, unspecified: Secondary | ICD-10-CM | POA: Diagnosis not present

## 2023-05-08 LAB — CBC WITH DIFFERENTIAL/PLATELET
Abs Immature Granulocytes: 0 10*3/uL (ref 0.00–0.07)
Basophils Absolute: 0 10*3/uL (ref 0.0–0.1)
Basophils Relative: 0 %
Eosinophils Absolute: 0 10*3/uL (ref 0.0–0.5)
Eosinophils Relative: 0 %
HCT: 30.9 % — ABNORMAL LOW (ref 36.0–46.0)
Hemoglobin: 9.5 g/dL — ABNORMAL LOW (ref 12.0–15.0)
Lymphocytes Relative: 9 %
Lymphs Abs: 0.9 10*3/uL (ref 0.7–4.0)
MCH: 20.7 pg — ABNORMAL LOW (ref 26.0–34.0)
MCHC: 30.7 g/dL (ref 30.0–36.0)
MCV: 67.2 fL — ABNORMAL LOW (ref 80.0–100.0)
Monocytes Absolute: 0.4 10*3/uL (ref 0.1–1.0)
Monocytes Relative: 4 %
Neutro Abs: 9 10*3/uL — ABNORMAL HIGH (ref 1.7–7.7)
Neutrophils Relative %: 87 %
Platelets: 333 10*3/uL (ref 150–400)
RBC: 4.6 MIL/uL (ref 3.87–5.11)
RDW: 20.1 % — ABNORMAL HIGH (ref 11.5–15.5)
WBC: 10.3 10*3/uL (ref 4.0–10.5)
nRBC: 0.6 % — ABNORMAL HIGH (ref 0.0–0.2)
nRBC: 3 /100{WBCs} — ABNORMAL HIGH

## 2023-05-08 LAB — MAGNESIUM: Magnesium: 2 mg/dL (ref 1.7–2.4)

## 2023-05-08 LAB — COMPREHENSIVE METABOLIC PANEL
ALT: 33 U/L (ref 0–44)
AST: 43 U/L — ABNORMAL HIGH (ref 15–41)
Albumin: 2.9 g/dL — ABNORMAL LOW (ref 3.5–5.0)
Alkaline Phosphatase: 56 U/L (ref 38–126)
Anion gap: 12 (ref 5–15)
BUN: 16 mg/dL (ref 8–23)
CO2: 26 mmol/L (ref 22–32)
Calcium: 9.3 mg/dL (ref 8.9–10.3)
Chloride: 108 mmol/L (ref 98–111)
Creatinine, Ser: 1.12 mg/dL — ABNORMAL HIGH (ref 0.44–1.00)
GFR, Estimated: 49 mL/min — ABNORMAL LOW (ref >60)
Glucose, Bld: 79 mg/dL (ref 70–99)
Potassium: 3.3 mmol/L — ABNORMAL LOW (ref 3.5–5.1)
Sodium: 146 mmol/L — ABNORMAL HIGH (ref 135–145)
Total Bilirubin: 1.1 mg/dL (ref <1.2)
Total Protein: 7 g/dL (ref 6.5–8.1)

## 2023-05-08 LAB — HEPARIN LEVEL (UNFRACTIONATED): Heparin Unfractionated: 0.21 [IU]/mL — ABNORMAL LOW (ref 0.30–0.70)

## 2023-05-08 LAB — PHOSPHORUS: Phosphorus: 2.8 mg/dL (ref 2.5–4.6)

## 2023-05-08 LAB — OCCULT BLOOD X 1 CARD TO LAB, STOOL: Fecal Occult Bld: NEGATIVE

## 2023-05-08 MED ORDER — POTASSIUM CHLORIDE CRYS ER 20 MEQ PO TBCR
40.0000 meq | EXTENDED_RELEASE_TABLET | Freq: Once | ORAL | Status: AC
  Administered 2023-05-08: 40 meq via ORAL
  Filled 2023-05-08: qty 2

## 2023-05-08 MED ORDER — DILTIAZEM HCL 25 MG/5ML IV SOLN
5.0000 mg | Freq: Once | INTRAVENOUS | Status: DC
Start: 2023-05-08 — End: 2023-05-08
  Filled 2023-05-08: qty 5

## 2023-05-08 MED ORDER — HEPARIN (PORCINE) 25000 UT/250ML-% IV SOLN
750.0000 [IU]/h | INTRAVENOUS | Status: AC
  Administered 2023-05-08: 550 [IU]/h via INTRAVENOUS
  Administered 2023-05-10 – 2023-05-14 (×5): 750 [IU]/h via INTRAVENOUS
  Filled 2023-05-08 (×5): qty 250

## 2023-05-08 MED ORDER — CYCLOSPORINE 0.05 % OP EMUL
1.0000 [drp] | Freq: Two times a day (BID) | OPHTHALMIC | Status: DC
  Administered 2023-05-08 – 2023-05-19 (×20): 1 [drp] via OPHTHALMIC
  Filled 2023-05-08 (×23): qty 30

## 2023-05-08 MED ORDER — DILTIAZEM HCL 25 MG/5ML IV SOLN
5.0000 mg | Freq: Once | INTRAVENOUS | Status: DC
  Filled 2023-05-08: qty 5

## 2023-05-08 MED ORDER — METOPROLOL TARTRATE 12.5 MG HALF TABLET
12.5000 mg | ORAL_TABLET | Freq: Two times a day (BID) | ORAL | Status: DC
  Administered 2023-05-08 – 2023-05-19 (×20): 12.5 mg via ORAL
  Filled 2023-05-08 (×22): qty 1

## 2023-05-08 MED ORDER — DEXTROSE 5 % IV SOLN: INTRAVENOUS | Status: AC

## 2023-05-08 MED ORDER — DILTIAZEM HCL-DEXTROSE 125-5 MG/125ML-% IV SOLN (PREMIX): 5.0000 mg/h | INTRAVENOUS | Status: DC

## 2023-05-08 NOTE — Progress Notes (Signed)
PHARMACY - ANTICOAGULATION CONSULT NOTE  Pharmacy Consult for heparin Indication: atrial fibrillation  Allergies  Allergen Reactions   Lactose Intolerance (Gi) Diarrhea    Patient Measurements: Height: 5\' 2"  (157.5 cm) Weight: 38.7 kg (85 lb 4.8 oz) IBW/kg (Calculated) : 50.1 Heparin Dosing Weight: TBW  Vital Signs: Temp: 98.5 F (36.9 C) (12/28 0506) Temp Source: Oral (12/28 0506) BP: 133/62 (12/28 0506) Pulse Rate: 71 (12/28 0506)  Labs: Recent Labs    05/06/23 0409 05/07/23 0506 05/08/23 0326  HGB  --  10.5* 9.5*  HCT  --  34.0* 30.9*  PLT  --  316 333  CREATININE 1.31* 1.32* 1.12*    Estimated Creatinine Clearance: 24.1 mL/min (A) (by C-G formula based on SCr of 1.12 mg/dL (H)).   Medical History: Past Medical History:  Diagnosis Date   Abdominal bruit    Abnormal echocardiogram    Anemia    Aortic insufficiency    Aortic root dilation (HCC)    Aortic valve disorder    Arthritis    "all over" (07/22/2012)   Arthropathy    Bilateral carotid artery stenosis    Carotid artery disease (HCC)    Chest discomfort    Edema    Exertional shortness of breath    Fatigue    GERD (gastroesophageal reflux disease)    Gout    Hypercholesteremia    Hypertension    Mitral regurgitation and aortic stenosis    Mixed hyperlipidemia    Murmur, cardiac    Peripheral vascular disease (HCC)    Pernicious anemia    PVD (peripheral vascular disease) (HCC)    Shortness of breath    Sickle-cell trait (HCC)    Thoracic aortic aneurysm (HCC)    Tobacco use    URI (upper respiratory infection)     Medications:  Scheduled:   allopurinol  100 mg Oral QPM   aspirin EC  81 mg Oral Daily   atorvastatin  80 mg Oral QPM   clopidogrel  75 mg Oral Daily   diltiazem  5 mg Intravenous Once   docusate sodium  100 mg Oral BID   empagliflozin  10 mg Oral Daily   feeding supplement  237 mL Oral TID BM   ferrous sulfate  325 mg Oral Daily   furosemide  20 mg Oral Daily    multivitamin with minerals  1 tablet Oral Daily   oseltamivir  30 mg Oral Daily   potassium chloride  40 mEq Oral Once   spironolactone  12.5 mg Oral Daily   Infusions:   diltiazem (CARDIZEM) infusion     thiamine (VITAMIN B1) injection 500 mg (05/08/23 0550)   Followed by   Melene Muller ON 05/10/2023] thiamine (VITAMIN B1) injection     PRN: acetaminophen **OR** acetaminophen, albuterol, cyclobenzaprine, guaiFENesin-dextromethorphan, ondansetron **OR** ondansetron (ZOFRAN) IV, mouth rinse, pantoprazole, phenol, sucralfate, traMADol  Assessment: 17 YOF with new onset atrial fibrillation and new onset CHF. CHADsVASc score of 5 (age, sex, HTN, HF). Pharmacy consulted for heparin initiation. Patient started on heparin 5000 units subcutaneously every 8 hours this admission for VTE prophylaxis, last dose given at 0553 on 12/28.   Patient not on any anticoagulation prior to arrival. Noted patient is on aspirin and clopidogrel, for peripheral vascular disease. Patient also has a history of epistaxis requiring visits to the ED. CBC stable this admission (Hgb 9.5, PLT 333).   Goal of Therapy:  Heparin level 0.3-0.7 units/ml Monitor platelets by anticoagulation protocol: Yes   Plan:  Start  heparin infusion at 550 units/hr - no bolus due to recent dose of subcutaneous heparin Check anti-Xa level in 8 hours and daily while on heparin Monitor CBC and signs of bleeding daily F/u long-term anticoagulation plant  Enos Fling, PharmD PGY-1 Acute Care Pharmacy Resident 05/08/2023 7:52 AM

## 2023-05-08 NOTE — Progress Notes (Addendum)
PROGRESS NOTE    Holly Guerra  ZOX:[id number] DOB: 12-20-1940 DOA: 05/03/2023 PCP: Coralee Rud, PA-C  Chief Complaint  Patient presents with   Weakness    Brief Narrative:   Holly Guerra was admitted to the hospital with the working diagnosis of acute heart failure in the setting of influenza Holly Guerra infection.    [name] with history of aortic insufficiency, peripheral vascular disease, GERD, essential hypertension, pernicious anemia presented to the ED yesterday with multiple symptoms, progressive shortness of breath X 1 week, some swelling, cough and orthopnea, in addition also complained of congestion sinus pain and headaches. -In the ED she was noted to be mildly hypoxic, BNP 4119, respiratory virus panel positive for influenza Holly Guerra, hemoglobin 9.2, creatinine 1.03,    Chest radiograph with cardiomegaly with bilateral hilar vascular congestion and cephalization of the vasculature.    EKG 89 bpm, left axis deviation, left anterior fascicular block, qtc 508, sinus rhythm with poor R R wave progression, no significant ST segment changes and negative T wave V5 and V6.   Assessment & Plan:   Principal Problem:   Acute heart failure (HCC) Active Problems:   Influenza Holly Guerra with pneumonia   Essential hypertension   AKI (acute kidney injury) (HCC)   Pernicious anemia   GERD (gastroesophageal reflux disease)   PVD (peripheral vascular disease) (HCC)  Goals of Care Daughter brought up that family was considering her going home.  I think it'll be important to have additional goals of care conversations before this happens.  Daughter Holly Guerra) open to this conversation.  Brought up DNR, but she notes they've had issues discussing this with the patient in the past.  Will ask palliative care to assist with discussion.  She's had significant weight loss and poor PO intake recently.  Workup as noted below.  Atrial Fibrillation with RVR Back in sinus, will start metoprolol  Continue heparin gtt for  now  Acute Metabolic Encephalopathy Dysarthria  Per discussion with daughter, waxing/waning encephalopathy, dysarthria for the past week Likely due to influenza infection  Will check head CT -> progressive atrophy and white matter disease, advanced for age High dose thiamine  Delirium precautions  Weight Loss  Poor PO Intake  Weight loss over past few months RD recommending cortrak, Kim didn't think this would be Holly Guerra good idea (concern regarding possibility of her mother pulling cortrak).  With her decline I think reasonable to avoid cortrak as we work up the causes of her decline. CT abdomen/pelvis without acute abnormality (limited eval due to motion and streak artifact) Of note CT 10/2021 had heterogenous medial splenic mass and possible rectal wall thickening  Indeterminate Splenic Lesion Consider postcontrast CT vs MR   New onset of congestive heart failure (HCC) Echo with EF 45-50%, regional wall motion abnormalities, grade II diastolic dysfunction, moderate AS  Spironolactone, jardiance Lasix   Influenza Holly Guerra with pneumonia No signs of bacterial superinfection.  oseltamivir for antiviral therapy.    Regional Wall Motion Abnormality Plan for outpatient cardiology follow up   AKI (acute kidney injury) (HCC) Creatinine has risen with diuresis, will monitor    Hypokalemia.  Resolved  Essential hypertension amlodipine and nebevilol.    Pernicious anemia Cell count has been stable.  Patient on B12 injections q 30 days.    Iron deficiency anemia, continue with oral iron supplementation.    GERD (gastroesophageal reflux disease) Continue with pantoprazole.    PVD (peripheral vascular disease) (HCC) Continue blood pressure control  Patient on aspirin, and clopidogrel Hold  cilostazol in the setting of heart failure.  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 Papillary muscle hypertrophy. Left ventricular ejection fraction, by  estimation, is 45 to 50%. The left ventricle has mildly decreased  function. The left ventricle demonstrates regional wall motion  abnormalities (see scoring diagram/findings for  description). There is severe concentric left ventricular hypertrophy of  the inferior segment. Left ventricular diastolic parameters are consistent  with Grade II diastolic dysfunction (pseudonormalization). Basal function  is preserved.   2. Right ventricular systolic function is normal. The right ventricular  size is normal. Tricuspid regurgitation signal is inadequate for assessing  PA pressure.   3. Left atrial size was severely dilated.   4. Mitral valve leaflet thickening and doming without clear  calcifications. The mitral valve is abnormal. Mild mitral valve  regurgitation.   5. The aortic valve is calcified. Aortic valve regurgitation is mild to  moderate. Moderate aortic valve stenosis. Aortic regurgitation PHT  measures 306 msec. Aortic valve area, by VTI measures 1.02 cm. Aortic  valve Vmax measures 3.02 m/s.   6. There is mild dilatation of the ascending aorta, measuring 39 mm. Mild  when indexed to age, gender, and BSA.   7. The inferior vena cava is normal in size with greater than 50%  respiratory variability, suggesting right atrial pressure of 3 mmHg.   Comparison(s): Prior images unable to be directly viewed, comparison made  by report only.   Antimicrobials:  Anti-infectives (From admission, onward)    Start     Dose/Rate Route Frequency Ordered Stop   05/04/23 0100  oseltamivir (TAMIFLU) capsule 30 mg        30 mg Oral Daily 05/04/23 0004 05/08/23 0835       Subjective: No new complaints  Objective: Vitals:   05/07/23 2036  05/08/23 0506 05/08/23 0812 05/08/23 1311  BP: 111/72 133/62    Pulse: 85 71  77  Resp: 16 16 17 15   Temp: 99 F (37.2 C) 98.5 F (36.9 C) 98.1 F (36.7 C) 98 F (36.7 C)  TempSrc: Oral Oral Oral Oral  SpO2: 98% 97%  100%  Weight:  38.7 kg    Height:  5\' 2"  (1.575 m)      Intake/Output Summary (Last 24 hours) at 05/08/2023 1831 Last data filed at 05/08/2023 0630 Gross per 24 hour  Intake 291.44 ml  Output --  Net 291.44 ml    Filed Weights   05/06/23 0500 05/07/23 0547 05/08/23 0506  Weight: 38.6 kg 38.9 kg 38.7 kg    Examination:  General: No acute distress. Cardiovascular: RRR Lungs: unlabored Neurological: Alert . Moves all extremities 4 with equal strength. Cranial nerves II through XII grossly intact. Extremities: No clubbing or cyanosis. No edema.   Data Reviewed: I have personally reviewed following labs and imaging studies  CBC: Recent Labs  Lab 05/03/23 1557 05/04/23 0354 05/07/23 0506 05/08/23 0326  WBC 8.5 9.3 12.1* 10.3  NEUTROABS 6.1  --  10.0* 9.0*  HGB 9.2* 9.4* 10.5* 9.5*  HCT 31.0* 31.3* 34.0* 30.9*  MCV 69.4* 70.2* 68.0* 67.2*  PLT 342 348 316 333    Basic Metabolic Panel: Recent Labs  Lab 05/04/23 0354 05/05/23 0420 05/06/23 0409 05/07/23 0506 05/08/23 0326  NA 145 141 144 143 146*  K 3.7 2.8* 3.8 3.9 3.3*  CL 111 104 108 107 108  CO2 22 23 23 25 26   GLUCOSE 96 117* 103* 91 79  BUN 22 23 24* 21 16  CREATININE 1.29* 1.34* 1.31* 1.32* 1.12*  CALCIUM 9.1 8.8* 9.2 9.5 9.3  MG  --   --  2.8* 2.3 2.0  PHOS  --   --   --  3.1 2.8    GFR: Estimated Creatinine Clearance: 24.1 mL/min (Siyah Mault) (by C-G formula based on SCr of 1.12 mg/dL (H)).  Liver Function Tests: Recent Labs  Lab 05/03/23 1557 05/04/23 0354 05/07/23 0506 05/08/23 0326  AST 74* 68* 56* 43*  ALT 46* 47* 39 33  ALKPHOS 59 56 56 56  BILITOT 0.8 1.1 0.9 1.1  PROT 7.0 6.7 7.5 7.0  ALBUMIN 3.5 3.3* 3.4* 2.9*    CBG: No results for input(s): "GLUCAP" in the last  168 hours.   Recent Results (from the past 240 hours)  Resp panel by RT-PCR (RSV, Flu Dartha Rozzell&B, Covid) Anterior Nasal Swab     Status: Abnormal   Collection Time: 05/03/23  3:57 PM   Specimen: Anterior Nasal Swab  Result Value Ref Range Status   SARS Coronavirus 2 by RT PCR NEGATIVE NEGATIVE Final    Comment: (NOTE) SARS-CoV-2 target nucleic acids are NOT DETECTED.  The SARS-CoV-2 RNA is generally detectable in upper respiratory specimens during the acute phase of infection. The lowest concentration of SARS-CoV-2 viral copies this assay can detect is 138 copies/mL. Aubry Rankin negative result does not preclude SARS-Cov-2 infection and should not be used as the sole basis for treatment or other patient management decisions. Suhaan Perleberg negative result may occur with  improper specimen collection/handling, submission of specimen other than nasopharyngeal swab, presence of viral mutation(s) within the areas targeted by this assay, and inadequate number of viral copies(<138 copies/mL). Linnaea Ahn negative result must be combined with clinical observations, patient history, and epidemiological information. The expected result is Negative.  Fact Sheet for Patients:  BloggerCourse.com  Fact Sheet for Healthcare Providers:  SeriousBroker.it  This test is no t yet approved or cleared by the [place] FDA and  has been authorized for detection and/or diagnosis of SARS-CoV-2 by FDA under an Emergency Use Authorization (EUA). This EUA will remain  in effect (meaning this test can be used) for the duration of the COVID-19 declaration under Section 564(b)(1) of the Act, 21 U.S.C.section 360bbb-3(b)(1), unless the authorization is terminated  or revoked sooner.       Influenza Mekaila Tarnow by PCR POSITIVE (Evelyn Moch) NEGATIVE Final   Influenza B by PCR NEGATIVE NEGATIVE Final    Comment: (NOTE) The Xpert Xpress SARS-CoV-2/FLU/RSV plus assay is intended as an aid in the diagnosis of  influenza from Nasopharyngeal swab specimens and should not be used as Olson Lucarelli sole basis for treatment. Nasal washings and aspirates are unacceptable for Xpert Xpress SARS-CoV-2/FLU/RSV testing.  Fact Sheet for Patients: BloggerCourse.com  Fact Sheet for Healthcare Providers: SeriousBroker.it  This test is not yet approved or cleared by the [place] FDA and has been authorized for detection and/or diagnosis of SARS-CoV-2 by FDA under an Emergency Use Authorization (EUA). This EUA will remain in effect (meaning this test can be used) for the duration of the COVID-19 declaration under Section 564(b)(1) of the Act, 21 U.S.C. section 360bbb-3(b)(1), unless the authorization is terminated or revoked.     Resp Syncytial Virus by PCR NEGATIVE NEGATIVE Final    Comment: (  NOTE) Fact Sheet for Patients: BloggerCourse.com  Fact Sheet for Healthcare Providers: SeriousBroker.it  This test is not yet approved or cleared by the [place] FDA and has been authorized for detection and/or diagnosis of SARS-CoV-2 by FDA under an Emergency Use Authorization (EUA). This EUA will remain in effect (meaning this test can be used) for the duration of the COVID-19 declaration under Section 564(b)(1) of the Act, 21 U.S.C. section 360bbb-3(b)(1), unless the authorization is terminated or revoked.  Performed at [facility], [address] [place], [place] [address]          Radiology Studies: CT CHEST ABDOMEN PELVIS WO CONTRAST Result Date: 05/08/2023 CLINICAL DATA:  Weight loss, unintended weight loss EXAM: CT CHEST, ABDOMEN AND PELVIS WITHOUT CONTRAST TECHNIQUE: Multidetector CT imaging of the chest, abdomen and pelvis was performed following the standard protocol without IV contrast. RADIATION DOSE REDUCTION: This exam was performed according to the departmental dose-optimization  program which includes automated exposure control, adjustment of the mA and/or kV according to patient size and/or use of iterative reconstruction technique. COMPARISON:  CT abdomen pelvis 10/23/2019 FINDINGS: CT CHEST FINDINGS Cardiovascular: Ascending thoracic aorta measures 4.2 cm. Proximal descending thoracic aorta measures 3.4 cm. Distal descending thoracic aorta measures 3.0 cm. Diffuse atherosclerotic calcifications in the thoracic aorta. Coronary artery calcifications. Mildly enlarged heart without pericardial effusion. 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 Left thyroid tissue is unremarkable. No significant lymph node enlargement in the chest but the limited evaluation without intravascular contrast. There is some oral contrast in the esophagus. No axillary lymph node enlargement. Lungs/Pleura: Centrilobular emphysema. Motion artifact limits evaluation of the lungs but no significant airspace disease or lung consolidation. No large pleural effusions. No suspicious lung lesions. Musculoskeletal: Old fractures of left ninth and tenth ribs. No acute bone abnormality. CT ABDOMEN PELVIS FINDINGS Hepatobiliary: Normal appearance of the liver and gallbladder. Pancreas: Unremarkable. No pancreatic ductal dilatation or surrounding inflammatory changes. Spleen: Again noted is Starleen Trussell fullness along the medial aspect of the spleen and there is concern for lesion in this area based on the previous contrast study from 2023. This area roughly measures 3.3 x 2.6 cm and previously measured 3.1 x 2.5 cm. Difficult to evaluate for interval change. Again noted are areas of cortical scarring in the right kidney. Limited evaluation due to motion artifact and streak artifact in the abdomen. Negative for hydronephrosis. Probable areas of cortical scarring in the left kidney are similar to the previous examination. Negative for kidney stones. Moderate amount of fluid in the  urinary bladder. No bladder stones. Stomach/Bowel: Colonic diverticulosis without acute inflammation. No bowel dilatation or obstruction. Normal appearance of stomach. Vascular/Lymphatic: Abdominal aorta measures up to 2.9 cm. Diffuse atherosclerotic calcifications involving the abdominal aorta. No significant lymph node enlargement in the abdomen or pelvis. Iliac arteries are calcified. Reproductive: Status post hysterectomy. No adnexal masses. Again noted are calcifications in the bilateral adnexal regions. Other: Negative for free fluid. Negative for free air. Streak artifact in the abdomen due to an external metallic structure lateral to the right side of the abdomen. Musculoskeletal: No acute bone abnormality. IMPRESSION: 1. No acute abnormality in the chest, abdomen or pelvis. Limited examination due to motion artifact and streak artifact. 2. Indeterminate splenic lesion is again noted. Difficult to evaluate for interval change. This area could be better characterized with postcontrast CT or MR. 3. 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 Recommend annual imaging followup  by CTA or MRA. This recommendation follows 2010 ACCF/AHA/AATS/ACR/ASA/SCA/SCAI/SIR/STS/SVM Guidelines for the Diagnosis and Management of Patients with Thoracic Aortic Disease. Circulation. 2010; 121: Y782-N562. Aortic aneurysm NOS (ICD10-I71.9) 4. Colonic diverticulosis without acute inflammation. 5. Aortic Atherosclerosis (ICD10-I70.0) and Emphysema (ICD10-J43.9). Electronically Signed   By: Richarda Overlie M.D.   On: 05/08/2023 13:36   CT HEAD WO CONTRAST ( ) Result Date: 05/08/2023 CLINICAL DATA:  Neuro deficit, acute, stroke suspected. Weight loss and weakness. EXAM: CT HEAD WITHOUT CONTRAST TECHNIQUE: Contiguous axial images were obtained from the base of the skull through the vertex without intravenous contrast. RADIATION DOSE REDUCTION: This exam was performed according to the departmental dose-optimization  program which includes automated exposure control, adjustment of the mA and/or kV according to patient size and/or use of iterative reconstruction technique. COMPARISON:  CT head without contrast 07/27/2015. FINDINGS: Brain: No acute infarct, hemorrhage, or mass lesion is present. Progressive atrophy and white matter disease is moderately advanced for age. No acute infarct, hemorrhage, or mass lesion is present. The ventricles are proportionate to the degree of atrophy. No significant extraaxial fluid collection is present. The brainstem and cerebellum are within normal limits. Midline structures are within normal limits. Vascular: Atherosclerotic calcifications are present within the cavernous internal carotid arteries bilaterally. No hyperdense vessel is present. Skull: Calvarium is intact. No focal lytic or blastic lesions are present. No significant extracranial soft tissue lesion is present. Sinuses/Orbits: The paranasal sinuses and mastoid air cells are clear. Bilateral lens replacements are noted. Globes and orbits are otherwise unremarkable. IMPRESSION: 1. No acute intracranial abnormality or significant interval change. 2. Progressive atrophy and white matter disease is moderately advanced for age. This likely reflects the sequela of chronic microvascular ischemia. Electronically Signed   By: Marin Roberts M.D.   On: 05/08/2023 10:49        Scheduled Meds:  allopurinol  100 mg Oral QPM   aspirin EC  81 mg Oral Daily   atorvastatin  80 mg Oral QPM   clopidogrel  75 mg Oral Daily   docusate sodium  100 mg Oral BID   empagliflozin  10 mg Oral Daily   feeding supplement  237 mL Oral TID BM   ferrous sulfate  325 mg Oral Daily   multivitamin with minerals  1 tablet Oral Daily   Continuous Infusions:  dextrose 75 mL/hr at 05/08/23 0838   heparin 600 Units/hr (05/08/23 1751)   thiamine (VITAMIN B1) injection 500 mg (05/08/23 1548)   Followed by   Melene Muller ON 05/10/2023] thiamine  (VITAMIN B1) injection       LOS: 4 days    Time spent: over 30 min    Lacretia Nicks, MD [facility]   To contact the attending provider between 7A-7P or the covering provider during after hours 7P-7A, please log into the web site [email or web address] and access using universal [place] password for that web site. If you do not have the password, please call the hospital operator.  05/08/2023, 6:31 PM

## 2023-05-08 NOTE — Plan of Care (Signed)
  Problem: Activity: Goal: Risk for activity intolerance will decrease Outcome: Progressing   Problem: Nutrition: Goal: Adequate nutrition will be maintained Outcome: Progressing   Problem: Pain Management: Goal: General experience of comfort will improve Outcome: Progressing   Problem: Safety: Goal: Ability to remain free from injury will improve Outcome: Progressing   Problem: Activity: Goal: Capacity to carry out activities will improve Outcome: Progressing

## 2023-05-08 NOTE — Progress Notes (Signed)
   05/08/23 0655  Provider Notification  Provider Name/Title [facility]  Date Provider Notified 05/08/23  Time Provider Notified [phone]  Method of Notification Page  Notification Reason New onset of dysrhythmia  Type of New Onset of Dysrhythmia Atrial fibrillation  Symptoms of New Onset of Dysrhythmia Asymptomatic   RN received call from CCMD stating that patient's heart rate was in the 130s and appeared to be in afib.  RN obtained EKG to confirm rhythm.  EKG show patient to have afib with HR of 132, about 2 minutes after EKG completed patient converted from afib to sinus rhythm with HR in 70s.  RN notified provider on call

## 2023-05-08 NOTE — Progress Notes (Signed)
PHARMACY - ANTICOAGULATION CONSULT NOTE  Pharmacy Consult for heparin Indication: atrial fibrillation  Allergies  Allergen Reactions   Lactose Intolerance (Gi) Diarrhea    Patient Measurements: Height: 5\' 2"  (157.5 cm) Weight: 38.7 kg (85 lb 4.8 oz) IBW/kg (Calculated) : 50.1 Heparin Dosing Weight: TBW  Vital Signs: Temp: 98 F (36.7 C) (12/28 1311) Temp Source: Oral (12/28 1311) Pulse Rate: 77 (12/28 1311)  Labs: Recent Labs    05/06/23 0409 05/07/23 0506 05/08/23 0326 05/08/23 1643  HGB  --  10.5* 9.5*  --   HCT  --  34.0* 30.9*  --   PLT  --  316 333  --   HEPARINUNFRC  --   --   --  0.21*  CREATININE 1.31* 1.32* 1.12*  --     Estimated Creatinine Clearance: 24.1 mL/min (A) (by C-G formula based on SCr of 1.12 mg/dL (H)).   Medical History: Past Medical History:  Diagnosis Date   Abdominal bruit    Abnormal echocardiogram    Anemia    Aortic insufficiency    Aortic root dilation (HCC)    Aortic valve disorder    Arthritis    "all over" (07/22/2012)   Arthropathy    Bilateral carotid artery stenosis    Carotid artery disease (HCC)    Chest discomfort    Edema    Exertional shortness of breath    Fatigue    GERD (gastroesophageal reflux disease)    Gout    Hypercholesteremia    Hypertension    Mitral regurgitation and aortic stenosis    Mixed hyperlipidemia    Murmur, cardiac    Peripheral vascular disease (HCC)    Pernicious anemia    PVD (peripheral vascular disease) (HCC)    Shortness of breath    Sickle-cell trait (HCC)    Thoracic aortic aneurysm (HCC)    Tobacco use    URI (upper respiratory infection)       Assessment: 49 YOF with new onset atrial fibrillation and new onset CHF. CHADsVASc score of 5 (age, sex, HTN, HF). Pharmacy consulted for heparin initiation. Patient started on heparin 5000 units subcutaneously every 8 hours this admission for VTE prophylaxis, last dose given at 0553 on 12/28. Noted patient is on aspirin and  clopidogrel, for peripheral vascular disease. Patient also has a history of epistaxis requiring visits to the ED.  No anticoagulation prior to admission. Note low weight. Pharmacy consulted for heparin.    Heparin level 0.21 is subtherapeutic on 550 units/hr.  No issues with infusion or bleeding per RN.   Goal of Therapy:  Heparin level 0.3-0.7 units/ml Monitor platelets by anticoagulation protocol: Yes   Plan:  Increase heparin infusion to 600 units/hr  Monitor daily heparin level, CBC, signs/symptoms of bleeding  F/u long-term anticoagulation plan  Alphia Moh, PharmD, BCPS, [facility] Clinical Pharmacist  Please check AMION for all [facility] Pharmacy phone numbers After 10:00 PM, call Main Pharmacy [phone]

## 2023-05-08 NOTE — Progress Notes (Addendum)
Informed by nursing patient's went to A-fib with RVR.  Documented EKG CHADvasc 5 [age, sex, HTN, CHF] 5 mg IV diltiazem x 1 given.  Norvasc DC'd.  Heparin drip initiated. Occult stool ordered Dr. Elijah Birk alerted.

## 2023-05-09 DIAGNOSIS — R634 Abnormal weight loss: Secondary | ICD-10-CM | POA: Diagnosis not present

## 2023-05-09 DIAGNOSIS — I509 Heart failure, unspecified: Secondary | ICD-10-CM | POA: Diagnosis not present

## 2023-05-09 DIAGNOSIS — Z87891 Personal history of nicotine dependence: Secondary | ICD-10-CM

## 2023-05-09 DIAGNOSIS — K140 Glossitis: Secondary | ICD-10-CM | POA: Diagnosis not present

## 2023-05-09 DIAGNOSIS — R1310 Dysphagia, unspecified: Secondary | ICD-10-CM | POA: Diagnosis not present

## 2023-05-09 DIAGNOSIS — G9340 Encephalopathy, unspecified: Secondary | ICD-10-CM

## 2023-05-09 DIAGNOSIS — I5041 Acute combined systolic (congestive) and diastolic (congestive) heart failure: Secondary | ICD-10-CM | POA: Diagnosis not present

## 2023-05-09 DIAGNOSIS — R638 Other symptoms and signs concerning food and fluid intake: Secondary | ICD-10-CM

## 2023-05-09 LAB — BASIC METABOLIC PANEL
Anion gap: 9 (ref 5–15)
BUN: 14 mg/dL (ref 8–23)
CO2: 25 mmol/L (ref 22–32)
Calcium: 9 mg/dL (ref 8.9–10.3)
Chloride: 102 mmol/L (ref 98–111)
Creatinine, Ser: 1.14 mg/dL — ABNORMAL HIGH (ref 0.44–1.00)
GFR, Estimated: 48 mL/min — ABNORMAL LOW (ref >60)
Glucose, Bld: 131 mg/dL — ABNORMAL HIGH (ref 70–99)
Potassium: 4 mmol/L (ref 3.5–5.1)
Sodium: 136 mmol/L (ref 135–145)

## 2023-05-09 LAB — CBC
HCT: 28 % — ABNORMAL LOW (ref 36.0–46.0)
Hemoglobin: 8.7 g/dL — ABNORMAL LOW (ref 12.0–15.0)
MCH: 20.7 pg — ABNORMAL LOW (ref 26.0–34.0)
MCHC: 31.1 g/dL (ref 30.0–36.0)
MCV: 66.7 fL — ABNORMAL LOW (ref 80.0–100.0)
Platelets: 310 10*3/uL (ref 150–400)
RBC: 4.2 MIL/uL (ref 3.87–5.11)
RDW: 19.8 % — ABNORMAL HIGH (ref 11.5–15.5)
WBC: 8.7 10*3/uL (ref 4.0–10.5)
nRBC: 0.6 % — ABNORMAL HIGH (ref 0.0–0.2)

## 2023-05-09 LAB — HEMOGLOBIN AND HEMATOCRIT, BLOOD
HCT: 28.2 % — ABNORMAL LOW (ref 36.0–46.0)
Hemoglobin: 8.7 g/dL — ABNORMAL LOW (ref 12.0–15.0)

## 2023-05-09 LAB — HEPARIN LEVEL (UNFRACTIONATED)
Heparin Unfractionated: 0.25 [IU]/mL — ABNORMAL LOW (ref 0.30–0.70)
Heparin Unfractionated: 0.25 [IU]/mL — ABNORMAL LOW (ref 0.30–0.70)

## 2023-05-09 LAB — FOLATE: Folate: 7.8 ng/mL (ref >5.9)

## 2023-05-09 MED ORDER — LIDOCAINE VISCOUS HCL 2 % MT SOLN
15.0000 mL | Freq: Four times a day (QID) | OROMUCOSAL | Status: DC | PRN
  Administered 2023-05-09: 15 mL via OROMUCOSAL
  Filled 2023-05-09 (×2): qty 15

## 2023-05-09 MED ORDER — ALUM & MAG HYDROXIDE-SIMETH 200-200-20 MG/5ML PO SUSP
30.0000 mL | Freq: Once | ORAL | Status: AC
  Administered 2023-05-09: 30 mL via ORAL
  Filled 2023-05-09: qty 30

## 2023-05-09 MED ORDER — ITRACONAZOLE 10 MG/ML PO SOLN
200.0000 mg | ORAL | Status: DC
  Filled 2023-05-09: qty 20

## 2023-05-09 MED ORDER — ORAL CARE MOUTH RINSE: 15.0000 mL | OROMUCOSAL | Status: DC | PRN

## 2023-05-09 MED ORDER — ITRACONAZOLE 100 MG PO CAPS
200.0000 mg | ORAL_CAPSULE | ORAL | Status: DC
  Administered 2023-05-09 – 2023-05-16 (×8): 200 mg via ORAL
  Filled 2023-05-09 (×8): qty 2

## 2023-05-09 NOTE — Evaluation (Addendum)
Clinical/Bedside Swallow Evaluation Patient Details  Name: Holly Guerra MRN: [id number] Date of Birth: [birth year]  Today's Date: 05/09/2023 Time: SLP Start Time (ACUTE ONLY): 1510 SLP Stop Time (ACUTE ONLY): 1543 SLP Time Calculation (min) (ACUTE ONLY): 33 min  Past Medical History:  Past Medical History:  Diagnosis Date   Abdominal bruit    Abnormal echocardiogram    Anemia    Aortic insufficiency    Aortic root dilation (HCC)    Aortic valve disorder    Arthritis    "all over" (07/22/2012)   Arthropathy    Bilateral carotid artery stenosis    Carotid artery disease (HCC)    Chest discomfort    Edema    Exertional shortness of breath    Fatigue    GERD (gastroesophageal reflux disease)    Gout    Hypercholesteremia    Hypertension    Mitral regurgitation and aortic stenosis    Mixed hyperlipidemia    Murmur, cardiac    Peripheral vascular disease (HCC)    Pernicious anemia    PVD (peripheral vascular disease) (HCC)    Shortness of breath    Sickle-cell trait (HCC)    Thoracic aortic aneurysm (HCC)    Tobacco use    URI (upper respiratory infection)    Past Surgical History:  Past Surgical History:  Procedure Laterality Date   ABDOMINAL AORTOGRAM W/LOWER EXTREMITY Bilateral 05/23/2020   Procedure: ABDOMINAL AORTOGRAM W/LOWER EXTREMITY;  Surgeon: Runell Gess, MD;  Location: [facility] INVASIVE CV LAB;  Service: Cardiovascular;  Laterality: Bilateral;   ABDOMINAL HYSTERECTOMY  1970's   APPENDECTOMY  1970's   CARDIOVASCULAR STRESS TEST  06/16/2012   normal myocardial perfusion imaging, LV systolic function was normal without regional wall motion abnormalities, LV EF 60%   DOPPLER ECHOCARDIOGRAPHY  06/16/2012   EF 60-65%, mild concetric LVH, moderate aortic regurg   LOWER EXTREMITY ANGIOGRAM N/A 07/22/2012   Procedure: LOWER EXTREMITY ANGIOGRAM;  Surgeon: Runell Gess, MD;  Location: [facility] CATH LAB;  Service: Cardiovascular;  Laterality: N/A;   LOWER EXTREMITY ARTERIAL  DOPPLER  08/08/2012   Left SFA stent-open and patent without evidence of hemodynamically significant stenosis   PERCUTANEOUS STENT INTERVENTION Left 07/22/2012   TOTAL THYROIDECTOMY  ~ 2010   HPI:  Holly Guerra is an 82 yo female presenting to ED 12/23 with SOB. Admitted with acute heart failure in the setting of influenza A infection. GI consulted with recommendations to complete a barium esophagram to rule out esophageal mass, stricture, or inflammation but acknowledges pt does not exhibit typical signs of esophageal dysphagia such as early satiety or regurgitation. PMH includes aortic valve disease, PVD s/p stenting 07/2012, GERD, pernicious anemia, essential HTN    Assessment / Plan / Recommendation  Clinical Impression  Pt reports odynophagia and decreased PO intake. Her family endorses some difficulty swallowing pills with liquid. Oral motor exam significant for  gray and black sores on posterior and R lateral lingual surface. 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 No signs clinically concerning for aspiration were observed throughout all trials of thin liquids via straw, purees, or solids. She was quite hesistant to try the graham crackers due to her reports of hard textures irritating her tongue. Pt often held boluses within her oral cavity, which is suspected to be a response to anticipated pain once the swallow is initiated. Offered pt and her family options for diet modification to which they stated a preference that she be allowed to  continue a regular diet with increased assistance ordering foods that she deems easy. Feel that restricting pt's diet will lead to further decreased intake. Provided education re: prioritizing protein-rich food options, trying softer foods and soups, and avoiding tough or crunchy options. Discussed with RD and RN. SLP will follow briefly to ensure pt and her family are still in agreement with the current plan. SLP  Visit Diagnosis: Dysphagia, unspecified (R13.10)    Aspiration Risk  Mild aspiration risk    Diet Recommendation Regular;Thin liquid    Liquid Administration via: Cup;Straw Medication Administration: Whole meds with puree Supervision: Staff to assist with self feeding;Full supervision/cueing for compensatory strategies Compensations: Minimize environmental distractions;Slow rate;Small sips/bites Postural Changes: Seated upright at 90 degrees;Remain upright for at least 30 minutes after po intake    Other  Recommendations Oral Care Recommendations: Oral care BID    Recommendations for follow up therapy are one component of a multi-disciplinary discharge planning process, led by the attending physician.  Recommendations may be updated based on patient status, additional functional criteria and insurance authorization.  Follow up Recommendations No SLP follow up      Assistance Recommended at Discharge    Functional Status Assessment Patient has had a recent decline in their functional status and demonstrates the ability to make significant improvements in function in a reasonable and predictable amount of time.  Frequency and Duration min 1 x/week  1 week       Prognosis Prognosis for improved oropharyngeal function: Good Barriers to Reach Goals: Cognitive deficits      Swallow Study   General HPI: Holly Guerra is an 82 yo female presenting to ED 12/23 with SOB. Admitted with acute heart failure in the setting of influenza A infection. GI consulted with recommendations to complete a barium esophagram to rule out esophageal mass, stricture, or inflammation but acknowledges pt does not exhibit typical signs of esophageal dysphagia such as early satiety or regurgitation. PMH includes aortic valve disease, PVD s/p stenting 07/2012, GERD, pernicious anemia, essential HTN Type of Study: Bedside Swallow Evaluation Previous Swallow Assessment: none in chart Diet Prior to this Study:  Regular;Thin liquids (Level 0) Temperature Spikes Noted: No Respiratory Status: Room air History of Recent Intubation: No Behavior/Cognition: Alert;Cooperative;Pleasant mood;Requires cueing Oral Cavity Assessment: Lesions;Other (comment) (grayish/black sores on pt's posterior and lateral R lingual surface) Oral Care Completed by SLP: No Oral Cavity - Dentition: Missing dentition Vision: Functional for self-feeding Self-Feeding Abilities: Needs assist Patient Positioning: Upright in bed Baseline Vocal Quality: Normal Volitional Cough: Strong Volitional Swallow: Able to elicit    Oral/Motor/Sensory Function Overall Oral Motor/Sensory Function: Within functional limits   Ice Chips Ice chips: Not tested   Thin Liquid Thin Liquid: Within functional limits Presentation: Straw;Self Fed    Nectar Thick Nectar Thick Liquid: Not tested   Honey Thick Honey Thick Liquid: Not tested   Puree Puree: Within functional limits Presentation: Spoon;Self Fed   Solid     Solid: Within functional limits Presentation: Self Fed      Gwynneth Aliment, M.A., CF-SLP Speech Language Pathology, Acute Rehabilitation Services  Secure Chat preferred [phone]  05/09/2023,4:21 PM

## 2023-05-09 NOTE — Progress Notes (Signed)
PROGRESS NOTE    Holly Guerra  QMV:[id number] DOB: 02/23/1941 DOA: 05/03/2023 PCP: Holly Rud, PA-C  Chief Complaint  Patient presents with   Weakness    Brief Narrative:   Holly Guerra was admitted to the hospital with the working diagnosis of acute heart failure in the setting of influenza Holly Guerra infection.    [name] with history of aortic insufficiency, peripheral vascular disease, GERD, essential hypertension, pernicious anemia presented to the ED yesterday with multiple symptoms, progressive shortness of breath X 1 week, some swelling, cough and orthopnea, in addition also complained of congestion sinus pain and headaches. -In the ED she was noted to be mildly hypoxic, BNP 4119, respiratory virus panel positive for influenza Holly Guerra, hemoglobin 9.2, creatinine 1.03,    Chest radiograph with cardiomegaly with bilateral hilar vascular congestion and cephalization of the vasculature.    EKG 89 bpm, left axis deviation, left anterior fascicular block, qtc 508, sinus rhythm with poor R R wave progression, no significant ST segment changes and negative T wave V5 and V6.   Assessment & Plan:   Principal Problem:   Acute heart failure (HCC) Active Problems:   Influenza Holly Guerra with pneumonia   Essential hypertension   AKI (acute kidney injury) (HCC)   Pernicious anemia   GERD (gastroesophageal reflux disease)   PVD (peripheral vascular disease) (HCC)  Goals of Care Daughter brought up that family was considering her going home.  I think it'll be important to have additional goals of care conversations before this happens.  Daughter Holly Guerra) open to this conversation.  Brought up DNR, but she notes they've had issues discussing this with the patient in the past.  Will ask palliative care to assist with discussion.  She's had significant weight loss and poor PO intake recently.  Workup as noted below.  Failure to Thrive Weight Loss  Poor PO Intake Odynophagia  Glossitis  Concern for possible  candidiasis Today she notes oral pain and pain with swallowing, present for at least 5 weeks or so - she has greyish/brown coating on tongue as well as ulcerations with grey slough.  Not classic appearance for thrush, but treating empirically for oropharyngeal and esophageal candidiasis with itraconazole with her pain/discomfort (no fluconazole due to plavix). GI consulted, appreciate assistance Barium esophagram  Follow B12 with glossitis  CT abdomen/pelvis without acute abnormality (limited eval due to motion and streak artifact) RD previously recommended cortrak, daughter Holly Guerra wasn't interested with concern she may pull this.  Will avoid for now.  Will see how she does with above measures.  Atrial Fibrillation with RVR Back in sinus, will start metoprolol  Continue heparin gtt for now  Acute Metabolic Encephalopathy Dysarthria  Per discussion with daughter, waxing/waning encephalopathy, dysarthria for the past week Likely due to influenza infection  Will check head CT -> progressive atrophy and white matter disease, advanced for age High dose thiamine  Delirium precautions  Indeterminate Splenic Lesion Consider postcontrast CT vs MR   New onset of congestive heart failure (HCC) Echo with EF 45-50%, regional wall motion abnormalities, grade II diastolic dysfunction, moderate AS  Spironolactone, jardiance Lasix   Influenza Holly Guerra with pneumonia No signs of bacterial superinfection.  oseltamivir for antiviral therapy.    Regional Wall Motion Abnormality Plan for outpatient cardiology follow up   AKI (acute kidney injury) (HCC) Creatinine has risen with diuresis, will monitor    Hypokalemia.  Resolved  Essential hypertension amlodipine and nebevilol.    Pernicious anemia Cell count has been stable.  Patient  on B12 injections q 30 days.    Iron deficiency anemia, continue with oral iron supplementation.    GERD (gastroesophageal reflux disease) Continue with pantoprazole.     PVD (peripheral vascular disease) (HCC) Continue blood pressure control  Patient on aspirin, and clopidogrel Hold cilostazol in the setting of heart failure.  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 Papillary muscle hypertrophy. Left ventricular ejection fraction, by  estimation, is 45 to 50%. The left ventricle has mildly decreased  function. The left ventricle demonstrates regional wall motion  abnormalities (see scoring diagram/findings for  description). There is severe concentric left ventricular hypertrophy of  the inferior segment. Left ventricular diastolic parameters are consistent  with Grade II diastolic dysfunction (pseudonormalization). Basal function  is preserved.   2. Right ventricular systolic function is normal. The right ventricular  size is normal. Tricuspid regurgitation signal is inadequate for assessing  PA pressure.   3. Left atrial size was severely dilated.   4. Mitral valve leaflet thickening and doming without clear  calcifications. The mitral valve is abnormal. Mild mitral valve  regurgitation.   5. The aortic valve is calcified. Aortic valve regurgitation is mild to  moderate. Moderate aortic valve stenosis. Aortic regurgitation PHT  measures 306 msec. Aortic valve area, by VTI measures 1.02 cm. Aortic  valve Vmax measures 3.02 m/s.   6. There is mild dilatation of the ascending aorta, measuring 39 mm. Mild  when indexed to age, gender, and BSA.   7. The inferior vena cava is normal in size with greater than 50%  respiratory variability, suggesting right atrial pressure of 3 mmHg.   Comparison(s): Prior images unable to be directly viewed, comparison made  by report only.    Antimicrobials:  Anti-infectives (From admission, onward)    Start     Dose/Rate Route Frequency Ordered Stop   05/09/23 1800  itraconazole (SPORANOX) 10 MG/ML solution 200 mg  Status:  Discontinued        200 mg Oral Every 24 hours 05/09/23 1616 05/09/23 1624   05/09/23 1800  itraconazole (SPORANOX) capsule 200 mg        200 mg Oral Every 24 hours 05/09/23 1624     05/04/23 0100  oseltamivir (TAMIFLU) capsule 30 mg        30 mg Oral Daily 05/04/23 0004 05/08/23 0835       Subjective: Notes pain with swallowing  Objective: Vitals:   05/08/23 2340 05/09/23 0411 05/09/23 0818 05/09/23 1647  BP:  112/67 103/63 120/68  Pulse:  63 63 63  Resp: 16 16  18   Temp:  (!) 97.4 F (36.3 C) (!) 97.4 F (36.3 C) 97.7 F (36.5 C)  TempSrc:  Oral Oral Oral  SpO2:  100% 95% 94%  Weight:  39.9 kg    Height:        Intake/Output Summary (Last 24 hours) at 05/09/2023 1724 Last data filed at 05/09/2023 0440 Gross per 24 hour  Intake 1336.4 ml  Output 100 ml  Net 1236.4 ml    Filed Weights   05/07/23 0547 05/08/23 0506 05/09/23 0411  Weight: 38.9 kg 38.7 kg 39.9 kg    Examination:  General: No acute distress. Tongue with grey/brownish coating at base, ulceration with grey  slough on right distal tongue, some scattered ulcerations with white base, not classic for thrush Cardiovascular: RRR Lungs: unlabored Abdomen: Soft, nontender, nondistended Neurological: Alert , but disoriented, difficult to understand at times (DIL notes due to missing dentures). Moves all extremities 4. Cranial nerves II through XII grossly intact. Extremities: No clubbing or cyanosis. No edema.   Data Reviewed: I have personally reviewed following labs and imaging studies  CBC: Recent Labs  Lab 05/03/23 1557 05/04/23 0354 05/07/23 0506 05/08/23 0326 05/09/23 0314 05/09/23 0849  WBC 8.5 9.3 12.1* 10.3 8.7  --   NEUTROABS 6.1  --  10.0* 9.0*  --   --   HGB 9.2* 9.4* 10.5* 9.5* 8.7* 8.7*  HCT  31.0* 31.3* 34.0* 30.9* 28.0* 28.2*  MCV 69.4* 70.2* 68.0* 67.2* 66.7*  --   PLT 342 348 316 333 310  --     Basic Metabolic Panel: Recent Labs  Lab 05/05/23 0420 05/06/23 0409 05/07/23 0506 05/08/23 0326 05/09/23 0314  NA 141 144 143 146* 136  K 2.8* 3.8 3.9 3.3* 4.0  CL 104 108 107 108 102  CO2 23 23 25 26 25   GLUCOSE 117* 103* 91 79 131*  BUN 23 24* 21 16 14   CREATININE 1.34* 1.31* 1.32* 1.12* 1.14*  CALCIUM 8.8* 9.2 9.5 9.3 9.0  MG  --  2.8* 2.3 2.0  --   PHOS  --   --  3.1 2.8  --     GFR: Estimated Creatinine Clearance: 24.4 mL/min (Tanya Marvin) (by C-G formula based on SCr of 1.14 mg/dL (H)).  Liver Function Tests: Recent Labs  Lab 05/03/23 1557 05/04/23 0354 05/07/23 0506 05/08/23 0326  AST 74* 68* 56* 43*  ALT 46* 47* 39 33  ALKPHOS 59 56 56 56  BILITOT 0.8 1.1 0.9 1.1  PROT 7.0 6.7 7.5 7.0  ALBUMIN 3.5 3.3* 3.4* 2.9*    CBG: No results for input(s): "GLUCAP" in the last 168 hours.   Recent Results (from the past 240 hours)  Resp panel by RT-PCR (RSV, Flu Crystalynn Mcinerney&B, Covid) Anterior Nasal Swab     Status: Abnormal   Collection Time: 05/03/23  3:57 PM   Specimen: Anterior Nasal Swab  Result Value Ref Range Status   SARS Coronavirus 2 by RT PCR NEGATIVE NEGATIVE Final    Comment: (NOTE) SARS-CoV-2 target nucleic acids are NOT DETECTED.  The SARS-CoV-2 RNA is generally detectable in upper respiratory specimens during the acute phase of infection. The lowest concentration of SARS-CoV-2 viral copies this assay can detect is 138 copies/mL. Jaysean Manville negative result does not preclude SARS-Cov-2 infection and should not be used as the sole basis for treatment or other patient management decisions. Tamaiya Bump negative result may occur with  improper specimen collection/handling, submission of specimen other than nasopharyngeal swab, presence of viral mutation(s) within the areas targeted by this assay, and inadequate number of viral copies(<138 copies/mL). Maite Burlison negative result must be  combined with clinical observations, patient history, and epidemiological information. The expected result is Negative.  Fact Sheet for Patients:  BloggerCourse.com  Fact Sheet for Healthcare Providers:  SeriousBroker.it  This test is no t yet approved or cleared by the [place] FDA and  has been authorized for detection and/or diagnosis of SARS-CoV-2 by FDA under an Emergency Use Authorization (EUA). This EUA will remain  in effect (meaning this test can be used) for the duration of the COVID-19 declaration under Section 564(b)(1) of the Act, 21 U.S.C.section 360bbb-3(b)(1), unless the authorization is terminated  or  revoked sooner.       Influenza Rohnan Bartleson by PCR POSITIVE (Tyreon Frigon) NEGATIVE Final   Influenza B by PCR NEGATIVE NEGATIVE Final    Comment: (NOTE) The Xpert Xpress SARS-CoV-2/FLU/RSV plus assay is intended as an aid in the diagnosis of influenza from Nasopharyngeal swab specimens and should not be used as Zoiey Christy sole basis for treatment. Nasal washings and aspirates are unacceptable for Xpert Xpress SARS-CoV-2/FLU/RSV testing.  Fact Sheet for Patients: BloggerCourse.com  Fact Sheet for Healthcare Providers: SeriousBroker.it  This test is not yet approved or cleared by the [place] FDA and has been authorized for detection and/or diagnosis of SARS-CoV-2 by FDA under an Emergency Use Authorization (EUA). This EUA will remain in effect (meaning this test can be used) for the duration of the COVID-19 declaration under Section 564(b)(1) of the Act, 21 U.S.C. section 360bbb-3(b)(1), unless the authorization is terminated or revoked.     Resp Syncytial Virus by PCR NEGATIVE NEGATIVE Final    Comment: (NOTE) Fact Sheet for Patients: BloggerCourse.com  Fact Sheet for Healthcare Providers: SeriousBroker.it  This test is not  yet approved or cleared by the [place] FDA and has been authorized for detection and/or diagnosis of SARS-CoV-2 by FDA under an Emergency Use Authorization (EUA). This EUA will remain in effect (meaning this test can be used) for the duration of the COVID-19 declaration under Section 564(b)(1) of the Act, 21 U.S.C. section 360bbb-3(b)(1), unless the authorization is terminated or revoked.  Performed at [facility], [address] [place], [place] [address]          Radiology Studies: CT CHEST ABDOMEN PELVIS WO CONTRAST Result Date: 05/08/2023 CLINICAL DATA:  Weight loss, unintended weight loss EXAM: CT CHEST, ABDOMEN AND PELVIS WITHOUT CONTRAST TECHNIQUE: Multidetector CT imaging of the chest, abdomen and pelvis was performed following the standard protocol without IV contrast. RADIATION DOSE REDUCTION: This exam was performed according to the departmental dose-optimization program which includes automated exposure control, adjustment of the mA and/or kV according to patient size and/or use of iterative reconstruction technique. COMPARISON:  CT abdomen pelvis 10/23/2019 FINDINGS: CT CHEST FINDINGS Cardiovascular: Ascending thoracic aorta measures 4.2 cm. Proximal descending thoracic aorta measures 3.4 cm. Distal descending thoracic aorta measures 3.0 cm. Diffuse atherosclerotic calcifications in the thoracic aorta. Coronary artery calcifications. Mildly enlarged heart without pericardial effusion. 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 Left thyroid tissue is unremarkable. No significant lymph node enlargement in the chest but the limited evaluation without intravascular contrast. There is some oral contrast in the esophagus. No axillary lymph node enlargement. Lungs/Pleura: Centrilobular emphysema. Motion artifact limits evaluation of the lungs but no significant airspace disease or lung consolidation. No large pleural  effusions. No suspicious lung lesions. Musculoskeletal: Old fractures of left ninth and tenth ribs. No acute bone abnormality. CT ABDOMEN PELVIS FINDINGS Hepatobiliary: Normal appearance of the liver and gallbladder. Pancreas: Unremarkable. No pancreatic ductal dilatation or surrounding inflammatory changes. Spleen: Again noted is Andrews Tener fullness along the medial aspect of the spleen and there is concern for lesion in this area based on the previous contrast study from 2023. This area roughly measures 3.3 x 2.6 cm and previously measured 3.1 x 2.5 cm. Difficult to evaluate for interval change. Again noted are areas of cortical scarring in the right kidney. Limited evaluation due to motion artifact and streak artifact in the abdomen. Negative for hydronephrosis. Probable areas of cortical scarring in the left kidney are similar to the  previous examination. Negative for kidney stones. Moderate amount of fluid in the urinary bladder. No bladder stones. Stomach/Bowel: Colonic diverticulosis without acute inflammation. No bowel dilatation or obstruction. Normal appearance of stomach. Vascular/Lymphatic: Abdominal aorta measures up to 2.9 cm. Diffuse atherosclerotic calcifications involving the abdominal aorta. No significant lymph node enlargement in the abdomen or pelvis. Iliac arteries are calcified. Reproductive: Status post hysterectomy. No adnexal masses. Again noted are calcifications in the bilateral adnexal regions. Other: Negative for free fluid. Negative for free air. Streak artifact in the abdomen due to an external metallic structure lateral to the right side of the abdomen. Musculoskeletal: No acute bone abnormality. IMPRESSION: 1. No acute abnormality in the chest, abdomen or pelvis. Limited examination due to motion artifact and streak artifact. 2. Indeterminate splenic lesion is again noted. Difficult to evaluate for interval change. This area could be better characterized with postcontrast CT or MR. 3.  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 Recommend annual imaging followup by CTA or MRA. This recommendation follows 2010 ACCF/AHA/AATS/ACR/ASA/SCA/SCAI/SIR/STS/SVM Guidelines for the Diagnosis and Management of Patients with Thoracic Aortic Disease. Circulation. 2010; 121: Z610-R604. Aortic aneurysm NOS (ICD10-I71.9) 4. Colonic diverticulosis without acute inflammation. 5. Aortic Atherosclerosis (ICD10-I70.0) and Emphysema (ICD10-J43.9). Electronically Signed   By: Richarda Overlie M.D.   On: 05/08/2023 13:36   CT HEAD WO CONTRAST ( ) Result Date: 05/08/2023 CLINICAL DATA:  Neuro deficit, acute, stroke suspected. Weight loss and weakness. EXAM: CT HEAD WITHOUT CONTRAST TECHNIQUE: Contiguous axial images were obtained from the base of the skull through the vertex without intravenous contrast. RADIATION DOSE REDUCTION: This exam was performed according to the departmental dose-optimization program which includes automated exposure control, adjustment of the mA and/or kV according to patient size and/or use of iterative reconstruction technique. COMPARISON:  CT head without contrast 07/27/2015. FINDINGS: Brain: No acute infarct, hemorrhage, or mass lesion is present. Progressive atrophy and white matter disease is moderately advanced for age. No acute infarct, hemorrhage, or mass lesion is present. The ventricles are proportionate to the degree of atrophy. No significant extraaxial fluid collection is present. The brainstem and cerebellum are within normal limits. Midline structures are within normal limits. Vascular: Atherosclerotic calcifications are present within the cavernous internal carotid arteries bilaterally. No hyperdense vessel is present. Skull: Calvarium is intact. No focal lytic or blastic lesions are present. No significant extracranial soft tissue lesion is present. Sinuses/Orbits: The paranasal sinuses and mastoid air cells are clear. Bilateral lens replacements are noted.  Globes and orbits are otherwise unremarkable. IMPRESSION: 1. No acute intracranial abnormality or significant interval change. 2. Progressive atrophy and white matter disease is moderately advanced for age. This likely reflects the sequela of chronic microvascular ischemia. Electronically Signed   By: Marin Roberts M.D.   On: 05/08/2023 10:49        Scheduled Meds:  allopurinol  100 mg Oral QPM   aspirin EC  81 mg Oral Daily   clopidogrel  75 mg Oral Daily   cycloSPORINE  1 drop Both Eyes BID   docusate sodium  100 mg Oral BID   feeding supplement  237 mL Oral TID BM   ferrous sulfate  325 mg Oral Daily   itraconazole  200 mg Oral Q24H   metoprolol tartrate  12.5 mg Oral BID   multivitamin with minerals  1 tablet Oral Daily   Continuous Infusions:  heparin 650 Units/hr (05/09/23 0933)   thiamine (VITAMIN B1) injection 500 mg (05/09/23 1525)   Followed by   [  START ON 05/10/2023] thiamine (VITAMIN B1) injection       LOS: 5 days    Time spent: over 30 min    Lacretia Nicks, MD [facility]   To contact the attending provider between 7A-7P or the covering provider during after hours 7P-7A, please log into the web site [email or web address] and access using universal [place] password for that web site. If you do not have the password, please call the hospital operator.  05/09/2023, 5:24 PM

## 2023-05-09 NOTE — Progress Notes (Addendum)
Pharmacy Antibiotic Note  Holly Guerra is a 82 y.o. female admitted on 05/03/2023 with  esophageal candidiasis .  Pharmacy has been consulted for itraconazole dosing. Avoiding fluconazole due to drug interaction with clopidogrel.   Plan: Itraconazole 200 mg PO daily Monitor renal function Monitor clinical progression F/u LOT  Height: 5\' 2"  (157.5 cm) Weight: 39.9 kg (88 lb) IBW/kg (Calculated) : 50.1  Temp (24hrs), Avg:97.7 F (36.5 C), Min:97.4 F (36.3 C), Max:98 F (36.7 C)  Recent Labs  Lab 05/03/23 1557 05/04/23 0354 05/05/23 0420 05/06/23 0409 05/07/23 0506 05/08/23 0326 05/09/23 0314  WBC 8.5 9.3  --   --  12.1* 10.3 8.7  CREATININE 1.03* 1.29* 1.34* 1.31* 1.32* 1.12* 1.14*    Estimated Creatinine Clearance: 24.4 mL/min (A) (by C-G formula based on SCr of 1.14 mg/dL (H)).    Allergies  Allergen Reactions   Lactose Intolerance (Gi) Diarrhea    Antimicrobials this admission: Itraconazole 12/29 >>  Thank you for allowing pharmacy to be a part of this patients care.  Enos Fling, PharmD PGY-1 Acute Care Pharmacy Resident 05/09/2023 1:20 PM

## 2023-05-09 NOTE — Progress Notes (Signed)
PHARMACY - ANTICOAGULATION CONSULT NOTE  Pharmacy Consult for heparin Indication: atrial fibrillation  Allergies  Allergen Reactions   Lactose Intolerance (Gi) Diarrhea    Patient Measurements: Height: 5\' 2"  (157.5 cm) Weight: 39.9 kg (88 lb) IBW/kg (Calculated) : 50.1 Heparin Dosing Weight: TBW  Vital Signs: Temp: 97.7 F (36.5 C) (12/29 1647) Temp Source: Oral (12/29 1647) BP: 120/68 (12/29 1647) Pulse Rate: 63 (12/29 1647)  Labs: Recent Labs    05/07/23 0506 05/08/23 0326 05/08/23 1643 05/09/23 0314 05/09/23 0849 05/09/23 1645  HGB 10.5* 9.5*  --  8.7* 8.7*  --   HCT 34.0* 30.9*  --  28.0* 28.2*  --   PLT 316 333  --  310  --   --   HEPARINUNFRC  --   --  0.21* 0.25*  --  0.25*  CREATININE 1.32* 1.12*  --  1.14*  --   --     Estimated Creatinine Clearance: 24.4 mL/min (A) (by C-G formula based on SCr of 1.14 mg/dL (H)).   Medical History: Past Medical History:  Diagnosis Date   Abdominal bruit    Abnormal echocardiogram    Anemia    Aortic insufficiency    Aortic root dilation (HCC)    Aortic valve disorder    Arthritis    "all over" (07/22/2012)   Arthropathy    Bilateral carotid artery stenosis    Carotid artery disease (HCC)    Chest discomfort    Edema    Exertional shortness of breath    Fatigue    GERD (gastroesophageal reflux disease)    Gout    Hypercholesteremia    Hypertension    Mitral regurgitation and aortic stenosis    Mixed hyperlipidemia    Murmur, cardiac    Peripheral vascular disease (HCC)    Pernicious anemia    PVD (peripheral vascular disease) (HCC)    Shortness of breath    Sickle-cell trait (HCC)    Thoracic aortic aneurysm (HCC)    Tobacco use    URI (upper respiratory infection)       Assessment: 25 YOF with new onset atrial fibrillation and new onset CHF. CHADsVASc score of 5 (age, sex, HTN, HF). Pharmacy consulted for heparin initiation. Patient started on heparin 5000 units subcutaneously every 8 hours this  admission for VTE prophylaxis, last dose given at 0553 on 12/28. Noted patient is on aspirin and clopidogrel, for peripheral vascular disease. Patient also has a history of epistaxis requiring visits to the ED.  No anticoagulation prior to admission. Note low weight. Pharmacy consulted for heparin.    Heparin level 0.25 is subtherapeutic on 650 units/hr.  No issues with infusion or bleeding per RN.  Goal of Therapy:  Heparin level 0.3-0.7 units/ml Monitor platelets by anticoagulation protocol: Yes   Plan:  Increase heparin infusion to 750 units/hr  Monitor daily heparin level, CBC, signs/symptoms of bleeding  F/u long-term anticoagulation plan  Alphia Moh, PharmD, BCPS, [facility] Clinical Pharmacist  Please check AMION for all [facility] Pharmacy phone numbers After 10:00 PM, call Main Pharmacy [phone]

## 2023-05-09 NOTE — Consult Note (Signed)
Consultation  Referring Provider:     Dr. Lacretia Nicks Primary Care Physician:  Coralee Rud, PA-C Primary Gastroenterologist:        N/A Reason for Consultation:     Odynophagia/mouth pain, weight loss         HPI:   Holly Guerra is a 82 y.o. female with a history of aortic insufficiency, peripheral artery disease and pernicious anemia who was admitted to the hospital December 23 with symptoms of progressive dyspnea, lower extremity swelling and cough.  She was hypoxic with an elevated BNP and positive testing for influenza A.  She was found to have new onset systolic heart failure, with EF 45-50%.  Regional wall motion abnormalities were noted on echocardiogram, which will be worked up further by cardiology as an outpatient.  She also was noted to be in atrial fibrillation with RVR yesterday and is being treated with heparin drip.  Currently back in sinus rhythm.  Hospitalization has been characterized by waxing and waning encephalopathy and dysarthria.  On presentation in the ED, her BNP was elevated 4119.  Hemoglobin 9, MCV 69, platelets 342.  Fecal occult blood negative on admission and again yesterday.  Hemoglobin has been stable throughout hospitalization.  Her baseline hemoglobin seems to be 10-11.  In March of this year, she had a hemoglobinopathy fractionation panel that was consistent with sickle cell trait. CMP on admission with mild aminotransferase elevation, AST 74, ALT 46, normal T. bili/alk phos.  Serum creatinine 1.03. Aminotransferases have improved and are downtrending, with AST 43, ALT 33 yesterday. In June 2023, she had more significant aminotransferase elevations, with AST 344, ALT 111 and T. bili 2.2, normal alk phos.  Procalcitonin not elevated on admission.  Imaging this admission notable for CT chest, abdomen and pelvis yesterday which did not show any acute abnormality, but which was limited due to to motion artifact.  An indeterminate splenic lesion was noted, was  previously seen on CT in 2023.  Ascending ao aortic aneurysm at 4.2 cm noted, with recommendations for annual follow-up.  She was noted to have colonic diverticulosis without diverticulitis.  CT of the head without contrast yesterday showed progressive atrophy and white matter disease, moderately advanced for age, likely secondary to microvascular ischemia.  No acute findings.  She has apparently had a decline in her functional status with weight loss over the past few months.  During my encounter, the patient was noted to be fairly somnolent and inattentive.  She would drift off to sleep, but then wake up quickly.  She would answer questions, but sometimes would be not related to the question asked.  In addition, it appears that many of her answers were an accurate.  Her son and daughter-in-law were in the room at the time of my examination and contradicted some of the things the patient was saying (she told me she usually weighs 189 pounds, she states that she has been walking around out of bed in the hospital).  Son says she probably usually weighs 130 pounds, and she has not been ambulating today.  Although an accurate history was difficult to obtain from the patient, input from her son and daughter-in-law was helpful.  It does appear that the patient has had quite a decline in functional status in the past month at least.  Her p.o. intake has been quite poor, and she has been complaining of pain in her mouth and throat when swallowing.  She has not had any significant abdominal pain, nausea or vomiting.  It does not seem like food gets stuck when she swallows, and she does not have to regurgitate up stuck food. Her energy level has been down.  It is noted that she seems unstable on her feet, often using the walls for support walking at home.  Since being in the hospital, the patient has had waxing and waning mental status.  Often showing signs of confusion and somnolence.  In addition, her speech has  been garbled and difficult to understand.  The patient has never undergone an upper or lower endoscopy that she or her family were aware of.  No record of these procedures in the EMR.   Past Medical History:  Diagnosis Date   Abdominal bruit    Abnormal echocardiogram    Anemia    Aortic insufficiency    Aortic root dilation (HCC)    Aortic valve disorder    Arthritis    "all over" (07/22/2012)   Arthropathy    Bilateral carotid artery stenosis    Carotid artery disease (HCC)    Chest discomfort    Edema    Exertional shortness of breath    Fatigue    GERD (gastroesophageal reflux disease)    Gout    Hypercholesteremia    Hypertension    Mitral regurgitation and aortic stenosis    Mixed hyperlipidemia    Murmur, cardiac    Peripheral vascular disease (HCC)    Pernicious anemia    PVD (peripheral vascular disease) (HCC)    Shortness of breath    Sickle-cell trait (HCC)    Thoracic aortic aneurysm (HCC)    Tobacco use    URI (upper respiratory infection)     Past Surgical History:  Procedure Laterality Date   ABDOMINAL AORTOGRAM W/LOWER EXTREMITY Bilateral 05/23/2020   Procedure: ABDOMINAL AORTOGRAM W/LOWER EXTREMITY;  Surgeon: Runell Gess, MD;  Location: [facility] INVASIVE [facility];  Service: Cardiovascular;  Laterality: Bilateral;   ABDOMINAL HYSTERECTOMY  1970's   APPENDECTOMY  1970's   CARDIOVASCULAR STRESS TEST  06/16/2012   normal myocardial perfusion imaging, LV systolic function was normal without regional wall motion abnormalities, LV EF 60%   DOPPLER ECHOCARDIOGRAPHY  06/16/2012   EF 60-65%, mild concetric LVH, moderate aortic regurg   LOWER EXTREMITY ANGIOGRAM N/A 07/22/2012   Procedure: LOWER EXTREMITY ANGIOGRAM;  Surgeon: Runell Gess, MD;  Location: [facility] CATH LAB;  Service: Cardiovascular;  Laterality: N/A;   LOWER EXTREMITY ARTERIAL DOPPLER  08/08/2012   Left SFA stent-open and patent without evidence of hemodynamically significant stenosis   PERCUTANEOUS  STENT INTERVENTION Left 07/22/2012   TOTAL THYROIDECTOMY  ~ 2010    Family History  Problem Relation Age of Onset   Hypertension Mother    Heart disease Mother      Social History   Tobacco Use   Smoking status: Every Day    Current packs/day: 0.00    Average packs/day: 0.5 packs/day for 45.0 years (22.5 ttl pk-yrs)    Types: Cigarettes    Start date: 05/11/1962    Last attempt to quit: 05/12/2007    Years since quitting: 16.0   Smokeless tobacco: Never   Tobacco comments:    Pt states she smokes "6-7 a day"  Vaping Use   Vaping status: Never Used  Substance Use Topics   Alcohol use: Yes    Comment: "3 beers a week"   Drug use: No    Prior to Admission medications   Medication Sig Start Date End Date Taking? Authorizing Provider  aspirin EC 81 MG tablet Take 1 tablet (81 mg total) by mouth daily. 07/24/12  Yes Robbie Lis M, PA-C  atorvastatin (LIPITOR) 80 MG tablet Take 80 mg by mouth daily. 03/06/20  Yes [provider]  cilostazol (PLETAL) 100 MG tablet Take 100 mg by mouth 2 (two) times daily.   Yes [provider]  clopidogrel (PLAVIX) 75 MG tablet Take 1 tablet (75 mg total) by mouth daily. 07/24/12  Yes Robbie Lis M, PA-C  cyanocobalamin (,VITAMIN B-12,) 1000 MCG/ML injection Inject 1,000 mg into the muscle every 30 (thirty) days. 03/08/20  Yes [provider]  cycloSPORINE (RESTASIS) 0.05 % ophthalmic emulsion Place 1 drop into both eyes 2 (two) times daily.   Yes [provider]  Iron-Vitamins (GERITOL) LIQD Take 15 mLs by mouth daily.   Yes [provider]  omeprazole (PRILOSEC) 40 MG capsule Take 40 mg by mouth 2 (two) times daily.   Yes [provider]  sodium chloride (OCEAN) 0.65 % SOLN nasal spray Place 1 spray into both nostrils as needed for congestion. 11/23/22  Yes Ashok Croon, MD  traMADol (ULTRAM) 50 MG tablet Take 50 mg by mouth every 8 (eight) hours as needed (pain). 07/25/15  Yes  [provider]    Current Facility-Administered Medications  Medication Dose Route Frequency Provider Last Rate Last Admin   acetaminophen (TYLENOL) tablet 650 mg  650 mg Oral Q6H PRN Janalyn Shy, Subrina, MD   650 mg at 05/04/23 1719   Or   acetaminophen (TYLENOL) suppository 650 mg  650 mg Rectal Q6H PRN Janalyn Shy, Subrina, MD       albuterol (PROVENTIL) (2.5 MG/3ML) 0.083% nebulizer solution 2.5 mg  2.5 mg Inhalation Q2H PRN Janalyn Shy, Subrina, MD       allopurinol (ZYLOPRIM) tablet 100 mg  100 mg Oral QPM Sundil, Subrina, MD   100 mg at 05/08/23 1750   alum & mag hydroxide-simeth (MAALOX/MYLANTA) 200-200-20 MG/5ML suspension 30 mL  30 mL Oral Once Zigmund Daniel., MD       aspirin EC tablet 81 mg  81 mg Oral Daily Sundil, Subrina, MD   81 mg at 05/09/23 0935   atorvastatin (LIPITOR) tablet 80 mg  80 mg Oral QPM Sundil, Subrina, MD   80 mg at 05/08/23 1750   clopidogrel (PLAVIX) tablet 75 mg  75 mg Oral Daily Sundil, Subrina, MD   75 mg at 05/09/23 0935   cyclobenzaprine (FLEXERIL) tablet 10 mg  10 mg Oral QHS PRN Sundil, Subrina, MD   10 mg at 05/08/23 2135   cycloSPORINE (RESTASIS) 0.05 % ophthalmic emulsion 1 drop  1 drop Both Eyes BID Coralie Keens, MD   1 drop at 05/09/23 0954   docusate sodium (COLACE) capsule 100 mg  100 mg Oral BID Sundil, Subrina, MD   100 mg at 05/09/23 0935   feeding supplement (ENSURE ENLIVE / ENSURE PLUS) liquid 237 mL  237 mL Oral TID BM Zigmund Daniel., MD   237 mL at 05/09/23 0939   ferrous sulfate tablet 325 mg  325 mg Oral Daily Sundil, Subrina, MD   325 mg at 05/09/23 0935   guaiFENesin-dextromethorphan (ROBITUSSIN DM) 100-10 MG/5ML syrup 5 mL  5 mL Oral Q4H PRN Coralie Keens, MD   5 mL at 05/08/23 2134   heparin ADULT infusion 100 units/mL (25000 units/231mL)  650 Units/hr Intravenous Continuous Arma Heading, RPH 6.5 mL/hr at 05/09/23 0933 650 Units/hr at 05/09/23 0933   lidocaine (XYLOCAINE) 2 %  viscous mouth  solution 15 mL  15 mL Mouth/Throat Q6H PRN Zigmund Daniel., MD       metoprolol tartrate (LOPRESSOR) tablet 12.5 mg  12.5 mg Oral BID Zigmund Daniel., MD   12.5 mg at 05/09/23 0940   multivitamin with minerals tablet 1 tablet  1 tablet Oral Daily Zigmund Daniel., MD   1 tablet at 05/09/23 0935   ondansetron [facility]) tablet 4 mg  4 mg Oral Q6H PRN Janalyn Shy Subrina, MD       Or   ondansetron [facility]) injection 4 mg  4 mg Intravenous Q6H PRN Tereasa Coop, MD       Oral care mouth rinse  15 mL Mouth Rinse PRN Janalyn Shy, Subrina, MD       pantoprazole (PROTONIX) EC tablet 40 mg  40 mg Oral Daily PRN Janalyn Shy, Subrina, MD       phenol (CHLORASEPTIC) mouth spray 1 spray  1 spray Mouth/Throat PRN Zigmund Daniel., MD   1 spray at 05/06/23 1959   sucralfate (CARAFATE) tablet 1 g  1 g Oral QID PRN Janalyn Shy, Subrina, MD       thiamine (VITAMIN B1) 500 mg in sodium chloride 0.9 % 50 mL IVPB  500 mg Intravenous Q8H Zigmund Daniel., MD 110 mL/hr at 05/09/23 0554 500 mg at 05/09/23 0554   Followed by   Melene Muller ON 05/10/2023] thiamine (VITAMIN B1) 250 mg in sodium chloride 0.9 % 50 mL IVPB  250 mg Intravenous Daily Zigmund Daniel., MD       traMADol Janean Sark) tablet 50 mg  50 mg Oral Q12H PRN Janalyn Shy, Subrina, MD   50 mg at 05/08/23 2136    Allergies as of 05/03/2023 - Review Complete 05/03/2023  Allergen Reaction Noted   Lactose intolerance (gi) Diarrhea 07/22/2012     Review of Systems:    As per HPI, otherwise negative    Physical Exam:  Vital signs in last 24 hours: Temp:  [97.4 F (36.3 C)-98 F (36.7 C)] 97.4 F (36.3 C) (12/29 0818) Pulse Rate:  [63-69] 63 (12/29 0818) Resp:  [16-17] 16 (12/29 0411) BP: (98-117)/(47-67) 103/63 (12/29 0818) SpO2:  [92 %-100 %] 95 % (12/29 0818) Weight:  [39.9 kg] 39.9 kg (12/29 0411) Last BM Date : 05/08/23 General:   Pleasant thin African-American female in NAD, lying in bed.  Accompanied by son and daughter-in-law at  bedside Head:  Normocephalic and atraumatic. Eyes:   No icterus.   Conjunctiva pink.  Exotropia Ears:  Normal auditory acuity. Oropharynx: Tongue notable for dark grayish adherent exudate involving the posterior tongue and right lateral tongue.  Grayish mucus present in oropharynx.  No gingival or buccal ulcers Neck:  Supple Lungs: Respiratory effort mildly increased.  Diminished lung sounds bilaterally with faint wheezing.    Heart:  Regular rate and rhythm; high-pitched murmur, 3/6, best heard left upper sternal border Abdomen:  Soft, nondistended, nontender. Normal bowel sounds. No appreciable masses or hepatomegaly.  Rectal:  Not performed.  Msk:  Symmetrical without gross deformities.  Extremities:  Without edema.  Sarcopenia Neurologic: Inattentive but awake.  Patient did nod off multiple times during our encounter;  grossly normal neurologically.  Inaccurate recalls, and occasional inappropriate responses to questions (when asking about her swallowing, patient repeatedly wanted to talk about her shortness of breath) Skin:  Intact without significant lesions or rashes. Psych:  Alert and cooperative. Normal affect.  LAB RESULTS: Recent Labs    05/07/23 0506  05/08/23 0326 05/09/23 0314 05/09/23 0849  WBC 12.1* 10.3 8.7  --   HGB 10.5* 9.5* 8.7* 8.7*  HCT 34.0* 30.9* 28.0* 28.2*  PLT 316 333 310  --    BMET Recent Labs    05/07/23 0506 05/08/23 0326 05/09/23 0314  NA 143 146* 136  K 3.9 3.3* 4.0  CL 107 108 102  CO2 25 26 25   GLUCOSE 91 79 131*  BUN 21 16 14   CREATININE 1.32* 1.12* 1.14*  CALCIUM 9.5 9.3 9.0   LFT Recent Labs    05/08/23 0326  PROT 7.0  ALBUMIN 2.9*  AST 43*  ALT 33  ALKPHOS 56  BILITOT 1.1   PT/INR No results for input(s): "LABPROT", "INR" in the last 72 hours.  STUDIES: CT CHEST ABDOMEN PELVIS WO CONTRAST Result Date: 05/08/2023 CLINICAL DATA:  Weight loss, unintended weight loss EXAM: CT CHEST, ABDOMEN AND PELVIS WITHOUT CONTRAST  TECHNIQUE: Multidetector CT imaging of the chest, abdomen and pelvis was performed following the standard protocol without IV contrast. RADIATION DOSE REDUCTION: This exam was performed according to the departmental dose-optimization program which includes automated exposure control, adjustment of the mA and/or kV according to patient size and/or use of iterative reconstruction technique. COMPARISON:  CT abdomen pelvis 10/23/2019 FINDINGS: CT CHEST FINDINGS Cardiovascular: Ascending thoracic aorta measures 4.2 cm. Proximal descending thoracic aorta measures 3.4 cm. Distal descending thoracic aorta measures 3.0 cm. Diffuse atherosclerotic calcifications in the thoracic aorta. Coronary artery calcifications. Mildly enlarged heart without pericardial effusion. 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 Left thyroid tissue is unremarkable. No significant lymph node enlargement in the chest but the limited evaluation without intravascular contrast. There is some oral contrast in the esophagus. No axillary lymph node enlargement. Lungs/Pleura: Centrilobular emphysema. Motion artifact limits evaluation of the lungs but no significant airspace disease or lung consolidation. No large pleural effusions. No suspicious lung lesions. Musculoskeletal: Old fractures of left ninth and tenth ribs. No acute bone abnormality. CT ABDOMEN PELVIS FINDINGS Hepatobiliary: Normal appearance of the liver and gallbladder. Pancreas: Unremarkable. No pancreatic ductal dilatation or surrounding inflammatory changes. Spleen: Again noted is a fullness along the medial aspect of the spleen and there is concern for lesion in this area based on the previous contrast study from 2023. This area roughly measures 3.3 x 2.6 cm and previously measured 3.1 x 2.5 cm. Difficult to evaluate for interval change. Again noted are areas of cortical scarring in the right kidney. Limited evaluation due to motion  artifact and streak artifact in the abdomen. Negative for hydronephrosis. Probable areas of cortical scarring in the left kidney are similar to the previous examination. Negative for kidney stones. Moderate amount of fluid in the urinary bladder. No bladder stones. Stomach/Bowel: Colonic diverticulosis without acute inflammation. No bowel dilatation or obstruction. Normal appearance of stomach. Vascular/Lymphatic: Abdominal aorta measures up to 2.9 cm. Diffuse atherosclerotic calcifications involving the abdominal aorta. No significant lymph node enlargement in the abdomen or pelvis. Iliac arteries are calcified. Reproductive: Status post hysterectomy. No adnexal masses. Again noted are calcifications in the bilateral adnexal regions. Other: Negative for free fluid. Negative for free air. Streak artifact in the abdomen due to an external metallic structure lateral to the right side of the abdomen. Musculoskeletal: No acute bone abnormality. IMPRESSION: 1. No acute abnormality in the chest, abdomen or pelvis. Limited examination due to motion artifact and streak artifact. 2. Indeterminate splenic lesion is again noted. Difficult to evaluate for interval change. This  area could be better characterized with postcontrast CT or MR. 3. 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 Recommend annual imaging followup by CTA or MRA. This recommendation follows 2010 ACCF/AHA/AATS/ACR/ASA/SCA/SCAI/SIR/STS/SVM Guidelines for the Diagnosis and Management of Patients with Thoracic Aortic Disease. Circulation. 2010; 121: O130-Q657. Aortic aneurysm NOS (ICD10-I71.9) 4. Colonic diverticulosis without acute inflammation. 5. Aortic Atherosclerosis (ICD10-I70.0) and Emphysema (ICD10-J43.9). Electronically Signed   By: Richarda Overlie M.D.   On: 05/08/2023 13:36   CT HEAD WO CONTRAST ( ) Result Date: 05/08/2023 CLINICAL DATA:  Neuro deficit, acute, stroke suspected. Weight loss and weakness. EXAM: CT HEAD WITHOUT  CONTRAST TECHNIQUE: Contiguous axial images were obtained from the base of the skull through the vertex without intravenous contrast. RADIATION DOSE REDUCTION: This exam was performed according to the departmental dose-optimization program which includes automated exposure control, adjustment of the mA and/or kV according to patient size and/or use of iterative reconstruction technique. COMPARISON:  CT head without contrast 07/27/2015. FINDINGS: Brain: No acute infarct, hemorrhage, or mass lesion is present. Progressive atrophy and white matter disease is moderately advanced for age. No acute infarct, hemorrhage, or mass lesion is present. The ventricles are proportionate to the degree of atrophy. No significant extraaxial fluid collection is present. The brainstem and cerebellum are within normal limits. Midline structures are within normal limits. Vascular: Atherosclerotic calcifications are present within the cavernous internal carotid arteries bilaterally. No hyperdense vessel is present. Skull: Calvarium is intact. No focal lytic or blastic lesions are present. No significant extracranial soft tissue lesion is present. Sinuses/Orbits: The paranasal sinuses and mastoid air cells are clear. Bilateral lens replacements are noted. Globes and orbits are otherwise unremarkable. IMPRESSION: 1. No acute intracranial abnormality or significant interval change. 2. Progressive atrophy and white matter disease is moderately advanced for age. This likely reflects the sequela of chronic microvascular ischemia. Electronically Signed   By: Marin Roberts M.D.   On: 05/08/2023 10:49     PREVIOUS ENDOSCOPIES:            N/A   Impression / Plan:   82 year old female with history of aortic insufficiency, reported pernicious anemia, sickle cell trait, and peripheral artery disease, admitted with progressive shortness of breath, found to have new systolic heart failure in the setting of influenza, also with  significant decline in functional status over the past month, manifested as poor p.o. intake, decreased energy and instability, and with more significant changes since in the hospital characterized by dysarthria and a waxing and waning mental status. CT of the head notable for moderately advanced cerebral atrophy secondary to microvascular ischemia.  CT chest abdomen and pelvis negative for any malignancy. She does not have any clear symptoms of esophageal dysphagia or any other concerning GI symptoms that may be associated with her weight loss such as early satiety, nausea/vomiting or abdominal pain. She does have glossitis of unclear etiology which likely explains her pain with eating.  The appearance of her tongue was not classic for candidiasis, but I agree with empiric treatment with fluconazole. She has a reported history of pernicious anemia, but I cannot find any objective evidence of any antibodies to support that diagnosis and she has never undergone an upper endoscopy. I would recommend checking B12 levels, which can cause glossitis and neurologic symptoms such as gait instability.  We did discuss the role of endoscopy to evaluate unintentional weight loss and poor p.o. intake.  Although an upper endoscopy is certainly reasonable to consider, given the patient's frail status,  recent heart failure and underlying lung disease, the procedure may pose more risk than benefit at the moment.  The patient's family was in agreement with this, and did not want to pursue any invasive workup at this time. I did offer a barium esophagram as a noninvasive way to rule out an esophageal mass, significant stricture or inflammation.  The patient's family would like to proceed with this.  Poor p.o. intake/weight loss - Barium esophagram - Deferring endoscopy for now; can reassess risk/benefits later - Hopefully p.o. intake will improve if glossitis improves with fluconazole  Glossitis - B12 levels - Agree  with fluconazole - Continue topical medications (Chloraseptic, viscous lidocaine, oral care)  New systolic/diastolic heart failure (EF 45-50%), unclear etiology (influenza?) with wall motion abnormality - Further workup per cardiology as outpatient  History of smoking with emphysematous changes on CT - No known diagnosis of COPD, but likely based on smoking history and CT findings as well as physical exam - Continue albuterol as needed, consider PFTs as outpatient  Influenza -On oseltamivir  Encephalopathy - May be secondary to multiple underlying insults (heart failure, influenza, chronic small vessel cerebrovascular disease) - B12 level as above to exclude B12 deficiency  GI will continue to follow with you Dr. Adela Lank will be taking over inpatient GI coverage tomorrow  Thanks   LOS: 5 days   Jenel Lucks  05/09/2023, 1:47 PM

## 2023-05-09 NOTE — Progress Notes (Signed)
Mobility Specialist Progress Note:   05/09/23 1450  Mobility  Activity Ambulated with assistance in hallway  Level of Assistance Minimal assist, patient does 75% or more  Assistive Device Front wheel walker  Distance Ambulated (ft) 180 ft  Activity Response Tolerated well  Mobility Referral Yes  Mobility visit 1 Mobility  Mobility Specialist Start Time (ACUTE ONLY) 1450  Mobility Specialist Stop Time (ACUTE ONLY) 1510  Mobility Specialist Time Calculation (min) (ACUTE ONLY) 20 min   Pt agreeable to mobility session. Required minA intermittently to steady during ambulation. Pt c/o R medial foot aching, otherwise asx. Pt with slow gait throughout. Left sitting EOB with all needs met, RN and family present.   Addison Lank Mobility Specialist Please contact via SecureChat or  Rehab office at [phone]

## 2023-05-09 NOTE — Progress Notes (Addendum)
PHARMACY - ANTICOAGULATION CONSULT NOTE  Pharmacy Consult for heparin Indication: atrial fibrillation  Allergies  Allergen Reactions   Lactose Intolerance (Gi) Diarrhea    Patient Measurements: Height: 5\' 2"  (157.5 cm) Weight: 39.9 kg (88 lb) IBW/kg (Calculated) : 50.1 Heparin Dosing Weight: TBW  Vital Signs: Temp: 97.4 F (36.3 C) (12/29 0411) Temp Source: Oral (12/29 0411) BP: 112/67 (12/29 0411) Pulse Rate: 63 (12/29 0411)  Labs: Recent Labs    05/07/23 0506 05/08/23 0326 05/08/23 1643 05/09/23 0314  HGB 10.5* 9.5*  --  8.7*  HCT 34.0* 30.9*  --  28.0*  PLT 316 333  --  310  HEPARINUNFRC  --   --  0.21* 0.25*  CREATININE 1.32* 1.12*  --   --     Estimated Creatinine Clearance: 24.8 mL/min (A) (by C-G formula based on SCr of 1.12 mg/dL (H)).   Medical History: Past Medical History:  Diagnosis Date   Abdominal bruit    Abnormal echocardiogram    Anemia    Aortic insufficiency    Aortic root dilation (HCC)    Aortic valve disorder    Arthritis    "all over" (07/22/2012)   Arthropathy    Bilateral carotid artery stenosis    Carotid artery disease (HCC)    Chest discomfort    Edema    Exertional shortness of breath    Fatigue    GERD (gastroesophageal reflux disease)    Gout    Hypercholesteremia    Hypertension    Mitral regurgitation and aortic stenosis    Mixed hyperlipidemia    Murmur, cardiac    Peripheral vascular disease (HCC)    Pernicious anemia    PVD (peripheral vascular disease) (HCC)    Shortness of breath    Sickle-cell trait (HCC)    Thoracic aortic aneurysm (HCC)    Tobacco use    URI (upper respiratory infection)       Assessment: 37 YOF with new onset atrial fibrillation and new onset CHF. CHADsVASc score of 5 (age, sex, HTN, HF). Pharmacy consulted for heparin initiation. Patient started on heparin 5000 units subcutaneously every 8 hours this admission for VTE prophylaxis, last dose given at 0553 on 12/28. Noted patient is  on aspirin and clopidogrel, for peripheral vascular disease. Patient also has a history of epistaxis requiring visits to the ED.  No anticoagulation prior to admission. Note low weight. Pharmacy consulted for heparin.    12/29 AM: Heparin level 0.25, subtherapeutic on 600 units/hr. No issues with infusion or bleeding per RN. Hgb decreasing down to 8.7 today, PLT stable at 310.   Goal of Therapy:  Heparin level 0.3-0.7 units/ml Monitor platelets by anticoagulation protocol: Yes   Plan:  Increase heparin infusion to 650 units/hr  Recheck heparin level in 8 hours Monitor daily heparin level, CBC, signs/symptoms of bleeding  F/u long-term anticoagulation plan  Enos Fling, PharmD PGY-1 Acute Care Pharmacy Resident 05/09/2023 7:43 AM  Please check AMION for all [facility] Pharmacy phone numbers After 10:00 PM, call Main Pharmacy [phone]

## 2023-05-09 NOTE — Plan of Care (Signed)
  Problem: Activity: Goal: Risk for activity intolerance will decrease Outcome: Progressing   Problem: Nutrition: Goal: Adequate nutrition will be maintained Outcome: Progressing   Problem: Pain Management: Goal: General experience of comfort will improve Outcome: Progressing   Problem: Safety: Goal: Ability to remain free from injury will improve Outcome: Progressing

## 2023-05-10 ENCOUNTER — Inpatient Hospital Stay (HOSPITAL_COMMUNITY): Payer: Medicare Other

## 2023-05-10 ENCOUNTER — Ambulatory Visit (HOSPITAL_COMMUNITY): Admission: RE | Admit: 2023-05-10 | Payer: Medicare Other | Source: Ambulatory Visit

## 2023-05-10 DIAGNOSIS — I509 Heart failure, unspecified: Secondary | ICD-10-CM | POA: Diagnosis not present

## 2023-05-10 DIAGNOSIS — R634 Abnormal weight loss: Secondary | ICD-10-CM | POA: Diagnosis not present

## 2023-05-10 DIAGNOSIS — Z515 Encounter for palliative care: Secondary | ICD-10-CM | POA: Diagnosis not present

## 2023-05-10 DIAGNOSIS — Z7189 Other specified counseling: Secondary | ICD-10-CM | POA: Diagnosis not present

## 2023-05-10 DIAGNOSIS — R627 Adult failure to thrive: Secondary | ICD-10-CM

## 2023-05-10 DIAGNOSIS — Z7901 Long term (current) use of anticoagulants: Secondary | ICD-10-CM

## 2023-05-10 DIAGNOSIS — K222 Esophageal obstruction: Secondary | ICD-10-CM | POA: Diagnosis not present

## 2023-05-10 DIAGNOSIS — E43 Unspecified severe protein-calorie malnutrition: Secondary | ICD-10-CM | POA: Insufficient documentation

## 2023-05-10 DIAGNOSIS — R933 Abnormal findings on diagnostic imaging of other parts of digestive tract: Secondary | ICD-10-CM

## 2023-05-10 DIAGNOSIS — Z7902 Long term (current) use of antithrombotics/antiplatelets: Secondary | ICD-10-CM

## 2023-05-10 DIAGNOSIS — R1310 Dysphagia, unspecified: Secondary | ICD-10-CM

## 2023-05-10 LAB — COMPREHENSIVE METABOLIC PANEL
ALT: 34 U/L (ref 0–44)
AST: 38 U/L (ref 15–41)
Albumin: 2.7 g/dL — ABNORMAL LOW (ref 3.5–5.0)
Alkaline Phosphatase: 56 U/L (ref 38–126)
Anion gap: 10 (ref 5–15)
BUN: 11 mg/dL (ref 8–23)
CO2: 24 mmol/L (ref 22–32)
Calcium: 9.1 mg/dL (ref 8.9–10.3)
Chloride: 101 mmol/L (ref 98–111)
Creatinine, Ser: 1.14 mg/dL — ABNORMAL HIGH (ref 0.44–1.00)
GFR, Estimated: 48 mL/min — ABNORMAL LOW (ref >60)
Glucose, Bld: 94 mg/dL (ref 70–99)
Potassium: 4.2 mmol/L (ref 3.5–5.1)
Sodium: 135 mmol/L (ref 135–145)
Total Bilirubin: 0.6 mg/dL (ref <1.2)
Total Protein: 6.7 g/dL (ref 6.5–8.1)

## 2023-05-10 LAB — CBC WITH DIFFERENTIAL/PLATELET
Abs Immature Granulocytes: 0.03 10*3/uL (ref 0.00–0.07)
Basophils Absolute: 0.1 10*3/uL (ref 0.0–0.1)
Basophils Relative: 1 %
Eosinophils Absolute: 0.2 10*3/uL (ref 0.0–0.5)
Eosinophils Relative: 2 %
HCT: 28.5 % — ABNORMAL LOW (ref 36.0–46.0)
Hemoglobin: 8.7 g/dL — ABNORMAL LOW (ref 12.0–15.0)
Immature Granulocytes: 1 %
Lymphocytes Relative: 17 %
Lymphs Abs: 1 10*3/uL (ref 0.7–4.0)
MCH: 20.6 pg — ABNORMAL LOW (ref 26.0–34.0)
MCHC: 30.5 g/dL (ref 30.0–36.0)
MCV: 67.4 fL — ABNORMAL LOW (ref 80.0–100.0)
Monocytes Absolute: 0.8 10*3/uL (ref 0.1–1.0)
Monocytes Relative: 14 %
Neutro Abs: 4.1 10*3/uL (ref 1.7–7.7)
Neutrophils Relative %: 65 %
Platelets: 310 10*3/uL (ref 150–400)
RBC: 4.23 MIL/uL (ref 3.87–5.11)
RDW: 19.9 % — ABNORMAL HIGH (ref 11.5–15.5)
WBC: 6.2 10*3/uL (ref 4.0–10.5)
nRBC: 0.3 % — ABNORMAL HIGH (ref 0.0–0.2)

## 2023-05-10 LAB — VITAMIN B12: Vitamin B-12: 3174 pg/mL — ABNORMAL HIGH (ref 180–914)

## 2023-05-10 LAB — MAGNESIUM: Magnesium: 1.8 mg/dL (ref 1.7–2.4)

## 2023-05-10 LAB — PHOSPHORUS: Phosphorus: 3 mg/dL (ref 2.5–4.6)

## 2023-05-10 LAB — HEPARIN LEVEL (UNFRACTIONATED): Heparin Unfractionated: 0.35 [IU]/mL (ref 0.30–0.70)

## 2023-05-10 MED ORDER — QUETIAPINE FUMARATE 25 MG PO TABS: 12.5000 mg | ORAL_TABLET | Freq: Every evening | ORAL | Status: DC | PRN

## 2023-05-10 MED ORDER — MAGIC MOUTHWASH W/LIDOCAINE: 15.0000 mL | Freq: Four times a day (QID) | ORAL | Status: DC | PRN

## 2023-05-10 MED ORDER — QUETIAPINE FUMARATE 25 MG PO TABS: 12.5000 mg | ORAL_TABLET | Freq: Every day | ORAL | Status: DC

## 2023-05-10 MED ORDER — TRAZODONE HCL 50 MG PO TABS
50.0000 mg | ORAL_TABLET | Freq: Every day | ORAL | Status: DC
  Administered 2023-05-10 – 2023-05-18 (×8): 50 mg via ORAL
  Filled 2023-05-10 (×9): qty 1

## 2023-05-10 MED ORDER — THIAMINE HCL 100 MG/ML IJ SOLN
250.0000 mg | Freq: Every day | INTRAVENOUS | Status: AC
  Administered 2023-05-11 – 2023-05-15 (×4): 250 mg via INTRAVENOUS
  Filled 2023-05-10 (×5): qty 2.5

## 2023-05-10 MED ORDER — PANTOPRAZOLE SODIUM 40 MG IV SOLR
40.0000 mg | Freq: Two times a day (BID) | INTRAVENOUS | Status: DC
  Administered 2023-05-10 – 2023-05-14 (×8): 40 mg via INTRAVENOUS
  Filled 2023-05-10 (×9): qty 10

## 2023-05-10 MED ORDER — SUCRALFATE 1 GM/10ML PO SUSP
1.0000 g | Freq: Three times a day (TID) | ORAL | Status: DC
  Administered 2023-05-10 – 2023-05-19 (×21): 1 g via ORAL
  Filled 2023-05-10 (×23): qty 10

## 2023-05-10 NOTE — Progress Notes (Signed)
PHARMACY - ANTICOAGULATION CONSULT NOTE  Pharmacy Consult for heparin Indication: atrial fibrillation  Allergies  Allergen Reactions   Lactose Intolerance (Gi) Diarrhea    Patient Measurements: Height: 5\' 2"  (157.5 cm) Weight: 39.7 kg (87 lb 8 oz) IBW/kg (Calculated) : 50.1 Heparin Dosing Weight: TBW  Vital Signs: Temp: 98.1 F (36.7 C) (12/30 0405) Temp Source: Oral (12/30 0405) BP: 117/72 (12/30 0405) Pulse Rate: 60 (12/30 0405)  Labs: Recent Labs    05/08/23 0326 05/08/23 1643 05/09/23 0314 05/09/23 0849 05/09/23 1645 05/10/23 0401  HGB 9.5*  --  8.7* 8.7*  --  8.7*  HCT 30.9*  --  28.0* 28.2*  --  28.5*  PLT 333  --  310  --   --  310  HEPARINUNFRC  --    < > 0.25*  --  0.25* 0.35  CREATININE 1.12*  --  1.14*  --   --  1.14*   < > = values in this interval not displayed.    Estimated Creatinine Clearance: 24.3 mL/min (A) (by C-G formula based on SCr of 1.14 mg/dL (H)).   Medical History: Past Medical History:  Diagnosis Date   Abdominal bruit    Abnormal echocardiogram    Anemia    Aortic insufficiency    Aortic root dilation (HCC)    Aortic valve disorder    Arthritis    "all over" (07/22/2012)   Arthropathy    Bilateral carotid artery stenosis    Carotid artery disease (HCC)    Chest discomfort    Edema    Exertional shortness of breath    Fatigue    GERD (gastroesophageal reflux disease)    Gout    Hypercholesteremia    Hypertension    Mitral regurgitation and aortic stenosis    Mixed hyperlipidemia    Murmur, cardiac    Peripheral vascular disease (HCC)    Pernicious anemia    PVD (peripheral vascular disease) (HCC)    Shortness of breath    Sickle-cell trait (HCC)    Thoracic aortic aneurysm (HCC)    Tobacco use    URI (upper respiratory infection)       Assessment: 35 YOF with new onset atrial fibrillation and new onset CHF. CHADsVASc score of 5 (age, sex, HTN, HF). Pharmacy consulted for heparin initiation. Patient started on  heparin 5000 units subcutaneously every 8 hours this admission for VTE prophylaxis, last dose given at 0553 on 12/28. Noted patient is on aspirin and clopidogrel, for peripheral vascular disease. Patient also has a history of epistaxis requiring visits to the ED.  No anticoagulation prior to admission. Note low weight. Pharmacy consulted for heparin.    Heparin level 0.35-therapeutic on heparin drip 750units/hr.  No issues with infusion or bleeding per RN.  Goal of Therapy:  Heparin level 0.3-0.7 units/ml Monitor platelets by anticoagulation protocol: Yes   Plan:  Continue heparin infusion  750 units/hr  Monitor daily heparin level, CBC, signs/symptoms of bleeding  F/u long-term anticoagulation plan - when able to tolerate pos and all procedures complete   Leota Sauers Pharm.D. CPP, BCPS Clinical Pharmacist [phone] 05/10/2023 11:46 AM    Please check AMION for all [facility] Pharmacy phone numbers After 10:00 PM, call Main Pharmacy [phone]

## 2023-05-10 NOTE — Progress Notes (Signed)
PROGRESS NOTE    Holly Guerra  NGE:[id number] DOB: 1940-07-23 DOA: 05/03/2023 PCP: Coralee Rud, PA-C  Chief Complaint  Patient presents with   Weakness    Brief Narrative:   [name] was admitted to the hospital with the working diagnosis of acute heart failure in the setting of influenza [name] infection.    [name] with history of aortic insufficiency, peripheral vascular disease, GERD, essential hypertension, pernicious anemia presented to the ED yesterday with multiple symptoms, progressive shortness of breath X 1 week, some swelling, cough and orthopnea, in addition also complained of congestion sinus pain and headaches. -In the ED she was noted to be mildly hypoxic, BNP 4119, respiratory virus panel positive for influenza [name], hemoglobin 9.2, creatinine 1.03,    Chest radiograph with cardiomegaly with bilateral hilar vascular congestion and cephalization of the vasculature.    EKG 89 bpm, left axis deviation, left anterior fascicular block, qtc 508, sinus rhythm with poor R R wave progression, no significant ST segment changes and negative T wave V5 and V6.   Assessment & Plan:   Principal Problem:   Acute heart failure (HCC) Active Problems:   Influenza with respiratory manifestation   Essential hypertension   AKI (acute kidney injury) (HCC)   Pernicious anemia   GERD (gastroesophageal reflux disease)   PVD (peripheral vascular disease) (HCC)   Protein-calorie malnutrition, severe   Dysphagia   Abnormal barium swallow   Antiplatelet or antithrombotic long-term use  Goals of Care Appreciate palliative assistance.  Failure to Thrive Weight Loss  Poor PO Intake Odynophagia  Glossitis  Concern for possible candidiasis Today she notes oral pain and pain with swallowing, present for at least 5 weeks or so - she has greyish/brown coating on tongue as well as ulcerations with grey slough.  Not classic appearance for thrush, but treating empirically for oropharyngeal and  esophageal candidiasis with itraconazole with her pain/discomfort (no fluconazole due to plavix). GI consulted, appreciate assistance -> full liquid diet, PPI BID, carafate, itraconazole - hold plavix for possible EGD Narrowing in proximal esophagus, irregular and thickened esophageal mucosa concerning for esophagitis, mild to moderate segmental stricture of the distal esophagus. B12 wnl CT abdomen/pelvis without acute abnormality (limited eval due to motion and streak artifact) RD previously recommended cortrak, daughter [name] wasn't interested with concern she may pull this.  Will avoid for now.  Will see how she does with above measures.  Atrial Fibrillation with RVR Back in sinus, will start metoprolol  Continue heparin gtt for now  Acute Metabolic Encephalopathy Dysarthria  Per discussion with daughter, waxing/waning encephalopathy, dysarthria for the past week Likely due to influenza infection  Will check head CT -> progressive atrophy and white matter disease, advanced for age High dose thiamine  Delirium precautions  Indeterminate Splenic Lesion Consider postcontrast CT vs MR   New onset of congestive heart failure (HCC) Echo with EF 45-50%, regional wall motion abnormalities, grade II diastolic dysfunction, moderate AS  Spironolactone, jardiance Lasix   Influenza [name] with pneumonia No signs of bacterial superinfection.  oseltamivir for antiviral therapy.    Regional Wall Motion Abnormality Plan for outpatient cardiology follow up   AKI (acute kidney injury) (HCC) Creatinine has risen with diuresis, will monitor    Hypokalemia.  Resolved  Essential hypertension amlodipine and nebevilol.    Pernicious anemia Cell count has been stable.  Patient on B12 injections q 30 days.    Iron deficiency anemia, continue with oral iron supplementation.    GERD (gastroesophageal reflux disease)  Continue with pantoprazole.    PVD (peripheral vascular disease) (HCC) Continue  blood pressure control  Patient on aspirin, and clopidogrel (on hold) Hold cilostazol in the setting of heart failure.  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 Papillary muscle hypertrophy. Left ventricular ejection fraction, by  estimation, is 45 to 50%. The left ventricle has mildly decreased  function. The left ventricle demonstrates regional wall motion  abnormalities (see scoring diagram/findings for  description). There is severe concentric left ventricular hypertrophy of  the inferior segment. Left ventricular diastolic parameters are consistent  with Grade II diastolic dysfunction (pseudonormalization). Basal function  is preserved.   2. Right ventricular systolic function is normal. The right ventricular  size is normal. Tricuspid regurgitation signal is inadequate for assessing  PA pressure.   3. Left atrial size was severely dilated.   4. Mitral valve leaflet thickening and doming without clear  calcifications. The mitral valve is abnormal. Mild mitral valve  regurgitation.   5. The aortic valve is calcified. Aortic valve regurgitation is mild to  moderate. Moderate aortic valve stenosis. Aortic regurgitation PHT  measures 306 msec. Aortic valve area, by VTI measures 1.02 cm. Aortic  valve Vmax measures 3.02 m/s.   6. There is mild dilatation of the ascending aorta, measuring 39 mm. Mild  when indexed to age, gender, and BSA.   7. The inferior vena cava is normal in size with greater than 50%  respiratory variability, suggesting right atrial pressure of 3 mmHg.   Comparison(s): Prior images unable to be directly viewed, comparison made  by report only.   Antimicrobials:  Anti-infectives (From  admission, onward)    Start     Dose/Rate Route Frequency Ordered Stop   05/09/23 1800  itraconazole (SPORANOX) 10 MG/ML solution 200 mg  Status:  Discontinued        200 mg Oral Every 24 hours 05/09/23 1616 05/09/23 1624   05/09/23 1800  itraconazole (SPORANOX) capsule 200 mg        200 mg Oral Every 24 hours 05/09/23 1624     05/04/23 0100  oseltamivir (TAMIFLU) capsule 30 mg        30 mg Oral Daily 05/04/23 0004 05/08/23 0835       Subjective: Frustrated with palliative conversation  Objective: Vitals:   05/09/23 2110 05/10/23 0130 05/10/23 0405 05/10/23 1329  BP: 130/77 (!) 105/59 117/72 127/81  Pulse: 89  60 76  Resp: 17 20 19 17   Temp: 97.9 F (36.6 C)  98.1 F (36.7 C) 98 F (36.7 C)  TempSrc: Oral  Oral Oral  SpO2: 95% 100% 100% 99%  Weight:   39.7 kg   Height:        Intake/Output Summary (Last 24 hours) at 05/10/2023 1742 Last data filed at 05/10/2023 1300 Gross per 24 hour  Intake 490.55 ml  Output --  Net 490.55 ml    Filed Weights   05/08/23 0506 05/09/23 0411 05/10/23 0405  Weight: 38.7 kg 39.9 kg 39.7 kg    Examination:  General: No acute distress. Cardiovascular: RRR Lungs: unlabored Neurological: Alert, moving all extremities Extremities: No clubbing or cyanosis. No edema.   Data Reviewed: I have personally reviewed following labs and imaging studies  CBC: Recent Labs  Lab 05/04/23 0354 05/07/23 0506 05/08/23 0326 05/09/23 0314 05/09/23 0849 05/10/23 0401  WBC 9.3 12.1* 10.3 8.7  --  6.2  NEUTROABS  --  10.0* 9.0*  --   --  4.1  HGB 9.4* 10.5* 9.5* 8.7* 8.7* 8.7*  HCT 31.3* 34.0* 30.9* 28.0* 28.2* 28.5*  MCV 70.2* 68.0* 67.2* 66.7*  --  67.4*  PLT 348 316 333 310  --  310    Basic Metabolic Panel: Recent Labs  Lab 05/06/23 0409 05/07/23 0506 05/08/23 0326 05/09/23 0314 05/10/23 0401  NA 144 143 146* 136 135  K 3.8 3.9 3.3* 4.0 4.2  CL 108 107 108 102 101  CO2 23 25 26 25 24   GLUCOSE 103* 91 79 131* 94  BUN 24* 21 16  14 11   CREATININE 1.31* 1.32* 1.12* 1.14* 1.14*  CALCIUM 9.2 9.5 9.3 9.0 9.1  MG 2.8* 2.3 2.0  --  1.8  PHOS  --  3.1 2.8  --  3.0    GFR: Estimated Creatinine Clearance: 24.3 mL/min (Hasson Gaspard) (by C-G formula based on SCr of 1.14 mg/dL (H)).  Liver Function Tests: Recent Labs  Lab 05/04/23 0354 05/07/23 0506 05/08/23 0326 05/10/23 0401  AST 68* 56* 43* 38  ALT 47* 39 33 34  ALKPHOS 56 56 56 56  BILITOT 1.1 0.9 1.1 0.6  PROT 6.7 7.5 7.0 6.7  ALBUMIN 3.3* 3.4* 2.9* 2.7*    CBG: No results for input(s): "GLUCAP" in the last 168 hours.   Recent Results (from the past 240 hours)  Resp panel by RT-PCR (RSV, Flu Prayan Ulin&B, Covid) Anterior Nasal Swab     Status: Abnormal   Collection Time: 05/03/23  3:57 PM   Specimen: Anterior Nasal Swab  Result Value Ref Range Status   SARS Coronavirus 2 by RT PCR NEGATIVE NEGATIVE Final    Comment: (NOTE) SARS-CoV-2 target nucleic acids are NOT DETECTED.  The SARS-CoV-2 RNA is generally detectable in upper respiratory specimens during the acute phase of infection. The lowest concentration of SARS-CoV-2 viral copies this assay can detect is 138 copies/mL. Markeita Alicia negative result does not preclude SARS-Cov-2 infection and should not be used as the sole basis for treatment or other patient management decisions. Christa Fasig negative result may occur with  improper specimen collection/handling, submission of specimen other than nasopharyngeal swab, presence of viral mutation(s) within the areas targeted by this assay, and inadequate number of viral copies(<138 copies/mL). Brogen Duell negative result must be combined with clinical observations, patient history, and epidemiological information. The expected result is Negative.  Fact Sheet for Patients:  BloggerCourse.com  Fact Sheet for Healthcare Providers:  SeriousBroker.it  This test is no t yet approved or cleared by the [place] FDA and  has been authorized for  detection and/or diagnosis of SARS-CoV-2 by FDA under an Emergency Use Authorization (EUA). This EUA will remain  in effect (meaning this test can be used) for the duration of the COVID-19 declaration under Section 564(b)(1) of the Act, 21 U.S.C.section 360bbb-3(b)(1), unless the authorization is terminated  or revoked sooner.       Influenza Brayden Betters by PCR POSITIVE (Sakina Briones) NEGATIVE Final   Influenza B by PCR NEGATIVE NEGATIVE Final    Comment: (NOTE) The Xpert Xpress SARS-CoV-2/FLU/RSV plus assay is intended as an aid in the diagnosis of influenza from Nasopharyngeal swab specimens and should not be used as [employer] sole basis for treatment. Nasal washings and aspirates are unacceptable for Xpert Xpress SARS-CoV-2/FLU/RSV testing.  Fact Sheet for Patients: BloggerCourse.com  Fact Sheet for Healthcare Providers: SeriousBroker.it  This test is not yet approved or cleared by the [place] FDA and has been authorized for detection and/or diagnosis of SARS-CoV-2 by FDA under an Emergency Use Authorization (EUA). This EUA will remain in effect (meaning this test can be used) for the duration of the COVID-19 declaration under Section 564(b)(1) of the Act, 21 U.S.C. section 360bbb-3(b)(1), unless the authorization is terminated or revoked.     Resp Syncytial Virus by PCR NEGATIVE NEGATIVE Final    Comment: (NOTE) Fact Sheet for Patients: BloggerCourse.com  Fact Sheet for Healthcare Providers: SeriousBroker.it  This test is not yet approved or cleared by the [place] FDA and has been authorized for detection and/or diagnosis of SARS-CoV-2 by FDA under an Emergency Use Authorization (EUA). This EUA will remain in effect (meaning this test can be used) for the duration of the COVID-19 declaration under Section 564(b)(1) of the Act, 21 U.S.C. section 360bbb-3(b)(1), unless the authorization is  terminated or revoked.  Performed at [facility], [address] [place], [place] [address]          Radiology Studies: DG ESOPHAGUS W SINGLE CM (SOL OR THIN BA) Result Date: 05/10/2023 CLINICAL DATA:  Weight loss. Poor oral intake. Patient also reports globus sensation in the proximal esophagus. Request for barium esophagram. EXAM: ESOPHAGUS/BARIUM SWALLOW/TABLET STUDY TECHNIQUE: Single contrast examination was performed using thin liquid barium. This exam was performed by Brayton El PA-C , and was supervised and interpreted by Dr. Irish Lack . FLUOROSCOPY: Radiation Exposure Index (as provided by the fluoroscopic device): 14.90 mGy Kerma COMPARISON:  CT chest/abdomen 05/08/2023 FINDINGS: Swallowing: Appears normal. No vestibular penetration or aspiration seen. Pharynx: Unremarkable. Esophagus: Suggestion of moderate focal narrowing in the proximal esophagus. 13 mm tablet does not advanced beyond this level. Remaining esophagus with irregular and thickened mucosal folds concerning for esophagitis. Distal esophagus also with long segment of smooth mild to moderate stricture. Esophageal motility: Limited evaluation of motility as patient could not lie prone. However, residual contrast stasis in the esophagus suggest some degree of dysmotility. Hiatal Hernia: None seen. Gastroesophageal reflux: None visualized. IMPRESSION: No evidence of aspiration or vestibular penetration. Moderate focal narrowing in the proximal esophagus that results in 13 mm tablet becoming lodged at this level. Irregular and thickened esophageal mucosa concerning for esophagitis. 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 Consider correlation with EGD. Electronically Signed   By: Irish Lack M.D.   On: 05/10/2023 16:23        Scheduled Meds:  allopurinol  100 mg Oral QPM   aspirin EC  81 mg Oral Daily   cycloSPORINE  1 drop Both Eyes BID   docusate sodium  100 mg Oral  BID   feeding supplement  237 mL Oral TID BM   ferrous sulfate  325 mg Oral Daily   itraconazole  200 mg Oral Q24H   metoprolol tartrate  12.5 mg Oral BID   multivitamin with minerals  1 tablet Oral Daily   pantoprazole (PROTONIX) IV  40 mg Intravenous Q12H   sucralfate  1 g Oral Q8H   Continuous Infusions:  heparin 750 Units/hr (05/10/23 0130)   [START ON 05/11/2023] thiamine (VITAMIN B1) injection       LOS: 6 days    Time spent: over 30 min    Lacretia Nicks, MD [facility]   To contact the attending provider between 7A-7P or the covering provider during after hours 7P-7A, please  log into the web site [email or web address] and access using universal [place] password for that web site. If you do not have the password, please call the hospital operator.  05/10/2023, 5:42 PM

## 2023-05-10 NOTE — Consult Note (Signed)
Palliative Medicine Inpatient Consult Note  Consulting Provider: Dr. Lowell Guitar  Reason for consult:   Palliative Care Consult Services Palliative Medicine Consult  Reason for Consult? goals of care   05/10/2023  HPI:  Per intake H&P --> [name] with history of aortic insufficiency, peripheral vascular disease, GERD, essential hypertension, pernicious anemia presented to the ED yesterday with multiple symptoms, progressive shortness of breath X 1 week, some swelling, cough and orthopnea, in addition also complained of congestion sinus pain and headaches.   The Palliative care team has been asked to get involved to support additional goals of care conversations, code status, and discuss patients ongoing adult failure to thrive.   Clinical Assessment/Goals of Care:  *Please note that this is a verbal dictation therefore any spelling or grammatical errors are due to the "Dragon Medical One" system interpretation.  I have reviewed medical records including EPIC notes, labs and imaging, received report from bedside RN, assessed the patient who is lying in bed in NAD.    I met with Holly Guerra to further discuss diagnosis prognosis, GOC, EOL wishes, disposition and options.   I introduced Palliative Medicine as specialized medical care for people living with serious illness. It focuses on providing relief from the symptoms and stress of a serious illness. The goal is to improve quality of life for both the patient and the family.  Medical History Review and Understanding:  A formal review of patient's history significant for aortic insufficiency, anemia, arthritis, coronary artery stenosis, coronary artery disease, congestive heart failure, GERD, hypertension, hyperlipidemia, peripheral vascular disease, and sickle cell trait was completed.  Social History:  Holly Guerra lives in [place].  She is widowed.  She had 3 children though one of her sons tragically died at the age of 45.  She has  multiple grandchildren.  She worked for [employer] for over 52 years.  She is a member of the whole [employer] den[employer].  She is a woman who is identified to be immensely private  Functional and Nutritional State:  Preceding hospitalization Holly Guerra was living in a single-family home with her daughter Holly Guerra.  Per Holly Guerra the majority of her time was spent in her bed though she would sporadically get up and mobilized to other parts of the house.  Holly Guerra's appetite has been dwindling for a matter of weeks now.  Advance Directives:  A detailed discussion was had today regarding advanced directives.  Holly Guerra has no advanced directives and at this time is not interested in completing them.  Code Status:  Concepts specific to code status, artifical feeding and hydration, continued IV antibiotics and rehospitalization was had.  The difference between a aggressive medical intervention path  and a palliative comfort care path for this patient at this time was had.   Encouraged patient/family to consider DNR/DNI status understanding evidenced based poor outcomes in similar hospitalized patient, as the cause of arrest is likely associated with advanced chronic/terminal illness rather than an easily reversible acute cardio-pulmonary event. I explained that DNR/DNI does not change the medical plan and it only comes into effect after a person has arrested (died).  It is a protective measure to keep [place] from harming the patient in their last moments of life.  Holly Guerra was not able to determine if she would want resuscitation or not.  I shared with her openly and honestly my concerns associated with resuscitating her based upon her age, comorbidities, and frailty.  I shared my worry that we were likely to cause more  burden than benefit in such circumstances.  That he frequently got off topic and it appeared that she did not understand the entirety of what we were discussing.  Holly Guerra was able to interject during our  conversation and shared that she does not believe her mother would ever want to live a life in a vegetative state dependent on others.  Provided  "Hard Choices for Loving People" booklet & a MOST form.    Discussion:  I openly and honestly discussed patient's chronic conditions inclusive of her heart failure, atrial fibrillation, and adult failure to thrive.  I shared my concerns associated with poor oral and nutritional intake over the long-term.  We discussed that this is often a precursor to poor health outcomes.  Holly Guerra was very frustrated during the context of our conversation and expressed that she did not know we would be talking about these things today.  She continued to perseverate on the various appointments that she has to keep though this did not align with the context of her conversation.  I asked Holly Guerra if she would like me to end the conversation and leave to speak to her daughter Holly Guerra privately which she begrudgingly agreed to. _____________________  I met with Holly Guerra in the 6E. family meeting room in the company of my colleague Darl Pikes.  Discussions were held in the setting of patient's chronic comorbid conditions-reviewed heart failure and the progressive nature of the disease despite medical management.  Reviewed patient's ongoing adult failure to thrive then concerned that in the long-term this carries with it a poor prognosis. Holly Guerra confirmed that tube feeding would not be desired.   Holly Guerra shares with me that her mother is always had an extremely strong personality and she has never been terribly open to discussing end-of-life or advanced care plan decisions.  Holly Guerra is tried exhaustively to have her mother make a living will and financial documents though Holly Guerra is ambivalent to do these things.  Holly Guerra shares that the conversation we had with Holly Guerra is quite similar to the ones she and the rest of her family members have had in the past.  I shared my concerns for the potential of underlying  dementia, Holly Guerra agrees that this is likely though the patient has never had formal neurocognitive testing.  We discussed delirium in the setting of dementia and how delirium can be exacerbated by the acute hospital environment.   We reviewed the differences between palliative supportive measures on the outpatient side as well as hospice supportive measures.  I shared that typically hospice is able to offer more robust services though there is the understanding that patients are progressing to their end-of-life and the emphasis is on quality dignity and symptom relief during that time.  Holly Guerra shares she has had a meeting with her family and they do want to take Marion home -or at least try to have her at home for short period of time.  She understands that this may be challenging and may not go as well as she hoped.  Holly Guerra asked about the plan for discharge which I shared I would defer to the primary team.  I told Holly Guerra very honestly that as it stands today I do not feel Holly Guerra is able to make decisions for her healthcare as she appears to lack in capacity at this time.   Discussed the importance of continued conversation with family and their  medical providers regarding overall plan of care and treatment options, ensuring decisions are within the context of the  patients values and GOCs.  Decision Maker: Carll,Kimberly (Daughter): [phone] (Mobile)   SUMMARY OF RECOMMENDATIONS   Full Code --> Patient ambivalent to make this decision though it does not appear she understands it as she is unable to repeat back to me risk versus benefit. Patients daughter does not feel the patient would want to live in a less functional state on life supportive measures indefinitely.  No artificial nutrition  Open and honest conversations held in the setting of patients acute on chronic disease burden  Discussed Palliative support versus Hospice  Appreciate [facility] [place] calling patient's daughter Holly Guerra to  discuss their services in greater detail  Ongoing PMT support  Code Status/Advance Care Planning: FULL CODE   Palliative Prophylaxis:  Aspiration, Bowel Regimen, Delirium Protocol, Frequent Pain Assessment, Oral Care, Palliative Wound Care, and Turn Reposition  Additional Recommendations (Limitations, Scope, Preferences): Continue current care  Psycho-social/Spiritual:  Desire for further Chaplaincy support: Not at this time. Additional Recommendations: Education on adult FTT, heart failure, resuscitation   Prognosis: Poor overall, hospice is a very reasonable consideration.   Discharge Planning: Discharge to home once medically optimized.  Vitals:   05/10/23 0130 05/10/23 0405  BP: (!) 105/59 117/72  Pulse:  60  Resp: 20 19  Temp:  98.1 F (36.7 C)  SpO2: 100% 100%    Intake/Output Summary (Last 24 hours) at 05/10/2023 0630 Last data filed at 05/10/2023 0000 Gross per 24 hour  Intake 490.55 ml  Output --  Net 490.55 ml   Last Weight  Most recent update: 05/10/2023  4:52 AM    Weight  39.7 kg (87 lb 8 oz)            Gen:  Elderly AA F chronically ill appearing HEENT: moist mucous membranes CV: Regular rate and irregular rhythm  PULM:  On RA, breathing is even and nonlabored ABD: soft/nontender  EXT: No edema  Neuro: Alert and oriented to person and place, thinks she came in for sinus congestion  PPS: 40%   This conversation/these recommendations were discussed with patient primary care team, Dr. Lowell Guitar  Total Time: 95 Billing based on MDM: High  Problems Addressed: One acute or chronic illness or injury that poses a threat to life or bodily function  Amount and/or Complexity of Data: Category 3:Discussion of management or test interpretation with external physician/other qualified health care professional/appropriate source (not separately reported)  Risks: Decision regarding elective major surgery with identified patient or procedure risk factors,  Decision regarding hospitalization or escalation of hospital care, and Decision not to resuscitate or to de-escalate care because of poor prognosis ______________________________________________________ Lamarr Lulas [facility] Palliative Medicine Team Team Cell Phone: [phone] Please utilize secure chat with additional questions, if there is no response within 30 minutes please call the above phone number  Palliative Medicine Team providers are available by phone from 7am to 7pm daily and can be reached through the team cell phone.  Should this patient require assistance outside of these hours, please call the patient's attending physician.

## 2023-05-10 NOTE — Progress Notes (Addendum)
Progress Note   Subjective  Patient denies complaints. Has been in AF with RVR, heparin drip started. She is also on Plavix and had that today. BArium study returned this evening. Daughter in room.   Objective   Vital signs in last 24 hours: Temp:  [97.9 F (36.6 C)-98.1 F (36.7 C)] 98 F (36.7 C) (12/30 1329) Pulse Rate:  [60-89] 76 (12/30 1329) Resp:  [17-20] 17 (12/30 1329) BP: (105-130)/(59-81) 127/81 (12/30 1329) SpO2:  [95 %-100 %] 99 % (12/30 1329) Weight:  [39.7 kg] 39.7 kg (12/30 0405) Last BM Date : 05/08/23 General:    [name] in NAD Neurologic:  Alert and oriented,  grossly normal neurologically. Psych:  Cooperative. Normal mood and affect.  Intake/Output from previous day: 12/29 0701 - 12/30 0700 In: 490.6 [P.O.:180; I.V.:145.6; IV Piggyback:165] Out: -  Intake/Output this shift: No intake/output data recorded.  Lab Results: Recent Labs    05/08/23 0326 05/09/23 0314 05/09/23 0849 05/10/23 0401  WBC 10.3 8.7  --  6.2  HGB 9.5* 8.7* 8.7* 8.7*  HCT 30.9* 28.0* 28.2* 28.5*  PLT 333 310  --  310   BMET Recent Labs    05/08/23 0326 05/09/23 0314 05/10/23 0401  NA 146* 136 135  K 3.3* 4.0 4.2  CL 108 102 101  CO2 26 25 24   GLUCOSE 79 131* 94  BUN 16 14 11   CREATININE 1.12* 1.14* 1.14*  CALCIUM 9.3 9.0 9.1   LFT Recent Labs    05/10/23 0401  PROT 6.7  ALBUMIN 2.7*  AST 38  ALT 34  ALKPHOS 56  BILITOT 0.6   PT/INR No results for input(s): "LABPROT", "INR" in the last 72 hours.  Studies/Results: DG ESOPHAGUS W SINGLE CM (SOL OR THIN BA) Result Date: 05/10/2023 CLINICAL DATA:  Weight loss. Poor oral intake. Patient also reports globus sensation in the proximal esophagus. Request for barium esophagram. EXAM: ESOPHAGUS/BARIUM SWALLOW/TABLET STUDY TECHNIQUE: Single contrast examination was performed using thin liquid barium. This exam was performed by Brayton El PA-C , and was supervised and interpreted by Dr. Irish Lack .  FLUOROSCOPY: Radiation Exposure Index (as provided by the fluoroscopic device): 14.90 mGy Kerma COMPARISON:  CT chest/abdomen 05/08/2023 FINDINGS: Swallowing: Appears normal. No vestibular penetration or aspiration seen. Pharynx: Unremarkable. Esophagus: Suggestion of moderate focal narrowing in the proximal esophagus. 13 mm tablet does not advanced beyond this level. Remaining esophagus with irregular and thickened mucosal folds concerning for esophagitis. Distal esophagus also with long segment of smooth mild to moderate stricture. Esophageal motility: Limited evaluation of motility as patient could not lie prone. However, residual contrast stasis in the esophagus suggest some degree of dysmotility. Hiatal Hernia: None seen. Gastroesophageal reflux: None visualized. IMPRESSION: No evidence of aspiration or vestibular penetration. Moderate focal narrowing in the proximal esophagus that results in 13 mm tablet becoming lodged at this level. Irregular and thickened esophageal mucosa concerning for esophagitis. 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 Consider correlation with EGD. Electronically Signed   By: Irish Lack M.D.   On: 05/10/2023 16:23       Assessment / Plan:    82 year old female here with the following:  Dysphagia, weight loss New heart failure in the setting of influenza infection Decrease in functional status  Vitamin B12 levels normal.  Started on empiric fluconazole.  Barium study completed today and report just returned.  I discussed the findings with the patient and her daughter.  There is evidence of  a proximal esophageal stricture where barium tablet got lodged.  There are nonspecific changes of the mucosa concerning for esophagitis and some narrowing of the distal esophagus as well.  There is pathology there that could correlate with her dysphagia.  Had a lengthy discussion with the patient and her daughter how aggressive they want to be with  this finding and her symptoms.  Offered her an endoscopy at some point in time to further evaluate and treat with dilation.  Discussed main risks of this would be worsening cardiopulmonary status in the setting of recent infection and CHF, AF, and bleeding in the setting of dilation.  She is currently on heparin drip which would need to be held and could do that easily, however the problem is that she just took Plavix today and that will take 5 days to washout from her system.  If we are considering a dilation, the Plavix would need to be held for 5 days prior to proceeding.  I will need to discuss with hospitalist to determine how long she is expected to be in the hospital and then discussed with the family if they want to hold the Plavix, if she cleared to do this, and understanding the risks of this, would they want a proceed or monitor.  In the interim I think she would do best on a full liquid diet.  I would also switch her to Protonix 40 mg IV twice daily while she is in the hospital, start liquid Carafate 10 cc every 6 hours as needed, and continue empiric fluconazole.  Patient and her daughter wish to think about this and we can make a decision about how they want to proceed tomorrow.  PLAN: - full liquid diet - try to crush all pills where possible or use liquid formulation when possible - start protonix 40mg  IV BID while in the hospital - start liquid carafate 10cc every 6 hours PRN - continue empiric fluconazole - consideration for EGD - discussed risks / benefits with the patient and daughter. She would need to hold Plavix for 5 days prior to consideration for dilation, at earliest EGD would not be done until the weekend. If she wants to proceed with this would need to stop the Plavix now. Will discuss with primary service and circle back with the family tomorrow to determine how they want to proceed  Call with questions.  Harlin Rain, MD [facility]

## 2023-05-11 ENCOUNTER — Other Ambulatory Visit (HOSPITAL_COMMUNITY): Payer: Self-pay

## 2023-05-11 ENCOUNTER — Telehealth (HOSPITAL_COMMUNITY): Payer: Self-pay | Admitting: Pharmacy Technician

## 2023-05-11 DIAGNOSIS — R634 Abnormal weight loss: Secondary | ICD-10-CM

## 2023-05-11 DIAGNOSIS — Z7902 Long term (current) use of antithrombotics/antiplatelets: Secondary | ICD-10-CM | POA: Diagnosis not present

## 2023-05-11 DIAGNOSIS — Z515 Encounter for palliative care: Secondary | ICD-10-CM | POA: Diagnosis not present

## 2023-05-11 DIAGNOSIS — R1310 Dysphagia, unspecified: Secondary | ICD-10-CM | POA: Diagnosis not present

## 2023-05-11 DIAGNOSIS — I509 Heart failure, unspecified: Secondary | ICD-10-CM | POA: Diagnosis not present

## 2023-05-11 DIAGNOSIS — Z7189 Other specified counseling: Secondary | ICD-10-CM | POA: Diagnosis not present

## 2023-05-11 LAB — CBC WITH DIFFERENTIAL/PLATELET
Abs Immature Granulocytes: 0 10*3/uL (ref 0.00–0.07)
Basophils Absolute: 0.2 10*3/uL — ABNORMAL HIGH (ref 0.0–0.1)
Basophils Relative: 3 %
Eosinophils Absolute: 0.1 10*3/uL (ref 0.0–0.5)
Eosinophils Relative: 1 %
HCT: 29.6 % — ABNORMAL LOW (ref 36.0–46.0)
Hemoglobin: 9.1 g/dL — ABNORMAL LOW (ref 12.0–15.0)
Lymphocytes Relative: 9 %
Lymphs Abs: 0.5 10*3/uL — ABNORMAL LOW (ref 0.7–4.0)
MCH: 20.6 pg — ABNORMAL LOW (ref 26.0–34.0)
MCHC: 30.7 g/dL (ref 30.0–36.0)
MCV: 67 fL — ABNORMAL LOW (ref 80.0–100.0)
Monocytes Absolute: 0.7 10*3/uL (ref 0.1–1.0)
Monocytes Relative: 13 %
Neutro Abs: 3.9 10*3/uL (ref 1.7–7.7)
Neutrophils Relative %: 74 %
Platelets: 299 10*3/uL (ref 150–400)
RBC: 4.42 MIL/uL (ref 3.87–5.11)
RDW: 19.7 % — ABNORMAL HIGH (ref 11.5–15.5)
WBC: 5.3 10*3/uL (ref 4.0–10.5)
nRBC: 0 % (ref 0.0–0.2)
nRBC: 1 /100{WBCs} — ABNORMAL HIGH

## 2023-05-11 LAB — COMPREHENSIVE METABOLIC PANEL
ALT: 31 U/L (ref 0–44)
AST: 38 U/L (ref 15–41)
Albumin: 2.7 g/dL — ABNORMAL LOW (ref 3.5–5.0)
Alkaline Phosphatase: 56 U/L (ref 38–126)
Anion gap: 9 (ref 5–15)
BUN: 10 mg/dL (ref 8–23)
CO2: 27 mmol/L (ref 22–32)
Calcium: 9.1 mg/dL (ref 8.9–10.3)
Chloride: 103 mmol/L (ref 98–111)
Creatinine, Ser: 1.15 mg/dL — ABNORMAL HIGH (ref 0.44–1.00)
GFR, Estimated: 48 mL/min — ABNORMAL LOW (ref >60)
Glucose, Bld: 90 mg/dL (ref 70–99)
Potassium: 3.9 mmol/L (ref 3.5–5.1)
Sodium: 139 mmol/L (ref 135–145)
Total Bilirubin: 0.8 mg/dL (ref 0.0–1.2)
Total Protein: 6.8 g/dL (ref 6.5–8.1)

## 2023-05-11 LAB — HEPARIN LEVEL (UNFRACTIONATED): Heparin Unfractionated: 0.32 [IU]/mL (ref 0.30–0.70)

## 2023-05-11 LAB — MAGNESIUM: Magnesium: 1.9 mg/dL (ref 1.7–2.4)

## 2023-05-11 LAB — PHOSPHORUS: Phosphorus: 2.8 mg/dL (ref 2.5–4.6)

## 2023-05-11 MED ORDER — LORAZEPAM 2 MG/ML IJ SOLN: 1.0000 mg | Freq: Once | INTRAMUSCULAR | Status: DC

## 2023-05-11 MED ORDER — DICLOFENAC SODIUM 1 % EX GEL
2.0000 g | Freq: Four times a day (QID) | CUTANEOUS | Status: DC
  Administered 2023-05-11 – 2023-05-19 (×25): 2 g via TOPICAL
  Filled 2023-05-11: qty 100

## 2023-05-11 MED ORDER — LORAZEPAM 2 MG/ML IJ SOLN: 1.0000 mg | Freq: Once | INTRAMUSCULAR | Status: DC | PRN

## 2023-05-11 MED ORDER — LORAZEPAM 2 MG/ML IJ SOLN
1.0000 mg | Freq: Once | INTRAMUSCULAR | Status: AC
  Administered 2023-05-11: 1 mg via INTRAVENOUS
  Filled 2023-05-11: qty 1

## 2023-05-11 MED ORDER — LORAZEPAM 2 MG/ML IJ SOLN
1.0000 mg | Freq: Once | INTRAMUSCULAR | Status: AC | PRN
  Administered 2023-05-13: 1 mg via INTRAVENOUS
  Filled 2023-05-11: qty 1

## 2023-05-11 NOTE — Consult Note (Signed)
 [facility]: Referral received yesterday to discuss with family care connection vs [facility]osp[facility] at home. Information on both programs has been provided to pt daughter as they are still considering options but will call us  back with further questions or decision once made. Thank you for the opportunity to discuss services with this patient and family. If there are any further questions or assistance needed please Hampton Ege, RN [facility] [phone]. Will await daughter return call with decision to proceed.

## 2023-05-11 NOTE — TOC Progression Note (Signed)
 Transition of Care [facility]) - Progression Note    Patient Details  Name: Holly Guerra MRN: [id number] Date of Birth: 08/09/1940  [facility] CM/SW Contact  Graves-Bigelow, Erminio Deems, RN Phone Number: 05/11/2023, 1:47 PM  Clinical Narrative: Patient received a consult for home hospice. Case Manager spoke with daughter [name] regarding consult and she spoke with Roxanne with [facility]. Daughter states she is unsure of how to proceed. Daughter states the patient is awaiting a test and that would give them more information once they speak with MD regarding results. Per MD Perri, patient will likely have an EGD. Case Manager will continue to follow for transition of care needs as the patient progresses.   Expected Discharge Plan:  (Home health vs SNF) Barriers to Discharge: SNF Pending bed offer    Living arrangements for the past 2 months: Single Family Home  Social Determinants of Health (SDOH) Interventions SDOH Screenings   Food Insecurity: No Food Insecurity (05/04/2023)  Housing: Low Risk  (05/06/2023)  Transportation Needs: No Transportation Needs (05/06/2023)  Utilities: Not At Risk (05/04/2023)  Alcohol Screen: Low Risk  (05/06/2023)  Financial Resource Strain: Low Risk  (05/06/2023)  Tobacco Use: High Risk (05/04/2023)    Readmission Risk Interventions     No data to display

## 2023-05-11 NOTE — Progress Notes (Signed)
    05/11/23 2300  Provider Notification  Provider Name/Title Dr. Laveda  Date Provider Notified 05/11/23  Time Provider Notified 2300  Method of Notification Page  Notification Reason Change in status. Pt is not compliant with safety precaution, agitated, trying to get out of bed. She refused all bedtime meds, pulled IV which has Heparin  drips. Soft mittens were applied, IV tubing was hidden, and  wrapped with KirLex.Dr. Laveda was notified.   Provider response Evaluate remotely;See new orders for one time Ativan  1 mg IV.  Date of Provider Response 05/11/23  Time of Provider Response 2319    Wendi Dash, RN

## 2023-05-11 NOTE — Progress Notes (Signed)
 PROGRESS NOTE    Holly Guerra  FMW:[id number] DOB: 1940/05/14 DOA: 05/03/2023 PCP: Holly Ozell SAUNDERS, PA-C  Chief Complaint  Patient presents with   Weakness    Brief Narrative:   Holly Guerra was admitted to the hospital with the working diagnosis of acute heart failure in the setting of influenza Holly Guerra infection.    [name] with history of aortic insufficiency, peripheral vascular disease, GERD, essential hypertension, pernicious anemia presented to the ED yesterday with multiple symptoms, progressive shortness of breath X 1 week, some swelling, cough and orthopnea, in addition also complained of congestion sinus pain and headaches. -In the ED she was noted to be mildly hypoxic, BNP 4119, respiratory virus panel positive for influenza Holly Guerra, hemoglobin 9.2, creatinine 1.03,    Chest radiograph with cardiomegaly with bilateral hilar vascular congestion and cephalization of the vasculature.    EKG 89 bpm, left axis deviation, left anterior fascicular block, qtc 508, sinus rhythm with poor R R wave progression, no significant ST segment changes and negative T wave V5 and V6.   Assessment & Plan:   Principal Problem:   Acute heart failure (HCC) Active Problems:   Influenza with respiratory manifestation   Essential hypertension   AKI (acute kidney injury) (HCC)   Pernicious anemia   GERD (gastroesophageal reflux disease)   PVD (peripheral vascular disease) (HCC)   Protein-calorie malnutrition, severe   Dysphagia   Abnormal barium swallow   Antiplatelet or antithrombotic long-term use  Goals of Care Appreciate palliative assistance.  Failure to Thrive Weight Loss  Poor PO Intake Odynophagia  Glossitis  Concern for possible candidiasis Today she notes oral pain and pain with swallowing, present for at least 5 weeks or so - she has greyish/brown coating on tongue as well as ulcerations with grey slough.  Not classic appearance for thrush, but treating empirically for oropharyngeal and  esophageal candidiasis with itraconazole  with her pain/discomfort (no fluconazole  due to plavix ). GI consulted, appreciate assistance -> full liquid diet, PPI BID, carafate , itraconazole  - hold plavix  for possible EGD Narrowing in proximal esophagus, irregular and thickened esophageal mucosa concerning for esophagitis, mild to moderate segmental stricture of the distal esophagus. B12 wnl CT abdomen/pelvis without acute abnormality (limited eval due to motion and streak artifact) RD previously recommended cortrak, daughter [name] wasn't interested with concern she may pull this.  Will avoid for now.  Will see how she does with above measures.  Atrial Fibrillation with RVR Back in sinus, will start metoprolol   Continue heparin  gtt for now  Acute Metabolic Encephalopathy Dysarthria  Per discussion with daughter, waxing/waning encephalopathy, dysarthria for the past week Likely due to influenza infection  Will check head CT -> progressive atrophy and white matter disease, advanced for age High dose thiamine   Delirium precautions  Indeterminate Splenic Lesion Consider postcontrast CT vs MR   New onset of congestive heart failure (HCC) Echo with EF 45-50%, regional wall motion abnormalities, grade II diastolic dysfunction, moderate AS  Spironolactone , jardiance  Lasix    Influenza Holly Guerra with pneumonia No signs of bacterial superinfection.  oseltamivir  for antiviral therapy.    Regional Wall Motion Abnormality Plan for outpatient cardiology follow up   Right Foot Pain  Peripheral Vascular Disease Hx L SFA stent  Related to peripheral vascular disease?  Palpable pulse Will monitor for now   AKI (acute kidney injury) (HCC) Creatinine has risen with diuresis, will monitor    Hypokalemia.  Resolved  Essential hypertension amlodipine  and nebevilol.    Pernicious anemia Cell count has been stable.  Patient on B12 injections q 30 days.    Iron deficiency anemia, continue with oral iron  supplementation.    GERD (gastroesophageal reflux disease) Continue with pantoprazole .    PVD (peripheral vascular disease) (HCC) Continue blood pressure control  Patient on aspirin , and clopidogrel  (on hold) Hold cilostazol  in the setting of heart failure.  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 Papillary muscle hypertrophy. Left ventricular ejection fraction, by  estimation, is 45 to 50%. The left ventricle has mildly decreased  function. The left ventricle demonstrates regional wall motion  abnormalities (see scoring diagram/findings for  description). There is severe concentric left ventricular hypertrophy of  the inferior segment. Left ventricular diastolic parameters are consistent  with Grade II diastolic dysfunction (pseudonormalization). Basal function  is preserved.   2. Right ventricular systolic function is normal. The right ventricular  size is normal. Tricuspid regurgitation signal is inadequate for assessing  PA pressure.   3. Left atrial size was severely dilated.   4. Mitral valve leaflet thickening and doming without clear  calcifications. The mitral valve is abnormal. Mild mitral valve  regurgitation.   5. The aortic valve is calcified. Aortic valve regurgitation is mild to  moderate. Moderate aortic valve stenosis. Aortic regurgitation PHT  measures 306 msec. Aortic valve area, by VTI measures 1.02 cm. Aortic  valve Vmax measures 3.02 m/s.   6. There is mild dilatation of the ascending aorta, measuring 39 mm. Mild  when indexed to age, gender, and BSA.   7. The inferior vena cava is normal in size with greater than 50%  respiratory variability, suggesting right atrial pressure of 3  mmHg.   Comparison(s): Prior images unable to be directly viewed, comparison made  by report only.   Antimicrobials:  Anti-infectives (From admission, onward)    Start     Dose/Rate Route Frequency Ordered Stop   05/09/23 1800  itraconazole  (SPORANOX ) 10 MG/ML solution 200 mg  Status:  Discontinued        200 mg Oral Every 24 hours 05/09/23 1616 05/09/23 1624   05/09/23 1800  itraconazole  (SPORANOX ) capsule 200 mg        200 mg Oral Every 24 hours 05/09/23 1624     05/04/23 0100  oseltamivir  (TAMIFLU ) capsule 30 mg        30 mg Oral Daily 05/04/23 0004 05/08/23 0835       Subjective: C/o R foot pain   Objective: Vitals:   05/10/23 1329 05/10/23 1949 05/11/23 0500 05/11/23 0526  BP: 127/81 94/68  126/82  Pulse: 76 70  86  Resp: 17 19  20   Temp: 98 F (36.7 C) 97.8 F (36.6 C)  97.8 F (36.6 C)  TempSrc: Oral Oral  Oral  SpO2: 99% 97%  94%  Weight:   38.6 kg   Height:        Intake/Output Summary (Last 24 hours) at 05/11/2023 1336 Last data filed at 05/10/2023 1855 Gross per 24 hour  Intake 141.9 ml  Output --  Net 141.9 ml    Filed Weights   05/09/23 0411 05/10/23 0405 05/11/23 0500  Weight: 39.9 kg 39.7 kg 38.6 kg    Examination:  General: No acute distress. Cardiovascular: RRR Lungs: unlabored Neurological: Alert. Moves all  extremities 4 with equal strength. Cranial nerves II through XII grossly intact. Extremities: mild TTP to R foot, palpable distal pulses  Data Reviewed: I have personally reviewed following labs and imaging studies  CBC: Recent Labs  Lab 05/07/23 0506 05/08/23 0326 05/09/23 0314 05/09/23 0849 05/10/23 0401 05/11/23 0745  WBC 12.1* 10.3 8.7  --  6.2 5.3  NEUTROABS 10.0* 9.0*  --   --  4.1 3.9  HGB 10.5* 9.5* 8.7* 8.7* 8.7* 9.1*  HCT 34.0* 30.9* 28.0* 28.2* 28.5* 29.6*  MCV 68.0* 67.2* 66.7*  --  67.4* 67.0*  PLT 316 333 310  --  310 299    Basic Metabolic Panel: Recent Labs  Lab 05/06/23 0409 05/07/23 0506  05/08/23 0326 05/09/23 0314 05/10/23 0401 05/11/23 0745  NA 144 143 146* 136 135 139  K 3.8 3.9 3.3* 4.0 4.2 3.9  CL 108 107 108 102 101 103  CO2 23 25 26 25 24 27   GLUCOSE 103* 91 79 131* 94 90  BUN 24* 21 16 14 11 10   CREATININE 1.31* 1.32* 1.12* 1.14* 1.14* 1.15*  CALCIUM  9.2 9.5 9.3 9.0 9.1 9.1  MG 2.8* 2.3 2.0  --  1.8 1.9  PHOS  --  3.1 2.8  --  3.0 2.8    GFR: Estimated Creatinine Clearance: 23.4 mL/min (Arletta Lumadue) (by C-G formula based on SCr of 1.15 mg/dL (H)).  Liver Function Tests: Recent Labs  Lab 05/07/23 0506 05/08/23 0326 05/10/23 0401 05/11/23 0745  AST 56* 43* 38 38  ALT 39 33 34 31  ALKPHOS 56 56 56 56  BILITOT 0.9 1.1 0.6 0.8  PROT 7.5 7.0 6.7 6.8  ALBUMIN 3.4* 2.9* 2.7* 2.7*    CBG: No results for input(s): GLUCAP in the last 168 hours.   Recent Results (from the past 240 hours)  Resp panel by RT-PCR (RSV, Flu Keeley Sussman&B, Covid) Anterior Nasal Swab     Status: Abnormal   Collection Time: 05/03/23  3:57 PM   Specimen: Anterior Nasal Swab  Result Value Ref Range Status   SARS Coronavirus 2 by RT PCR NEGATIVE NEGATIVE Final    Comment: (NOTE) SARS-CoV-2 target nucleic acids are NOT DETECTED.  The SARS-CoV-2 RNA is generally detectable in upper respiratory specimens during the acute phase of infection. The lowest concentration of SARS-CoV-2 viral copies this assay can detect is 138 copies/mL. Dalores Weger negative result does not preclude SARS-Cov-2 infection and should not be used as the sole basis for treatment or other patient management decisions. Temprance Wyre negative result may occur with  improper specimen collection/handling, submission of specimen other than nasopharyngeal swab, presence of viral mutation(s) within the areas targeted by this assay, and inadequate number of viral copies(<138 copies/mL). Kennis Wissmann negative result must be combined with clinical observations, patient history, and epidemiological information. The expected result is Negative.  Fact Sheet for  Patients:  bloggercourse.com  Fact Sheet for Healthcare Providers:  seriousbroker.it  This test is no t yet approved or cleared by the [place]  FDA and  has been authorized for detection and/or diagnosis of SARS-CoV-2 by FDA under an Emergency Use Authorization (EUA). This EUA will remain  in effect (meaning this test can be used) for the duration of the COVID-19 declaration under Section 564(b)(1) of the Act, 21 U.S.C.section 360bbb-3(b)(1), unless the authorization is terminated  or revoked sooner.       Influenza Lutricia Widjaja by PCR POSITIVE (Mayana Irigoyen) NEGATIVE Final   Influenza B by PCR NEGATIVE NEGATIVE Final    Comment: (NOTE) The  Xpert Xpress SARS-CoV-2/FLU/RSV plus assay is intended as an aid in the diagnosis of influenza from Nasopharyngeal swab specimens and should not be used as Tomasina Keasling sole basis for treatment. Nasal washings and aspirates are unacceptable for Xpert Xpress SARS-CoV-2/FLU/RSV testing.  Fact Sheet for Patients: bloggercourse.com  Fact Sheet for Healthcare Providers: seriousbroker.it  This test is not yet approved or cleared by the [place]  FDA and has been authorized for detection and/or diagnosis of SARS-CoV-2 by FDA under an Emergency Use Authorization (EUA). This EUA will remain in effect (meaning this test can be used) for the duration of the COVID-19 declaration under Section 564(b)(1) of the Act, 21 U.S.C. section 360bbb-3(b)(1), unless the authorization is terminated or revoked.     Resp Syncytial Virus by PCR NEGATIVE NEGATIVE Final    Comment: (NOTE) Fact Sheet for Patients: bloggercourse.com  Fact Sheet for Healthcare Providers: seriousbroker.it  This test is not yet approved or cleared by the [place]  FDA and has been authorized for detection and/or diagnosis of SARS-CoV-2 by FDA under an  Emergency Use Authorization (EUA). This EUA will remain in effect (meaning this test can be used) for the duration of the COVID-19 declaration under Section 564(b)(1) of the Act, 21 U.S.C. section 360bbb-3(b)(1), unless the authorization is terminated or revoked.  Performed at [facility], [address] [place], [place] [address]          Radiology Studies: DG ESOPHAGUS W SINGLE CM (SOL OR THIN BA) Result Date: 05/10/2023 CLINICAL DATA:  Weight loss. Poor oral intake. Patient also reports globus sensation in the proximal esophagus. Request for barium esophagram. EXAM: ESOPHAGUS/BARIUM SWALLOW/TABLET STUDY TECHNIQUE: Single contrast examination was performed using thin liquid barium. This exam was performed by Franky Rusk PA-C , and was supervised and interpreted by Dr. Marcey Moan . FLUOROSCOPY: Radiation Exposure Index (as provided by the fluoroscopic device): 14.90 mGy Kerma COMPARISON:  CT chest/abdomen 05/08/2023 FINDINGS: Swallowing: Appears normal. No vestibular penetration or aspiration seen. Pharynx: Unremarkable. Esophagus: Suggestion of moderate focal narrowing in the proximal esophagus. 13 mm tablet does not advanced beyond this level. Remaining esophagus with irregular and thickened mucosal folds concerning for esophagitis. Distal esophagus also with long segment of smooth mild to moderate stricture. Esophageal motility: Limited evaluation of motility as patient could not lie prone. However, residual contrast stasis in the esophagus suggest some degree of dysmotility. Hiatal Hernia: None seen. Gastroesophageal reflux: None visualized. IMPRESSION: No evidence of aspiration or vestibular penetration. Moderate focal narrowing in the proximal esophagus that results in 13 mm tablet becoming lodged at this level. Irregular and thickened esophageal mucosa concerning for esophagitis. 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 Consider correlation  with EGD. Electronically Signed   By: Marcey Moan M.D.   On: 05/10/2023 16:23        Scheduled Meds:  allopurinol   100 mg Oral QPM   aspirin  EC  81 mg Oral Daily   cycloSPORINE   1 drop Both Eyes BID   diclofenac  Sodium  2 g Topical QID   docusate sodium   100 mg Oral BID   feeding supplement  237 mL Oral TID BM   ferrous sulfate   325 mg Oral Daily   itraconazole   200 mg Oral Q24H   metoprolol  tartrate  12.5 mg Oral BID   multivitamin with minerals  1 tablet Oral Daily   pantoprazole  (PROTONIX ) IV  40 mg Intravenous Q12H   sucralfate   1 g Oral Q8H   traZODone   50  mg Oral QHS   Continuous Infusions:  heparin  750 Units/hr (05/10/23 0130)   thiamine  (VITAMIN B1) injection 250 mg (05/11/23 0911)     LOS: 7 days    Time spent: over 30 min    Meliton Monte, MD [facility]   To contact the attending provider between 7A-7P or the covering provider during after hours 7P-7A, please log into the web site [email or web address] and access using universal [place] password for that web site. If you do not have the password, please call the hospital operator.  05/11/2023, 1:36 PM

## 2023-05-11 NOTE — Progress Notes (Signed)
 Physical Therapy Treatment Patient Details Name: Holly Guerra MRN: [id number] DOB: 1940/12/03 Today's Date: 05/11/2023   History of Present Illness 82 y.o. female presents to [facility] hospital on 12.23.2024 with SOB. Pt admitted for management of new onset CHF. Pt also found to be positive for flu. PMH includes aortic valve disease, PVD, GERD, anemia, HTN.    PT Comments  Pt received in supine and agreeable to session with encouragement. Pt requests to use the bathroom at the beginning of the session. Pt requires increased time to complete tasks due to quick fatigue and weakness. Pt able to void and complete pericare with up to CGA for safety due to instability without UE support. Pt leans on the sink for support to wash hands. Pt's gait distance limited by fatigue and requests to return to supine. Pt requires increased time and cues for problem solving during mobility tasks. Pt continues to benefit from PT services to progress toward functional mobility goals.    If plan is discharge home, recommend the following: A little help with walking and/or transfers;A little help with bathing/dressing/bathroom;Assistance with cooking/housework;Assist for transportation;Help with stairs or ramp for entrance;Direct supervision/assist for medications management   Can travel by private vehicle     Yes  Equipment Recommendations  Rolling walker (2 wheels) (defer to next venue)    Recommendations for Other Services       Precautions / Restrictions Precautions Precautions: Fall Restrictions Weight Bearing Restrictions Per Provider Order: No     Mobility  Bed Mobility Overal bed mobility: Needs Assistance Bed Mobility: Supine to Sit, Sit to Supine     Supine to sit: Supervision Sit to supine: Supervision   General bed mobility comments: increased time and cues for technique    Transfers Overall transfer level: Needs assistance Equipment used: Rolling walker (2 wheels) Transfers: Sit to/from  Stand Sit to Stand: Contact guard assist           General transfer comment: cues for hand placement and CGA for safety    Ambulation/Gait Ambulation/Gait assistance: Contact guard assist Gait Distance (Feet): 10 Feet (x2) Assistive device: Rolling walker (2 wheels) Gait Pattern/deviations: Step-through pattern, Decreased stride length, Trunk flexed       General Gait Details: Pt demonstrates slow, short steps with very low foot clearance despite cues not to slide feet forward. Cues for RW proximity with pt only briefly improving and demonstrating difficulty with obstacle negotiation       Balance Overall balance assessment: Needs assistance Sitting-balance support: No upper extremity supported, Feet supported Sitting balance-Leahy Scale: Good     Standing balance support: Bilateral upper extremity supported, Reliant on assistive device for balance, During functional activity Standing balance-Leahy Scale: Poor Standing balance comment: with RW support                            Cognition Arousal: Alert Behavior During Therapy: WFL for tasks assessed/performed Overall Cognitive Status: Impaired/Different from baseline                                          Exercises      General Comments        Pertinent Vitals/Pain Pain Assessment Pain Assessment: Faces Faces Pain Scale: Hurts a little bit Pain Location: Rt foot/ankle Pain Descriptors / Indicators: Aching, Guarding Pain Intervention(s): Monitored during session, Repositioned  PT Goals (current goals can now be found in the care plan section) Acute Rehab PT Goals Patient Stated Goal: to reduce pain in RLE PT Goal Formulation: With patient Time For Goal Achievement: 05/18/23 Progress towards PT goals: Progressing toward goals    Frequency    Min 1X/week       AM-PAC PT 6 Clicks Mobility   Outcome Measure  Help needed turning from your back to your side while  in a flat bed without using bedrails?: A Little Help needed moving from lying on your back to sitting on the side of a flat bed without using bedrails?: A Little Help needed moving to and from a bed to a chair (including a wheelchair)?: A Little Help needed standing up from a chair using your arms (e.g., wheelchair or bedside chair)?: A Little Help needed to walk in hospital room?: A Little Help needed climbing 3-5 steps with a railing? : A Lot 6 Click Score: 17    End of Session Equipment Utilized During Treatment: Gait belt Activity Tolerance: Patient tolerated treatment well;Patient limited by fatigue Patient left: in bed;with call bell/phone within reach;with bed alarm set Nurse Communication: Mobility status PT Visit Diagnosis: Other abnormalities of gait and mobility (R26.89);Muscle weakness (generalized) (M62.81);Pain     Time: 8946-8880 PT Time Calculation (min) (ACUTE ONLY): 26 min  Charges:    $Gait Training: 8-22 mins $Therapeutic Activity: 8-22 mins PT General Charges $$ ACUTE PT VISIT: 1 Visit                     Darryle George, PTA Acute Rehabilitation Services Secure Chat Preferred  [phone]    Darryle George 05/11/2023, 12:43 PM

## 2023-05-11 NOTE — Progress Notes (Signed)
 Nutrition Follow-up  DOCUMENTATION CODES:   Severe malnutrition in context of chronic illness  INTERVENTION:  - Continue regular diet and encourage PO intake with feeding assistance - upon completion of EGD - Change diet order to with assist - continue/complete - Ensure Enlive po TID - continue - MVI with minerals daily - continue  -Add Jell-O w/ meals -Add bedtime snack  - 100 mg Thiamine  daily - continue  NUTRITION DIAGNOSIS:   Severe Malnutrition related to chronic illness as evidenced by severe muscle depletion, severe fat depletion. - ongoing  GOAL:  Patient will meet greater than or equal to 90% of their needs - not meeting/ongoing   MONITOR:  PO intake, Weight trends, Supplement acceptance, Labs, I & O's  REASON FOR ASSESSMENT:  Consult Assessment of nutrition requirement/status  ASSESSMENT:   82 y.o Female with PMH of HTN, aortic valvular disease  pernicious anemia, GERD, arthritis, PVD, tobacco use, CAD. Presented with shortness of breath and weakness. Found to have acute heart failure in the setting of influenza A infection  Palliative care meeting held. Patient daughter stating patient does not want artificial nutrition. Reinforced in hospitalist note on 12/29.  Hospice consulting to provide more education. Patient currently on full liquid diet d/t pending EGD.   Spoke with family at bedside along with patient today. Reporting she'd like Jell-O with meals and a bedtime snack to try to supplement intake. Has only had some chicken broth today as she waits on procedure.  Intake of meals remains inadequate. Per nursing, patient intake of Ensure Enlive also inadequate (around [employer] offerings).    Admission Weight: 38.7 kg Current Weight: 38.6 kg  Per GI note on 12/29, son was at bedside and endorsed UBW in 130s w/ functional decline occurring over last month. Pain in mouth reported by patient. Fluconazole  ordered for glossitis as well as regular oral care and magic  mouthwash. Hoping reduction in pain will improve appetite.    Labs: B12 3,174 CRP 1.7 (12/24) Crt 1.15<--1.14<--1.12  Meds: ferrous sulfate , MVI, trazodone , thiamine   Diet Order:   Diet Order             Diet full liquid Room service appropriate? Yes; Fluid consistency: Thin  Diet effective now            EDUCATION NEEDS:   Not appropriate for education at this time  Skin:  Skin Assessment: Reviewed RN Assessment  Last BM:  05/04/2023  Height:  Ht Readings from Last 1 Encounters:  05/08/23 5' 2 (1.575 m)    Weight:  Wt Readings from Last 1 Encounters:  05/11/23 38.6 kg    Ideal Body Weight:  50 kg  BMI:  Body mass index is 15.56 kg/m.  Estimated Nutritional Needs:   Kcal:  1400-1600 kcal  Protein:  70-90 gm  Fluid:  >1.4L or per MD   Blair Deaner MS, RD, LDN Registered Dietitian Clinical Nutrition RD Inpatient Contact Info in Amion

## 2023-05-11 NOTE — Telephone Encounter (Signed)
 Patient Product/process Development Scientist completed.    The patient is insured through [facility]. Patient has Medicare and is not eligible for a copay card, but may be able to apply for patient assistance, if available.    Ran test claim for voriconazole (Vfend) 200 mg and the current 30 day co-pay is $66.85.  Ran test claim for intraconazole 100mg  and Requires Prior Authorization  This test claim was processed through [facility]- copay amounts may vary at other pharmacies due to pharmacy/plan contracts, or as the patient moves through the different stages of their insurance plan.     Reyes Sharps, CPHT Pharmacy Technician III Certified Patient Advocate [facility] Patient Advocate Team Direct Number: [phone]  Fax: [phone]

## 2023-05-11 NOTE — Progress Notes (Incomplete)
    05/11/23 2300  Provider Notification  Provider Name/Title Dr. Joneen Roach  Date Provider Notified 05/11/23  Time Provider Notified 2300  Method of Notification Page  Notification Reason Change in status. Pt is not compliant with safety precaution, agitated, trying to get out of bed. She refused all bedtime meds, pulled IV which has Heparin drips. Soft mittens were applied, IV tubing was hidden, and  wrapped with KirLex.Dr. Joneen Roach was notified.   Provider response Evaluate remotely;See new orders for one time Ativan I mg IV.  Date of Provider Response 05/11/23  Time of Provider Response [id number]

## 2023-05-11 NOTE — Progress Notes (Signed)
 Occupational Therapy Treatment Patient Details Name: Holly Guerra MRN: [id number] DOB: 01/01/1941 Today's Date: 05/11/2023   History of present illness 82 y.o. female presents to [facility] on 12.23.2024 with SOB. Pt admitted for management of new onset CHF. Pt also found to be positive for flu. PMH includes aortic valve disease, PVD, GERD, anemia, HTN.   OT comments  Pt progressing slowly toward established OT goals secondary to continued confusion. Pt needing increased time and cues for LB ADL this session. Min A and cues for mobility with RW. Standing at sink, pt with decreased command following; performed grooming tasks seated with better command following in seated position. RN aware. Left supine in bed with all needs met and bed alarm on. Patient will benefit from continued inpatient follow up therapy, <3 hours/day       If plan is discharge home, recommend the following:  A little help with walking and/or transfers;A little help with bathing/dressing/bathroom;Assistance with cooking/housework;Assist for transportation;Help with stairs or ramp for entrance;Direct supervision/assist for medications management;Direct supervision/assist for financial management   Equipment Recommendations  Other (comment) (defer)    Recommendations for Other Services Speech consult (cog)    Precautions / Restrictions Precautions Precautions: Fall;Other (comment) Precaution Comments: delirium Restrictions Weight Bearing Restrictions Per Provider Order: No       Mobility Bed Mobility Overal bed mobility: Needs Assistance Bed Mobility: Supine to Sit, Sit to Supine     Supine to sit: Supervision Sit to supine: Supervision   General bed mobility comments: increased time and cues for technique    Transfers Overall transfer level: Needs assistance Equipment used: Rolling walker (2 wheels) Transfers: Sit to/from Stand Sit to Stand: Contact guard assist           General transfer comment:  cues for hand placement and CGA for safety     Balance Overall balance assessment: Needs assistance Sitting-balance support: No upper extremity supported, Feet supported Sitting balance-Leahy Scale: Good     Standing balance support: Bilateral upper extremity supported, Reliant on assistive device for balance, During functional activity Standing balance-Leahy Scale: Poor Standing balance comment: with RW support                           ADL either performed or assessed with clinical judgement   ADL Overall ADL's : Needs assistance/impaired                     Lower Body Dressing: Contact guard assist;Sitting/lateral leans Lower Body Dressing Details (indicate cue type and reason): to don socks. CGA and min cues to sustain task Toilet Transfer: Contact guard assist;Minimal assistance Toilet Transfer Details (indicate cue type and reason): min A for rise; min A for RW management         Functional mobility during ADLs: Minimal assistance;Rolling walker (2 wheels)      Extremity/Trunk Assessment Upper Extremity Assessment Upper Extremity Assessment: Overall WFL for tasks assessed   Lower Extremity Assessment Lower Extremity Assessment: Generalized weakness        Vision       Perception     Praxis      Cognition Arousal: Alert Behavior During Therapy: WFL for tasks assessed/performed Overall Cognitive Status: Impaired/Different from baseline Area of Impairment: Awareness, Problem solving, Memory, Orientation, Attention, Following commands, Safety/judgement                 Orientation Level: Disoriented to, Place, Situation Current Attention Level: Sustained Memory:  Decreased short-term memory (after command to walk to sink pt states where are we going, the bathroom) Following Commands: Follows one step commands with increased time Safety/Judgement: Decreased awareness of safety, Decreased awareness of deficits Awareness: Emergent,  Intellectual (no awareness of cogntiive deficits) Problem Solving: Slow processing, Difficulty sequencing, Requires verbal cues          Exercises      Shoulder Instructions       General Comments      Pertinent Vitals/ Pain       Pain Assessment Pain Assessment: Faces Faces Pain Scale: Hurts a little bit Pain Location: Rt foot/ankle Pain Descriptors / Indicators: Aching, Guarding Pain Intervention(s): Limited activity within patient's tolerance, Monitored during session  Home Living                                          Prior Functioning/Environment              Frequency  Min 1X/week        Progress Toward Goals  OT Goals(current goals can now be found in the care plan section)  Progress towards OT goals: Progressing toward goals  Acute Rehab OT Goals Patient Stated Goal: unable/confused OT Goal Formulation: With patient Time For Goal Achievement: 05/18/23 Potential to Achieve Goals: Good ADL Goals Pt Will Perform Grooming: standing;with supervision Pt Will Perform Lower Body Dressing: with supervision;sit to/from stand Pt Will Transfer to Toilet: with supervision;ambulating  Plan      Co-evaluation                 AM-PAC OT 6 Clicks Daily Activity     Outcome Measure   Help from another person eating meals?: None Help from another person taking care of personal grooming?: A Little Help from another person toileting, which includes using toliet, bedpan, or urinal?: A Lot Help from another person bathing (including washing, rinsing, drying)?: A Little Help from another person to put on and taking off regular upper body clothing?: A Little Help from another person to put on and taking off regular lower body clothing?: A Little 6 Click Score: 18    End of Session Equipment Utilized During Treatment: Gait belt;Rolling walker (2 wheels)  OT Visit Diagnosis: Unsteadiness on feet (R26.81);Muscle weakness (generalized)  (M62.81);Other symptoms and signs involving cognitive function   Activity Tolerance Patient tolerated treatment well;Other (comment) (limited by decreased cognition with mobility)   Patient Left in bed;with call bell/phone within reach;with bed alarm set   Nurse Communication Mobility status        Time: 8791-8769 OT Time Calculation (min): 22 min  Charges: OT General Charges $OT Visit: 1 Visit OT Treatments $Self Care/Home Management : 8-22 mins  [name] FREDERICK, OTR/L [facility] Acute Rehabilitation Office: [phone]   [name] 05/11/2023, 2:17 PM

## 2023-05-11 NOTE — Progress Notes (Signed)
 Progress Note   Subjective  Patient tolerating liquids. She denies dysphagia with liquids. Does endorse odynophagia. She is not sure if fluconazole  has helped much   Objective   Vital signs in last 24 hours: Temp:  [97.7 F (36.5 C)-97.8 F (36.6 C)] 97.7 F (36.5 C) (12/31 1600) Pulse Rate:  [70-86] 86 (12/31 0526) Resp:  [18-20] 18 (12/31 1600) BP: (94-126)/(68-82) 126/82 (12/31 0526) SpO2:  [94 %-97 %] 94 % (12/31 0526) Weight:  [38.6 kg] 38.6 kg (12/31 0500) Last BM Date : 05/08/23 General:    [name] in NAD. Tongue is ulcerated Neurologic:  Alert and oriented,  grossly normal neurologically. Psych:  Cooperative. Normal mood and affect.  Intake/Output from previous day: 12/30 0701 - 12/31 0700 In: 141.9 [I.V.:141.9] Out: -  Intake/Output this shift: Total I/O In: 50 [P.O.:50] Out: -   Lab Results: Recent Labs    05/09/23 0314 05/09/23 0849 05/10/23 0401 05/11/23 0745  WBC 8.7  --  6.2 5.3  HGB 8.7* 8.7* 8.7* 9.1*  HCT 28.0* 28.2* 28.5* 29.6*  PLT 310  --  310 299   BMET Recent Labs    05/09/23 0314 05/10/23 0401 05/11/23 0745  NA 136 135 139  K 4.0 4.2 3.9  CL 102 101 103  CO2 25 24 27   GLUCOSE 131* 94 90  BUN 14 11 10   CREATININE 1.14* 1.14* 1.15*  CALCIUM  9.0 9.1 9.1   LFT Recent Labs    05/11/23 0745  PROT 6.8  ALBUMIN 2.7*  AST 38  ALT 31  ALKPHOS 56  BILITOT 0.8   PT/INR No results for input(s): LABPROT, INR in the last 72 hours.  Studies/Results: DG ESOPHAGUS W SINGLE CM (SOL OR THIN BA) Result Date: 05/10/2023 CLINICAL DATA:  Weight loss. Poor oral intake. Patient also reports globus sensation in the proximal esophagus. Request for barium esophagram. EXAM: ESOPHAGUS/BARIUM SWALLOW/TABLET STUDY TECHNIQUE: Single contrast examination was performed using thin liquid barium. This exam was performed by Franky Rusk PA-C , and was supervised and interpreted by Dr. Marcey Moan . FLUOROSCOPY: Radiation Exposure Index (as  provided by the fluoroscopic device): 14.90 mGy Kerma COMPARISON:  CT chest/abdomen 05/08/2023 FINDINGS: Swallowing: Appears normal. No vestibular penetration or aspiration seen. Pharynx: Unremarkable. Esophagus: Suggestion of moderate focal narrowing in the proximal esophagus. 13 mm tablet does not advanced beyond this level. Remaining esophagus with irregular and thickened mucosal folds concerning for esophagitis. Distal esophagus also with long segment of smooth mild to moderate stricture. Esophageal motility: Limited evaluation of motility as patient could not lie prone. However, residual contrast stasis in the esophagus suggest some degree of dysmotility. Hiatal Hernia: None seen. Gastroesophageal reflux: None visualized. IMPRESSION: No evidence of aspiration or vestibular penetration. Moderate focal narrowing in the proximal esophagus that results in 13 mm tablet becoming lodged at this level. Irregular and thickened esophageal mucosa concerning for esophagitis. 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 Consider correlation with EGD. Electronically Signed   By: Marcey Moan M.D.   On: 05/10/2023 16:23       Assessment / Plan:    83 year old female here with the following:   Dysphagia / odynophagia, weight loss New heart failure in the setting of influenza infection Decrease in functional status   Vitamin B12 levels normal.  Started on empiric fluconazole .  Barium study shows evidence of a proximal esophageal stricture where barium tablet got lodged.  There are nonspecific changes of the mucosa concerning for esophagitis  and some narrowing of the distal esophagus as well.  There is pathology there that could correlate with her dysphagia. She has odynophagia and ulcerations on her tongue of unclear etiology.    Had a lengthy discussion with the patient and her son today about the findings. We discussed if she wanted to pursue an EGD for diagnostic purposes and to  potentially dilate as well. She understands she is higher than average risk for anesthesia given her recent worsening of cardiopulmonary status. Further, would need to hold Plavix  5 days prior to an exam if considering dilation. Following discussion with the patient and her family, given how much this bothers her, she does wish to have the EGD prior to discharge. Continue to hold Plavix  in this light, her procedure would tentatively be done on Saturday at earliest in regards to the Plavix  washout.   In the interim I think she would do best on a full liquid diet and she has been doing okay with that so far. Continue Protonix  40 mg IV twice daily in the interim as well as liquid Carafate  10 cc every 6 hours as needed, and continue empiric antifungal therapy for candidiasis.    PLAN: - full liquid diet - try to crush all pills where possible or use liquid formulation when possible - continue protonix  40mg  IV BID while in the hospital - continue liquid carafate  10cc every 6 hours PRN - continue empiric antifungal for possible candidiasis - consideration for ENT consult in regards to tongue ulcerations - perhaps same process as affecting her esophagus - hold plavix , tentatively plan for EGD this weekend with possible dilation following Plavix  washout  We will reassess her on Friday, call in the interim with questions / concerns.  Marcey Naval, MD [facility]

## 2023-05-11 NOTE — Progress Notes (Signed)
   Palliative Medicine Inpatient Follow Up Note HPI: [name] with history of aortic insufficiency, peripheral vascular disease, GERD, essential hypertension, pernicious anemia presented to the ED yesterday with multiple symptoms, progressive shortness of breath X 1 week, some swelling, cough and orthopnea, in addition also complained of congestion sinus pain and headaches.    The Palliative care team has been asked to get involved to support additional goals of care conversations, code status, and discuss patients ongoing adult failure to thrive.   Today's Discussion 05/11/2023  *Please note that this is a verbal dictation therefore any spelling or grammatical errors are due to the Dragon Medical One system interpretation.  Chart reviewed inclusive of vital signs, progress notes, laboratory results, and diagnostic images.   I met with Holly Guerra this afternoon. She remembers meeting me yesterday though neglects to recall what our conversation was about. She shares with me that her right great toe hurts. Upon assessment is is not warm or swollen. It does not seem to hurt when I press on it. She requests pain medication.   I called and spoke to patients daughter, Holly Guerra this afternoon. Created space and opportunity for patients daughter to explore thoughts feelings and fears regarding [name]'s current medical situation. She is very much relying on the advisement of the medical team in regards to next steps. We reviewed the plan for full GI workup inclusive of EGD and further decisions to be made thereafter.   Questions and concerns addressed/Palliative Support Provided.   Objective Assessment: Vital Signs Vitals:   05/10/23 1949 05/11/23 0526  BP: 94/68 126/82  Pulse: 70 86  Resp: 19 20  Temp: 97.8 F (36.6 C) 97.8 F (36.6 C)  SpO2: 97% 94%    Intake/Output Summary (Last 24 hours) at 05/11/2023 1422 Last data filed at 05/10/2023 1855 Gross per 24 hour  Intake 141.9 ml  Output --  Net  141.9 ml   Last Weight  Most recent update: 05/11/2023  5:10 AM    Weight  38.6 kg (85 lb 1.6 oz)            Gen:  Elderly AA F chronically ill appearing HEENT: moist mucous membranes CV: Regular rate and irregular rhythm  PULM:  On RA, breathing is even and nonlabored ABD: soft/nontender  EXT: No edema  Neuro: Alert and oriented to person and place, thinks she came in for sinus congestion  SUMMARY OF RECOMMENDATIONS   Full Code at this time  Appreciate GI work up and recommendations  [facility] has spoken to patients daughter to explain Palliative versus hospice services  Additional decisions to be made once GI evaluation is complete   Ongoing PMT support  Billing based on MDM: Moderate ______________________________________________________________________________________ Holly Guerra [facility] Palliative Medicine Team Team Cell Phone: [phone] Please utilize secure chat with additional questions, if there is no response within 30 minutes please call the above phone number  Palliative Medicine Team providers are available by phone from 7am to 7pm daily and can be reached through the team cell phone.  Should this patient require assistance outside of these hours, please call the patient's attending physician.

## 2023-05-11 NOTE — Progress Notes (Signed)
 PHARMACY - ANTICOAGULATION CONSULT NOTE  Pharmacy Consult for heparin  Indication: atrial fibrillation  Allergies  Allergen Reactions   Lactose Intolerance (Gi) Diarrhea    Patient Measurements: Height: 5' 2 (157.5 cm) Weight: 38.6 kg (85 lb 1.6 oz) IBW/kg (Calculated) : 50.1 Heparin  Dosing Weight: TBW  Vital Signs: Temp: 97.8 F (36.6 C) (12/31 0526) Temp Source: Oral (12/31 0526) BP: 126/82 (12/31 0526) Pulse Rate: 86 (12/31 0526)  Labs: Recent Labs    05/09/23 0314 05/09/23 0849 05/09/23 1645 05/10/23 0401 05/11/23 0745  HGB 8.7* 8.7*  --  8.7* 9.1*  HCT 28.0* 28.2*  --  28.5* 29.6*  PLT 310  --   --  310 299  HEPARINUNFRC 0.25*  --  0.25* 0.35 0.32  CREATININE 1.14*  --   --  1.14* 1.15*    Estimated Creatinine Clearance: 23.4 mL/min (A) (by C-G formula based on SCr of 1.15 mg/dL (H)).   Medical History: Past Medical History:  Diagnosis Date   Abdominal bruit    Abnormal echocardiogram    Anemia    Aortic insufficiency    Aortic root dilation (HCC)    Aortic valve disorder    Arthritis    all over (07/22/2012)   Arthropathy    Bilateral carotid artery stenosis    Carotid artery disease (HCC)    Chest discomfort    Edema    Exertional shortness of breath    Fatigue    GERD (gastroesophageal reflux disease)    Gout    Hypercholesteremia    Hypertension    Mitral regurgitation and aortic stenosis    Mixed hyperlipidemia    Murmur, cardiac    Peripheral vascular disease (HCC)    Pernicious anemia    PVD (peripheral vascular disease) (HCC)    Shortness of breath    Sickle-cell trait (HCC)    Thoracic aortic aneurysm (HCC)    Tobacco use    URI (upper respiratory infection)       Assessment: 5 YOF with new onset atrial fibrillation and new onset CHF. CHADsVASc score of 5 (age, sex, HTN, HF). Pharmacy consulted for heparin  initiation. Patient started on heparin  5000 units subcutaneously every 8 hours this admission for VTE prophylaxis, last  dose given at 0553 on 12/28. Noted patient is on aspirin  and clopidogrel , for peripheral vascular disease. Patient also has a history of epistaxis requiring visits to the ED.  No anticoagulation prior to admission. Note low weight. Pharmacy consulted for heparin .    Heparin  level 0.32 - therapeutic on heparin  drip 750units/hr.  No issues with infusion or bleeding per RN. Continue until procedures complete then change to po anticoagulation   Goal of Therapy:  Heparin  level 0.3-0.7 units/ml Monitor platelets by anticoagulation protocol: Yes   Plan:  Continue heparin  infusion  750 units/hr  Monitor daily heparin  level, CBC, signs/symptoms of bleeding  F/u long-term anticoagulation plan - when able to tolerate pos and all procedures complete   Olam Chalk Pharm.D. CPP, BCPS Clinical Pharmacist [phone] 05/11/2023 1:42 PM    Please check AMION for all [facility] Pharmacy phone numbers After 10:00 PM, call Main Pharmacy [phone]

## 2023-05-12 DIAGNOSIS — Z515 Encounter for palliative care: Secondary | ICD-10-CM | POA: Diagnosis not present

## 2023-05-12 DIAGNOSIS — I5041 Acute combined systolic (congestive) and diastolic (congestive) heart failure: Secondary | ICD-10-CM

## 2023-05-12 LAB — METHYLMALONIC ACID, SERUM: Methylmalonic Acid, Quantitative: 263 nmol/L (ref 0–378)

## 2023-05-12 NOTE — Plan of Care (Signed)
  Problem: Education: Goal: Knowledge of General Education information will improve Description: Including pain rating scale, medication(s)/side effects and non-pharmacologic comfort measures Outcome: Progressing   Problem: Health Behavior/Discharge Planning: Goal: Ability to manage health-related needs will improve Outcome: Progressing   Problem: Clinical Measurements: Goal: Ability to maintain clinical measurements within normal limits will improve Outcome: Progressing Goal: Will remain free from infection Outcome: Progressing Goal: Diagnostic test results will improve Outcome: Progressing Goal: Respiratory complications will improve Outcome: Progressing Goal: Cardiovascular complication will be avoided Outcome: Progressing   Problem: Activity: Goal: Risk for activity intolerance will decrease Outcome: Progressing   Problem: Nutrition: Goal: Adequate nutrition will be maintained Outcome: Progressing   Problem: Coping: Goal: Level of anxiety will decrease Outcome: Progressing   Problem: Elimination: Goal: Will not experience complications related to bowel motility Outcome: Progressing Goal: Will not experience complications related to urinary retention Outcome: Progressing   Problem: Pain Management: Goal: General experience of comfort will improve Outcome: Progressing   Problem: Safety: Goal: Ability to remain free from injury will improve Outcome: Progressing   Problem: Skin Integrity: Goal: Risk for impaired skin integrity will decrease Outcome: Progressing   Problem: Education: Goal: Ability to demonstrate management of disease process will improve Outcome: Progressing Goal: Ability to verbalize understanding of medication therapies will improve Outcome: Progressing Goal: Individualized Educational Video(s) Outcome: Progressing   Problem: Activity: Goal: Capacity to carry out activities will improve Outcome: Progressing   Problem: Cardiac: Goal:  Ability to achieve and maintain adequate cardiopulmonary perfusion will improve Outcome: Progressing   Problem: Education: Goal: Ability to demonstrate management of disease process will improve Outcome: Progressing Goal: Ability to verbalize understanding of medication therapies will improve Outcome: Progressing Goal: Individualized Educational Video(s) Outcome: Progressing   Problem: Activity: Goal: Capacity to carry out activities will improve Outcome: Progressing   Problem: Cardiac: Goal: Ability to achieve and maintain adequate cardiopulmonary perfusion will improve Outcome: Progressing

## 2023-05-12 NOTE — Progress Notes (Signed)
 PHARMACY - ANTICOAGULATION CONSULT NOTE  Pharmacy Consult for heparin  Indication: atrial fibrillation  Allergies  Allergen Reactions   Lactose Intolerance (Gi) Diarrhea    Patient Measurements: Height: 5' 2 (157.5 cm) Weight: 39.1 kg (86 lb 1.6 oz) IBW/kg (Calculated) : 50.1 Heparin  Dosing Weight: TBW  Vital Signs: Temp: 97.9 F (36.6 C) (01/01 0555) Temp Source: Axillary (01/01 0555) BP: 132/65 (01/01 0555) Pulse Rate: 76 (01/01 0555)  Labs: Recent Labs    05/09/23 1645 05/10/23 0401 05/11/23 0745  HGB  --  8.7* 9.1*  HCT  --  28.5* 29.6*  PLT  --  310 299  HEPARINUNFRC 0.25* 0.35 0.32  CREATININE  --  1.14* 1.15*    Estimated Creatinine Clearance: 23.7 mL/min (A) (by C-G formula based on SCr of 1.15 mg/dL (H)).   Medical History: Past Medical History:  Diagnosis Date   Abdominal bruit    Abnormal echocardiogram    Anemia    Aortic insufficiency    Aortic root dilation (HCC)    Aortic valve disorder    Arthritis    all over (07/22/2012)   Arthropathy    Bilateral carotid artery stenosis    Carotid artery disease (HCC)    Chest discomfort    Edema    Exertional shortness of breath    Fatigue    GERD (gastroesophageal reflux disease)    Gout    Hypercholesteremia    Hypertension    Mitral regurgitation and aortic stenosis    Mixed hyperlipidemia    Murmur, cardiac    Peripheral vascular disease (HCC)    Pernicious anemia    PVD (peripheral vascular disease) (HCC)    Shortness of breath    Sickle-cell trait (HCC)    Thoracic aortic aneurysm (HCC)    Tobacco use    URI (upper respiratory infection)       Assessment: 26 YOF with new onset atrial fibrillation and new onset CHF. CHADsVASc score of 5 (age, sex, HTN, HF). Pharmacy consulted for heparin  initiation. Patient started on heparin  5000 units subcutaneously every 8 hours this admission for VTE prophylaxis, last dose given at 0553 on 12/28. Noted patient is on aspirin  and clopidogrel , for  peripheral vascular disease. Patient also has a history of epistaxis requiring visits to the ED.  No anticoagulation prior to admission. Note low weight. Pharmacy consulted for heparin .    Patient refused lab draws x 2 this AM. Discussed with MD and he is aware and okay with continuing heparin  infusion at this time. Of note, the patient did remove her IV access twice overnight, but access was obtained quickly and heparin  was not paused for a long period of time per RN. No further issues with infusion and no bleeding per RN. Last two heparin  levels were therapeutic, so will continue at current rate and hopefully obtain heparin  level 1/2 AM. Continue until procedures complete then change to PO anticoagulation.   Goal of Therapy:  Heparin  level 0.3-0.7 units/ml Monitor platelets by anticoagulation protocol: Yes   Plan:  Continue heparin  infusion 750 units/hr  Monitor daily heparin  level, CBC, signs/symptoms of bleeding  F/u long-term anticoagulation plan - when able to tolerate POs and all procedures complete  Josefa Range, PharmD PGY1 Pharmacy Resident 05/12/2023 11:50 AM

## 2023-05-12 NOTE — Progress Notes (Addendum)
   Palliative Medicine Inpatient Follow Up Note HPI: [name] with history of aortic insufficiency, peripheral vascular disease, GERD, essential hypertension, pernicious anemia presented to the ED yesterday with multiple symptoms, progressive shortness of breath X 1 week, some swelling, cough and orthopnea, in addition also complained of congestion sinus pain and headaches.    The Palliative care team has been asked to get involved to support additional goals of care conversations, code status, and discuss patients ongoing adult failure to thrive.   Today's Discussion 05/12/2023  *Please note that this is a verbal dictation therefore any spelling or grammatical errors are due to the Dragon Medical One system interpretation.  Chart reviewed inclusive of vital signs, progress notes, laboratory results, and diagnostic images.   I met with patients RN, Roselie this morning. She shares with me that patient had come agitation overnight. She endorses that patient is refusing lab checks and various nursing measures. Discussed delirium precautions.  On assessment, Holly Guerra was resting comfortably in no distress. She was quite sleepy and I did not overtly try to arouse her due to her lack of sleep overnight.   Plan at this time remains for further GI workup.   Questions and concerns addressed/Palliative Support Provided.   Objective Assessment: Vital Signs Vitals:   05/11/23 2031 05/12/23 0555  BP: 107/60 132/65  Pulse: 73 76  Resp: 20 18  Temp: (!) 97.5 F (36.4 C) 97.9 F (36.6 C)  SpO2: 94% 95%    Intake/Output Summary (Last 24 hours) at 05/12/2023 0847 Last data filed at 05/12/2023 9185 Gross per 24 hour  Intake 560.7 ml  Output --  Net 560.7 ml   Last Weight  Most recent update: 05/12/2023  6:27 AM    Weight  39.1 kg (86 lb 1.6 oz)            Gen:  Elderly AA F chronically ill appearing HEENT: moist mucous membranes CV: Regular rate and irregular rhythm  PULM:  On RA, breathing is  even and nonlabored ABD: soft/nontender  EXT: No edema  Neuro: Alert and oriented to person and place, thinks she came in for sinus congestion  SUMMARY OF RECOMMENDATIONS   Full Code at this time  [facility] has spoken to patients daughter to explain Palliative versus hospice services  Additional decisions to be made once GI evaluation is complete  Initiate delirium precautions   Ongoing incremental PMT support  Billing based on MDM: Low ______________________________________________________________________________________ [name] [place] Palliative Medicine Team Team Cell Phone: [phone] Please utilize secure chat with additional questions, if there is no response within 30 minutes please call the above phone number  Palliative Medicine Team providers are available by phone from 7am to 7pm daily and can be reached through the team cell phone.  Should this patient require assistance outside of these hours, please call the patient's attending physician.

## 2023-05-12 NOTE — Progress Notes (Signed)
 Pt refuses phlebotomist to get lab work this am. We will hand off to the day shift team . The phlebotomist team will try again at am.   Filiberto Pinks, RN

## 2023-05-12 NOTE — Progress Notes (Signed)
 PROGRESS NOTE    Holly Guerra  FMW:[id number] DOB: 1940-06-05 DOA: 05/03/2023 PCP: Elvera Ozell SAUNDERS, PA-C   Brief Narrative:  Holly Guerra was admitted to the hospital with the working diagnosis of acute heart failure in the setting of influenza A infection.    [name] with history of aortic insufficiency, peripheral vascular disease, GERD, essential hypertension, pernicious anemia presented to the ED yesterday with multiple symptoms, progressive shortness of breath X 1 week, some swelling, cough and orthopnea, in addition also complained of congestion sinus pain and headaches. -In the ED she was noted to be mildly hypoxic, BNP 4119, respiratory virus panel positive for influenza A, hemoglobin 9.2, creatinine 1.03,    Chest radiograph with cardiomegaly with bilateral hilar vascular congestion and cephalization of the vasculature.    EKG 89 bpm, left axis deviation, left anterior fascicular block, qtc 508, sinus rhythm with poor R R wave progression, no significant ST segment changes and negative T wave V5 and V6.   Assessment & Plan:   Principal Problem:   Acute heart failure (HCC) Active Problems:   Influenza with respiratory manifestation   Essential hypertension   AKI (acute kidney injury) (HCC)   Pernicious anemia   GERD (gastroesophageal reflux disease)   PVD (peripheral vascular disease) (HCC)   Protein-calorie malnutrition, severe   Dysphagia   Abnormal barium swallow   Antiplatelet or antithrombotic long-term use   Loss of weight  Failure to Thrive / Weight Loss  Poor PO Intake / Odynophagia/esophageal stricture/ Glossitis  Concern for possible oropharyngeal candidiasis Apparently she has greyish/brown coating on tongue as well as ulcerations with grey slough.  Not classic appearance for thrush, but treating empirically for oropharyngeal and esophageal candidiasis with itraconazole  with her pain/discomfort (no fluconazole  due to plavix ). 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 GI consulted, appreciate assistance -> full liquid diet, PPI BID, carafate , itraconazole  - hold plavix  for possible EGD which is scheduled for Saturday. B12 wnl. CT abdomen/pelvis without acute abnormality (limited eval due to motion and streak artifact) RD previously recommended cortrak, daughter Holly Guerra wasn't interested with concern she may pull this.  Will avoid for now.  Will see how she does with above measures.   Atrial Fibrillation with RVR Back in sinus, on oral metoprolol  and IV heparin  for now.   Acute Metabolic Encephalopathy Dysarthria  Per discussion with daughter, waxing/waning encephalopathy, dysarthria for the past week. Likely due to influenza infection. head CT -> progressive atrophy and white matter disease, advanced for age High dose thiamine .  Patient does appear to be alert and oriented however she is quite irritable and does not want to talk much. Delirium precautions.   Indeterminate Splenic Lesion Consider postcontrast CT vs MR    New onset of congestive heart failure (HCC) Echo with EF 45-50%, regional wall motion abnormalities, grade II diastolic dysfunction, moderate AS  Spironolactone , jardiance  Lasix     Influenza A with pneumonia No signs of bacterial superinfection.  Completed oseltamivir  for antiviral therapy.    Regional Wall Motion Abnormality Plan for outpatient cardiology follow up    Right Foot Pain  Peripheral Vascular Disease Hx L SFA stent  Related to peripheral vascular disease?  Palpable pulse Will monitor for now.  She had no complaints today.   AKI (acute kidney injury) (HCC) ruled out.  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  Last known normal creatinine record was more than 47 months old.   Hypokalemia.   Resolved   Essential hypertension Patient is not on amlodipine  and nebevilol as mentioned previously.  Patient is on metoprolol  and blood pressure is controlled.   Pernicious anemia Cell count has been stable.  Patient on B12 injections q 30 days.    Iron deficiency anemia, continue with oral iron supplementation.    GERD (gastroesophageal reflux disease) Continue with pantoprazole .    PVD (peripheral vascular disease) (HCC) Continue blood pressure control  Patient on aspirin , and clopidogrel  (on hold) Hold cilostazol  in the setting of heart failure.   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   Status is: Inpatient Remains inpatient appropriate because: Scheduled for EGD and possible esophageal dilatation on Saturday, 05/16/2023.   Estimated body mass index is 15.75 kg/m as calculated from the following:   Height as of this encounter: 5' 2 (1.575 m).   Weight as of this encounter: 39.1 kg.    Nutritional Assessment: Body mass index is 15.75 kg/m.[name] Seen by dietician.  I agree with the assessment and plan as outlined below: Nutrition Status: Nutrition Problem: Severe Malnutrition Etiology: chronic illness Signs/Symptoms: severe muscle depletion, severe fat depletion Interventions: Refer to RD note for recommendations  . Skin Assessment: I have examined the patient's skin and I agree with the wound assessment as performed by the wound care RN as outlined below:    Consultants:  GI   Procedures:  As above  Antimicrobials:  Anti-infectives (From admission, onward)    Start     Dose/Rate Route Frequency Ordered Stop   05/09/23 1800  itraconazole  (SPORANOX ) 10 MG/ML solution 200 mg  Status:  Discontinued        200 mg Oral Every 24 hours 05/09/23 1616 05/09/23 1624   05/09/23 1800   itraconazole  (SPORANOX ) capsule 200 mg        200 mg Oral Every 24 hours 05/09/23 1624     05/04/23 0100  oseltamivir  (TAMIFLU ) capsule 30 mg        30 mg Oral Daily 05/04/23 0004 05/08/23 0835         Subjective: Patient seen and examined.  Patient is quite irritable today.  In the beginning, she was not willing to talk or answer any other questions until I tried 4 times.  Then she told me that she had headache but would not elaborate further.  She was reluctant to let me examine and did not allow me to listen to her lungs.  I was told that she also refused labs this morning.  Objective: Vitals:   05/11/23 0526 05/11/23 1600 05/11/23 2031 05/12/23 0555  BP: 126/82  107/60 132/65  Pulse: 86  73 76  Resp: 20 18 20 18   Temp: 97.8 F (36.6 C) 97.7 F (36.5 C) (!) 97.5 F (36.4 C) 97.9 F (36.6 C)  TempSrc: Oral Oral Oral Axillary  SpO2: 94%  94% 95%  Weight:    39.1 kg  Height:        Intake/Output Summary (Last 24 hours) at 05/12/2023 0939 Last data filed at 05/12/2023 0814 Gross per 24 hour  Intake 560.7 ml  Output --  Net 560.7 ml   Filed Weights   05/10/23 0405 05/11/23 0500 05/12/23 0555  Weight: 39.7 kg 38.6  kg 39.1 kg    Examination:  General exam: Appears calm and comfortable with mild dysarthria Gastrointestinal system: Abdomen is nondistended, soft and nontender. No organomegaly or masses felt. Normal bowel sounds heard. Central nervous system: Alert but unable to assess orientation.  Unable to complete thorough neurological exam as she is not willing to follow commands.   Data Reviewed: I have personally reviewed following labs and imaging studies  CBC: Recent Labs  Lab 05/07/23 0506 05/08/23 0326 05/09/23 0314 05/09/23 0849 05/10/23 0401 05/11/23 0745  WBC 12.1* 10.3 8.7  --  6.2 5.3  NEUTROABS 10.0* 9.0*  --   --  4.1 3.9  HGB 10.5* 9.5* 8.7* 8.7* 8.7* 9.1*  HCT 34.0* 30.9* 28.0* 28.2* 28.5* 29.6*  MCV 68.0* 67.2* 66.7*  --  67.4* 67.0*  PLT 316  333 310  --  310 299   Basic Metabolic Panel: Recent Labs  Lab 05/06/23 0409 05/07/23 0506 05/08/23 0326 05/09/23 0314 05/10/23 0401 05/11/23 0745  NA 144 143 146* 136 135 139  K 3.8 3.9 3.3* 4.0 4.2 3.9  CL 108 107 108 102 101 103  CO2 23 25 26 25 24 27   GLUCOSE 103* 91 79 131* 94 90  BUN 24* 21 16 14 11 10   CREATININE 1.31* 1.32* 1.12* 1.14* 1.14* 1.15*  CALCIUM  9.2 9.5 9.3 9.0 9.1 9.1  MG 2.8* 2.3 2.0  --  1.8 1.9  PHOS  --  3.1 2.8  --  3.0 2.8   GFR: Estimated Creatinine Clearance: 23.7 mL/min (A) (by C-G formula based on SCr of 1.15 mg/dL (H)). Liver Function Tests: Recent Labs  Lab 05/07/23 0506 05/08/23 0326 05/10/23 0401 05/11/23 0745  AST 56* 43* 38 38  ALT 39 33 34 31  ALKPHOS 56 56 56 56  BILITOT 0.9 1.1 0.6 0.8  PROT 7.5 7.0 6.7 6.8  ALBUMIN 3.4* 2.9* 2.7* 2.7*   No results for input(s): LIPASE, AMYLASE in the last 168 hours. No results for input(s): AMMONIA in the last 168 hours. Coagulation Profile: No results for input(s): INR, PROTIME in the last 168 hours. Cardiac Enzymes: No results for input(s): CKTOTAL, CKMB, CKMBINDEX, TROPONINI in the last 168 hours. BNP (last 3 results) No results for input(s): PROBNP in the last 8760 hours. HbA1C: No results for input(s): HGBA1C in the last 72 hours. CBG: No results for input(s): GLUCAP in the last 168 hours. Lipid Profile: No results for input(s): CHOL, HDL, LDLCALC, TRIG, CHOLHDL, LDLDIRECT in the last 72 hours. Thyroid  Function Tests: No results for input(s): TSH, T4TOTAL, FREET4, T3FREE, THYROIDAB in the last 72 hours. Anemia Panel: Recent Labs    05/10/23 0401  VITAMINB12 3,174*   Sepsis Labs: No results for input(s): PROCALCITON, LATICACIDVEN in the last 168 hours.  Recent Results (from the past 240 hours)  Resp panel by RT-PCR (RSV, Flu A&B, Covid) Anterior Nasal Swab     Status: Abnormal   Collection Time: 05/03/23  3:57 PM   Specimen:  Anterior Nasal Swab  Result Value Ref Range Status   SARS Coronavirus 2 by RT PCR NEGATIVE NEGATIVE Final    Comment: (NOTE) SARS-CoV-2 target nucleic acids are NOT DETECTED.  The SARS-CoV-2 RNA is generally detectable in upper respiratory specimens during the acute phase of infection. The lowest concentration of SARS-CoV-2 viral copies this assay can detect is 138 copies/mL. A negative result does not preclude SARS-Cov-2 infection and should not be used as the sole basis for treatment or other patient management decisions. A negative  result may occur with  improper specimen collection/handling, submission of specimen other than nasopharyngeal swab, presence of viral mutation(s) within the areas targeted by this assay, and inadequate number of viral copies(<138 copies/mL). A negative result must be combined with clinical observations, patient history, and epidemiological information. The expected result is Negative.  Fact Sheet for Patients:  bloggercourse.com  Fact Sheet for Healthcare Providers:  seriousbroker.it  This test is no t yet approved or cleared by the [place]  FDA and  has been authorized for detection and/or diagnosis of SARS-CoV-2 by FDA under an Emergency Use Authorization (EUA). This EUA will remain  in effect (meaning this test can be used) for the duration of the COVID-19 declaration under Section 564(b)(1) of the Act, 21 U.S.C.section 360bbb-3(b)(1), unless the authorization is terminated  or revoked sooner.       Influenza A by PCR POSITIVE (A) NEGATIVE Final   Influenza B by PCR NEGATIVE NEGATIVE Final    Comment: (NOTE) The Xpert Xpress SARS-CoV-2/FLU/RSV plus assay is intended as an aid in the diagnosis of influenza from Nasopharyngeal swab specimens and should not be used as a sole basis for treatment. Nasal washings and aspirates are unacceptable for Xpert Xpress  SARS-CoV-2/FLU/RSV testing.  Fact Sheet for Patients: bloggercourse.com  Fact Sheet for Healthcare Providers: seriousbroker.it  This test is not yet approved or cleared by the [place]  FDA and has been authorized for detection and/or diagnosis of SARS-CoV-2 by FDA under an Emergency Use Authorization (EUA). This EUA will remain in effect (meaning this test can be used) for the duration of the COVID-19 declaration under Section 564(b)(1) of the Act, 21 U.S.C. section 360bbb-3(b)(1), unless the authorization is terminated or revoked.     Resp Syncytial Virus by PCR NEGATIVE NEGATIVE Final    Comment: (NOTE) Fact Sheet for Patients: bloggercourse.com  Fact Sheet for Healthcare Providers: seriousbroker.it  This test is not yet approved or cleared by the [place]  FDA and has been authorized for detection and/or diagnosis of SARS-CoV-2 by FDA under an Emergency Use Authorization (EUA). This EUA will remain in effect (meaning this test can be used) for the duration of the COVID-19 declaration under Section 564(b)(1) of the Act, 21 U.S.C. section 360bbb-3(b)(1), unless the authorization is terminated or revoked.  Performed at [facility], [address] [place], [place] [address]      Radiology Studies: DG ESOPHAGUS W SINGLE CM (SOL OR THIN BA) Result Date: 05/10/2023 CLINICAL DATA:  Weight loss. Poor oral intake. Patient also reports globus sensation in the proximal esophagus. Request for barium esophagram. EXAM: ESOPHAGUS/BARIUM SWALLOW/TABLET STUDY TECHNIQUE: Single contrast examination was performed using thin liquid barium. This exam was performed by Franky Rusk PA-C , and was supervised and interpreted by Dr. Marcey Moan . FLUOROSCOPY: Radiation Exposure Index (as provided by the fluoroscopic device): 14.90 mGy Kerma COMPARISON:  CT chest/abdomen  05/08/2023 FINDINGS: Swallowing: Appears normal. No vestibular penetration or aspiration seen. Pharynx: Unremarkable. Esophagus: Suggestion of moderate focal narrowing in the proximal esophagus. 13 mm tablet does not advanced beyond this level. Remaining esophagus with irregular and thickened mucosal folds concerning for esophagitis. Distal esophagus also with long segment of smooth mild to moderate stricture. Esophageal motility: Limited evaluation of motility as patient could not lie prone. However, residual contrast stasis in the esophagus suggest some degree of dysmotility. Hiatal Hernia: None seen. Gastroesophageal reflux: None visualized. IMPRESSION: No evidence of aspiration or vestibular penetration. Moderate focal narrowing in the proximal esophagus that results in 13  mm tablet becoming lodged at this level. Irregular and thickened esophageal mucosa concerning for esophagitis. 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 Consider correlation with EGD. Electronically Signed   By: Marcey Moan M.D.   On: 05/10/2023 16:23    Scheduled Meds:  allopurinol   100 mg Oral QPM   aspirin  EC  81 mg Oral Daily   cycloSPORINE   1 drop Both Eyes BID   diclofenac  Sodium  2 g Topical QID   docusate sodium   100 mg Oral BID   feeding supplement  237 mL Oral TID BM   ferrous sulfate   325 mg Oral Daily   itraconazole   200 mg Oral Q24H   metoprolol  tartrate  12.5 mg Oral BID   multivitamin with minerals  1 tablet Oral Daily   pantoprazole  (PROTONIX ) IV  40 mg Intravenous Q12H   sucralfate   1 g Oral Q8H   traZODone   50 mg Oral QHS   Continuous Infusions:  heparin  750 Units/hr (05/12/23 0814)   thiamine  (VITAMIN B1) injection Stopped (05/11/23 0911)     LOS: 8 days   Fredia Skeeter, MD [facility]  05/12/2023, 9:39 AM   *Please note that this is a verbal dictation therefore any spelling or grammatical errors are due to the Dragon Medical One system  interpretation.  Please page via Amion and do not message via secure chat for urgent patient care matters. Secure chat can be used for non urgent patient care matters.  How to contact the [facility] Attending or Consulting provider 7A - 7P or covering provider during after hours 7P -7A, for this patient?  Check the care team in [facility] and look for a) attending/consulting [facility] provider listed and b) the [facility] team listed. Page or secure chat 7A-7P. Log into [email or web address] and use [facility] universal password to access. If you do not have the password, please contact the hospital operator. Locate the [facility] provider you are looking for under [facility] and page to a number that you can be directly reached. If you still have difficulty reaching the provider, please page the [facility] (Director on Call) for the Hospitalists listed on amion for assistance.

## 2023-05-12 NOTE — Progress Notes (Signed)
 Speech Language Pathology Treatment: Dysphagia  Patient Details Name: Holly Guerra MRN: [id number] DOB: 06/08/1940 Today's Date: 05/12/2023 Time: 8959-8946 SLP Time Calculation (min) (ACUTE ONLY): 13 min  Assessment / Plan / Recommendation Clinical Impression  F/u after initial bedside swallow evaluation on 12/29. Pt has subsequently been seen by GI, has had a barium swallow (findings below in HPI), and is now on a full liquid diet pending EGD on Saturday. Today, she accepted sips of thin liquids and several bites of pudding. She continues with hesitancy to eat/drink due to discomfort. Again, there were no signs of oropharyngeal involvement. Reiterated findings from barium swallow and the potential for dilation to improve her comfort. She seemed equivocal.   There are no further issues requiring SLP f/u. Our service will sign off.   HPI HPI: Holly Guerra is an 83 yo female presenting to ED 12/23 with SOB. Admitted with acute heart failure in the setting of influenza A infection. GI consulted; 12/30  Barium study shows evidence of a proximal esophageal stricture where barium tablet got lodged.  There are nonspecific changes of the mucosa concerning for esophagitis and some narrowing of the distal esophagus as well.  There is pathology there that could correlate with her dysphagia. She has odynophagia and ulcerations on her tongue of unclear etiology.  GI rec full liquid diet and EGD on Sat 1/4. PMH includes aortic valve disease, PVD s/p stenting 07/2012, GERD, pernicious anemia, essential HTN      SLP Plan  All goals met      Recommendations for follow up therapy are one component of a multi-disciplinary discharge planning process, led by the attending physician.  Recommendations may be updated based on patient status, additional functional criteria and insurance authorization.    Recommendations  Diet recommendations: Other(comment) (per GI - full liquids) Medication Administration: Whole meds  with puree Supervision: Patient able to self feed Postural Changes and/or Swallow Maneuvers: Seated upright 90 degrees;Upright 30-60 min after meal                  Oral care BID     Dysphagia, unspecified (R13.10)     All goals met    Holly Guerra L. Vona, MA CCC/SLP Clinical Specialist - Acute Care SLP Acute Rehabilitation Services Office number [phone]  Holly Guerra  05/12/2023, 11:02 AM

## 2023-05-12 NOTE — Consult Note (Signed)
 Reason for Consult:tongue ulcer Referring Physician: Dr Vernon Dickey Holly is an 83 y.o. Guerra.  HPI: hx of ulcer in mouth but patient has no idea how long. She does not complain of pain. She does claim to have bit the area previousl. No rashes or lesions elsewhere. Poor dentition  Past Medical History:  Diagnosis Date   Abdominal bruit    Abnormal echocardiogram    Anemia    Aortic insufficiency    Aortic root dilation (HCC)    Aortic valve disorder    Arthritis    all over (07/22/2012)   Arthropathy    Bilateral carotid artery stenosis    Carotid artery disease (HCC)    Chest discomfort    Edema    Exertional shortness of breath    Fatigue    GERD (gastroesophageal reflux disease)    Gout    Hypercholesteremia    Hypertension    Mitral regurgitation and aortic stenosis    Mixed hyperlipidemia    Murmur, cardiac    Peripheral vascular disease (HCC)    Pernicious anemia    PVD (peripheral vascular disease) (HCC)    Shortness of breath    Sickle-cell trait (HCC)    Thoracic aortic aneurysm (HCC)    Tobacco use    URI (upper respiratory infection)     Past Surgical History:  Procedure Laterality Date   ABDOMINAL AORTOGRAM W/LOWER EXTREMITY Bilateral 05/23/2020   Procedure: ABDOMINAL AORTOGRAM W/LOWER EXTREMITY;  Surgeon: Court Dorn PARAS, MD;  Location: [facility] INVASIVE CV LAB;  Service: Cardiovascular;  Laterality: Bilateral;   ABDOMINAL HYSTERECTOMY  1970's   APPENDECTOMY  1970's   CARDIOVASCULAR STRESS TEST  06/16/2012   normal myocardial perfusion imaging, LV systolic function was normal without regional wall motion abnormalities, LV EF 60%   DOPPLER ECHOCARDIOGRAPHY  06/16/2012   EF 60-65%, mild concetric LVH, moderate aortic regurg   LOWER EXTREMITY ANGIOGRAM N/A 07/22/2012   Procedure: LOWER EXTREMITY ANGIOGRAM;  Surgeon: Dorn PARAS Court, MD;  Location: [facility] CATH LAB;  Service: Cardiovascular;  Laterality: N/A;   LOWER EXTREMITY ARTERIAL DOPPLER  08/08/2012   Left SFA  stent-open and patent without evidence of hemodynamically significant stenosis   PERCUTANEOUS STENT INTERVENTION Left 07/22/2012   TOTAL THYROIDECTOMY  ~ 2010    Family History  Problem Relation Age of Onset   Hypertension Mother    Heart disease Mother     Social History:  reports that she has been smoking cigarettes. She started smoking about 61 years ago. She has a 22.5 pack-year smoking history. She has never used smokeless tobacco. She reports current alcohol use. She reports that she does not use drugs.  Allergies:  Allergies  Allergen Reactions   Lactose Intolerance (Gi) Diarrhea    Medications: I have reviewed the patient's current medications.  Results for orders placed or performed during the hospital encounter of 05/03/23 (from the past 48 hours)  Heparin  level (unfractionated)     Status: None   Collection Time: 05/11/23  7:45 AM  Result Value Ref Range   Heparin  Unfractionated 0.32 0.30 - 0.70 IU/mL    Comment: (NOTE) The clinical reportable range upper limit is being lowered to >1.10 to align with the FDA approved guidance for the current laboratory assay.  If heparin  results are below expected values, and patient dosage has  been confirmed, suggest follow up testing of antithrombin III levels. Performed at [facility], [address] [place], [place] [address]   CBC with Differential/Platelet  Status: Abnormal   Collection Time: 05/11/23  7:45 AM  Result Value Ref Range   WBC 5.3 4.0 - 10.5 K/uL   RBC 4.42 3.87 - 5.11 MIL/uL   Hemoglobin 9.1 (L) 12.0 - 15.0 g/dL   HCT 70.3 (L) 63.9 - 53.9 %   MCV 67.0 (L) 80.0 - 100.0 fL   MCH 20.6 (L) 26.0 - 34.0 pg   MCHC 30.7 30.0 - 36.0 g/dL   RDW 80.2 (H) 88.4 - 84.4 %   Platelets 299 150 - 400 K/uL    Comment: REPEATED TO VERIFY   nRBC 0.0 0.0 - 0.2 %   Neutrophils Relative % 74 %   Neutro Abs 3.9 1.7 - 7.7 K/uL   Lymphocytes Relative 9 %   Lymphs Abs 0.5 (L) 0.7 - 4.0 K/uL   Monocytes Relative 13 %    Monocytes Absolute 0.7 0.1 - 1.0 K/uL   Eosinophils Relative 1 %   Eosinophils Absolute 0.1 0.0 - 0.5 K/uL   Basophils Relative 3 %   Basophils Absolute 0.2 (H) 0.0 - 0.1 K/uL   WBC Morphology See Note     Comment: Hypersegmented Neutrophils   Smear Review See Note     Comment: Normal Platelet Morphology   nRBC 1 (H) 0 /100 WBC   Abs Immature Granulocytes 0.00 0.00 - 0.07 K/uL   Polychromasia PRESENT    Target Cells PRESENT     Comment: Performed at [facility], [address], [place], [place] [address]  Comprehensive metabolic panel     Status: Abnormal   Collection Time: 05/11/23  7:45 AM  Result Value Ref Range   Sodium 139 135 - 145 mmol/L   Potassium 3.9 3.5 - 5.1 mmol/L   Chloride 103 98 - 111 mmol/L   CO2 27 22 - 32 mmol/L   Glucose, Bld 90 70 - 99 mg/dL    Comment: Glucose reference range applies only to samples taken after fasting for at least 8 hours.   BUN 10 8 - 23 mg/dL   Creatinine, Ser 8.84 (H) 0.44 - 1.00 mg/dL   Calcium  9.1 8.9 - 10.3 mg/dL   Total Protein 6.8 6.5 - 8.1 g/dL   Albumin 2.7 (L) 3.5 - 5.0 g/dL   AST 38 15 - 41 U/L   ALT 31 0 - 44 U/L   Alkaline Phosphatase 56 38 - 126 U/L   Total Bilirubin 0.8 0.0 - 1.2 mg/dL   GFR, Estimated 48 (L) >60 mL/min    Comment: (NOTE) Calculated using the CKD-EPI Creatinine Equation (2021)    Anion gap 9 5 - 15    Comment: Performed at [facility], [address], [place], [place] [address]  Phosphorus     Status: None   Collection Time: 05/11/23  7:45 AM  Result Value Ref Range   Phosphorus 2.8 2.5 - 4.6 mg/dL    Comment: Performed at [facility], [address] [place], [place] [address]  Magnesium      Status: None   Collection Time: 05/11/23  7:45 AM  Result Value Ref Range   Magnesium  1.9 1.7 - 2.4 mg/dL    Comment: Performed at [facility], [address], [place], [place] [address]    No results found.  ROS Blood pressure 132/65, pulse 76, temperature 97.9 F (36.6  C), temperature source Axillary, resp. rate 18, height 5' 2 (1.575 m), weight 39.1 kg, SpO2 95%. Physical Exam HENT:  Right Ear: External ear normal.     Left Ear: External ear normal.     Nose: Nose normal.     Mouth/Throat:     Comments: Has a coating on tongue and buccal mucosa. There is a 2 cm ulcer on the right anterior tongue with exudative base. It does not has the appearance of neoplasm. There is a smaller one on the dorsal area with same appearance. The ulcer on right looks like it rubs on the bad solo tooth. Poor dentition.  Neurological:     Mental Status: She is alert.       Assessment/Plan: Stomatitis- she has a larger ulcer on the right anterior tongue and another smaller on the dorsal tongue. She claims to bit the area. She has poor dentition. It is unclear the etiology especially since she can give no real history. They do not hurt her and has no idea how long present. Trauma or fungal can be source. Ulcer in mouth otherwise are almost always a systemic issue like stress, vitiamin def(b12,folate,vit d) medication side effects or other systemic diseases conditions. Try antifungal first. Some topical kenalog  can help and if they persist for few weeks then biopsy and further lab work should be done.   Norleen Notice 05/12/2023, 11:33 AM

## 2023-05-13 DIAGNOSIS — I5041 Acute combined systolic (congestive) and diastolic (congestive) heart failure: Secondary | ICD-10-CM | POA: Diagnosis not present

## 2023-05-13 LAB — COMPREHENSIVE METABOLIC PANEL
ALT: 30 U/L (ref 0–44)
AST: 28 U/L (ref 15–41)
Albumin: 2.8 g/dL — ABNORMAL LOW (ref 3.5–5.0)
Alkaline Phosphatase: 55 U/L (ref 38–126)
Anion gap: 10 (ref 5–15)
BUN: 8 mg/dL (ref 8–23)
CO2: 26 mmol/L (ref 22–32)
Calcium: 9.3 mg/dL (ref 8.9–10.3)
Chloride: 104 mmol/L (ref 98–111)
Creatinine, Ser: 1.07 mg/dL — ABNORMAL HIGH (ref 0.44–1.00)
GFR, Estimated: 52 mL/min — ABNORMAL LOW (ref >60)
Glucose, Bld: 142 mg/dL — ABNORMAL HIGH (ref 70–99)
Potassium: 3.8 mmol/L (ref 3.5–5.1)
Sodium: 140 mmol/L (ref 135–145)
Total Bilirubin: 0.4 mg/dL (ref 0.0–1.2)
Total Protein: 6.7 g/dL (ref 6.5–8.1)

## 2023-05-13 LAB — CBC WITH DIFFERENTIAL/PLATELET
Abs Immature Granulocytes: 0.02 10*3/uL (ref 0.00–0.07)
Basophils Absolute: 0 10*3/uL (ref 0.0–0.1)
Basophils Relative: 1 %
Eosinophils Absolute: 0 10*3/uL (ref 0.0–0.5)
Eosinophils Relative: 1 %
HCT: 30.2 % — ABNORMAL LOW (ref 36.0–46.0)
Hemoglobin: 9.3 g/dL — ABNORMAL LOW (ref 12.0–15.0)
Immature Granulocytes: 0 %
Lymphocytes Relative: 19 %
Lymphs Abs: 1 10*3/uL (ref 0.7–4.0)
MCH: 20.8 pg — ABNORMAL LOW (ref 26.0–34.0)
MCHC: 30.8 g/dL (ref 30.0–36.0)
MCV: 67.6 fL — ABNORMAL LOW (ref 80.0–100.0)
Monocytes Absolute: 0.8 10*3/uL (ref 0.1–1.0)
Monocytes Relative: 15 %
Neutro Abs: 3.2 10*3/uL (ref 1.7–7.7)
Neutrophils Relative %: 64 %
Platelets: 268 10*3/uL (ref 150–400)
RBC: 4.47 MIL/uL (ref 3.87–5.11)
RDW: 19.8 % — ABNORMAL HIGH (ref 11.5–15.5)
WBC: 5.1 10*3/uL (ref 4.0–10.5)
nRBC: 0 % (ref 0.0–0.2)

## 2023-05-13 LAB — PHOSPHORUS: Phosphorus: 2.5 mg/dL (ref 2.5–4.6)

## 2023-05-13 LAB — MAGNESIUM: Magnesium: 2 mg/dL (ref 1.7–2.4)

## 2023-05-13 LAB — HEPARIN LEVEL (UNFRACTIONATED): Heparin Unfractionated: 0.34 [IU]/mL (ref 0.30–0.70)

## 2023-05-13 MED ORDER — HALOPERIDOL LACTATE 5 MG/ML IJ SOLN
5.0000 mg | Freq: Four times a day (QID) | INTRAMUSCULAR | Status: DC | PRN
  Administered 2023-05-15 – 2023-05-16 (×2): 5 mg via INTRAVENOUS
  Filled 2023-05-13 (×2): qty 1

## 2023-05-13 MED ORDER — TRIAMCINOLONE ACETONIDE 0.1 % MT PSTE
PASTE | Freq: Three times a day (TID) | OROMUCOSAL | Status: AC
Start: 2023-05-13 — End: ?
  Administered 2023-05-18: 1 via OROMUCOSAL
  Filled 2023-05-13 (×3): qty 5

## 2023-05-13 NOTE — Progress Notes (Signed)
 PROGRESS NOTE    Holly Guerra  FMW:[id number] DOB: 11-23-1940 DOA: 05/03/2023 PCP: Elvera Ozell SAUNDERS, PA-C   Brief Narrative:  Holly Guerra was admitted to the hospital with the working diagnosis of acute heart failure in the setting of influenza A infection.    [name] with history of aortic insufficiency, peripheral vascular disease, GERD, essential hypertension, pernicious anemia presented to the ED yesterday with multiple symptoms, progressive shortness of breath X 1 week, some swelling, cough and orthopnea, in addition also complained of congestion sinus pain and headaches. -In the ED she was noted to be mildly hypoxic, BNP 4119, respiratory virus panel positive for influenza A, hemoglobin 9.2, creatinine 1.03,    Chest radiograph with cardiomegaly with bilateral hilar vascular congestion and cephalization of the vasculature.    EKG 89 bpm, left axis deviation, left anterior fascicular block, qtc 508, sinus rhythm with poor R R wave progression, no significant ST segment changes and negative T wave V5 and V6.   Assessment & Plan:   Principal Problem:   Acute heart failure (HCC) Active Problems:   Influenza with respiratory manifestation   Essential hypertension   AKI (acute kidney injury) (HCC)   Pernicious anemia   GERD (gastroesophageal reflux disease)   PVD (peripheral vascular disease) (HCC)   Protein-calorie malnutrition, severe   Dysphagia   Abnormal barium swallow   Antiplatelet or antithrombotic long-term use   Loss of weight  Failure to Thrive / Weight Loss  Poor PO Intake / Odynophagia/esophageal stricture/ Glossitis  Concern for possible oropharyngeal candidiasis Apparently she has greyish/brown coating on tongue as well as ulcerations with grey slough.  Not classic appearance for thrush, but treating empirically for oropharyngeal and esophageal candidiasis with itraconazole  with her pain/discomfort (no fluconazole  due to plavix ). 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 GI consulted, appreciate assistance -> full liquid diet, PPI BID, carafate , itraconazole  - hold plavix  for possible EGD which is scheduled for Saturday. B12 wnl. CT abdomen/pelvis without acute abnormality (limited eval due to motion and streak artifact) RD previously recommended cortrak, daughter [name] wasn't interested with concern she may pull this.  Will avoid for now.  Will see how she does with above measures. Also, seen by Dr. Roark of ENT for stomatitis on 05/12/2023, per him, it was unclear of the etiology of stomatitis but systemic issues likely stress and vitamin deficiency may be the cause.  He recommended trying antifungal first, patient already is on itraconazole  however I have ordered topical Kenalog  per his recommendations.   Atrial Fibrillation with RVR Back in sinus, on oral metoprolol  and IV heparin  for now.   Acute Metabolic Encephalopathy Dysarthria  Per discussion with daughter, waxing/waning encephalopathy, dysarthria for the past week. Likely due to influenza infection. head CT -> progressive atrophy and white matter disease, advanced for age High dose thiamine .  Patient is fully alert and oriented and she is in better mood today.  Indeterminate Splenic Lesion Consider postcontrast CT vs MR    New onset of congestive heart failure (HCC) Echo with EF 45-50%, regional wall motion abnormalities, grade II diastolic dysfunction, moderate AS  Spironolactone , jardiance  Lasix     Influenza A with pneumonia No signs of bacterial superinfection.  Completed oseltamivir  for antiviral therapy.    Regional Wall Motion Abnormality Plan for outpatient cardiology follow up    Right Foot Pain  Peripheral Vascular Disease Hx L SFA stent  Related to peripheral vascular disease?  Palpable pulse Will monitor for now.  She had no complaints today.   AKI  (acute kidney injury) (HCC) ruled out.  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  Last known normal creatinine record was more than 32 months old.   Hypokalemia.  Resolved   Essential hypertension Patient is on metoprolol  and blood pressure is controlled.   Pernicious anemia Cell count has been stable.  Patient on B12 injections q 30 days.    Iron deficiency anemia, continue with oral iron supplementation.    GERD (gastroesophageal reflux disease) Continue with pantoprazole .    PVD (peripheral vascular disease) (HCC) Continue blood pressure control  Patient on aspirin , and clopidogrel  (on hold) Hold cilostazol  in the setting of heart failure.   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   Status is: Inpatient Remains inpatient appropriate because: Scheduled for EGD and possible esophageal dilatation on Saturday, 05/16/2023.   Estimated body mass index is 15.8 kg/m as calculated from the following:   Height as of this encounter: 5' 2 (1.575 m).   Weight as of this encounter: 39.2 kg.    Nutritional Assessment: Body mass index is 15.8 kg/m.Holly Guerra Seen by dietician.  I agree with the assessment and plan as outlined below: Nutrition Status: Nutrition Problem: Severe Malnutrition Etiology: chronic illness Signs/Symptoms: severe muscle depletion, severe fat depletion Interventions: Refer to RD note for recommendations  . Skin Assessment: I have examined the patient's skin and I agree with the wound assessment as performed by the wound care RN as outlined below:    Consultants:  GI   Procedures:  As above  Antimicrobials:  Anti-infectives (From admission, onward)    Start     Dose/Rate Route  Frequency Ordered Stop   05/09/23 1800  itraconazole  (SPORANOX ) 10 MG/ML solution 200 mg  Status:  Discontinued        200 mg Oral Every 24 hours 05/09/23 1616 05/09/23 1624   05/09/23 1800  itraconazole  (SPORANOX ) capsule 200 mg        200 mg Oral Every 24 hours 05/09/23 1624     05/04/23 0100  oseltamivir  (TAMIFLU ) capsule 30 mg        30 mg Oral Daily 05/04/23 0004 05/08/23 0835         Subjective: Patient seen and examined.  She is in much better mood today.  Smiling and talking.  She denies any tongue pain but complains of the right heel pain.  I examined her right foot which did not appear to have any obvious cause for the pain.  Objective: Vitals:   05/13/23 0307 05/13/23 0731 05/13/23 0839 05/13/23 1304  BP: (!) 143/81 126/72 126/72 (!) 141/77  Pulse: 79 75 75   Resp: 18   (!) 22  Temp: 97.9 F (36.6 C) 98.5 F (36.9 C)  97.6 F (36.4 C)  TempSrc: Oral Oral  Oral  SpO2: 96%  100%   Weight: 39.2 kg     Height:        Intake/Output Summary (Last 24 hours) at 05/13/2023 1342 Last data filed at 05/13/2023 1225 Gross per 24 hour  Intake 185.75 ml  Output 850 ml  Net -664.25 ml   Filed Weights   05/11/23 0500 05/12/23 0555 05/13/23 9692  Weight: 38.6 kg 39.1 kg 39.2 kg    Examination:  General exam: Appears calm and comfortable  Respiratory system: Clear to auscultation. Respiratory effort normal. Cardiovascular system: S1 & S2 heard, RRR. No JVD, murmurs, rubs, gallops or clicks. No pedal edema. Gastrointestinal system: Abdomen is nondistended, soft and nontender. No organomegaly or masses felt. Normal bowel sounds heard. Central nervous system: Alert and oriented. No focal neurological deficits. Extremities: Symmetric 5 x 5 power. Skin: No rashes, lesions or ulcers.   Data Reviewed: I have personally reviewed following labs and imaging studies  CBC: Recent Labs  Lab 05/07/23 0506 05/08/23 0326 05/09/23 0314 05/09/23 0849 05/10/23 0401 05/11/23 0745  05/13/23 0422  WBC 12.1* 10.3 8.7  --  6.2 5.3 5.1  NEUTROABS 10.0* 9.0*  --   --  4.1 3.9 3.2  HGB 10.5* 9.5* 8.7* 8.7* 8.7* 9.1* 9.3*  HCT 34.0* 30.9* 28.0* 28.2* 28.5* 29.6* 30.2*  MCV 68.0* 67.2* 66.7*  --  67.4* 67.0* 67.6*  PLT 316 333 310  --  310 299 268   Basic Metabolic Panel: Recent Labs  Lab 05/07/23 0506 05/08/23 0326 05/09/23 0314 05/10/23 0401 05/11/23 0745 05/13/23 0422  NA 143 146* 136 135 139 140  K 3.9 3.3* 4.0 4.2 3.9 3.8  CL 107 108 102 101 103 104  CO2 25 26 25 24 27 26   GLUCOSE 91 79 131* 94 90 142*  BUN 21 16 14 11 10 8   CREATININE 1.32* 1.12* 1.14* 1.14* 1.15* 1.07*  CALCIUM  9.5 9.3 9.0 9.1 9.1 9.3  MG 2.3 2.0  --  1.8 1.9 2.0  PHOS 3.1 2.8  --  3.0 2.8 2.5   GFR: Estimated Creatinine Clearance: 25.5 mL/min (A) (by C-G formula based on SCr of 1.07 mg/dL (H)). Liver Function Tests: Recent Labs  Lab 05/07/23 0506 05/08/23 0326 05/10/23 0401 05/11/23 0745 05/13/23 0422  AST 56* 43* 38 38 28  ALT 39 33 34 31 30  ALKPHOS 56 56 56 56 55  BILITOT 0.9 1.1 0.6 0.8 0.4  PROT 7.5 7.0 6.7 6.8 6.7  ALBUMIN 3.4* 2.9* 2.7* 2.7* 2.8*   No results for input(s): LIPASE, AMYLASE in the last 168 hours. No results for input(s): AMMONIA in the last 168 hours. Coagulation Profile: No results for input(s): INR, PROTIME in the last 168 hours. Cardiac Enzymes: No results for input(s): CKTOTAL, CKMB, CKMBINDEX, TROPONINI in the last 168 hours. BNP (last 3 results) No results for input(s): PROBNP in the last 8760 hours. HbA1C: No results for input(s): HGBA1C in the last 72 hours. CBG: No results for input(s): GLUCAP in the last 168 hours. Lipid Profile: No results for input(s): CHOL, HDL, LDLCALC, TRIG, CHOLHDL, LDLDIRECT in the last 72 hours. Thyroid  Function Tests: No results for input(s): TSH, T4TOTAL, FREET4, T3FREE, THYROIDAB in the last 72 hours. Anemia Panel: No results for input(s): VITAMINB12,  FOLATE, FERRITIN, TIBC, IRON, RETICCTPCT in the last 72 hours.  Sepsis Labs: No results for input(s): PROCALCITON, LATICACIDVEN in the last 168 hours.  Recent Results (from the past 240 hours)  Resp panel by RT-PCR (RSV, Flu A&B, Covid) Anterior Nasal Swab     Status: Abnormal   Collection Time: 05/03/23  3:57 PM   Specimen: Anterior Nasal Swab  Result Value Ref Range Status   SARS Coronavirus 2 by RT PCR NEGATIVE NEGATIVE Final    Comment: (NOTE) SARS-CoV-2 target nucleic acids are NOT DETECTED.  The SARS-CoV-2 RNA is generally detectable in upper respiratory specimens during the acute  phase of infection. The lowest concentration of SARS-CoV-2 viral copies this assay can detect is 138 copies/mL. A negative result does not preclude SARS-Cov-2 infection and should not be used as the sole basis for treatment or other patient management decisions. A negative result may occur with  improper specimen collection/handling, submission of specimen other than nasopharyngeal swab, presence of viral mutation(s) within the areas targeted by this assay, and inadequate number of viral copies(<138 copies/mL). A negative result must be combined with clinical observations, patient history, and epidemiological information. The expected result is Negative.  Fact Sheet for Patients:  bloggercourse.com  Fact Sheet for Healthcare Providers:  seriousbroker.it  This test is no t yet approved or cleared by the [place]  FDA and  has been authorized for detection and/or diagnosis of SARS-CoV-2 by FDA under an Emergency Use Authorization (EUA). This EUA will remain  in effect (meaning this test can be used) for the duration of the COVID-19 declaration under Section 564(b)(1) of the Act, 21 U.S.C.section 360bbb-3(b)(1), unless the authorization is terminated  or revoked sooner.       Influenza A by PCR POSITIVE (A) NEGATIVE Final    Influenza B by PCR NEGATIVE NEGATIVE Final    Comment: (NOTE) The Xpert Xpress SARS-CoV-2/FLU/RSV plus assay is intended as an aid in the diagnosis of influenza from Nasopharyngeal swab specimens and should not be used as a sole basis for treatment. Nasal washings and aspirates are unacceptable for Xpert Xpress SARS-CoV-2/FLU/RSV testing.  Fact Sheet for Patients: bloggercourse.com  Fact Sheet for Healthcare Providers: seriousbroker.it  This test is not yet approved or cleared by the [place]  FDA and has been authorized for detection and/or diagnosis of SARS-CoV-2 by FDA under an Emergency Use Authorization (EUA). This EUA will remain in effect (meaning this test can be used) for the duration of the COVID-19 declaration under Section 564(b)(1) of the Act, 21 U.S.C. section 360bbb-3(b)(1), unless the authorization is terminated or revoked.     Resp Syncytial Virus by PCR NEGATIVE NEGATIVE Final    Comment: (NOTE) Fact Sheet for Patients: bloggercourse.com  Fact Sheet for Healthcare Providers: seriousbroker.it  This test is not yet approved or cleared by the [place]  FDA and has been authorized for detection and/or diagnosis of SARS-CoV-2 by FDA under an Emergency Use Authorization (EUA). This EUA will remain in effect (meaning this test can be used) for the duration of the COVID-19 declaration under Section 564(b)(1) of the Act, 21 U.S.C. section 360bbb-3(b)(1), unless the authorization is terminated or revoked.  Performed at [facility], [address] [place], [place] [address]      Radiology Studies: No results found.   Scheduled Meds:  allopurinol   100 mg Oral QPM   aspirin  EC  81 mg Oral Daily   cycloSPORINE   1 drop Both Eyes BID   diclofenac  Sodium  2 g Topical QID   docusate sodium   100 mg Oral BID   feeding supplement  237 mL Oral TID BM    ferrous sulfate   325 mg Oral Daily   itraconazole   200 mg Oral Q24H   metoprolol  tartrate  12.5 mg Oral BID   multivitamin with minerals  1 tablet Oral Daily   pantoprazole  (PROTONIX ) IV  40 mg Intravenous Q12H   sucralfate   1 g Oral Q8H   traZODone   50 mg Oral QHS   triamcinolone    Mouth/Throat TID   Continuous Infusions:  heparin  750 Units/hr (05/13/23 0900)   thiamine  (VITAMIN B1) injection 250  mg (05/13/23 1031)     LOS: 9 days   Fredia Skeeter, MD [facility]  05/13/2023, 1:42 PM   *Please note that this is a verbal dictation therefore any spelling or grammatical errors are due to the Dragon Medical One system interpretation.  Please page via Amion and do not message via secure chat for urgent patient care matters. Secure chat can be used for non urgent patient care matters.  How to contact the [facility] Attending or Consulting provider 7A - 7P or covering provider during after hours 7P -7A, for this patient?  Check the care team in [facility] and look for a) attending/consulting [facility] provider listed and b) the [facility] team listed. Page or secure chat 7A-7P. Log into [email or web address] and use [place] universal password to access. If you do not have the password, please contact the hospital operator. Locate the [facility] provider you are looking for under [facility] and page to a number that you can be directly reached. If you still have difficulty reaching the provider, please page the [facility] (Director on Call) for the Hospitalists listed on amion for assistance.

## 2023-05-13 NOTE — Plan of Care (Signed)
  Problem: Education: Goal: Knowledge of General Education information will improve Description: Including pain rating scale, medication(s)/side effects and non-pharmacologic comfort measures Outcome: Progressing   Problem: Health Behavior/Discharge Planning: Goal: Ability to manage health-related needs will improve Outcome: Progressing   Problem: Clinical Measurements: Goal: Ability to maintain clinical measurements within normal limits will improve Outcome: Progressing Goal: Will remain free from infection Outcome: Progressing Goal: Diagnostic test results will improve Outcome: Progressing Goal: Respiratory complications will improve Outcome: Progressing Goal: Cardiovascular complication will be avoided Outcome: Progressing   Problem: Activity: Goal: Risk for activity intolerance will decrease Outcome: Progressing   Problem: Nutrition: Goal: Adequate nutrition will be maintained Outcome: Progressing   Problem: Coping: Goal: Level of anxiety will decrease Outcome: Progressing   Problem: Elimination: Goal: Will not experience complications related to bowel motility Outcome: Progressing Goal: Will not experience complications related to urinary retention Outcome: Progressing   Problem: Pain Management: Goal: General experience of comfort will improve Outcome: Progressing   Problem: Safety: Goal: Ability to remain free from injury will improve Outcome: Progressing   Problem: Skin Integrity: Goal: Risk for impaired skin integrity will decrease Outcome: Progressing   Problem: Education: Goal: Ability to demonstrate management of disease process will improve Outcome: Progressing Goal: Ability to verbalize understanding of medication therapies will improve Outcome: Progressing Goal: Individualized Educational Video(s) Outcome: Progressing   Problem: Activity: Goal: Capacity to carry out activities will improve Outcome: Progressing   Problem: Cardiac: Goal:  Ability to achieve and maintain adequate cardiopulmonary perfusion will improve Outcome: Progressing   Problem: Education: Goal: Ability to demonstrate management of disease process will improve Outcome: Progressing Goal: Ability to verbalize understanding of medication therapies will improve Outcome: Progressing Goal: Individualized Educational Video(s) Outcome: Progressing   Problem: Activity: Goal: Capacity to carry out activities will improve Outcome: Progressing   Problem: Cardiac: Goal: Ability to achieve and maintain adequate cardiopulmonary perfusion will improve Outcome: Progressing

## 2023-05-13 NOTE — Progress Notes (Signed)
 PHARMACY - ANTICOAGULATION CONSULT NOTE  Pharmacy Consult for heparin  Indication: atrial fibrillation  Allergies  Allergen Reactions   Lactose Intolerance (Gi) Diarrhea    Patient Measurements: Height: 5' 2 (157.5 cm) Weight: 39.2 kg (86 lb 6.4 oz) IBW/kg (Calculated) : 50.1 Heparin  Dosing Weight: TBW  Vital Signs: Temp: 97.9 F (36.6 C) (01/02 0307) Temp Source: Oral (01/02 0307) BP: 143/81 (01/02 0307) Pulse Rate: 79 (01/02 0307)  Labs: Recent Labs    05/11/23 0745 05/13/23 0422  HGB 9.1* 9.3*  HCT 29.6* 30.2*  PLT 299 268  HEPARINUNFRC 0.32 0.34  CREATININE 1.15* 1.07*    Estimated Creatinine Clearance: 25.5 mL/min (A) (by C-G formula based on SCr of 1.07 mg/dL (H)).   Medical History: Past Medical History:  Diagnosis Date   Abdominal bruit    Abnormal echocardiogram    Anemia    Aortic insufficiency    Aortic root dilation (HCC)    Aortic valve disorder    Arthritis    all over (07/22/2012)   Arthropathy    Bilateral carotid artery stenosis    Carotid artery disease (HCC)    Chest discomfort    Edema    Exertional shortness of breath    Fatigue    GERD (gastroesophageal reflux disease)    Gout    Hypercholesteremia    Hypertension    Mitral regurgitation and aortic stenosis    Mixed hyperlipidemia    Murmur, cardiac    Peripheral vascular disease (HCC)    Pernicious anemia    PVD (peripheral vascular disease) (HCC)    Shortness of breath    Sickle-cell trait (HCC)    Thoracic aortic aneurysm (HCC)    Tobacco use    URI (upper respiratory infection)       Assessment: 89 YOF with new onset atrial fibrillation and new onset CHF. CHADsVASc score of 5 (age, sex, HTN, HF). Pharmacy consulted for heparin  initiation. Patient started on heparin  5000 units subcutaneously every 8 hours this admission for VTE prophylaxis, last dose given at 0553 on 12/28. Noted patient is on aspirin  and clopidogrel , for peripheral vascular disease. Patient also has  a history of epistaxis requiring visits to the ED.  No anticoagulation prior to admission. Note low weight. Pharmacy consulted for heparin .    Heparin  level 0.34 is therapeutic after running for ~48h with no level (refused lab draws).  Hgb 9.3, pltc 268 stable.  No issues reported.   Goal of Therapy:  Heparin  level 0.3-0.7 units/ml Monitor platelets by anticoagulation protocol: Yes   Plan:  Continue heparin  750 units/hr  Monitor daily heparin  level, CBC, signs/symptoms of bleeding  F/u long-term anticoagulation plan - when able to tolerate POs and all procedures complete  Maurilio Fila, PharmD Clinical Pharmacist 05/13/2023  7:21 AM

## 2023-05-13 NOTE — Care Management Important Message (Signed)
 Important Message  Patient Details  Name: Holly Guerra MRN: [id number] Date of Birth: 01/12/1941   Important Message Given:  Yes - Medicare IM     Renie Ora 05/13/2023, 12:47 PM

## 2023-05-13 NOTE — Progress Notes (Signed)
 Patient continues removing essential equipment and attempting to leave the bed.  Diversional activities are unsuccessful. Mitts applied, bed alarm is on.

## 2023-05-13 NOTE — Plan of Care (Signed)

## 2023-05-13 NOTE — Progress Notes (Signed)
 Patient continues attempting to leave the bed, confused X4.  Not following commands. PRN Ativan administered without relief.   Family called. Primary team informed.

## 2023-05-13 NOTE — Progress Notes (Signed)
 Physical Therapy Treatment Patient Details Name: Holly Guerra MRN: [id number] DOB: [birth year] Today's Date: 05/13/2023   History of Present Illness 83 y.o. female presents to [facility] hospital on 12.23.2024 with SOB. Pt admitted for management of new onset CHF. Pt also found to be positive for flu. PMH includes aortic valve disease, PVD, GERD, anemia, HTN.    PT Comments  The pt is making good functional progress. She was able to demonstrate improved activity tolerance through ambulating increased distances of up to ~120 ft before fatiguing this date. However, she demonstrates deficits in balance and cognition that place her at risk for falls. She was unable to ambulate without UE support without having a LOB and needing minA to recover. She also needed repeated cues on how to safely manage the rollator brakes with transfers. She is currently disoriented, believing she just got to the hospital last night when in reality she has been here x9 days. If pt has frequent care at home to supervise her due to her cognitive deficits and risk for falls, then she could d/c home with HHPT. However, if not, then she may benefit from short-term inpatient rehab, < 3 hours/day. Will continue to follow acutely.     If plan is discharge home, recommend the following: A little help with walking and/or transfers;A little help with bathing/dressing/bathroom;Assistance with cooking/housework;Assist for transportation;Help with stairs or ramp for entrance;Direct supervision/assist for medications management;Direct supervision/assist for financial management;Supervision due to cognitive status   Can travel by private vehicle     Yes  Equipment Recommendations  Rollator (4 wheels)    Recommendations for Other Services       Precautions / Restrictions Precautions Precautions: Fall;Other (comment) Precaution Comments: delirium Restrictions Weight Bearing Restrictions Per Provider Order: No     Mobility  Bed  Mobility Overal bed mobility: Needs Assistance Bed Mobility: Supine to Sit, Sit to Supine     Supine to sit: Supervision, HOB elevated Sit to supine: Supervision, HOB elevated   General bed mobility comments: increased time, supervision for safety    Transfers Overall transfer level: Needs assistance Equipment used: Rollator (4 wheels) Transfers: Sit to/from Stand Sit to Stand: Contact guard assist           General transfer comment: cues for hand placement and application of rollator brakes with transfers to/from the rollator seat, fair carryover noted. Pt often pushing down on the top of the handles or squeezing the handles and needing cues to push down on the bottom half of the handles to lock the brakes. CGA for safety    Ambulation/Gait Ambulation/Gait assistance: Contact guard assist, Min assist Gait Distance (Feet): 120 Feet (x2 bouts of ~120 ft > ~65 ft) Assistive device: Rollator (4 wheels), None Gait Pattern/deviations: Step-through pattern, Decreased stride length, Trunk flexed, Staggering left, Staggering right Gait velocity: reduced Gait velocity interpretation: <1.31 ft/sec, indicative of household ambulator   General Gait Details: Pt ambulates without LOB when utilizing the rollator, CGA for safety. However, when she attempts to ambulate without it she displays staggering bil and LOB bouts, needing minA to recover   Stairs             Wheelchair Mobility     Tilt Bed    Modified Rankin (Stroke Patients Only)       Balance Overall balance assessment: Needs assistance Sitting-balance support: No upper extremity supported, Feet supported Sitting balance-Leahy Scale: Good     Standing balance support: Bilateral upper extremity supported, Reliant on assistive device for balance,  During functional activity Standing balance-Leahy Scale: Poor Standing balance comment: Reliant on rollator for support                             Cognition Arousal: Alert Behavior During Therapy: WFL for tasks assessed/performed Overall Cognitive Status: Impaired/Different from baseline Area of Impairment: Awareness, Problem solving, Memory, Attention, Following commands, Safety/judgement                   Current Attention Level: Selective Memory: Decreased short-term memory Following Commands: Follows one step commands with increased time, Follows multi-step commands inconsistently Safety/Judgement: Decreased awareness of safety, Decreased awareness of deficits Awareness: Emergent Problem Solving: Slow processing, Difficulty sequencing, Requires verbal cues General Comments: Pt not aware of her cognitive deficits. She believes she just got the hospital last night, perseverating on finding her pocket book, even though she assisted therapist in looking everywhere in the room. No awareness of her lines when ambulating, needing cues to stop or turn different ways to avoid getting tangled, yet at rest she fidgets with her lines. Needs repeated cues on how to properly apply rollator brakes for safety with transfers, fair carryover demonstrated        Exercises      General Comments General comments (skin integrity, edema, etc.): VSS on RA      Pertinent Vitals/Pain Pain Assessment Pain Assessment: Faces Faces Pain Scale: Hurts a little bit Pain Location: Rt foot/ankle Pain Descriptors / Indicators: Guarding, Discomfort Pain Intervention(s): Limited activity within patient's tolerance, Monitored during session, Repositioned    Home Living                          Prior Function            PT Goals (current goals can now be found in the care plan section) Acute Rehab PT Goals Patient Stated Goal: to improve and go home PT Goal Formulation: With patient Time For Goal Achievement: 05/18/23 Potential to Achieve Goals: Fair Progress towards PT goals: Progressing toward goals    Frequency    Min  1X/week      PT Plan      Co-evaluation              AM-PAC PT 6 Clicks Mobility   Outcome Measure  Help needed turning from your back to your side while in a flat bed without using bedrails?: A Little Help needed moving from lying on your back to sitting on the side of a flat bed without using bedrails?: A Little Help needed moving to and from a bed to a chair (including a wheelchair)?: A Little Help needed standing up from a chair using your arms (e.g., wheelchair or bedside chair)?: A Little Help needed to walk in hospital room?: A Little Help needed climbing 3-5 steps with a railing? : A Little 6 Click Score: 18    End of Session Equipment Utilized During Treatment: Gait belt Activity Tolerance: Patient tolerated treatment well Patient left: in bed;with call bell/phone within reach;with bed alarm set   PT Visit Diagnosis: Other abnormalities of gait and mobility (R26.89);Muscle weakness (generalized) (M62.81);Pain;Unsteadiness on feet (R26.81) Pain - Right/Left: Right Pain - part of body: Leg     Time: 8757-8692 PT Time Calculation (min) (ACUTE ONLY): 25 min  Charges:    $Gait Training: 23-37 mins PT General Charges $$ ACUTE PT VISIT: 1 Visit  Theo Ferretti, PT, DPT Acute Rehabilitation Services  Office: [phone]    Theo CHRISTELLA Ferretti 05/13/2023, 1:49 PM

## 2023-05-14 DIAGNOSIS — Z7902 Long term (current) use of antithrombotics/antiplatelets: Secondary | ICD-10-CM | POA: Diagnosis not present

## 2023-05-14 DIAGNOSIS — R933 Abnormal findings on diagnostic imaging of other parts of digestive tract: Secondary | ICD-10-CM

## 2023-05-14 DIAGNOSIS — I5041 Acute combined systolic (congestive) and diastolic (congestive) heart failure: Secondary | ICD-10-CM | POA: Diagnosis not present

## 2023-05-14 DIAGNOSIS — R1310 Dysphagia, unspecified: Secondary | ICD-10-CM | POA: Diagnosis not present

## 2023-05-14 LAB — BASIC METABOLIC PANEL
Anion gap: 9 (ref 5–15)
BUN: 11 mg/dL (ref 8–23)
CO2: 24 mmol/L (ref 22–32)
Calcium: 9.1 mg/dL (ref 8.9–10.3)
Chloride: 106 mmol/L (ref 98–111)
Creatinine, Ser: 0.97 mg/dL (ref 0.44–1.00)
GFR, Estimated: 59 mL/min — ABNORMAL LOW (ref >60)
Glucose, Bld: 146 mg/dL — ABNORMAL HIGH (ref 70–99)
Potassium: 4 mmol/L (ref 3.5–5.1)
Sodium: 139 mmol/L (ref 135–145)

## 2023-05-14 LAB — CBC
HCT: 29.7 % — ABNORMAL LOW (ref 36.0–46.0)
Hemoglobin: 9.1 g/dL — ABNORMAL LOW (ref 12.0–15.0)
MCH: 20.7 pg — ABNORMAL LOW (ref 26.0–34.0)
MCHC: 30.6 g/dL (ref 30.0–36.0)
MCV: 67.5 fL — ABNORMAL LOW (ref 80.0–100.0)
Platelets: 270 10*3/uL (ref 150–400)
RBC: 4.4 MIL/uL (ref 3.87–5.11)
RDW: 19.6 % — ABNORMAL HIGH (ref 11.5–15.5)
WBC: 4.4 10*3/uL (ref 4.0–10.5)
nRBC: 0 % (ref 0.0–0.2)

## 2023-05-14 LAB — HEPARIN LEVEL (UNFRACTIONATED): Heparin Unfractionated: 0.44 [IU]/mL (ref 0.30–0.70)

## 2023-05-14 MED ORDER — NICOTINE 7 MG/24HR TD PT24
7.0000 mg | MEDICATED_PATCH | Freq: Every day | TRANSDERMAL | Status: DC
  Administered 2023-05-14: 7 mg via TRANSDERMAL
  Filled 2023-05-14 (×2): qty 1

## 2023-05-14 NOTE — Progress Notes (Signed)
 Mobility Specialist Progress Note:    05/14/23 1200  Mobility  Activity Ambulated with assistance in hallway  Level of Assistance Contact guard assist, steadying assist  Assistive Device Four wheel walker  Distance Ambulated (ft) 120 ft  Activity Response Tolerated well  Mobility Referral Yes  Mobility visit 1 Mobility  Mobility Specialist Start Time (ACUTE ONLY) 1134  Mobility Specialist Stop Time (ACUTE ONLY) 1148  Mobility Specialist Time Calculation (min) (ACUTE ONLY) 14 min   Pt received in bed and agreeable. Required minA to stand and CG during ambulation. Asymptomatic throughout. Pt left in bed with call bell and bed alarm on.  Holly Guerra Mobility Specialist Please contact via Special Educational Needs Teacher or Rehab office at [phone]

## 2023-05-14 NOTE — Progress Notes (Signed)
 Progress Note   Subjective  Patient states she is doing okay with liquid / soft diet. Denies complaints this AM. ENT has seen her regarding tongue lesions since our last assessment. Plavix  held since 12/30   Objective   Vital signs in last 24 hours: Temp:  [97.6 F (36.4 C)-98.1 F (36.7 C)] 98.1 F (36.7 C) (01/03 0523) Pulse Rate:  [81-82] 82 (01/03 0523) Resp:  [16-22] 16 (01/03 0523) BP: (117-141)/(61-81) 117/81 (01/03 0523) SpO2:  [90 %-100 %] 94 % (01/03 0523) Weight:  [39.3 kg] 39.3 kg (01/03 0523) Last BM Date : 05/11/23 General:    [name] in NAD Neurologic:  Alert and oriented,  grossly normal neurologically. Psych:  Cooperative. Normal mood and affect.  Intake/Output from previous day: 01/02 0701 - 01/03 0700 In: 190.6 [I.V.:149.6; IV Piggyback:41] Out: 850 [Urine:850] Intake/Output this shift: No intake/output data recorded.  Lab Results: Recent Labs    05/13/23 0422 05/14/23 0537  WBC 5.1 4.4  HGB 9.3* 9.1*  HCT 30.2* 29.7*  PLT 268 270   BMET Recent Labs    05/13/23 0422  NA 140  K 3.8  CL 104  CO2 26  GLUCOSE 142*  BUN 8  CREATININE 1.07*  CALCIUM  9.3   LFT Recent Labs    05/13/23 0422  PROT 6.7  ALBUMIN 2.8*  AST 28  ALT 30  ALKPHOS 55  BILITOT 0.4   PT/INR No results for input(s): LABPROT, INR in the last 72 hours.  Studies/Results: No results found.     Assessment / Plan:    83 year old female here with the following:   Dysphagia / odynophagia, weight loss New heart failure in the setting of influenza infection Decrease in functional status   Vitamin B12 levels normal.  Started on empiric antifungal, high dose protonix , and liquid carafate . 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  There are nonspecific changes of the mucosa concerning for esophagitis and some narrowing of the distal esophagus as well.  There is pathology there that could  correlate with her dysphagia. She has odynophagia and ulcerations on her tongue of unclear etiology, seen by ENT.  She is tolerating liquid diet so far without problems. I spoke with her family previously about the findings and potential EGD for this weekend after the Plavix  has washed out (Saturday will be 5 days since her last dose). I called her daughter [name] and spoke with her about plans. Given the patient's deterioration and limited PO intake, they do want to proceed with the EGD following discussion of risks / benefits. Will see if pathology there to correlate with her oral findings. Will need to be NPO after MN, and heparin  drip held for 6 hours pre procedure. Hopefully this can be scheduled for tomorrow AM (730).   Continuing IV protonix , liquid carafate , and antifungal (treating empirically for candidiasis) in the interim.     PLAN: - full liquid diet today, NPO after MN - plan for EGD with possible dilation tomorrow. Will need heparin  drip held for 6 hours pre procedure. Continue to hold Plavix .  - try to crush all pills where possible or use liquid formulation when possible - continue protonix  40mg  IV BID  - continue liquid carafate  10cc every 6 hours PRN - continue empiric antifungal for possible candidiasis  Patient to have procedure with either Dr. Rollin or Dr. Kristie who are covering [facility] this weekend. Call with questions.   Marcey  Leigh, MD [facility]

## 2023-05-14 NOTE — Progress Notes (Signed)
 Received call from daughter, states pt has not had a cigarette since admission and pt is anxious. Daughter asked if we could get a nicotine patch for pt. Notified MD

## 2023-05-14 NOTE — Plan of Care (Signed)
  Problem: Education: Goal: Knowledge of General Education information will improve Description: Including pain rating scale, medication(s)/side effects and non-pharmacologic comfort measures Outcome: Progressing   Problem: Health Behavior/Discharge Planning: Goal: Ability to manage health-related needs will improve Outcome: Progressing   Problem: Clinical Measurements: Goal: Ability to maintain clinical measurements within normal limits will improve Outcome: Progressing Goal: Will remain free from infection Outcome: Progressing Goal: Diagnostic test results will improve Outcome: Progressing Goal: Respiratory complications will improve Outcome: Progressing Goal: Cardiovascular complication will be avoided Outcome: Progressing   Problem: Activity: Goal: Risk for activity intolerance will decrease Outcome: Progressing   Problem: Nutrition: Goal: Adequate nutrition will be maintained Outcome: Progressing   Problem: Coping: Goal: Level of anxiety will decrease Outcome: Progressing   Problem: Elimination: Goal: Will not experience complications related to bowel motility Outcome: Progressing Goal: Will not experience complications related to urinary retention Outcome: Progressing   Problem: Pain Management: Goal: General experience of comfort will improve Outcome: Progressing   Problem: Safety: Goal: Ability to remain free from injury will improve Outcome: Progressing   Problem: Skin Integrity: Goal: Risk for impaired skin integrity will decrease Outcome: Progressing   Problem: Education: Goal: Ability to demonstrate management of disease process will improve Outcome: Progressing Goal: Ability to verbalize understanding of medication therapies will improve Outcome: Progressing Goal: Individualized Educational Video(s) Outcome: Progressing   Problem: Activity: Goal: Capacity to carry out activities will improve Outcome: Progressing   Problem: Cardiac: Goal:  Ability to achieve and maintain adequate cardiopulmonary perfusion will improve Outcome: Progressing   Problem: Education: Goal: Ability to demonstrate management of disease process will improve Outcome: Progressing Goal: Ability to verbalize understanding of medication therapies will improve Outcome: Progressing Goal: Individualized Educational Video(s) Outcome: Progressing   Problem: Activity: Goal: Capacity to carry out activities will improve Outcome: Progressing   Problem: Cardiac: Goal: Ability to achieve and maintain adequate cardiopulmonary perfusion will improve Outcome: Progressing

## 2023-05-14 NOTE — Progress Notes (Addendum)
 PHARMACY - ANTICOAGULATION CONSULT NOTE  Pharmacy Consult for heparin  Indication: atrial fibrillation  Allergies  Allergen Reactions   Lactose Intolerance (Gi) Diarrhea    Patient Measurements: Height: 5' 2 (157.5 cm) Weight: 39.3 kg (86 lb 11.2 oz) IBW/kg (Calculated) : 50.1 Heparin  Dosing Weight: TBW  Vital Signs: Temp: 98.1 F (36.7 C) (01/03 0523) Temp Source: Oral (01/03 0523) BP: 117/81 (01/03 0523) Pulse Rate: 82 (01/03 0523)  Labs: Recent Labs    05/11/23 0745 05/13/23 0422 05/14/23 0537  HGB 9.1* 9.3* 9.1*  HCT 29.6* 30.2* 29.7*  PLT 299 268 270  HEPARINUNFRC 0.32 0.34 0.44  CREATININE 1.15* 1.07*  --     Estimated Creatinine Clearance: 25.6 mL/min (A) (by C-G formula based on SCr of 1.07 mg/dL (H)).   Medical History: Past Medical History:  Diagnosis Date   Abdominal bruit    Abnormal echocardiogram    Anemia    Aortic insufficiency    Aortic root dilation (HCC)    Aortic valve disorder    Arthritis    all over (07/22/2012)   Arthropathy    Bilateral carotid artery stenosis    Carotid artery disease (HCC)    Chest discomfort    Edema    Exertional shortness of breath    Fatigue    GERD (gastroesophageal reflux disease)    Gout    Hypercholesteremia    Hypertension    Mitral regurgitation and aortic stenosis    Mixed hyperlipidemia    Murmur, cardiac    Peripheral vascular disease (HCC)    Pernicious anemia    PVD (peripheral vascular disease) (HCC)    Shortness of breath    Sickle-cell trait (HCC)    Thoracic aortic aneurysm (HCC)    Tobacco use    URI (upper respiratory infection)       Assessment: 70 YOF with new onset atrial fibrillation and new onset CHF. CHADsVASc score of 5 (age, sex, HTN, HF). Pharmacy consulted for heparin  initiation. Patient started on heparin  5000 units subcutaneously every 8 hours this admission for VTE prophylaxis, last dose given at 0553 on 12/28. Noted patient is on aspirin  and clopidogrel , for  peripheral vascular disease. Patient also has a history of epistaxis requiring visits to the ED.  No anticoagulation prior to admission. Note low weight. Pharmacy consulted for heparin .    Heparin  level 0.44 is therapeutic on 750 units/hr.  CBC stable - Hgb 9.1, pltc 270.  No issues reported.   ADDENDUM: For EGD tomorrow 0730am.  Heparin  infusion to be held @0130  per GI.  Goal of Therapy:  Heparin  level 0.3-0.7 units/ml Monitor platelets by anticoagulation protocol: Yes   Plan:  Continue heparin  750 units/hr - stop time in for 1/4 0130a Monitor daily heparin  level, CBC, signs/symptoms of bleeding  F/u long-term anticoagulation plan - when able to tolerate POs and all procedures complete  Maurilio Fila, PharmD Clinical Pharmacist 05/14/2023  7:13 AM

## 2023-05-14 NOTE — Progress Notes (Signed)
 PROGRESS NOTE    Holly Guerra  FMW:[id number] DOB: 1941/03/06 DOA: 05/03/2023 PCP: Elvera Ozell SAUNDERS, PA-C   Brief Narrative:  Holly Guerra was admitted to the hospital with the working diagnosis of acute heart failure in the setting of influenza A infection.    [name] with history of aortic insufficiency, peripheral vascular disease, GERD, essential hypertension, pernicious anemia presented to the ED yesterday with multiple symptoms, progressive shortness of breath X 1 week, some swelling, cough and orthopnea, in addition also complained of congestion sinus pain and headaches. -In the ED she was noted to be mildly hypoxic, BNP 4119, respiratory virus panel positive for influenza A, hemoglobin 9.2, creatinine 1.03,    Chest radiograph with cardiomegaly with bilateral hilar vascular congestion and cephalization of the vasculature.    EKG 89 bpm, left axis deviation, left anterior fascicular block, qtc 508, sinus rhythm with poor R R wave progression, no significant ST segment changes and negative T wave V5 and V6.   Assessment & Plan:   Principal Problem:   Acute heart failure (HCC) Active Problems:   Influenza with respiratory manifestation   Essential hypertension   AKI (acute kidney injury) (HCC)   Pernicious anemia   GERD (gastroesophageal reflux disease)   PVD (peripheral vascular disease) (HCC)   Protein-calorie malnutrition, severe   Dysphagia   Abnormal barium swallow   Antiplatelet or antithrombotic long-term use   Loss of weight  Failure to Thrive / Weight Loss  Poor PO Intake / Odynophagia/esophageal stricture/ Glossitis  Concern for possible oropharyngeal candidiasis Apparently she has greyish/brown coating on tongue as well as ulcerations with grey slough.  Not classic appearance for thrush, but treating empirically for oropharyngeal and esophageal candidiasis with itraconazole  with her pain/discomfort (no fluconazole  due to plavix ). 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 GI consulted, appreciate assistance -> full liquid diet, PPI BID, carafate , itraconazole  - hold plavix  for possible EGD which is now scheduled for today instead of tomorrow. B12 wnl. CT abdomen/pelvis without acute abnormality (limited eval due to motion and streak artifact) RD previously recommended cortrak, daughter [name] wasn't interested with concern she may pull this.  Will avoid for now.  Will see how she does with above measures. Also, seen by Dr. Roark of ENT for stomatitis on 05/12/2023, per him, it was unclear of the etiology of stomatitis but systemic issues likely stress and vitamin deficiency may be the cause.  He recommended trying antifungal first, patient already is on itraconazole  however I added topical Kenalog  per his recommendations.   Atrial Fibrillation with RVR Back in sinus, on oral metoprolol  and IV heparin  for now.   Acute Metabolic Encephalopathy Dysarthria  Per discussion with daughter, waxing/waning encephalopathy, dysarthria for the past week. Likely due to influenza infection. head CT -> progressive atrophy and white matter disease, advanced for age High dose thiamine .  Patient is fully alert and oriented and she is in better mood today.  Indeterminate Splenic Lesion Consider postcontrast CT vs MR    New onset of congestive heart failure (HCC) Echo with EF 45-50%, regional wall motion abnormalities, grade II diastolic dysfunction, moderate AS  Spironolactone , jardiance  Lasix     Influenza A with pneumonia No signs of bacterial superinfection.  Completed oseltamivir  for antiviral therapy.    Regional Wall Motion Abnormality Plan for outpatient cardiology follow up    Right Foot Pain  Peripheral Vascular Disease Hx L SFA stent  Related to  peripheral vascular disease?  Palpable pulse Will monitor for now.  She had no complaints  today.   AKI (acute kidney injury) (HCC) ruled out.  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  Last known normal creatinine record was more than 65 months old.   Hypokalemia.  Resolved   Essential hypertension Patient is on metoprolol  and blood pressure is controlled.   Pernicious anemia Cell count has been stable.  Patient on B12 injections q 30 days.    Iron deficiency anemia, continue with oral iron supplementation.    GERD (gastroesophageal reflux disease) Continue with pantoprazole .    PVD (peripheral vascular disease) (HCC) Continue blood pressure control  Patient on aspirin , and clopidogrel  (on hold) Hold cilostazol  in the setting of heart failure.   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   Status is: Inpatient Remains inpatient appropriate because: Scheduled for EGD and possible esophageal dilatation on Saturday, 05/16/2023.   Estimated body mass index is 15.86 kg/m as calculated from the following:   Height as of this encounter: 5' 2 (1.575 m).   Weight as of this encounter: 39.3 kg.    Nutritional Assessment: Body mass index is 15.86 kg/m.Holly Guerra Seen by dietician.  I agree with the assessment and plan as outlined below: Nutrition Status: Nutrition Problem: Severe Malnutrition Etiology: chronic illness Signs/Symptoms: severe muscle depletion, severe fat depletion Interventions: Refer to RD note for recommendations  . Skin Assessment: I have examined the patient's skin and I agree with the wound assessment as performed by the wound care RN as outlined below:    Consultants:  GI   Procedures:  As above  Antimicrobials:  Anti-infectives (From admission, onward)    Start      Dose/Rate Route Frequency Ordered Stop   05/09/23 1800  itraconazole  (SPORANOX ) 10 MG/ML solution 200 mg  Status:  Discontinued        200 mg Oral Every 24 hours 05/09/23 1616 05/09/23 1624   05/09/23 1800  itraconazole  (SPORANOX ) capsule 200 mg        200 mg Oral Every 24 hours 05/09/23 1624     05/04/23 0100  oseltamivir  (TAMIFLU ) capsule 30 mg        30 mg Oral Daily 05/04/23 0004 05/08/23 0835         Subjective: Patient seen and examined.  She says that her right foot pain is improved.  She did not complain of tongue pain today until I brought that up.  Objective: Vitals:   05/13/23 1304 05/13/23 1538 05/13/23 2110 05/14/23 0523  BP: (!) 141/77  125/61 117/81  Pulse: 81  81 82  Resp: (!) 22 17 18 16   Temp: 97.6 F (36.4 C)  97.9 F (36.6 C) 98.1 F (36.7 C)  TempSrc: Oral  Axillary Oral  SpO2: 100%  90% 94%  Weight:    39.3 kg  Height:        Intake/Output Summary (Last 24 hours) at 05/14/2023 0827 Last data filed at 05/14/2023 0530 Gross per 24 hour  Intake 163.82 ml  Output 850 ml  Net -686.18 ml   Filed Weights   05/12/23 0555 05/13/23 0307 05/14/23 0523  Weight: 39.1 kg 39.2 kg 39.3 kg    Examination:  General exam: Appears  calm and comfortable  Respiratory system: Clear to auscultation. Respiratory effort normal. Cardiovascular system: S1 & S2 heard, RRR. No JVD, murmurs, rubs, gallops or clicks. No pedal edema. Gastrointestinal system: Abdomen is nondistended, soft and nontender. No organomegaly or masses felt. Normal bowel sounds heard. Central nervous system: Alert and oriented. No focal neurological deficits. Extremities: Symmetric 5 x 5 power. Skin: No rashes, lesions or ulcers.    Data Reviewed: I have personally reviewed following labs and imaging studies  CBC: Recent Labs  Lab 05/08/23 0326 05/09/23 0314 05/09/23 0849 05/10/23 0401 05/11/23 0745 05/13/23 0422 05/14/23 0537  WBC 10.3 8.7  --  6.2 5.3 5.1 4.4  NEUTROABS 9.0*  --   --  4.1  3.9 3.2  --   HGB 9.5* 8.7* 8.7* 8.7* 9.1* 9.3* 9.1*  HCT 30.9* 28.0* 28.2* 28.5* 29.6* 30.2* 29.7*  MCV 67.2* 66.7*  --  67.4* 67.0* 67.6* 67.5*  PLT 333 310  --  310 299 268 270   Basic Metabolic Panel: Recent Labs  Lab 05/08/23 0326 05/09/23 0314 05/10/23 0401 05/11/23 0745 05/13/23 0422  NA 146* 136 135 139 140  K 3.3* 4.0 4.2 3.9 3.8  CL 108 102 101 103 104  CO2 26 25 24 27 26   GLUCOSE 79 131* 94 90 142*  BUN 16 14 11 10 8   CREATININE 1.12* 1.14* 1.14* 1.15* 1.07*  CALCIUM  9.3 9.0 9.1 9.1 9.3  MG 2.0  --  1.8 1.9 2.0  PHOS 2.8  --  3.0 2.8 2.5   GFR: Estimated Creatinine Clearance: 25.6 mL/min (A) (by C-G formula based on SCr of 1.07 mg/dL (H)). Liver Function Tests: Recent Labs  Lab 05/08/23 0326 05/10/23 0401 05/11/23 0745 05/13/23 0422  AST 43* 38 38 28  ALT 33 34 31 30  ALKPHOS 56 56 56 55  BILITOT 1.1 0.6 0.8 0.4  PROT 7.0 6.7 6.8 6.7  ALBUMIN 2.9* 2.7* 2.7* 2.8*   No results for input(s): LIPASE, AMYLASE in the last 168 hours. No results for input(s): AMMONIA in the last 168 hours. Coagulation Profile: No results for input(s): INR, PROTIME in the last 168 hours. Cardiac Enzymes: No results for input(s): CKTOTAL, CKMB, CKMBINDEX, TROPONINI in the last 168 hours. BNP (last 3 results) No results for input(s): PROBNP in the last 8760 hours. HbA1C: No results for input(s): HGBA1C in the last 72 hours. CBG: No results for input(s): GLUCAP in the last 168 hours. Lipid Profile: No results for input(s): CHOL, HDL, LDLCALC, TRIG, CHOLHDL, LDLDIRECT in the last 72 hours. Thyroid  Function Tests: No results for input(s): TSH, T4TOTAL, FREET4, T3FREE, THYROIDAB in the last 72 hours. Anemia Panel: No results for input(s): VITAMINB12, FOLATE, FERRITIN, TIBC, IRON, RETICCTPCT in the last 72 hours.  Sepsis Labs: No results for input(s): PROCALCITON, LATICACIDVEN in the last 168 hours.  No results  found for this or any previous visit (from the past 240 hours).    Radiology Studies: No results found.   Scheduled Meds:  allopurinol   100 mg Oral QPM   aspirin  EC  81 mg Oral Daily   cycloSPORINE   1 drop Both Eyes BID   diclofenac  Sodium  2 g Topical QID   docusate sodium   100 mg Oral BID   feeding supplement  237 mL Oral TID BM   ferrous sulfate   325 mg Oral Daily   itraconazole   200 mg Oral Q24H   metoprolol  tartrate  12.5 mg Oral BID   multivitamin with minerals  1 tablet Oral  Daily   pantoprazole  (PROTONIX ) IV  40 mg Intravenous Q12H   sucralfate   1 g Oral Q8H   traZODone   50 mg Oral QHS   triamcinolone    Mouth/Throat TID   Continuous Infusions:  heparin  750 Units/hr (05/14/23 0000)   thiamine  (VITAMIN B1) injection Stopped (05/13/23 1057)     LOS: 10 days   Fredia Skeeter, MD [facility]  05/14/2023, 8:27 AM   *Please note that this is a verbal dictation therefore any spelling or grammatical errors are due to the Dragon Medical One system interpretation.  Please page via Amion and do not message via secure chat for urgent patient care matters. Secure chat can be used for non urgent patient care matters.  How to contact the [facility] Attending or Consulting provider 7A - 7P or covering provider during after hours 7P -7A, for this patient?  Check the care team in [facility] and look for a) attending/consulting [facility] provider listed and b) the [facility] team listed. Page or secure chat 7A-7P. Log into [email or web address] and use [place]'s universal password to access. If you do not have the password, please contact the hospital operator. Locate the [facility] provider you are looking for under [facility] and page to a number that you can be directly reached. If you still have difficulty reaching the provider, please page the [facility] ([facility]) (Director on Call) for the Hospitalists listed on amion for assistance.

## 2023-05-14 NOTE — H&P (View-Only) (Signed)
 Progress Note   Subjective  Patient states she is doing okay with liquid / soft diet. Denies complaints this AM. ENT has seen her regarding tongue lesions since our last assessment. Plavix  held since 12/30   Objective   Vital signs in last 24 hours: Temp:  [97.6 F (36.4 C)-98.1 F (36.7 C)] 98.1 F (36.7 C) (01/03 0523) Pulse Rate:  [81-82] 82 (01/03 0523) Resp:  [16-22] 16 (01/03 0523) BP: (117-141)/(61-81) 117/81 (01/03 0523) SpO2:  [90 %-100 %] 94 % (01/03 0523) Weight:  [39.3 kg] 39.3 kg (01/03 0523) Last BM Date : 05/11/23 General:    [name] in NAD Neurologic:  Alert and oriented,  grossly normal neurologically. Psych:  Cooperative. Normal mood and affect.  Intake/Output from previous day: 01/02 0701 - 01/03 0700 In: 190.6 [I.V.:149.6; IV Piggyback:41] Out: 850 [Urine:850] Intake/Output this shift: No intake/output data recorded.  Lab Results: Recent Labs    05/13/23 0422 05/14/23 0537  WBC 5.1 4.4  HGB 9.3* 9.1*  HCT 30.2* 29.7*  PLT 268 270   BMET Recent Labs    05/13/23 0422  NA 140  K 3.8  CL 104  CO2 26  GLUCOSE 142*  BUN 8  CREATININE 1.07*  CALCIUM  9.3   LFT Recent Labs    05/13/23 0422  PROT 6.7  ALBUMIN 2.8*  AST 28  ALT 30  ALKPHOS 55  BILITOT 0.4   PT/INR No results for input(s): LABPROT, INR in the last 72 hours.  Studies/Results: No results found.     Assessment / Plan:    83 year old female here with the following:   Dysphagia / odynophagia, weight loss New heart failure in the setting of influenza infection Decrease in functional status   Vitamin B12 levels normal.  Started on empiric antifungal, high dose protonix , and liquid carafate . 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  There are nonspecific changes of the mucosa concerning for esophagitis and some narrowing of the distal esophagus as well.  There is pathology there that could  correlate with her dysphagia. She has odynophagia and ulcerations on her tongue of unclear etiology, seen by ENT.  She is tolerating liquid diet so far without problems. I spoke with her family previously about the findings and potential EGD for this weekend after the Plavix  has washed out (Saturday will be 5 days since her last dose). I called her daughter [name] and spoke with her about plans. Given the patient's deterioration and limited PO intake, they do want to proceed with the EGD following discussion of risks / benefits. Will see if pathology there to correlate with her oral findings. Will need to be NPO after MN, and heparin  drip held for 6 hours pre procedure. Hopefully this can be scheduled for tomorrow AM (730).   Continuing IV protonix , liquid carafate , and antifungal (treating empirically for candidiasis) in the interim.     PLAN: - full liquid diet today, NPO after MN - plan for EGD with possible dilation tomorrow. Will need heparin  drip held for 6 hours pre procedure. Continue to hold Plavix .  - try to crush all pills where possible or use liquid formulation when possible - continue protonix  40mg  IV BID  - continue liquid carafate  10cc every 6 hours PRN - continue empiric antifungal for possible candidiasis  Patient to have procedure with either Dr. Rollin or Dr. Kristie who are covering [facility] this weekend. Call with questions.   Holly  Leigh, MD [facility]

## 2023-05-15 ENCOUNTER — Encounter (HOSPITAL_COMMUNITY)
Admission: EM | Disposition: A | Discharge status: Skilled Nursing Facility | Payer: Self-pay | Source: Home / Self Care | Attending: Family Medicine

## 2023-05-15 ENCOUNTER — Inpatient Hospital Stay (HOSPITAL_COMMUNITY): Payer: Medicare Other | Admitting: Certified Registered"

## 2023-05-15 ENCOUNTER — Encounter (HOSPITAL_COMMUNITY): Payer: Self-pay

## 2023-05-15 DIAGNOSIS — I4891 Unspecified atrial fibrillation: Secondary | ICD-10-CM | POA: Diagnosis not present

## 2023-05-15 DIAGNOSIS — K222 Esophageal obstruction: Secondary | ICD-10-CM

## 2023-05-15 DIAGNOSIS — I5041 Acute combined systolic (congestive) and diastolic (congestive) heart failure: Secondary | ICD-10-CM | POA: Diagnosis not present

## 2023-05-15 DIAGNOSIS — R1310 Dysphagia, unspecified: Secondary | ICD-10-CM | POA: Diagnosis not present

## 2023-05-15 DIAGNOSIS — I2510 Atherosclerotic heart disease of native coronary artery without angina pectoris: Secondary | ICD-10-CM

## 2023-05-15 DIAGNOSIS — F17210 Nicotine dependence, cigarettes, uncomplicated: Secondary | ICD-10-CM

## 2023-05-15 HISTORY — PX: ESOPHAGOGASTRODUODENOSCOPY (EGD) WITH PROPOFOL: SHX5813

## 2023-05-15 HISTORY — PX: SAVORY DILATION: SHX5439

## 2023-05-15 LAB — CBC
HCT: 26.3 % — ABNORMAL LOW (ref 36.0–46.0)
Hemoglobin: 8.2 g/dL — ABNORMAL LOW (ref 12.0–15.0)
MCH: 20.9 pg — ABNORMAL LOW (ref 26.0–34.0)
MCHC: 31.2 g/dL (ref 30.0–36.0)
MCV: 67.1 fL — ABNORMAL LOW (ref 80.0–100.0)
Platelets: 250 10*3/uL (ref 150–400)
RBC: 3.92 MIL/uL (ref 3.87–5.11)
RDW: 19.5 % — ABNORMAL HIGH (ref 11.5–15.5)
WBC: 4.4 10*3/uL (ref 4.0–10.5)
nRBC: 0 % (ref 0.0–0.2)

## 2023-05-15 SURGERY — ESOPHAGOGASTRODUODENOSCOPY (EGD) WITH PROPOFOL
Anesthesia: Monitor Anesthesia Care

## 2023-05-15 MED ORDER — PANTOPRAZOLE SODIUM 20 MG PO TBEC: 20.0000 mg | DELAYED_RELEASE_TABLET | Freq: Two times a day (BID) | ORAL | Status: DC

## 2023-05-15 MED ORDER — PROPOFOL 500 MG/50ML IV EMUL
INTRAVENOUS | Status: DC | PRN
  Administered 2023-05-15: 50 ug/kg/min via INTRAVENOUS

## 2023-05-15 MED ORDER — PANTOPRAZOLE SODIUM 40 MG PO TBEC
40.0000 mg | DELAYED_RELEASE_TABLET | Freq: Two times a day (BID) | ORAL | Status: DC
  Administered 2023-05-15 – 2023-05-19 (×9): 40 mg via ORAL
  Filled 2023-05-15 (×9): qty 1

## 2023-05-15 MED ORDER — PROPOFOL 10 MG/ML IV BOLUS
INTRAVENOUS | Status: DC | PRN
  Administered 2023-05-15 (×3): 20 mg via INTRAVENOUS

## 2023-05-15 MED ORDER — NICOTINE 7 MG/24HR TD PT24
7.0000 mg | MEDICATED_PATCH | Freq: Every day | TRANSDERMAL | Status: DC
  Administered 2023-05-15: 7 mg via TRANSDERMAL
  Filled 2023-05-15: qty 1

## 2023-05-15 MED ORDER — SODIUM CHLORIDE 0.9 % IV SOLN: INTRAVENOUS | Status: DC | PRN

## 2023-05-15 SURGICAL SUPPLY — 14 items

## 2023-05-15 NOTE — Anesthesia Postprocedure Evaluation (Signed)
 Anesthesia Post Note  Patient: [name]  Procedure(s) Performed: ESOPHAGOGASTRODUODENOSCOPY (EGD) WITH PROPOFOL  SAVORY DILATION     Patient location during evaluation: PACU Anesthesia Type: MAC Level of consciousness: awake and alert Pain management: pain level controlled Vital Signs Assessment: post-procedure vital signs reviewed and stable Respiratory status: spontaneous breathing, nonlabored ventilation and respiratory function stable Cardiovascular status: stable and blood pressure returned to baseline Postop Assessment: no apparent nausea or vomiting Anesthetic complications: no   No notable events documented.  Last Vitals:    Last Pain:                 Holly Guerra

## 2023-05-15 NOTE — Anesthesia Preprocedure Evaluation (Addendum)
 Anesthesia Evaluation  Patient identified by MRN, date of birth, ID band Patient confused    Reviewed: Allergy & Precautions, NPO status , Patient's Chart, lab work & pertinent test results  Airway Mallampati: II  TM Distance: >3 FB Neck ROM: Full    Dental  (+) Dental Advisory Given   Pulmonary Current Smoker and Patient abstained from smoking.   Pulmonary exam normal breath sounds clear to auscultation       Cardiovascular hypertension, + CAD, + Cardiac Stents and + Peripheral Vascular Disease  + dysrhythmias Atrial Fibrillation + Valvular Problems/Murmurs MR, AI and AS  Rhythm:Regular Rate:Normal + Systolic murmurs and + Diastolic murmurs Echo 05/04/23: 1. Papillary muscle hypertrophy. Left ventricular ejection fraction, by  estimation, is 45 to 50%. The left ventricle has mildly decreased  function. The left ventricle demonstrates regional wall motion  abnormalities (see scoring diagram/findings for  description). There is severe concentric left ventricular hypertrophy of  the inferior segment. Left ventricular diastolic parameters are consistent  with Grade II diastolic dysfunction (pseudonormalization). Basal function  is preserved.   2. Right ventricular systolic function is normal. The right ventricular  size is normal. Tricuspid regurgitation signal is inadequate for assessing  PA pressure.   3. Left atrial size was severely dilated.   4. Mitral valve leaflet thickening and doming without clear  calcifications. The mitral valve is abnormal. Mild mitral valve  regurgitation.   5. The aortic valve is calcified. Aortic valve regurgitation is mild to  moderate. Moderate aortic valve stenosis. Aortic regurgitation PHT  measures 306 msec. Aortic valve area, by VTI measures 1.02 cm. Aortic  valve Vmax measures 3.02 m/s.   6. There is mild dilatation of the ascending aorta, measuring 39 mm. Mild  when indexed to age, gender,  and BSA.   7. The inferior vena cava is normal in size with greater than 50%  respiratory variability, suggesting right atrial pressure of 3 mmHg.     Neuro/Psych negative neurological ROS     GI/Hepatic Neg liver ROS,GERD  Medicated,,dysphagia, abnormal barium swallow   Endo/Other  negative endocrine ROS    Renal/GU negative Renal ROS     Musculoskeletal  (+) Arthritis ,    Abdominal   Peds  Hematology  (+) Blood dyscrasia, Sickle cell trait and anemia   Anesthesia Other Findings Day of surgery medications reviewed with the patient.  Reproductive/Obstetrics                              Anesthesia Physical Anesthesia Plan  ASA: 4  Anesthesia Plan: MAC   Post-op Pain Management: Minimal or no pain anticipated   Induction: Intravenous  PONV Risk Score and Plan: 1 and TIVA and Treatment may vary due to age or medical condition  Airway Management Planned: Natural Airway and Simple Face Mask  Additional Equipment:   Intra-op Plan:   Post-operative Plan:   Informed Consent: I have reviewed the patients History and Physical, chart, labs and discussed the procedure including the risks, benefits and alternatives for the proposed anesthesia with the patient or authorized representative who has indicated his/her understanding and acceptance.     Dental advisory given and Consent reviewed with POA  Plan Discussed with: CRNA  Anesthesia Plan Comments:          Anesthesia Quick Evaluation

## 2023-05-15 NOTE — Interval H&P Note (Signed)
 History and Physical Interval Note:  05/15/2023 7:39 AM  [facility]  has presented today for surgery, with the diagnosis of dysphagia, abnormal barium swallow.  The various methods of treatment have been discussed with the patient and family. After consideration of risks, benefits and other options for treatment, the patient has consented to  Procedure(s): ESOPHAGOGASTRODUODENOSCOPY (EGD) WITH PROPOFOL  (N/A) as a surgical intervention.  The patient's history has been reviewed, patient examined, no change in status, stable for surgery.  I have reviewed the patient's chart and labs.  Questions were answered to the patient's satisfaction.     Holly Guerra D

## 2023-05-15 NOTE — Progress Notes (Signed)
 PROGRESS NOTE    Holly Guerra  FMW:[id number] DOB: 09/02/1940 DOA: 05/03/2023 PCP: Elvera Ozell SAUNDERS, PA-C   Brief Narrative:  Holly Guerra was admitted to the hospital with the working diagnosis of acute heart failure in the setting of influenza A infection.    [name] with history of aortic insufficiency, peripheral vascular disease, GERD, essential hypertension, pernicious anemia presented to the ED yesterday with multiple symptoms, progressive shortness of breath X 1 week, some swelling, cough and orthopnea, in addition also complained of congestion sinus pain and headaches. -In the ED she was noted to be mildly hypoxic, BNP 4119, respiratory virus panel positive for influenza A, hemoglobin 9.2, creatinine 1.03,    Chest radiograph with cardiomegaly with bilateral hilar vascular congestion and cephalization of the vasculature.    EKG 89 bpm, left axis deviation, left anterior fascicular block, qtc 508, sinus rhythm with poor R R wave progression, no significant ST segment changes and negative T wave V5 and V6.   Assessment & Plan:   Principal Problem:   Acute heart failure (HCC) Active Problems:   Influenza with respiratory manifestation   Essential hypertension   AKI (acute kidney injury) (HCC)   Pernicious anemia   GERD (gastroesophageal reflux disease)   PVD (peripheral vascular disease) (HCC)   Protein-calorie malnutrition, severe   Dysphagia   Abnormal barium swallow   Antiplatelet or antithrombotic long-term use   Loss of weight  Failure to Thrive / Weight Loss  Poor PO Intake / Odynophagia/esophageal stricture/ Glossitis  Concern for possible oropharyngeal candidiasis Apparently she has greyish/brown coating on tongue as well as ulcerations with grey slough.  Not classic appearance for thrush, but treating empirically for oropharyngeal and esophageal candidiasis with itraconazole  with her pain/discomfort (no fluconazole  due to plavix ). 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 GI consulted, Plavix  stopped, had 5 days washout, underwent EGD by Dr. Rollin 05/15/2023 with esophageal dilatation which appeared not benign per GI notes.  They recommended resuming heparin  and Plavix  at least 24 hours after, ideally 48 hours after. B12 wnl. CT abdomen/pelvis without acute abnormality (limited eval due to motion and streak artifact) RD previously recommended cortrak, daughter [name] wasn't interested with concern she may pull this.  Will avoid for now.  Will see how she does with above measures. Also, seen by Dr. Roark of ENT for stomatitis on 05/12/2023, per him, it was unclear of the etiology of stomatitis but systemic issues likely stress and vitamin deficiency may be the cause.  He recommended trying antifungal first, patient already is on itraconazole  however I added topical Kenalog  per his recommendations.   Atrial Fibrillation with RVR Back in sinus, on oral metoprolol  and IV heparin  for now.   Acute Metabolic Encephalopathy Dysarthria  Per discussion with daughter, waxing/waning encephalopathy, dysarthria for the past week. Likely due to influenza infection. head CT -> progressive atrophy and white matter disease, advanced for age High dose thiamine .  Patient is fully alert and oriented and she is in better mood today.  Indeterminate Splenic Lesion Consider postcontrast CT vs MR    New onset of congestive heart failure (HCC) Echo with EF 45-50%, regional wall motion abnormalities, grade II diastolic dysfunction, moderate AS  Spironolactone , jardiance  Lasix     Influenza A with pneumonia No signs of bacterial superinfection.  Completed oseltamivir  for antiviral therapy.    Regional Wall Motion Abnormality Plan for outpatient cardiology follow up    Right  Foot Pain  Peripheral Vascular Disease Hx L SFA stent  Related to peripheral vascular  disease?  Palpable pulse Will monitor for now.  She had no complaints today.   AKI (acute kidney injury) (HCC) ruled out.  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  Last known normal creatinine record was more than 62 months old.   Hypokalemia.  Resolved   Essential hypertension Patient is on metoprolol  and blood pressure is controlled.   Pernicious anemia Cell count has been stable.  Patient on B12 injections q 30 days.    Iron deficiency anemia, continue with oral iron supplementation.    GERD (gastroesophageal reflux disease) Continue with pantoprazole .    PVD (peripheral vascular disease) (HCC) Continue blood pressure control  Patient on aspirin , and clopidogrel  (on hold) Hold cilostazol  in the setting of heart failure.   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   Status is: Inpatient Remains inpatient appropriate because: Scheduled for EGD and possible esophageal dilatation on Saturday, 05/16/2023.   Estimated body mass index is 16.3 kg/m as calculated from the following:   Height as of this encounter: 5' 2 (1.575 m).   Weight as of this encounter: 40.4 kg.    Nutritional Assessment: Body mass index is 16.3 kg/m.[name] Seen by dietician.  I agree with the assessment and plan as outlined below: Nutrition Status: Nutrition Problem: Severe Malnutrition Etiology: chronic illness Signs/Symptoms: severe muscle depletion, severe fat depletion Interventions: Refer to RD note for recommendations  . Skin Assessment: I have examined the patient's skin and I agree with the wound assessment as performed by the wound care RN as outlined below:    Consultants:  GI   Procedures:  As  above  Antimicrobials:  Anti-infectives (From admission, onward)    Start     Dose/Rate Route Frequency Ordered Stop   05/09/23 1800  itraconazole  (SPORANOX ) 10 MG/ML solution 200 mg  Status:  Discontinued        200 mg Oral Every 24 hours 05/09/23 1616 05/09/23 1624   05/09/23 1800  itraconazole  (SPORANOX ) capsule 200 mg        200 mg Oral Every 24 hours 05/09/23 1624     05/04/23 0100  oseltamivir  (TAMIFLU ) capsule 30 mg        30 mg Oral Daily 05/04/23 0004 05/08/23 0835         Subjective: Patient seen and examined after EGD.  She had no complaints today.  Tongue pain is improving.  Objective: Vitals:   05/15/23 0830 05/15/23 0847 05/15/23 0952 05/15/23 1002  BP: (!) 88/56 (!) 112/59    Pulse: 72 69 60 72  Resp: (!) 21 14 19    Temp: (!) 97.5 F (36.4 C)     TempSrc:      SpO2: 97% 97% 99%   Weight:      Height:        Intake/Output Summary (Last 24 hours) at 05/15/2023 1257 Last data filed at 05/15/2023 1212 Gross per 24 hour  Intake 345 ml  Output 600 ml  Net -255 ml   Filed Weights   05/13/23 0307 05/14/23 0523 05/15/23 0346  Weight: 39.2 kg 39.3 kg 40.4 kg    Examination:  General exam: Appears calm and comfortable  Respiratory system: Clear to auscultation. Respiratory effort normal. Cardiovascular system: S1 & S2 heard, RRR. No JVD, murmurs, rubs, gallops or clicks. No pedal edema. Gastrointestinal system: Abdomen is nondistended, soft and nontender. No organomegaly or masses felt. Normal bowel sounds heard. Central nervous system: Alert and oriented. No focal neurological deficits. Extremities: Symmetric 5 x 5 power. Skin: No rashes, lesions or ulcers.   Data Reviewed: I have personally reviewed following labs and imaging studies  CBC: Recent Labs  Lab 05/10/23 0401 05/11/23 0745 05/13/23 0422 05/14/23 0537 05/15/23 0338  WBC 6.2 5.3 5.1 4.4 4.4  NEUTROABS 4.1 3.9 3.2  --   --   HGB 8.7* 9.1* 9.3* 9.1* 8.2*  HCT 28.5* 29.6* 30.2* 29.7* 26.3*   MCV 67.4* 67.0* 67.6* 67.5* 67.1*  PLT 310 299 268 270 250   Basic Metabolic Panel: Recent Labs  Lab 05/09/23 0314 05/10/23 0401 05/11/23 0745 05/13/23 0422 05/14/23 1042  NA 136 135 139 140 139  K 4.0 4.2 3.9 3.8 4.0  CL 102 101 103 104 106  CO2 25 24 27 26 24   GLUCOSE 131* 94 90 142* 146*  BUN 14 11 10 8 11   CREATININE 1.14* 1.14* 1.15* 1.07* 0.97  CALCIUM  9.0 9.1 9.1 9.3 9.1  MG  --  1.8 1.9 2.0  --   PHOS  --  3.0 2.8 2.5  --    GFR: Estimated Creatinine Clearance: 29 mL/min (by C-G formula based on SCr of 0.97 mg/dL). Liver Function Tests: Recent Labs  Lab 05/10/23 0401 05/11/23 0745 05/13/23 0422  AST 38 38 28  ALT 34 31 30  ALKPHOS 56 56 55  BILITOT 0.6 0.8 0.4  PROT 6.7 6.8 6.7  ALBUMIN 2.7* 2.7* 2.8*   No results for input(s): LIPASE, AMYLASE in the last 168 hours. No results for input(s): AMMONIA in the last 168 hours. Coagulation Profile: No results for input(s): INR, PROTIME in the last 168 hours. Cardiac Enzymes: No results for input(s): CKTOTAL, CKMB, CKMBINDEX, TROPONINI in the last 168 hours. BNP (last 3 results) No results for input(s): PROBNP in the last 8760 hours. HbA1C: No results for input(s): HGBA1C in the last 72 hours. CBG: No results for input(s): GLUCAP in the last 168 hours. Lipid Profile: No results for input(s): CHOL, HDL, LDLCALC, TRIG, CHOLHDL, LDLDIRECT in the last 72 hours. Thyroid  Function Tests: No results for input(s): TSH, T4TOTAL, FREET4, T3FREE, THYROIDAB in the last 72 hours. Anemia Panel: No results for input(s): VITAMINB12, FOLATE, FERRITIN, TIBC, IRON, RETICCTPCT in the last 72 hours.  Sepsis Labs: No results for input(s): PROCALCITON, LATICACIDVEN in the last 168 hours.  No results found for this or any previous visit (from the past 240 hours).    Radiology Studies: No results found.   Scheduled Meds:  allopurinol   100 mg Oral QPM   aspirin   EC  81 mg Oral Daily   cycloSPORINE   1 drop Both Eyes BID   diclofenac  Sodium  2 g Topical QID   docusate sodium   100 mg Oral BID   feeding supplement  237 mL Oral TID BM   ferrous sulfate   325 mg Oral Daily   itraconazole   200 mg Oral Q24H   metoprolol  tartrate  12.5 mg Oral BID   multivitamin with minerals  1 tablet Oral Daily   nicotine   7 mg Transdermal Daily   pantoprazole   40 mg Oral BID   sucralfate   1 g Oral Q8H   traZODone   50 mg Oral QHS  triamcinolone    Mouth/Throat TID   Continuous Infusions:  thiamine  (VITAMIN B1) injection 250 mg (05/15/23 0957)     LOS: 11 days   Fredia Skeeter, MD [facility]  05/15/2023, 12:57 PM   *Please note that this is a verbal dictation therefore any spelling or grammatical errors are due to the Dragon Medical One system interpretation.  Please page via Amion and do not message via secure chat for urgent patient care matters. Secure chat can be used for non urgent patient care matters.  How to contact the [facility] Attending or Consulting provider 7A - 7P or covering provider during after hours 7P -7A, for this patient?  Check the care team in [facility] and look for a) attending/consulting [facility] provider listed and b) the [facility] team listed. Page or secure chat 7A-7P. Log into [email or web address] and use [facility] universal password to access. If you do not have the password, please contact the hospital operator. Locate the [facility] provider you are looking for under [facility] and page to a number that you can be directly reached. If you still have difficulty reaching the provider, please page the [facility] (Director on Call) for the Hospitalists listed on amion for assistance.

## 2023-05-15 NOTE — Transfer of Care (Signed)
 Immediate Anesthesia Transfer of Care Note  Patient: [facility]  Procedure(s) Performed: ESOPHAGOGASTRODUODENOSCOPY (EGD) WITH PROPOFOL  SAVORY DILATION  Patient Location: PACU  Anesthesia Type:MAC  Level of Consciousness: awake, drowsy, patient cooperative, and responds to stimulation  Airway & Oxygen Therapy: Patient Spontanous Breathing  Post-op Assessment: Report given to RN and Post -op Vital signs reviewed and stable  Post vital signs: Reviewed and stable  Last Vitals:  Vitals Value Taken Time  BP 82/57 05/15/23 0803  Temp    Pulse 83 05/15/23 0804  Resp 33 05/15/23 0804  SpO2 100 % 05/15/23 0804  Vitals shown include unfiled device data.  Last Pain:  Vitals:   05/15/23 0729  TempSrc: Temporal  PainSc: 0-No pain      Patients Stated Pain Goal: 2 (05/08/23 2135)  Complications: No notable events documented.

## 2023-05-15 NOTE — Op Note (Signed)
 [facility] Patient Name: Holly Guerra Procedure Date : 05/15/2023 MRN: [id number] Attending MD: Belvie Just , MD, [id number] Date of Birth: [birth year] CSN: [id number] Age: 83 Admit Type: Inpatient Procedure:                Upper GI endoscopy Indications:              Abnormal UGI series Providers:                Belvie Just, MD, Collene Edu, RN, Felice Sar,                            Technician, Haskel Chris, Technician Referring MD:              Medicines:                Propofol  per Anesthesia Complications:            No immediate complications. Estimated Blood Loss:     Estimated blood loss was minimal. Procedure:                Pre-Anesthesia Assessment:                           - Prior to the procedure, a History and Physical                            was performed, and patient medications and                            allergies were reviewed. The patient's tolerance of                            previous anesthesia was also reviewed. The risks                            and benefits of the procedure and the sedation                            options and risks were discussed with the patient.                            All questions were answered, and informed consent                            was obtained. Prior Anticoagulants: The patient has                            taken heparin , last dose was day of procedure. ASA                            Grade Assessment: III - A patient with severe                            systemic disease. After reviewing the risks and  benefits, the patient was deemed in satisfactory                            condition to undergo the procedure.                           - Sedation was administered by an anesthesia                            professional. Deep sedation was attained.                           After obtaining informed consent, the endoscope was                            passed under  direct vision. Throughout the                            procedure, the patient's blood pressure, pulse, and                            oxygen saturations were monitored continuously. The                            [id number]-H190 ([id number]) Olympus endoscope was introduced                            through the mouth, and advanced to the second part                            of duodenum. The upper GI endoscopy was somewhat                            difficult due to the patient's discomfort during                            the procedure. Successful completion of the                            procedure was aided by increasing the dose of                            sedation medication. The patient tolerated the                            procedure well. Scope In: Scope Out: Findings:      One benign-appearing, intrinsic mild stenosis was found at the       cricopharyngeus. This stenosis measured 1.3 cm (inner diameter) x 1 cm       (in length). The stenosis was traversed. A guidewire was placed and the       scope was withdrawn. Dilation was performed with a Savary dilator with       no resistance at 16 mm. The dilation site was examined following  endoscope reinsertion and showed complete resolution of luminal       narrowing. Estimated blood loss was minimal.      A 3 cm hiatal hernia was present.      A medium amount of food (residue) was found in the gastric body.      The examined duodenum was normal.      During the initial inspection of the esophagus it was noted to be widely       patent. 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 At this point in the evaluation the       patient was coughing significantly and she also pushed out the bite       block. She bit down on the endoscope and cause some minor trauma to her       tongue. With increased sedation the bite block was reinserted and       secured. Dilation with a 16 mm  Savary was performed. A relook showed       that there was a shallow mucosal break at the UES, which correlates with       the esophagram images. Impression:               - Benign-appearing esophageal stenosis. Dilated.                           - 3 cm hiatal hernia.                           - A medium amount of food (residue) in the stomach.                           - Normal examined duodenum.                           - No specimens collected. Recommendation:           - Return patient to hospital ward for ongoing care.                           - Resume regular diet.                           - Continue present medications.                           - Restart low dose heparin  in 24 hours, although 48                            hours, if possible, will be optimal.                           - PPI QD. Procedure Code(s):        --- Professional ---                           [id number], Esophagogastroduodenoscopy, flexible,  transoral; with insertion of guide wire followed by                            passage of dilator(s) through esophagus over guide                            wire Diagnosis Code(s):        --- Professional ---                           K22.2, Esophageal obstruction                           K44.9, Diaphragmatic hernia without obstruction or                            gangrene                           R93.3, Abnormal findings on diagnostic imaging of                            other parts of digestive tract CPT copyright 2022 American [employer] Association. All rights reserved. The codes documented in this report are preliminary and upon coder review may  be revised to meet current compliance requirements. Belvie Just, MD Belvie Just, MD 05/15/2023 8:09:48 AM This report has been signed electronically. Number of Addenda: 0

## 2023-05-16 DIAGNOSIS — Z515 Encounter for palliative care: Secondary | ICD-10-CM | POA: Diagnosis not present

## 2023-05-16 DIAGNOSIS — Z7189 Other specified counseling: Secondary | ICD-10-CM | POA: Diagnosis not present

## 2023-05-16 DIAGNOSIS — Z66 Do not resuscitate: Secondary | ICD-10-CM

## 2023-05-16 DIAGNOSIS — I5041 Acute combined systolic (congestive) and diastolic (congestive) heart failure: Secondary | ICD-10-CM | POA: Diagnosis not present

## 2023-05-16 LAB — CBC
HCT: 26.8 % — ABNORMAL LOW (ref 36.0–46.0)
Hemoglobin: 8.3 g/dL — ABNORMAL LOW (ref 12.0–15.0)
MCH: 20.9 pg — ABNORMAL LOW (ref 26.0–34.0)
MCHC: 31 g/dL (ref 30.0–36.0)
MCV: 67.3 fL — ABNORMAL LOW (ref 80.0–100.0)
Platelets: 235 10*3/uL (ref 150–400)
RBC: 3.98 MIL/uL (ref 3.87–5.11)
RDW: 19.8 % — ABNORMAL HIGH (ref 11.5–15.5)
WBC: 5.6 10*3/uL (ref 4.0–10.5)
nRBC: 0 % (ref 0.0–0.2)

## 2023-05-16 MED ORDER — LORAZEPAM 2 MG/ML IJ SOLN
0.2500 mg | Freq: Once | INTRAMUSCULAR | Status: AC
  Administered 2023-05-16: 0.25 mg via INTRAVENOUS
  Filled 2023-05-16: qty 1

## 2023-05-16 MED ORDER — NICOTINE 21 MG/24HR TD PT24
21.0000 mg | MEDICATED_PATCH | Freq: Every day | TRANSDERMAL | Status: DC
  Administered 2023-05-16 – 2023-05-18 (×3): 21 mg via TRANSDERMAL
  Filled 2023-05-16 (×3): qty 1

## 2023-05-16 MED ORDER — BUSPIRONE HCL 5 MG PO TABS
5.0000 mg | ORAL_TABLET | Freq: Three times a day (TID) | ORAL | Status: DC
  Administered 2023-05-16 – 2023-05-19 (×9): 5 mg via ORAL
  Filled 2023-05-16 (×9): qty 1

## 2023-05-16 NOTE — TOC Progression Note (Signed)
 Transition of Care [facility]) - Progression Note    Patient Details  Name: Holly Guerra MRN: [id number] Date of Birth: 10/13/1940  [facility] CM/SW Contact  Arlana JINNY Nicholaus ISRA[place] Phone Number: [phone] 05/16/2023, 2:29 PM  Clinical Narrative: CSW met with pt, pts son, pts wife, and spoke with pts daughter over the phone. The family would like to move forward with [facility]. CSW explained the SNF process. CSW explained the ins shara will be started closer to dc.   TOC will continue following.       Expected Discharge Plan:  (Home health vs SNF) Barriers to Discharge: SNF Pending bed offer  Expected Discharge Plan and Services       Living arrangements for the past 2 months: Single Family Home                                       Social Determinants of Health (SDOH) Interventions SDOH Screenings   Food Insecurity: No Food Insecurity (05/04/2023)  Housing: Low Risk  (05/06/2023)  Transportation Needs: No Transportation Needs (05/06/2023)  Utilities: Not At Risk (05/04/2023)  Alcohol Screen: Low Risk  (05/06/2023)  Financial Resource Strain: Low Risk  (05/06/2023)  Tobacco Use: High Risk (05/15/2023)    Readmission Risk Interventions     No data to display

## 2023-05-16 NOTE — Progress Notes (Addendum)
 Pt woke up this morning confused, attempting to get out of bed and wanting to go home. PRN haldol  was given but without relief.  Becomes very agitated and insisting to leave the hospital. Pt disoriented to date and situation. Daughter [name] was called. On call doctor paged. 0.25mg  IV ativan  given as per MD order

## 2023-05-16 NOTE — Progress Notes (Addendum)
   Palliative Medicine Inpatient Follow Up Note HPI: [name] with history of aortic insufficiency, peripheral vascular disease, GERD, essential hypertension, pernicious anemia presented to the ED yesterday with multiple symptoms, progressive shortness of breath X 1 week, some swelling, cough and orthopnea, in addition also complained of congestion sinus pain and headaches.    The Palliative care team has been asked to get involved to support additional goals of care conversations, code status, and discuss patients ongoing adult failure to thrive.   Today's Discussion 05/16/2023  *Please note that this is a verbal dictation therefore any spelling or grammatical errors are due to the Dragon Medical One system interpretation.  Chart reviewed inclusive of vital signs, progress notes, laboratory results, and diagnostic images.   Holly Guerra was quite delirious and agitated overnight per chart review.  I met with Holly Guerra at bedside this morning, she was noted to be sleepy but arousable.   Holly Guerra's PO intake does appear improved.  _______________________________ Addendum:  I called and spoke to patients daughter, Holly Guerra this morning. She shares that she and her brother do not feel they will be able to care for Holly Guerra at home given her agitation and how she is now acting mean. Holly Guerra shares it is so hard for her but she feels that Holly Guerra needs to be in an environment where her needs can be adequately met. She notes that Holly Guerra has medicare and medicaid.  We discussed the option of palliative care given patients improvement(s) in PO's. Reviewed the option of palliative support and how if patient should decline the transition to hospice.  Discussed resuscitation status. Patients daughter is in agreement with DNAR/DNI.  Questions and concerns addressed/Palliative Support Provided.   Objective Assessment: Vital Signs Vitals:   05/15/23 1944 05/16/23 0337  BP: 104/65 123/76  Pulse: 74 85  Resp: 20 20   Temp: 97.9 F (36.6 C) 98.2 F (36.8 C)  SpO2: 92%     Intake/Output Summary (Last 24 hours) at 05/16/2023 1106 Last data filed at 05/16/2023 0859 Gross per 24 hour  Intake 589.91 ml  Output 200 ml  Net 389.91 ml   Last Weight  Most recent update: 05/16/2023  3:37 AM    Weight  40.8 kg (89 lb 14.4 oz)            Gen:  Elderly AA F chronically ill appearing HEENT: moist mucous membranes CV: Regular rate and irregular rhythm  PULM:  On RA, breathing is even and nonlabored ABD: soft/nontender  EXT: No edema  Neuro: Alert and oriented to person and place, thinks she came in for sinus congestion  SUMMARY OF RECOMMENDATIONS   DNAR/DNI  Initiate delirium precautions - Added low dose buspar  5mg  PO TID; Increase nicotine  patch to 21mg  Q16H  Plan for transition to SNF once medically optimized, with Palliative support through [facility]  Appreciate TOC helping family with long term care options   Ongoing incremental PMT support  Billing based on MDM: High ______________________________________________________________________________________ Holly Guerra [place] Palliative Medicine Team Team Cell Phone: [phone] Please utilize secure chat with additional questions, if there is no response within 30 minutes please call the above phone number  Palliative Medicine Team providers are available by phone from 7am to 7pm daily and can be reached through the team cell phone.  Should this patient require assistance outside of these hours, please call the patient's attending physician.

## 2023-05-16 NOTE — Progress Notes (Addendum)
 Subjective: Confusion reported this AM.  Objective: Vital signs in last 24 hours: Temp:  [97.3 F (36.3 C)-98.2 F (36.8 C)] 98.2 F (36.8 C) (01/05 0337) Pulse Rate:  [60-85] 85 (01/05 0337) Resp:  [14-22] 20 (01/05 0337) BP: (88-123)/(56-76) 123/76 (01/05 0337) SpO2:  [92 %-99 %] 92 % (01/04 1944) Weight:  [40.8 kg] 40.8 kg (01/05 0333) Last BM Date : 05/15/23  Intake/Output from previous day: 01/04 0701 - 01/05 0700 In: 234.9 [I.V.:125; IV Piggyback:109.9] Out: 200 [Urine:200] Intake/Output this shift: No intake/output data recorded.  General appearance: confused GI: soft, non-tender; bowel sounds normal; no masses,  no organomegaly  Lab Results: Recent Labs    05/14/23 0537 05/15/23 0338 05/16/23 0358  WBC 4.4 4.4 5.6  HGB 9.1* 8.2* 8.3*  HCT 29.7* 26.3* 26.8*  PLT 270 250 235   BMET Recent Labs    05/14/23 1042  NA 139  K 4.0  CL 106  CO2 24  GLUCOSE 146*  BUN 11  CREATININE 0.97  CALCIUM  9.1   LFT No results for input(s): PROT, ALBUMIN, AST, ALT, ALKPHOS, BILITOT, BILIDIR, IBILI in the last 72 hours. PT/INR No results for input(s): LABPROT, INR in the last 72 hours. Hepatitis Panel No results for input(s): HEPBSAG, HCVAB, HEPAIGM, HEPBIGM in the last 72 hours. C-Diff No results for input(s): CDIFFTOX in the last 72 hours. Fecal Lactopherrin No results for input(s): FECLLACTOFRN in the last 72 hours.  Studies/Results: No results found.  Medications: Scheduled:  allopurinol   100 mg Oral QPM   aspirin  EC  81 mg Oral Daily   cycloSPORINE   1 drop Both Eyes BID   diclofenac  Sodium  2 g Topical QID   docusate sodium   100 mg Oral BID   feeding supplement  237 mL Oral TID BM   ferrous sulfate   325 mg Oral Daily   itraconazole   200 mg Oral Q24H   metoprolol  tartrate  12.5 mg Oral BID   multivitamin with minerals  1 tablet Oral Daily   nicotine   7 mg Transdermal Daily   pantoprazole   40 mg Oral BID   sucralfate   1  g Oral Q8H   traZODone   50 mg Oral QHS   triamcinolone    Mouth/Throat TID   Continuous:  thiamine  (VITAMIN B1) injection Stopped (05/15/23 1039)    Assessment/Plan: 1) Esophageal stricture s/p dilation. 2) Confusion this AM. 3) Anemia - stable.   With her confusion it was difficult to assess if she had any improvement after the dilation.  There was no evidence of any complications.  Plan: 1) Supportive care. 2) PPI every day. 3) Okay to restart anticoagulation tomorrow. 4) [facility] will resume care in the AM.  LOS: 12 days   Sheela Mcculley D 05/16/2023, 8:28 AM

## 2023-05-16 NOTE — Plan of Care (Signed)
  Problem: Education: Goal: Knowledge of General Education information will improve Description: Including pain rating scale, medication(s)/side effects and non-pharmacologic comfort measures Outcome: Progressing   Problem: Health Behavior/Discharge Planning: Goal: Ability to manage health-related needs will improve Outcome: Progressing   Problem: Clinical Measurements: Goal: Ability to maintain clinical measurements within normal limits will improve Outcome: Progressing Goal: Will remain free from infection Outcome: Progressing Goal: Diagnostic test results will improve Outcome: Progressing Goal: Respiratory complications will improve Outcome: Progressing Goal: Cardiovascular complication will be avoided Outcome: Progressing   Problem: Activity: Goal: Risk for activity intolerance will decrease Outcome: Progressing   Problem: Nutrition: Goal: Adequate nutrition will be maintained Outcome: Progressing   Problem: Coping: Goal: Level of anxiety will decrease Outcome: Progressing   Problem: Elimination: Goal: Will not experience complications related to bowel motility Outcome: Progressing Goal: Will not experience complications related to urinary retention Outcome: Progressing   Problem: Pain Management: Goal: General experience of comfort will improve Outcome: Progressing   Problem: Safety: Goal: Ability to remain free from injury will improve Outcome: Progressing   Problem: Skin Integrity: Goal: Risk for impaired skin integrity will decrease Outcome: Progressing   Problem: Education: Goal: Ability to demonstrate management of disease process will improve Outcome: Progressing Goal: Ability to verbalize understanding of medication therapies will improve Outcome: Progressing Goal: Individualized Educational Video(s) Outcome: Progressing   Problem: Activity: Goal: Capacity to carry out activities will improve Outcome: Progressing   Problem: Cardiac: Goal:  Ability to achieve and maintain adequate cardiopulmonary perfusion will improve Outcome: Progressing   Problem: Education: Goal: Ability to demonstrate management of disease process will improve Outcome: Progressing Goal: Ability to verbalize understanding of medication therapies will improve Outcome: Progressing Goal: Individualized Educational Video(s) Outcome: Progressing   Problem: Activity: Goal: Capacity to carry out activities will improve Outcome: Progressing   Problem: Cardiac: Goal: Ability to achieve and maintain adequate cardiopulmonary perfusion will improve Outcome: Progressing

## 2023-05-16 NOTE — Progress Notes (Signed)
 Mobility Specialist Progress Note:    05/16/23 1237  Mobility  Activity Ambulated with assistance in hallway  Level of Assistance Contact guard assist, steadying assist  Assistive Device Four wheel walker  Distance Ambulated (ft) 300 ft  Activity Response Tolerated well  Mobility Referral Yes  Mobility visit 1 Mobility  Mobility Specialist Start Time (ACUTE ONLY) 1200  Mobility Specialist Stop Time (ACUTE ONLY) 1213  Mobility Specialist Time Calculation (min) (ACUTE ONLY) 13 min   Received pt in bed having no complaints and agreeable to mobility. Pt was asymptomatic throughout ambulation and returned to room w/o fault. Left in bed w/ call bell in reach and all needs met.   Thersia Minder Mobility Specialist  Please contact vis Secure Chat or  Rehab Office [phone]

## 2023-05-16 NOTE — Progress Notes (Signed)
 PROGRESS NOTE    Holly Guerra  FMW:[id number] DOB: 1940/11/27 DOA: 05/03/2023 PCP: Elvera Ozell SAUNDERS, PA-C   Brief Narrative:  Holly Guerra was admitted to the hospital with the working diagnosis of acute heart failure in the setting of influenza A infection.    [name] with history of aortic insufficiency, peripheral vascular disease, GERD, essential hypertension, pernicious anemia presented to the ED yesterday with multiple symptoms, progressive shortness of breath X 1 week, some swelling, cough and orthopnea, in addition also complained of congestion sinus pain and headaches. -In the ED she was noted to be mildly hypoxic, BNP 4119, respiratory virus panel positive for influenza A, hemoglobin 9.2, creatinine 1.03,    Chest radiograph with cardiomegaly with bilateral hilar vascular congestion and cephalization of the vasculature.    EKG 89 bpm, left axis deviation, left anterior fascicular block, qtc 508, sinus rhythm with poor R R wave progression, no significant ST segment changes and negative T wave V5 and V6.   Assessment & Plan:   Principal Problem:   Acute heart failure (HCC) Active Problems:   Influenza with respiratory manifestation   Essential hypertension   AKI (acute kidney injury) (HCC)   Pernicious anemia   GERD (gastroesophageal reflux disease)   PVD (peripheral vascular disease) (HCC)   Protein-calorie malnutrition, severe   Dysphagia   Abnormal barium swallow   Antiplatelet or antithrombotic long-term use   Loss of weight  Failure to Thrive / Weight Loss  Poor PO Intake / Odynophagia/esophageal stricture/ Glossitis  Concern for possible oropharyngeal candidiasis Apparently she has greyish/brown coating on tongue as well as ulcerations with grey slough.  Not classic appearance for thrush, but treating empirically for oropharyngeal and esophageal candidiasis with itraconazole  with her pain/discomfort (no fluconazole  due to plavix ). 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 GI consulted, Plavix  stopped, had 5 days washout, underwent EGD by Dr. Rollin 05/15/2023 with esophageal dilatation which appeared not benign per GI notes.  They recommended resuming heparin  and Plavix  05/17/2023.  Hemoglobin stable. B12 wnl. CT abdomen/pelvis without acute abnormality (limited eval due to motion and streak artifact) RD previously recommended cortrak, daughter [name] wasn't interested with concern she may pull this.  Will avoid for now.  Will see how she does with above measures. Also, seen by Dr. Roark of ENT for stomatitis on 05/12/2023, per him, it was unclear of the etiology of stomatitis but systemic issues likely stress and vitamin deficiency may be the cause.  He recommended trying antifungal first, patient already is on itraconazole  however I added topical Kenalog  per his recommendations.   Atrial Fibrillation with RVR Back in sinus, on oral metoprolol  and IV heparin  for now.   Acute Metabolic Encephalopathy Dysarthria  Per discussion with daughter, waxing/waning encephalopathy, dysarthria for the past week. Likely due to influenza infection. head CT -> progressive atrophy and white matter disease, advanced for age High dose thiamine .  Confusion this morning noted by nursing staff.  I think she likely has underlying dementia when she is having intermittent hospital-acquired delirium.  Currently she is alert and oriented to self only.  Indeterminate Splenic Lesion Consider postcontrast CT vs MR   New onset of congestive heart failure (HCC) Echo with EF 45-50%, regional wall motion abnormalities, grade II diastolic dysfunction, moderate AS  Spironolactone , jardiance  Lasix     Influenza A with pneumonia No signs of bacterial superinfection.  Completed oseltamivir  for antiviral therapy.    Regional  Wall Motion Abnormality Plan for outpatient cardiology  follow up    Right Foot Pain  Peripheral Vascular Disease Hx L SFA stent  Related to peripheral vascular disease?  Palpable pulse Will monitor for now.  She had no complaints today.   AKI (acute kidney injury) (HCC) ruled out.  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  Last known normal creatinine record was more than 45 months old.   Hypokalemia.  Resolved   Essential hypertension Patient is on metoprolol  and blood pressure is controlled.   Pernicious anemia Cell count has been stable.  Patient on B12 injections q 30 days.    Iron deficiency anemia, continue with oral iron supplementation.    GERD (gastroesophageal reflux disease) Continue with pantoprazole .    PVD (peripheral vascular disease) (HCC) Continue blood pressure control  Patient on aspirin , and clopidogrel  (on hold) Hold cilostazol  in the setting of heart failure.   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  DVT prophylaxis: SCDs Start: 05/04/23 0004 Place TED hose Start: 05/04/23 0004   Code Status: Full Code  Family Communication:  None present at bedside.   Status is: Inpatient Remains inpatient appropriate because: Need to start back on Plavix  and anticoagulation tomorrow based on hemoglobin, if so, she will be ready for discharge tomorrow.   Estimated body mass index is 16.44 kg/m as calculated from the following:   Height as of this encounter: 5' 2 (1.575 m).   Weight as of this encounter: 40.8 kg.    Nutritional Assessment: Body mass index is 16.44 kg/m.Holly Guerra Seen by dietician.  I agree with the assessment and plan as outlined below: Nutrition Status: Nutrition Problem: Severe Malnutrition Etiology: chronic illness Signs/Symptoms: severe muscle  depletion, severe fat depletion Interventions: Refer to RD note for recommendations  . Skin Assessment: I have examined the patient's skin and I agree with the wound assessment as performed by the wound care RN as outlined below:    Consultants:  GI   Procedures:  As above  Antimicrobials:  Anti-infectives (From admission, onward)    Start     Dose/Rate Route Frequency Ordered Stop   05/09/23 1800  itraconazole  (SPORANOX ) 10 MG/ML solution 200 mg  Status:  Discontinued        200 mg Oral Every 24 hours 05/09/23 1616 05/09/23 1624   05/09/23 1800  itraconazole  (SPORANOX ) capsule 200 mg        200 mg Oral Every 24 hours 05/09/23 1624     05/04/23 0100  oseltamivir  (TAMIFLU ) capsule 30 mg        30 mg Oral Daily 05/04/23 0004 05/08/23 0835         Subjective: Seen and examined.  No complaints.  She is sitting at the edge of the bedside.  In pleasant mood.  Tongue pain is resolved.  Objective: Vitals:   05/15/23 1444 05/15/23 1944 05/16/23 0333 05/16/23 0337  BP: 107/60 104/65  123/76  Pulse: 72 74  85  Resp: (!) 22 20  20   Temp: (!) 97.3 F (36.3 C) 97.9 F (36.6 C)  98.2 F (36.8 C)  TempSrc: Oral Oral  Oral  SpO2: 99% 92%    Weight:   40.8 kg   Height:        Intake/Output Summary (  Last 24 hours) at 05/16/2023 0835 Last data filed at 05/15/2023 1849 Gross per 24 hour  Intake 109.91 ml  Output 200 ml  Net -90.09 ml   Filed Weights   05/14/23 0523 05/15/23 0346 05/16/23 0333  Weight: 39.3 kg 40.4 kg 40.8 kg    Examination:  General exam: Appears calm and comfortable  Respiratory system: Clear to auscultation. Respiratory effort normal. Cardiovascular system: S1 & S2 heard, RRR. No JVD, murmurs, rubs, gallops or clicks. No pedal edema. Gastrointestinal system: Abdomen is nondistended, soft and nontender. No organomegaly or masses felt. Normal bowel sounds heard. Central nervous system: Alert and oriented x 1. No focal neurological deficits. Extremities:  Symmetric 5 x 5 power. Skin: No rashes, lesions or ulcers.   Data Reviewed: I have personally reviewed following labs and imaging studies  CBC: Recent Labs  Lab 05/10/23 0401 05/11/23 0745 05/13/23 0422 05/14/23 0537 05/15/23 0338 05/16/23 0358  WBC 6.2 5.3 5.1 4.4 4.4 5.6  NEUTROABS 4.1 3.9 3.2  --   --   --   HGB 8.7* 9.1* 9.3* 9.1* 8.2* 8.3*  HCT 28.5* 29.6* 30.2* 29.7* 26.3* 26.8*  MCV 67.4* 67.0* 67.6* 67.5* 67.1* 67.3*  PLT 310 299 268 270 250 235   Basic Metabolic Panel: Recent Labs  Lab 05/10/23 0401 05/11/23 0745 05/13/23 0422 05/14/23 1042  NA 135 139 140 139  K 4.2 3.9 3.8 4.0  CL 101 103 104 106  CO2 24 27 26 24   GLUCOSE 94 90 142* 146*  BUN 11 10 8 11   CREATININE 1.14* 1.15* 1.07* 0.97  CALCIUM  9.1 9.1 9.3 9.1  MG 1.8 1.9 2.0  --   PHOS 3.0 2.8 2.5  --    GFR: Estimated Creatinine Clearance: 29.3 mL/min (by C-G formula based on SCr of 0.97 mg/dL). Liver Function Tests: Recent Labs  Lab 05/10/23 0401 05/11/23 0745 05/13/23 0422  AST 38 38 28  ALT 34 31 30  ALKPHOS 56 56 55  BILITOT 0.6 0.8 0.4  PROT 6.7 6.8 6.7  ALBUMIN 2.7* 2.7* 2.8*   No results for input(s): LIPASE, AMYLASE in the last 168 hours. No results for input(s): AMMONIA in the last 168 hours. Coagulation Profile: No results for input(s): INR, PROTIME in the last 168 hours. Cardiac Enzymes: No results for input(s): CKTOTAL, CKMB, CKMBINDEX, TROPONINI in the last 168 hours. BNP (last 3 results) No results for input(s): PROBNP in the last 8760 hours. HbA1C: No results for input(s): HGBA1C in the last 72 hours. CBG: No results for input(s): GLUCAP in the last 168 hours. Lipid Profile: No results for input(s): CHOL, HDL, LDLCALC, TRIG, CHOLHDL, LDLDIRECT in the last 72 hours. Thyroid  Function Tests: No results for input(s): TSH, T4TOTAL, FREET4, T3FREE, THYROIDAB in the last 72 hours. Anemia Panel: No results for input(s):  VITAMINB12, FOLATE, FERRITIN, TIBC, IRON, RETICCTPCT in the last 72 hours.  Sepsis Labs: No results for input(s): PROCALCITON, LATICACIDVEN in the last 168 hours.  No results found for this or any previous visit (from the past 240 hours).    Radiology Studies: No results found.   Scheduled Meds:  allopurinol   100 mg Oral QPM   aspirin  EC  81 mg Oral Daily   cycloSPORINE   1 drop Both Eyes BID   diclofenac  Sodium  2 g Topical QID   docusate sodium   100 mg Oral BID   feeding supplement  237 mL Oral TID BM   ferrous sulfate   325 mg Oral Daily   itraconazole   200  mg Oral Q24H   metoprolol  tartrate  12.5 mg Oral BID   multivitamin with minerals  1 tablet Oral Daily   nicotine   7 mg Transdermal Daily   pantoprazole   40 mg Oral BID   sucralfate   1 g Oral Q8H   traZODone   50 mg Oral QHS   triamcinolone    Mouth/Throat TID   Continuous Infusions:  thiamine  (VITAMIN B1) injection Stopped (05/15/23 1039)     LOS: 12 days   Fredia Skeeter, MD [facility]  05/16/2023, 8:35 AM   *Please note that this is a verbal dictation therefore any spelling or grammatical errors are due to the Dragon Medical One system interpretation.  Please page via Amion and do not message via secure chat for urgent patient care matters. Secure chat can be used for non urgent patient care matters.  How to contact the [facility] Attending or Consulting provider 7A - 7P or covering provider during after hours 7P -7A, for this patient?  Check the care team in [facility] and look for a) attending/consulting [facility] provider listed and b) the [facility] team listed. Page or secure chat 7A-7P. Log into [email or web address] and use [place]'s universal password to access. If you do not have the password, please contact the hospital operator. Locate the [facility] provider you are looking for under [facility] and page to a number that you can be directly reached. If you still have difficulty reaching the provider, please  page the [facility] (Director on Call) for the Hospitalists listed on amion for assistance.

## 2023-05-17 ENCOUNTER — Other Ambulatory Visit (HOSPITAL_COMMUNITY): Payer: Self-pay

## 2023-05-17 ENCOUNTER — Telehealth (HOSPITAL_COMMUNITY): Payer: Self-pay | Admitting: Pharmacy Technician

## 2023-05-17 DIAGNOSIS — R1310 Dysphagia, unspecified: Secondary | ICD-10-CM | POA: Diagnosis not present

## 2023-05-17 DIAGNOSIS — Z7189 Other specified counseling: Secondary | ICD-10-CM | POA: Diagnosis not present

## 2023-05-17 DIAGNOSIS — Z515 Encounter for palliative care: Secondary | ICD-10-CM | POA: Diagnosis not present

## 2023-05-17 DIAGNOSIS — F419 Anxiety disorder, unspecified: Secondary | ICD-10-CM

## 2023-05-17 DIAGNOSIS — I5041 Acute combined systolic (congestive) and diastolic (congestive) heart failure: Secondary | ICD-10-CM | POA: Diagnosis not present

## 2023-05-17 DIAGNOSIS — K222 Esophageal obstruction: Secondary | ICD-10-CM

## 2023-05-17 DIAGNOSIS — I5021 Acute systolic (congestive) heart failure: Secondary | ICD-10-CM | POA: Diagnosis not present

## 2023-05-17 LAB — CBC
HCT: 26.7 % — ABNORMAL LOW (ref 36.0–46.0)
Hemoglobin: 8 g/dL — ABNORMAL LOW (ref 12.0–15.0)
MCH: 20.3 pg — ABNORMAL LOW (ref 26.0–34.0)
MCHC: 30 g/dL (ref 30.0–36.0)
MCV: 67.6 fL — ABNORMAL LOW (ref 80.0–100.0)
Platelets: 217 10*3/uL (ref 150–400)
RBC: 3.95 MIL/uL (ref 3.87–5.11)
RDW: 19.3 % — ABNORMAL HIGH (ref 11.5–15.5)
WBC: 5.3 10*3/uL (ref 4.0–10.5)
nRBC: 0 % (ref 0.0–0.2)

## 2023-05-17 MED ORDER — FLUCONAZOLE 100 MG PO TABS
100.0000 mg | ORAL_TABLET | Freq: Every day | ORAL | Status: DC
  Administered 2023-05-17 – 2023-05-19 (×3): 100 mg via ORAL
  Filled 2023-05-17 (×3): qty 1

## 2023-05-17 MED ORDER — APIXABAN 2.5 MG PO TABS
2.5000 mg | ORAL_TABLET | Freq: Two times a day (BID) | ORAL | Status: DC
  Administered 2023-05-17 – 2023-05-19 (×5): 2.5 mg via ORAL
  Filled 2023-05-17 (×5): qty 1

## 2023-05-17 MED ORDER — CLOPIDOGREL BISULFATE 75 MG PO TABS
75.0000 mg | ORAL_TABLET | Freq: Every day | ORAL | Status: DC
  Administered 2023-05-17 – 2023-05-19 (×3): 75 mg via ORAL
  Filled 2023-05-17 (×3): qty 1

## 2023-05-17 MED ORDER — TRAMADOL HCL 50 MG PO TABS: 50.0000 mg | ORAL_TABLET | Freq: Three times a day (TID) | ORAL | 0 refills | Status: DC | PRN

## 2023-05-17 NOTE — Plan of Care (Signed)

## 2023-05-17 NOTE — Progress Notes (Signed)
 [facility] ROUNDING NOTE   Subjective: Feels better, ate breakfast with no difficulty   Objective: Vital signs in last 24 hours: Temp:  [97.7 F (36.5 C)-98.1 F (36.7 C)] 97.8 F (36.6 C) (01/06 0914) Pulse Rate:  [63-97] 97 (01/06 0914) Resp:  [17-20] 20 (01/06 0914) BP: (107-114)/(61-74) 114/62 (01/06 0914) SpO2:  [95 %] 95 % (01/06 0914) Weight:  [40.4 kg] 40.4 kg (01/06 0500) Last BM Date : 05/15/23   Intake/Output from previous day: 01/05 0701 - 01/06 0700 In: 720 [P.O.:720] Out: 200 [Urine:200] Intake/Output this shift: Total I/O In: 720 [P.O.:720] Out: -    Lab Results: Recent Labs    05/15/23 0338 05/16/23 0358 05/17/23 0318  WBC 4.4 5.6 5.3  HGB 8.2* 8.3* 8.0*  PLT 250 235 217  MCV 67.1* 67.3* 67.6*   BMET No results for input(s): NA, K, CL, CO2, GLUCOSE, BUN, CREATININE, CALCIUM  in the last 72 hours. LFT No results for input(s): PROT, ALBUMIN, AST, ALT, ALKPHOS, BILITOT, BILIDIR, IBILI in the last 72 hours. PT/INR No results for input(s): INR in the last 72 hours.    Imaging/Other results: No results found.    Assessment &Plan  83 year old female with new onset heart failure in the setting of influenza infection, dementia with intermittent mild solid dysphagia s/p EGD with dilation with improvement Continue diet as tolerated PPI daily Restart anticoagulation/Plavix    GI will sign off, available if have any questions    K. Wilkie Mcgee , MD [phone]  [facility]

## 2023-05-17 NOTE — Progress Notes (Signed)
 Physical Therapy Treatment Patient Details Name: Holly Guerra MRN: [id number] DOB: November 02, 1940 Today's Date: 05/17/2023   History of Present Illness 83 y.o. female presents to [facility] hospital on 12.23.2024 with SOB. Pt admitted for management of new onset CHF. Pt also found to be positive for flu. PMH includes aortic valve disease, PVD, GERD, anemia, HTN.    PT Comments  Pt received in bed, pleasant and agreeable to participation in therapy. She required supervision bed mobility, CGA transfers, and CG/min assist amb 200' with rollator. Pt returned to bed at end of session with bed in semi chair position. VSS on RA.    If plan is discharge home, recommend the following: A little help with walking and/or transfers;A little help with bathing/dressing/bathroom;Assistance with cooking/housework;Assist for transportation;Help with stairs or ramp for entrance;Direct supervision/assist for medications management;Direct supervision/assist for financial management;Supervision due to cognitive status   Can travel by private vehicle     Yes  Equipment Recommendations  Rollator (4 wheels)    Recommendations for Other Services       Precautions / Restrictions Precautions Precautions: Fall;Other (comment) Precaution Comments: delirium     Mobility  Bed Mobility Overal bed mobility: Needs Assistance Bed Mobility: Supine to Sit, Sit to Supine     Supine to sit: Supervision, HOB elevated, Used rails Sit to supine: Supervision, HOB elevated   General bed mobility comments: increased time, supervision for safety    Transfers Overall transfer level: Needs assistance Equipment used: Rollator (4 wheels) Transfers: Sit to/from Stand Sit to Stand: Contact guard assist                Ambulation/Gait Ambulation/Gait assistance: Contact guard assist, Min assist Gait Distance (Feet): 200 Feet Assistive device: Rollator (4 wheels) Gait Pattern/deviations: Step-through pattern, Drifts right/left,  Shuffle, Decreased stride length Gait velocity: decreased Gait velocity interpretation: <1.31 ft/sec, indicative of household ambulator   General Gait Details: Tends to drift R requiring min assist to correct. Otherwise, CGA for safety. Decreased foot clearance bilat. No LOB noted.   Stairs             Wheelchair Mobility     Tilt Bed    Modified Rankin (Stroke Patients Only)       Balance Overall balance assessment: Needs assistance Sitting-balance support: No upper extremity supported, Feet supported Sitting balance-Leahy Scale: Good     Standing balance support: Bilateral upper extremity supported, Reliant on assistive device for balance, During functional activity Standing balance-Leahy Scale: Poor                              Cognition Arousal: Alert Behavior During Therapy: WFL for tasks assessed/performed Overall Cognitive Status: Impaired/Different from baseline Area of Impairment: Attention, Problem solving, Safety/judgement, Awareness, Memory, Following commands                 Orientation Level: Disoriented to, Situation Current Attention Level: Selective Memory: Decreased short-term memory Following Commands: Follows one step commands consistently Safety/Judgement: Decreased awareness of safety, Decreased awareness of deficits Awareness: Emergent Problem Solving: Slow processing, Difficulty sequencing, Requires verbal cues General Comments: Pleasant and cooperative        Exercises      General Comments General comments (skin integrity, edema, etc.): VSS on RA      Pertinent Vitals/Pain Pain Assessment Pain Assessment: No/denies pain    Home Living  Prior Function            PT Goals (current goals can now be found in the care plan section) Acute Rehab PT Goals Patient Stated Goal: not stated Progress towards PT goals: Progressing toward goals    Frequency    Min  1X/week      PT Plan      Co-evaluation              AM-PAC PT 6 Clicks Mobility   Outcome Measure  Help needed turning from your back to your side while in a flat bed without using bedrails?: A Little Help needed moving from lying on your back to sitting on the side of a flat bed without using bedrails?: A Little Help needed moving to and from a bed to a chair (including a wheelchair)?: A Little Help needed standing up from a chair using your arms (e.g., wheelchair or bedside chair)?: A Little Help needed to walk in hospital room?: A Little Help needed climbing 3-5 steps with a railing? : A Little 6 Click Score: 18    End of Session Equipment Utilized During Treatment: Gait belt Activity Tolerance: Patient tolerated treatment well Patient left: in bed;with call bell/phone within reach;with bed alarm set Nurse Communication: Mobility status PT Visit Diagnosis: Other abnormalities of gait and mobility (R26.89);Muscle weakness (generalized) (M62.81);Pain;Unsteadiness on feet (R26.81)     Time: 9185-9157 PT Time Calculation (min) (ACUTE ONLY): 28 min  Charges:    $Gait Training: 23-37 mins PT General Charges $$ ACUTE PT VISIT: 1 Visit                     Sari MATSU., PT  Office # [phone]    Erven Sari Shaker 05/17/2023, 9:41 AM

## 2023-05-17 NOTE — Progress Notes (Signed)
   Palliative Medicine Inpatient Follow Up Note HPI: [name] with history of aortic insufficiency, peripheral vascular disease, GERD, essential hypertension, pernicious anemia presented to the ED yesterday with multiple symptoms, progressive shortness of breath X 1 week, some swelling, cough and orthopnea, in addition also complained of congestion sinus pain and headaches.    The Palliative care team has been asked to get involved to support additional goals of care conversations, code status, and discuss patients ongoing adult failure to thrive.   Today's Discussion 05/17/2023  *Please note that this is a verbal dictation therefore any spelling or grammatical errors are due to the Dragon Medical One system interpretation.  Chart reviewed inclusive of vital signs, progress notes, laboratory results, and diagnostic images.   I met with Geralda's RN, Nat this morning. She shares that Trenisha had an excellent night, she got up to use the commode a few times though she was not disoriented per her assessment. She was in good spirits overall.  I met with Halana this morning. She is far more clear minded and able to have a conversation though still deviates somewhat. She shares that she is awaiting her breakfast and otherwise feels well. Denies pain, nausea, or shortness of breath.  Plan for SNF placement once medically optimized with OP Palliative support.   Questions and concerns addressed/Palliative Support Provided.   Objective Assessment: Vital Signs Vitals:   05/17/23 0314 05/17/23 0914  BP: 114/74 114/62  Pulse: 63 97  Resp: 20 20  Temp: 98 F (36.7 C) 97.8 F (36.6 C)  SpO2:  95%    Intake/Output Summary (Last 24 hours) at 05/17/2023 1116 Last data filed at 05/17/2023 9083 Gross per 24 hour  Intake 960 ml  Output 200 ml  Net 760 ml   Last Weight  Most recent update: 05/17/2023  6:28 AM    Weight  40.4 kg (89 lb 1.1 oz)            Gen:  Elderly AA F chronically ill  appearing HEENT: moist mucous membranes CV: Regular rate and irregular rhythm  PULM:  On RA, breathing is even and nonlabored ABD: soft/nontender  EXT: No edema  Neuro: Alert and oriented to person and place, thinks she came in for sinus congestion  SUMMARY OF RECOMMENDATIONS   DNAR/DNI  Initiate delirium precautions -Continue low dose buspar  5mg  PO TID; Continue nicotine  patch to 21mg  Q16H  Plan for transition to SNF once medically optimized, with Palliative support through [facility]  Appreciate TOC helping family with long term care options --> Have communicated with Isaiah this morning    Ongoing incremental PMT support  Time: 35 ______________________________________________________________________________________ [name] [facility] Palliative Medicine Team Team Cell Phone: [phone] Please utilize secure chat with additional questions, if there is no response within 30 minutes please call the above phone number  Palliative Medicine Team providers are available by phone from 7am to 7pm daily and can be reached through the team cell phone.  Should this patient require assistance outside of these hours, please call the patient's attending physician.

## 2023-05-17 NOTE — TOC Progression Note (Addendum)
 Transition of Care [facility]) - Progression Note    Patient Details  Name: Holly Guerra MRN: [id number] Date of Birth: 09-18-1940  [facility]) CM/SW Contact  Holly Guerra, LCSWA Phone Number: 05/17/2023, 10:40 AM  Clinical Narrative:     CSW called Cheree with [facility] SNF to confirm SNF bed for patient. Cheree request for CSW to resend initial referral over for review. CSW resent the initial referral. CSW awaiting call back to confirm if facility can offer SNF bed for patient. CSW will continue to follow.  CSW started insurance authorization. Auth ID# [id number]. CSW will add facility choice once determined if facility can offer. Insurance authorization currently pending.  Update- CSW received consult from palliative. Palliative services to follow patient at SNF.CSW spoke with patients daughter Holly Guerra. Holly Guerra agreeable for CSW to make referral to [facility] for palliative services to follow patient at [facility]. CSW made referral to Cheri with [facility] for palliative services to follow patient at SNF.  Update- CSW received call back from Cheri with [facility] who informed CSW that facility can offer but will need insurance authorization and to be off off of haldol . CSW added facility choice to patients insurance authorization. Patients insurance authorization currently pending.   Expected Discharge Plan:  (Home health vs SNF) Barriers to Discharge: SNF Pending bed offer  Expected Discharge Plan and Services       Living arrangements for the past 2 months: Single Family Home                                       Social Determinants of Health (SDOH) Interventions SDOH Screenings   Food Insecurity: No Food Insecurity (05/04/2023)  Housing: Low Risk  (05/06/2023)  Transportation Needs: No Transportation Needs (05/06/2023)  Utilities: Not At Risk (05/04/2023)  Alcohol Screen: Low Risk  (05/06/2023)  Financial Resource Strain:  Low Risk  (05/06/2023)  Tobacco Use: High Risk (05/15/2023)    Readmission Risk Interventions     No data to display

## 2023-05-17 NOTE — Plan of Care (Signed)
 Will continue to monitor patient.

## 2023-05-17 NOTE — Progress Notes (Signed)
 PROGRESS NOTE    Holly Guerra  FMW:[id number] DOB: May 15, 1940 DOA: 05/03/2023 PCP: Elvera Ozell SAUNDERS, PA-C   Brief Narrative:  Holly Guerra was admitted to the hospital with the working diagnosis of acute heart failure in the setting of influenza A infection.    [name] with history of aortic insufficiency, peripheral vascular disease, GERD, essential hypertension, pernicious anemia presented to the ED yesterday with multiple symptoms, progressive shortness of breath X 1 week, some swelling, cough and orthopnea, in addition also complained of congestion sinus pain and headaches. -In the ED she was noted to be mildly hypoxic, BNP 4119, respiratory virus panel positive for influenza A, hemoglobin 9.2, creatinine 1.03,    Chest radiograph with cardiomegaly with bilateral hilar vascular congestion and cephalization of the vasculature.    EKG 89 bpm, left axis deviation, left anterior fascicular block, qtc 508, sinus rhythm with poor R R wave progression, no significant ST segment changes and negative T wave V5 and V6.   Assessment & Plan:   Principal Problem:   Acute heart failure (HCC) Active Problems:   Influenza with respiratory manifestation   Essential hypertension   AKI (acute kidney injury) (HCC)   Pernicious anemia   GERD (gastroesophageal reflux disease)   PVD (peripheral vascular disease) (HCC)   Protein-calorie malnutrition, severe   Dysphagia   Abnormal barium swallow   Antiplatelet or antithrombotic long-term use   Loss of weight  Failure to Thrive / Weight Loss  Poor PO Intake / Odynophagia/esophageal stricture/ Glossitis  Concern for possible oropharyngeal candidiasis Apparently she has greyish/brown coating on tongue as well as ulcerations with grey slough.  Not classic appearance for thrush, but treating empirically for oropharyngeal and esophageal candidiasis with itraconazole  with her pain/discomfort (no fluconazole  due to plavix ). 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 GI consulted, Plavix  stopped, had 5 days washout, underwent EGD by Dr. Rollin 05/15/2023 with esophageal dilatation which appeared not benign per GI notes.  They recommended resuming heparin  and Plavix  05/17/2023.  Hemoglobin stable. B12 wnl. CT abdomen/pelvis without acute abnormality (limited eval due to motion and streak artifact) RD previously recommended cortrak, daughter [name] wasn't interested with concern she may pull this.  Will avoid for now.  Will see how she does with above measures. Also, seen by Dr. Roark of ENT for stomatitis on 05/12/2023, per him, it was unclear of the etiology of stomatitis but systemic issues likely stress and vitamin deficiency may be the cause.  He recommended trying antifungal first, patient already is on itraconazole  however I added topical Kenalog  per his recommendations.  Patient's tongue pain and lesions are improving.   New onset Atrial Fibrillation with RVR Back in sinus, on oral metoprolol .  CHA2DS2-VASc score 6, due to high risk of thromboembolism, will start on anticoagulation and start on Eliquis  today as she has been cleared by GI.  Acute Metabolic Encephalopathy Dysarthria  Per discussion with daughter, waxing/waning encephalopathy, dysarthria for the past week. Likely due to influenza infection. head CT -> progressive atrophy and white matter disease, advanced for age High dose thiamine .  Confusion this morning noted by nursing staff.  I think she likely has underlying dementia when she is having intermittent hospital-acquired delirium.  Currently she is alert and oriented to self only.  Indeterminate Splenic Lesion Consider postcontrast CT vs MR   New onset of congestive heart failure (HCC) Echo with EF 45-50%, regional wall motion abnormalities, grade II  diastolic dysfunction, moderate AS  Spironolactone , jardiance  Lasix      Influenza A with pneumonia No signs of bacterial superinfection.  Completed oseltamivir  for antiviral therapy.    Regional Wall Motion Abnormality Plan for outpatient cardiology follow up    Right Foot Pain  Peripheral Vascular Disease Hx L SFA stent  Related to peripheral vascular disease?  Palpable pulse Will monitor for now.  She had no complaints today.   AKI (acute kidney injury) (HCC) ruled out.  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  Last known normal creatinine record was more than 30 months old.   Hypokalemia.  Resolved   Essential hypertension Patient is on metoprolol  and blood pressure is controlled.   Pernicious anemia Cell count has been stable.  Patient on B12 injections q 30 days.    Iron deficiency anemia, continue with oral iron supplementation.    GERD (gastroesophageal reflux disease) Continue with pantoprazole .    PVD (peripheral vascular disease) (HCC) Continue blood pressure control  Patient on aspirin , and Plavix , however Plavix  was on hold for EGD, resuming Plavix  today per GI recommendations.  However now that she is also on Eliquis , will stop aspirin  to reduce risk of GI bleed.  Cilostazol  on hold due to new diagnosis of CHF and will be discontinued at discharge.   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  DVT prophylaxis: SCDs Start: 05/04/23 0004 Place TED hose Start: 05/04/23 0004   Code Status: Limited: Do not attempt resuscitation (DNR) -DNR-LIMITED -Do Not Intubate/DNI   Family Communication:  None present at bedside.   Status  is: Inpatient Remains inpatient appropriate because: Need to start back on Plavix  and anticoagulation tomorrow based on hemoglobin, if so, she will be ready for discharge tomorrow.   Estimated body mass index is 16.29 kg/m as calculated from the following:   Height as of this encounter: 5' 2 (1.575 m).   Weight as of this encounter: 40.4 kg.    Nutritional Assessment: Body mass index is 16.29 kg/m.[name] Seen by dietician.  I agree with the assessment and plan as outlined below: Nutrition Status: Nutrition Problem: Severe Malnutrition Etiology: chronic illness Signs/Symptoms: severe muscle depletion, severe fat depletion Interventions: Refer to RD note for recommendations  . Skin Assessment: I have examined the patient's skin and I agree with the wound assessment as performed by the wound care RN as outlined below:    Consultants:  GI   Procedures:  As above  Antimicrobials:  Anti-infectives (From admission, onward)    Start     Dose/Rate Route Frequency Ordered Stop   05/09/23 1800  itraconazole  (SPORANOX ) 10 MG/ML solution 200 mg  Status:  Discontinued        200 mg Oral Every 24 hours 05/09/23 1616 05/09/23 1624   05/09/23 1800  itraconazole  (SPORANOX ) capsule 200 mg        200 mg Oral Every 24 hours 05/09/23 1624     05/04/23 0100  oseltamivir  (TAMIFLU ) capsule 30 mg        30 mg Oral Daily 05/04/23 0004 05/08/23 0835         Subjective: Patient  seen and examined.  Alert and oriented to self and place.  No complaints.  Tongue pain has resolved.  Surprisingly she was fully alert and oriented today.  Objective: Vitals:   05/16/23 1405 05/16/23 1937 05/17/23 0314 05/17/23 0500  BP: 114/61 107/62 114/74   Pulse: 80 79 63   Resp: 17 20 20    Temp: 97.7 F (36.5 C) 98.1 F (36.7 C) 98 F (36.7 C)   TempSrc: Oral Oral Oral   SpO2:      Weight:    40.4 kg  Height:        Intake/Output Summary (Last 24 hours) at 05/17/2023 0847 Last data filed at 05/17/2023  0629 Gross per 24 hour  Intake 720 ml  Output 200 ml  Net 520 ml   Filed Weights   05/15/23 0346 05/16/23 0333 05/17/23 0500  Weight: 40.4 kg 40.8 kg 40.4 kg    Examination:  General exam: Appears calm and comfortable  Respiratory system: Clear to auscultation. Respiratory effort normal. Cardiovascular system: S1 & S2 heard, RRR. No JVD, murmurs, rubs, gallops or clicks. No pedal edema. Gastrointestinal system: Abdomen is nondistended, soft and nontender. No organomegaly or masses felt. Normal bowel sounds heard. Central nervous system: Alert and oriented x 3. No focal neurological deficits. Extremities: Symmetric 5 x 5 power. Skin: No rashes, lesions or ulcers.    Data Reviewed: I have personally reviewed following labs and imaging studies  CBC: Recent Labs  Lab 05/11/23 0745 05/13/23 0422 05/14/23 0537 05/15/23 0338 05/16/23 0358 05/17/23 0318  WBC 5.3 5.1 4.4 4.4 5.6 5.3  NEUTROABS 3.9 3.2  --   --   --   --   HGB 9.1* 9.3* 9.1* 8.2* 8.3* 8.0*  HCT 29.6* 30.2* 29.7* 26.3* 26.8* 26.7*  MCV 67.0* 67.6* 67.5* 67.1* 67.3* 67.6*  PLT 299 268 270 250 235 217   Basic Metabolic Panel: Recent Labs  Lab 05/11/23 0745 05/13/23 0422 05/14/23 1042  NA 139 140 139  K 3.9 3.8 4.0  CL 103 104 106  CO2 27 26 24   GLUCOSE 90 142* 146*  BUN 10 8 11   CREATININE 1.15* 1.07* 0.97  CALCIUM  9.1 9.3 9.1  MG 1.9 2.0  --   PHOS 2.8 2.5  --    GFR: Estimated Creatinine Clearance: 29 mL/min (by C-G formula based on SCr of 0.97 mg/dL). Liver Function Tests: Recent Labs  Lab 05/11/23 0745 05/13/23 0422  AST 38 28  ALT 31 30  ALKPHOS 56 55  BILITOT 0.8 0.4  PROT 6.8 6.7  ALBUMIN 2.7* 2.8*   No results for input(s): LIPASE, AMYLASE in the last 168 hours. No results for input(s): AMMONIA in the last 168 hours. Coagulation Profile: No results for input(s): INR, PROTIME in the last 168 hours. Cardiac Enzymes: No results for input(s): CKTOTAL, CKMB, CKMBINDEX,  TROPONINI in the last 168 hours. BNP (last 3 results) No results for input(s): PROBNP in the last 8760 hours. HbA1C: No results for input(s): HGBA1C in the last 72 hours. CBG: No results for input(s): GLUCAP in the last 168 hours. Lipid Profile: No results for input(s): CHOL, HDL, LDLCALC, TRIG, CHOLHDL, LDLDIRECT in the last 72 hours. Thyroid  Function Tests: No results for input(s): TSH, T4TOTAL, FREET4, T3FREE, THYROIDAB in the last 72 hours. Anemia Panel: No results for input(s): VITAMINB12, FOLATE, FERRITIN, TIBC, IRON, RETICCTPCT in the last 72 hours.  Sepsis Labs: No results for input(s): PROCALCITON, LATICACIDVEN in the last 168 hours.  No results found for this or  any previous visit (from the past 240 hours).    Radiology Studies: No results found.   Scheduled Meds:  allopurinol   100 mg Oral QPM   apixaban   5 mg Oral BID   busPIRone   5 mg Oral TID   clopidogrel   75 mg Oral Daily   cycloSPORINE   1 drop Both Eyes BID   diclofenac  Sodium  2 g Topical QID   docusate sodium   100 mg Oral BID   feeding supplement  237 mL Oral TID BM   ferrous sulfate   325 mg Oral Daily   itraconazole   200 mg Oral Q24H   metoprolol  tartrate  12.5 mg Oral BID   multivitamin with minerals  1 tablet Oral Daily   nicotine   21 mg Transdermal Daily   pantoprazole   40 mg Oral BID   sucralfate   1 g Oral Q8H   traZODone   50 mg Oral QHS   triamcinolone    Mouth/Throat TID   Continuous Infusions:     LOS: 13 days   Fredia Skeeter, MD [facility]  05/17/2023, 8:47 AM   *Please note that this is a verbal dictation therefore any spelling or grammatical errors are due to the Dragon Medical One system interpretation.  Please page via Amion and do not message via secure chat for urgent patient care matters. Secure chat can be used for non urgent patient care matters.  How to contact the [facility] Attending or Consulting provider 7A - 7P or covering  provider during after hours 7P -7A, for this patient?  Check the care team in [facility] and look for a) attending/consulting TR[facility] provider listed and b) the TR[facility] team listed. Page or secure chat 7A-7P. Log into [email or web address] and use [place]'s universal password to access. If you do not have the password, please contact the hospital operator. Locate the [facility] provider you are looking for under [facility] and page to a number that you can be directly reached. If you still have difficulty reaching the provider, please page the [facility] (Director on Call) for the Hospitalists listed on amion for assistance.

## 2023-05-17 NOTE — Telephone Encounter (Signed)
 Patient Product/process development scientist completed.    The patient is insured through [facility]. Patient has Medicare and is not eligible for a copay card, but may be able to apply for patient assistance or Medicare RX Payment Plan (Patient Must reach out to their plan, if eligible for payment plan), if available.    Ran test claim for Eliquis 5 mg and the current 30 day co-pay is $302.00 due to a $255.00 deductible.  Will be $47.00 once deductible is met.   This test claim was processed through [facility]- copay amounts may vary at other pharmacies due to pharmacy/plan contracts, or as the patient moves through the different stages of their insurance plan.     Roland Earl, CPHT Pharmacy Technician III Certified Patient Advocate [facility] Patient Advocate Team Direct Number: [phone]  Fax: [phone]

## 2023-05-17 NOTE — Progress Notes (Deleted)
  Cardiology Office Note:  .   Date:  05/17/2023  ID:  Holly Guerra, DOB 01-09-1941, MRN [id number] PCP: Elvera Ozell [name]  [facility] Providers Cardiologist:  Dorn Lesches, MD  }   History of Present Illness: .   Holly Guerra is a 83 y.o. female with PAD and stenting of the left SFA stent, with ongoing intermittent claudication but well controlled on Pletal .   ROS: ***  Studies Reviewed: .   Echocardiogram 06/03/2022   1. Papillary muscle hypertrophy. Left ventricular ejection fraction, by  estimation, is 45 to 50%. The left ventricle has mildly decreased  function. The left ventricle demonstrates regional wall motion  abnormalities (see scoring diagram/findings for  description). There is severe concentric left ventricular hypertrophy of  the inferior segment. Left ventricular diastolic parameters are consistent  with Grade II diastolic dysfunction (pseudonormalization). Basal function  is preserved.   2. Right ventricular systolic function is normal. The right ventricular  size is normal. Tricuspid regurgitation signal is inadequate for assessing  PA pressure.   3. Left atrial size was severely dilated.   4. Mitral valve leaflet thickening and doming without clear  calcifications. The mitral valve is abnormal. Mild mitral valve  regurgitation.   5. The aortic valve is calcified. Aortic valve regurgitation is mild to  moderate. Moderate aortic valve stenosis. Aortic regurgitation PHT  measures 306 msec. Aortic valve area, by VTI measures 1.02 cm. Aortic  valve Vmax measures 3.02 m/s.   6. There is mild dilatation of the ascending aorta, measuring 39 mm. Mild  when indexed to age, gender, and BSA.   7. The inferior vena cava is normal in size with greater than 50%  respiratory variability, suggesting right atrial pressure of 3 mmHg.     *** EKG Interpretation Date/Time:    Ventricular Rate:    PR Interval:    QRS Duration:    QT Interval:    QTC Calculation:    R Axis:      Text Interpretation:      Physical Exam:   VS:  There were no vitals taken for this visit.   Wt Readings from Last 3 Encounters:  05/17/23 89 lb 1.1 oz (40.4 kg)  11/23/22 100 lb 9.6 oz (45.6 kg)  11/15/22 100 lb (45.4 kg)    GEN: Well nourished, well developed in no acute distress NECK: No JVD; No carotid bruits CARDIAC: ***RRR, no murmurs, rubs, gallops RESPIRATORY:  Clear to auscultation without rales, wheezing or rhonchi  ABDOMEN: Soft, non-tender, non-distended EXTREMITIES:  No edema; No deformity   ASSESSMENT AND PLAN: .   ***    {Are you ordering a CV Procedure (e.g. stress test, cath, DCCV, TEE, etc)?   Press F2        :[id number]}    Signed, Lamarr HERO. Jerilynn CHOL, ANP, AACC

## 2023-05-18 ENCOUNTER — Encounter (HOSPITAL_COMMUNITY): Payer: Self-pay | Admitting: Gastroenterology

## 2023-05-18 DIAGNOSIS — Z515 Encounter for palliative care: Secondary | ICD-10-CM | POA: Diagnosis not present

## 2023-05-18 DIAGNOSIS — F17213 Nicotine dependence, cigarettes, with withdrawal: Secondary | ICD-10-CM | POA: Diagnosis not present

## 2023-05-18 DIAGNOSIS — I5041 Acute combined systolic (congestive) and diastolic (congestive) heart failure: Secondary | ICD-10-CM | POA: Diagnosis not present

## 2023-05-18 DIAGNOSIS — F419 Anxiety disorder, unspecified: Secondary | ICD-10-CM | POA: Diagnosis not present

## 2023-05-18 DIAGNOSIS — Z7189 Other specified counseling: Secondary | ICD-10-CM | POA: Diagnosis not present

## 2023-05-18 LAB — CBC
HCT: 26.7 % — ABNORMAL LOW (ref 36.0–46.0)
Hemoglobin: 8.3 g/dL — ABNORMAL LOW (ref 12.0–15.0)
MCH: 21 pg — ABNORMAL LOW (ref 26.0–34.0)
MCHC: 31.1 g/dL (ref 30.0–36.0)
MCV: 67.4 fL — ABNORMAL LOW (ref 80.0–100.0)
Platelets: 252 10*3/uL (ref 150–400)
RBC: 3.96 MIL/uL (ref 3.87–5.11)
RDW: 19.6 % — ABNORMAL HIGH (ref 11.5–15.5)
WBC: 5.4 10*3/uL (ref 4.0–10.5)
nRBC: 0 % (ref 0.0–0.2)

## 2023-05-18 NOTE — TOC Progression Note (Addendum)
 Transition of Care [facility]) - Progression Note    Patient Details  Name: Holly Guerra MRN: [id number] Date of Birth: 20-Aug-1940  [facility] CM/SW Contact  Isaiah Public, LCSWA Phone Number: 05/18/2023, 12:52 PM  Clinical Narrative:     Patients insurance authorization currently pending for SNF. CSW will continue to follow.  Update- CSW called patients insurance. Patients insurance informed CSW that patients insurance authorization is under wellsite geologist for review. CSW will continue to follow.  Expected Discharge Plan:  (Home health vs SNF) Barriers to Discharge: SNF Pending bed offer  Expected Discharge Plan and Services       Living arrangements for the past 2 months: Single Family Home                                       Social Determinants of Health (SDOH) Interventions SDOH Screenings   Food Insecurity: No Food Insecurity (05/04/2023)  Housing: Low Risk  (05/06/2023)  Transportation Needs: No Transportation Needs (05/06/2023)  Utilities: Not At Risk (05/04/2023)  Alcohol Screen: Low Risk  (05/06/2023)  Financial Resource Strain: Low Risk  (05/06/2023)  Tobacco Use: High Risk (05/15/2023)    Readmission Risk Interventions     No data to display

## 2023-05-18 NOTE — Progress Notes (Signed)
 Nutrition Follow-up  DOCUMENTATION CODES:   Severe malnutrition in context of chronic illness  INTERVENTION:  -Continue regular diet with snacks BID, assist with tray set up as needed.  -Continue Ensure Enlive po TID, each supplement provides 350 kcal and 20 grams of protein.  -MVI/Minerals-1 Tab daily, thiamine  100 mg daily for micronutrient support with inadequate intakes.    NUTRITION DIAGNOSIS:   Severe Malnutrition related to chronic illness as evidenced by severe muscle depletion, severe fat depletion.  -ongoing  GOAL:   Patient will meet greater than or equal to 90% of their needs  -progressing  MONITOR:   PO intake, Weight trends, Supplement acceptance, Labs, I & O's  REASON FOR ASSESSMENT:   Follow up  ASSESSMENT:   83 y.o Female with PMH of HTN, aortic valvular disease  pernicious anemia, GERD, arthritis, PVD, tobacco use, CAD. Presented with shortness of breath and weakness. Found to have acute heart failure in the setting of influenza A infection  Intakes recorded average 42% x 8 meals. States she is taking the snacks and drinking the Ensure and would like to continue at home. Provided coupons for Ensure. Current weight trended to admit weight.  Medications reviewed and include colace, ferrous sulfate  325 mg daily, MVI/Minerals-1 Tab daily, PPI, carafate .  Labs reviewed     Diet Order:   Diet Order             Diet regular Fluid consistency: Thin  Diet effective now                   EDUCATION NEEDS:   Not appropriate for education at this time  Skin:  Skin Assessment: Reviewed RN Assessment  Last BM:  05/18/2023  Height:   Ht Readings from Last 1 Encounters:  05/08/23 5' 2 (1.575 m)    Weight:   Wt Readings from Last 1 Encounters:  05/18/23 40.8 kg    Ideal Body Weight:  50 kg  BMI:  Body mass index is 16.45 kg/m.  Estimated Nutritional Needs:   Kcal:  1400-1600 kcal  Protein:  70-90 gm  Fluid:  >1.4L or per  MD    Elveria Sable, RDLD Clinical Dietitian If unable to reach, please contact RD Inpatient secure chat group between 8 am-4 pm daily

## 2023-05-18 NOTE — Care Management Important Message (Signed)
 Important Message  Patient Details  Name: Holly Guerra MRN: [id number] Date of Birth: 1941/02/09   Important Message Given:  Yes - Medicare IM     Renie Ora 05/18/2023, 11:31 AM

## 2023-05-18 NOTE — Progress Notes (Signed)
   Palliative Medicine Inpatient Follow Up Note HPI: [name] with history of aortic insufficiency, peripheral vascular disease, GERD, essential hypertension, pernicious anemia presented to the ED yesterday with multiple symptoms, progressive shortness of breath X 1 week, some swelling, cough and orthopnea, in addition also complained of congestion sinus pain and headaches.    The Palliative care team has been asked to get involved to support additional goals of care conversations, code status, and discuss patients ongoing adult failure to thrive.   Today's Discussion 05/18/2023  *Please note that this is a verbal dictation therefore any spelling or grammatical errors are due to the Dragon Medical One system interpretation.  Chart reviewed inclusive of vital signs, progress notes, laboratory results, and diagnostic images.   I met with Holly Guerra's RN, Ireion who feels that Holly Guerra is much improved since the nicotine  patch and buspar . She shares that she has no active concerns at this time.   I met with Holly Guerra who is in great spirits. She endorses that she is eating better, she denies pain, nausea, or shortness of breath. Discussed plan for SNF placement, though she was not thrilled with this idea.  I called and spoke to patients daughter, Holly Guerra this morning. We reviewed the improvements her mother has made since being here. She feels that her mother knowing she is discharging soon has given her a boost.   Questions and concerns addressed/Palliative Support Provided.   Objective Assessment: Vital Signs Vitals:   05/18/23 0431 05/18/23 0718  BP: 122/64 (!) 119/97  Pulse: 97 91  Resp: (!) 23 20  Temp: 98 F (36.7 C) 98.1 F (36.7 C)  SpO2: 92% 97%    Intake/Output Summary (Last 24 hours) at 05/18/2023 9175 Last data filed at 05/17/2023 2033 Gross per 24 hour  Intake 956 ml  Output 500 ml  Net 456 ml   Last Weight  Most recent update: 05/18/2023  4:33 AM    Weight  40.8 kg (89 lb 15.2 oz)             Gen:  Elderly AA F chronically ill appearing HEENT: moist mucous membranes CV: Regular rate and irregular rhythm  PULM:  On RA, breathing is even and nonlabored ABD: soft/nontender  EXT: No edema  Neuro: Alert and oriented to person and place  SUMMARY OF RECOMMENDATIONS   DNAR/DNI  Initiate delirium precautions --> Continue low dose buspar  5mg  PO TID; Continue nicotine  patch to 21mg  Q16H  Plan for transition to SNF once medically optimized, with Palliative support through [facility]  Appreciate TOC helping family with long term care options --> Awaiting placement with insurance authorization pending   Ongoing incremental PMT support  Time: 35 ______________________________________________________________________________________ Holly Guerra [place] Palliative Medicine Team Team Cell Phone: [phone] Please utilize secure chat with additional questions, if there is no response within 30 minutes please call the above phone number  Palliative Medicine Team providers are available by phone from 7am to 7pm daily and can be reached through the team cell phone.  Should this patient require assistance outside of these hours, please call the patient's attending physician.

## 2023-05-18 NOTE — Progress Notes (Signed)
 PROGRESS NOTE    Holly Guerra  FMW:[id number] DOB: 1940/12/09 DOA: 05/03/2023 PCP: Elvera Ozell SAUNDERS, PA-C   Brief Narrative:  Holly Guerra was admitted to the hospital with the working diagnosis of acute heart failure in the setting of influenza A infection.    [name] with history of aortic insufficiency, peripheral vascular disease, GERD, essential hypertension, pernicious anemia presented to the ED yesterday with multiple symptoms, progressive shortness of breath X 1 week, some swelling, cough and orthopnea, in addition also complained of congestion sinus pain and headaches. -In the ED she was noted to be mildly hypoxic, BNP 4119, respiratory virus panel positive for influenza A, hemoglobin 9.2, creatinine 1.03,    Chest radiograph with cardiomegaly with bilateral hilar vascular congestion and cephalization of the vasculature.    EKG 89 bpm, left axis deviation, left anterior fascicular block, qtc 508, sinus rhythm with poor R R wave progression, no significant ST segment changes and negative T wave V5 and V6.   Assessment & Plan:   Principal Problem:   Acute heart failure (HCC) Active Problems:   Influenza with respiratory manifestation   Essential hypertension   AKI (acute kidney injury) (HCC)   Pernicious anemia   GERD (gastroesophageal reflux disease)   PVD (peripheral vascular disease) (HCC)   Protein-calorie malnutrition, severe   Dysphagia   Abnormal barium swallow   Antiplatelet or antithrombotic long-term use   Loss of weight   Esophageal stricture  Failure to Thrive / Weight Loss  Poor PO Intake / Odynophagia/esophageal stricture/ Glossitis  Concern for possible oropharyngeal candidiasis Apparently she has greyish/brown coating on tongue as well as ulcerations with grey slough.  Not classic appearance for thrush, but treating empirically for oropharyngeal and esophageal candidiasis with itraconazole  with her pain/discomfort (no fluconazole  due to plavix ). Narrowing in  proximal esophagus, irregular and thickened esophageal mucosa concerning for esophagitis, mild to moderate segmental stricture of the distal esophagus. GI consulted, Plavix  stopped, had 5 days washout, underwent EGD by Dr. Rollin 05/15/2023 with esophageal dilatation which appeared not benign per GI notes.  They recommended resuming heparin  and Plavix  05/17/2023.  Hemoglobin stable. B12 wnl. CT abdomen/pelvis without acute abnormality (limited eval due to motion and streak artifact) RD previously recommended cortrak, daughter [name] wasn't interested with concern she may pull this.  Will avoid for now.  Will see how she does with above measures. Also, seen by Dr. Roark of ENT for stomatitis on 05/12/2023, per him, it was unclear of the etiology of stomatitis but systemic issues likely stress and vitamin deficiency may be the cause.  He recommended trying antifungal first, patient already is on itraconazole  however I added topical Kenalog  per his recommendations.  Patient's tongue pain and lesions are improving.   New onset Atrial Fibrillation with RVR Back in sinus, on oral metoprolol .  CHA2DS2-VASc score 6, due to high risk of thromboembolism, will start on anticoagulation and start on Eliquis  today as she has been cleared by GI.  Acute Metabolic Encephalopathy Dysarthria  Per discussion with daughter, waxing/waning encephalopathy, dysarthria for the past week. Likely due to influenza infection. head CT -> progressive atrophy and white matter disease, advanced for age High dose thiamine .  Confusion this morning noted by nursing staff.  I think she likely has underlying dementia when she is having intermittent hospital-acquired delirium.  Currently she is alert and oriented to self only.  Indeterminate Splenic Lesion Consider postcontrast CT vs MR   New onset of congestive heart failure (HCC) Echo with EF 45-50%, regional wall  motion abnormalities, grade II diastolic dysfunction, moderate AS  Spironolactone ,  jardiance  Lasix     Influenza A with pneumonia No signs of bacterial superinfection.  Completed oseltamivir  for antiviral therapy.    Regional Wall Motion Abnormality Plan for outpatient cardiology follow up    Right Foot Pain  Peripheral Vascular Disease Hx L SFA stent  Related to peripheral vascular disease?  Palpable pulse Will monitor for now.  She had no complaints today.   AKI (acute kidney injury) (HCC) ruled out.  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  Last known normal creatinine record was more than 9 months old.   Hypokalemia.  Resolved   Essential hypertension Patient is on metoprolol  and blood pressure is controlled.   Pernicious anemia Cell count has been stable.  Patient on B12 injections q 30 days.    Iron deficiency anemia, continue with oral iron supplementation.    GERD (gastroesophageal reflux disease) Continue with pantoprazole .    PVD (peripheral vascular disease) (HCC) Continue blood pressure control  Patient on aspirin , and Plavix , however Plavix  was on hold for EGD, resuming Plavix  today per GI recommendations.  However now that she is also on Eliquis , will stop aspirin  to reduce risk of GI bleed.  Cilostazol  on hold due to new diagnosis of CHF and will be discontinued at discharge.   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  DVT prophylaxis: apixaban  (ELIQUIS ) tablet 2.5 mg Start: 05/17/23 1000 SCDs Start: 05/04/23 0004 Place TED hose Start: 05/04/23 0004   Code Status: Limited: Do not attempt resuscitation (DNR) -DNR-LIMITED -Do Not  Intubate/DNI   Family Communication:  None present at bedside.   Status is: Inpatient Remains inpatient appropriate because: Patient medically stable since 05/16/2023, pending placement, TOC on board.   Estimated body mass index is 16.45 kg/m as calculated from the following:   Height as of this encounter: 5' 2 (1.575 m).   Weight as of this encounter: 40.8 kg.    Nutritional Assessment: Body mass index is 16.45 kg/m.[name] Seen by dietician.  I agree with the assessment and plan as outlined below: Nutrition Status: Nutrition Problem: Severe Malnutrition Etiology: chronic illness Signs/Symptoms: severe muscle depletion, severe fat depletion Interventions: Refer to RD note for recommendations  . Skin Assessment: I have examined the patient's skin and I agree with the wound assessment as performed by the wound care RN as outlined below:    Consultants:  GI   Procedures:  As above  Antimicrobials:  Anti-infectives (From admission, onward)    Start     Dose/Rate Route Frequency Ordered Stop   05/17/23 1400  fluconazole  (DIFLUCAN ) tablet 100 mg        100 mg Oral Daily 05/17/23 1120 05/23/23 0959   05/09/23 1800  itraconazole  (SPORANOX ) 10 MG/ML solution 200 mg  Status:  Discontinued        200 mg Oral Every 24 hours 05/09/23 1616 05/09/23 1624   05/09/23 1800  itraconazole  (SPORANOX ) capsule 200 mg  Status:  Discontinued        200 mg Oral Every 24 hours 05/09/23 1624 05/17/23 1120   05/04/23 0100  oseltamivir  (TAMIFLU ) capsule 30 mg        30 mg Oral Daily 05/04/23 0004 05/08/23 0835         Subjective: Patient seen and examined.  Sitting at the edge of the bed.  She has no complaints.  Tongue pain has improved.  She was alert and oriented x 2 today.  Objective: Vitals:   05/17/23 2031 05/18/23 0431 05/18/23 0718 05/18/23 1150  BP: 126/62 122/64 (!) 119/97   Pulse: 85 97 91 80  Resp: 20 (!) 23 20 18   Temp: 97.9 F (36.6 C) 98 F (36.7 C) 98.1 F (36.7 C)    TempSrc: Oral Oral Oral Oral  SpO2: 99% 92% 97%   Weight:  40.8 kg    Height:        Intake/Output Summary (Last 24 hours) at 05/18/2023 1343 Last data filed at 05/17/2023 2033 Gross per 24 hour  Intake 236 ml  Output 500 ml  Net -264 ml   Filed Weights   05/16/23 0333 05/17/23 0500 05/18/23 0431  Weight: 40.8 kg 40.4 kg 40.8 kg    Examination:  General exam: Appears calm and comfortable  Respiratory system: Clear to auscultation. Respiratory effort normal. Cardiovascular system: S1 & S2 heard, RRR. No JVD, murmurs, rubs, gallops or clicks. No pedal edema. Gastrointestinal system: Abdomen is nondistended, soft and nontender. No organomegaly or masses felt. Normal bowel sounds heard. Central nervous system: Alert and oriented x 2. No focal neurological deficits. Extremities: Symmetric 5 x 5 power. Skin: No rashes, lesions or ulcers.    Data Reviewed: I have personally reviewed following labs and imaging studies  CBC: Recent Labs  Lab 05/13/23 0422 05/14/23 0537 05/15/23 0338 05/16/23 0358 05/17/23 0318 05/18/23 0459  WBC 5.1 4.4 4.4 5.6 5.3 5.4  NEUTROABS 3.2  --   --   --   --   --   HGB 9.3* 9.1* 8.2* 8.3* 8.0* 8.3*  HCT 30.2* 29.7* 26.3* 26.8* 26.7* 26.7*  MCV 67.6* 67.5* 67.1* 67.3* 67.6* 67.4*  PLT 268 270 250 235 217 252   Basic Metabolic Panel: Recent Labs  Lab 05/13/23 0422 05/14/23 1042  NA 140 139  K 3.8 4.0  CL 104 106  CO2 26 24  GLUCOSE 142* 146*  BUN 8 11  CREATININE 1.07* 0.97  CALCIUM  9.3 9.1  MG 2.0  --   PHOS 2.5  --    GFR: Estimated Creatinine Clearance: 29.3 mL/min (by C-G formula based on SCr of 0.97 mg/dL). Liver Function Tests: Recent Labs  Lab 05/13/23 0422  AST 28  ALT 30  ALKPHOS 55  BILITOT 0.4  PROT 6.7  ALBUMIN 2.8*   No results for input(s): LIPASE, AMYLASE in the last 168 hours. No results for input(s): AMMONIA in the last 168 hours. Coagulation Profile: No results for input(s): INR, PROTIME in the  last 168 hours. Cardiac Enzymes: No results for input(s): CKTOTAL, CKMB, CKMBINDEX, TROPONINI in the last 168 hours. BNP (last 3 results) No results for input(s): PROBNP in the last 8760 hours. HbA1C: No results for input(s): HGBA1C in the last 72 hours. CBG: No results for input(s): GLUCAP in the last 168 hours. Lipid Profile: No results for input(s): CHOL, HDL, LDLCALC, TRIG, CHOLHDL, LDLDIRECT in the last 72 hours. Thyroid  Function Tests: No results for input(s): TSH, T4TOTAL, FREET4, T3FREE, THYROIDAB in the last 72 hours. Anemia Panel: No results for input(s): VITAMINB12, FOLATE, FERRITIN, TIBC, IRON, RETICCTPCT in the last 72 hours.  Sepsis Labs: No results  for input(s): PROCALCITON, LATICACIDVEN in the last 168 hours.  No results found for this or any previous visit (from the past 240 hours).    Radiology Studies: No results found.   Scheduled Meds:  allopurinol   100 mg Oral QPM   apixaban   2.5 mg Oral BID   busPIRone   5 mg Oral TID   clopidogrel   75 mg Oral Daily   cycloSPORINE   1 drop Both Eyes BID   diclofenac  Sodium  2 g Topical QID   docusate sodium   100 mg Oral BID   feeding supplement  237 mL Oral TID BM   ferrous sulfate   325 mg Oral Daily   fluconazole   100 mg Oral Daily   metoprolol  tartrate  12.5 mg Oral BID   multivitamin with minerals  1 tablet Oral Daily   nicotine   21 mg Transdermal Daily   pantoprazole   40 mg Oral BID   sucralfate   1 g Oral Q8H   traZODone   50 mg Oral QHS   triamcinolone    Mouth/Throat TID   Continuous Infusions:     LOS: 14 days   Fredia Skeeter, MD [facility]  05/18/2023, 1:43 PM   *Please note that this is a verbal dictation therefore any spelling or grammatical errors are due to the Dragon Medical One system interpretation.  Please page via Amion and do not message via secure chat for urgent patient care matters. Secure chat can be used for non urgent patient  care matters.  How to contact the [facility] Attending or Consulting provider 7A - 7P or covering provider during after hours 7P -7A, for this patient?  Check the care team in [facility] and look for a) attending/consulting [facility] provider listed and b) the [facility] team listed. Page or secure chat 7A-7P. Log into [email or web address] and use [facility] universal password to access. If you do not have the password, please contact the hospital operator. Locate the [facility] provider you are looking for under [facility] and page to a number that you can be directly reached. If you still have difficulty reaching the provider, please page the [facility] (Director on Call) for the Hospitalists listed on amion for assistance.

## 2023-05-18 NOTE — Plan of Care (Signed)

## 2023-05-18 NOTE — Progress Notes (Signed)
 Occupational Therapy Treatment Patient Details Name: Holly Guerra MRN: [id number] DOB: September 02, 1940 Today's Date: 05/18/2023   History of present illness 83 y.o. female presents to [facility] hospital on 12.23.2024 with SOB. Pt admitted for management of new onset CHF. Pt also found to be positive for flu. PMH includes aortic valve disease, PVD, GERD, anemia, HTN.   OT comments  Pt with good progress toward established OT goals; continues to be limited by her cognition, but is much improved this session able to state she is in the hospital. Nose bleed on arrival with pt with multiple saturated napkins and no attempt to let RN know. Cues for safer management of nose bleed and forward posture while managing. Vitals stable, able to stand at sink with CGA for safety and wash face/brush teeth after managed.       If plan is discharge home, recommend the following:  A little help with walking and/or transfers;A little help with bathing/dressing/bathroom;Assistance with cooking/housework;Assist for transportation;Help with stairs or ramp for entrance;Direct supervision/assist for medications management;Direct supervision/assist for financial management   Equipment Recommendations  Other (comment) (defer)    Recommendations for Other Services      Precautions / Restrictions Precautions Precautions: Fall;Other (comment) Precaution Comments: delirium Restrictions Weight Bearing Restrictions Per Provider Order: No       Mobility Bed Mobility               General bed mobility comments: Sitting EOB on arrival and departure    Transfers Overall transfer level: Needs assistance Equipment used: Rolling walker (2 wheels) Transfers: Sit to/from Stand Sit to Stand: Contact guard assist                 Balance Overall balance assessment: Needs assistance Sitting-balance support: No upper extremity supported, Feet supported Sitting balance-Leahy Scale: Good     Standing balance support:  Bilateral upper extremity supported, Reliant on assistive device for balance, During functional activity Standing balance-Leahy Scale: Poor                             ADL either performed or assessed with clinical judgement   ADL Overall ADL's : Needs assistance/impaired     Grooming: Contact guard assist;Standing;Wash/dry face;Oral care Grooming Details (indicate cue type and reason): CGA for safety             Lower Body Dressing: Contact guard assist;Sitting/lateral leans   Toilet Transfer: Contact guard assist;Minimal assistance           Functional mobility during ADLs: Contact guard assist      Extremity/Trunk Assessment Upper Extremity Assessment Upper Extremity Assessment: Overall WFL for tasks assessed   Lower Extremity Assessment Lower Extremity Assessment: Defer to PT evaluation        Vision       Perception     Praxis      Cognition Arousal: Alert Behavior During Therapy: WFL for tasks assessed/performed Overall Cognitive Status: Impaired/Different from baseline Area of Impairment: Attention, Problem solving, Safety/judgement, Awareness, Memory, Following commands                   Current Attention Level: Selective Memory: Decreased short-term memory Following Commands: Follows one step commands consistently, Follows one step commands with increased time, Follows multi-step commands inconsistently Safety/Judgement: Decreased awareness of safety, Decreased awareness of deficits Awareness: Emergent Problem Solving: Slow processing, Difficulty sequencing, Requires verbal cues General Comments: Pleasant and cooperative; intermittently making statements that don't  coorelate with current situation I was about to open the front door (referencing room door); I would leave because my car is in the lot but I dont know whwere my keys are. Pt with nose bleed on arrival stuffing large quantities of tissue into nose and with no  intentions on letting staff know nose is bleeding.        Exercises      Shoulder Instructions       General Comments VSS on RA. Called RN who came in to document nose bleed    Pertinent Vitals/ Pain       Pain Assessment Pain Assessment: No/denies pain  Home Living                                          Prior Functioning/Environment              Frequency  Min 1X/week        Progress Toward Goals  OT Goals(current goals can now be found in the care plan section)  Progress towards OT goals: Progressing toward goals  Acute Rehab OT Goals Patient Stated Goal: go home OT Goal Formulation: With patient Time For Goal Achievement: 06/01/23 Potential to Achieve Goals: Good  Plan      Co-evaluation                 AM-PAC OT 6 Clicks Daily Activity     Outcome Measure   Help from another person eating meals?: None Help from another person taking care of personal grooming?: A Little Help from another person toileting, which includes using toliet, bedpan, or urinal?: A Lot Help from another person bathing (including washing, rinsing, drying)?: A Little Help from another person to put on and taking off regular upper body clothing?: A Little Help from another person to put on and taking off regular lower body clothing?: A Little 6 Click Score: 18    End of Session Equipment Utilized During Treatment: Gait belt;Rolling walker (2 wheels)  OT Visit Diagnosis: Unsteadiness on feet (R26.81);Muscle weakness (generalized) (M62.81);Other symptoms and signs involving cognitive function   Activity Tolerance Patient tolerated treatment well   Patient Left in bed;with call bell/phone within reach;with bed alarm set   Nurse Communication Mobility status        Time: 8854-8787 OT Time Calculation (min): 27 min  Charges: OT General Charges $OT Visit: 1 Visit OT Treatments $Self Care/Home Management : 23-37 mins  Holly Guerra FREDERICK,  OTR/L [facility] Acute Rehabilitation Office: [phone]   Holly Guerra 05/18/2023, 12:56 PM

## 2023-05-18 NOTE — Progress Notes (Signed)
 Mobility Specialist Progress Note:    05/18/23 1500  Mobility  Activity Ambulated with assistance in hallway  Level of Assistance Contact guard assist, steadying assist  Assistive Device Four wheel walker  Distance Ambulated (ft) 300 ft  Activity Response Tolerated well  Mobility Referral Yes  Mobility visit 1 Mobility  Mobility Specialist Start Time (ACUTE ONLY) 1356  Mobility Specialist Stop Time (ACUTE ONLY) 1410  Mobility Specialist Time Calculation (min) (ACUTE ONLY) 14 min   Pt received on EOB, agreeable to mobility. No complaints throughout. Returned to room w/o fault. Pt left on EOB w/ call bell and all needs met. Bed alarm on.  D'Vante Nicholaus Mobility Specialist Please contact via Special Educational Needs Teacher or Rehab office at [phone]

## 2023-05-18 NOTE — Plan of Care (Signed)
  Problem: Education: Goal: Knowledge of General Education information will improve Description: Including pain rating scale, medication(s)/side effects and non-pharmacologic comfort measures Outcome: Progressing   Problem: Health Behavior/Discharge Planning: Goal: Ability to manage health-related needs will improve Outcome: Progressing   Problem: Clinical Measurements: Goal: Ability to maintain clinical measurements within normal limits will improve Outcome: Progressing Goal: Will remain free from infection Outcome: Progressing Goal: Diagnostic test results will improve Outcome: Progressing Goal: Respiratory complications will improve Outcome: Progressing Goal: Cardiovascular complication will be avoided Outcome: Progressing   Problem: Activity: Goal: Risk for activity intolerance will decrease Outcome: Progressing   Problem: Nutrition: Goal: Adequate nutrition will be maintained Outcome: Progressing   Problem: Coping: Goal: Level of anxiety will decrease Outcome: Progressing   Problem: Elimination: Goal: Will not experience complications related to bowel motility Outcome: Progressing Goal: Will not experience complications related to urinary retention Outcome: Progressing   Problem: Pain Management: Goal: General experience of comfort will improve Outcome: Progressing   Problem: Safety: Goal: Ability to remain free from injury will improve Outcome: Progressing   Problem: Skin Integrity: Goal: Risk for impaired skin integrity will decrease Outcome: Progressing   Problem: Education: Goal: Ability to demonstrate management of disease process will improve Outcome: Progressing Goal: Ability to verbalize understanding of medication therapies will improve Outcome: Progressing Goal: Individualized Educational Video(s) Outcome: Progressing   Problem: Activity: Goal: Capacity to carry out activities will improve Outcome: Progressing   Problem: Cardiac: Goal:  Ability to achieve and maintain adequate cardiopulmonary perfusion will improve Outcome: Progressing   Problem: Education: Goal: Ability to demonstrate management of disease process will improve Outcome: Progressing Goal: Ability to verbalize understanding of medication therapies will improve Outcome: Progressing Goal: Individualized Educational Video(s) Outcome: Progressing   Problem: Activity: Goal: Capacity to carry out activities will improve Outcome: Progressing   Problem: Cardiac: Goal: Ability to achieve and maintain adequate cardiopulmonary perfusion will improve Outcome: Progressing

## 2023-05-19 DIAGNOSIS — Z515 Encounter for palliative care: Secondary | ICD-10-CM | POA: Diagnosis not present

## 2023-05-19 DIAGNOSIS — I5041 Acute combined systolic (congestive) and diastolic (congestive) heart failure: Secondary | ICD-10-CM | POA: Diagnosis not present

## 2023-05-19 DIAGNOSIS — Z7189 Other specified counseling: Secondary | ICD-10-CM | POA: Diagnosis not present

## 2023-05-19 LAB — BASIC METABOLIC PANEL WITH GFR
Anion gap: 8 (ref 5–15)
BUN: 16 mg/dL (ref 8–23)
CO2: 27 mmol/L (ref 22–32)
Calcium: 9.6 mg/dL (ref 8.9–10.3)
Chloride: 102 mmol/L (ref 98–111)
Creatinine, Ser: 0.85 mg/dL (ref 0.44–1.00)
GFR, Estimated: >60 mL/min
Glucose, Bld: 142 mg/dL — ABNORMAL HIGH (ref 70–99)
Potassium: 4.3 mmol/L (ref 3.5–5.1)
Sodium: 137 mmol/L (ref 135–145)

## 2023-05-19 IMAGING — DX DG CHEST 1V
1 series · 1 of 1 positions shown · non-contrast
Comparison: December 03, 2007.

CLINICAL DATA: Chest pain.

EXAM:
CHEST  1 VIEW

[chest ap]
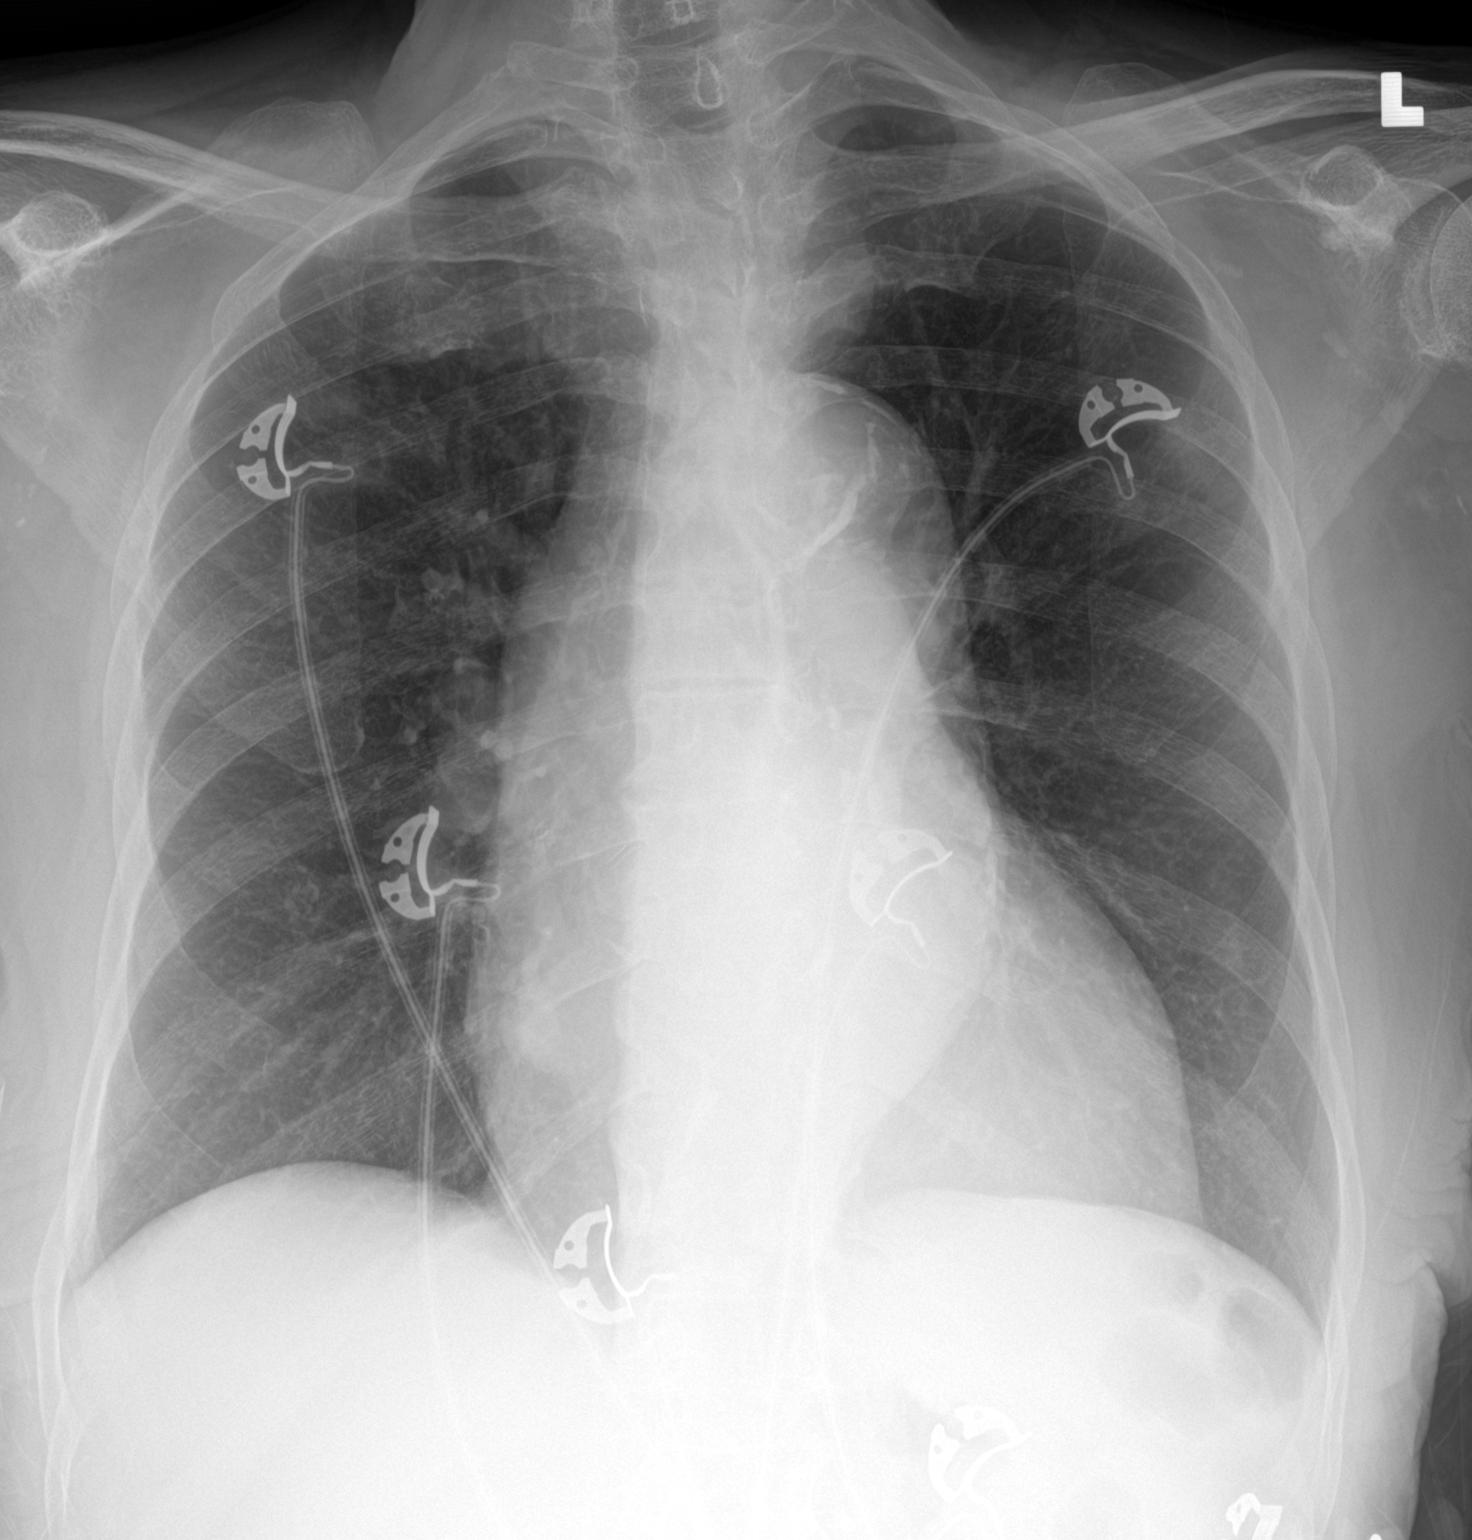

[1 of 1 positions shown; findings below may reference images not displayed]

FINDINGS: Stable cardiomegaly. Both lungs are clear. The visualized skeletal
structures are unremarkable.
IMPRESSION: No active disease.

Aortic Atherosclerosis ([id number]).

## 2023-05-19 IMAGING — CT CT ABD-PELV W/ CM
2 of 5 series · 15 of 46 positions shown, 17 images · IV contrast (Omnipaque)
Comparison: None.

CLINICAL DATA: Abdominal pain, diarrhea, left breast and arm pain.

EXAM:
CT ABDOMEN AND PELVIS WITH CONTRAST
TECHNIQUE: Multidetector CT imaging of the abdomen and pelvis was performed
using the standard protocol following bolus administration of
intravenous contrast.

[Series 2: axial st · axial · 0.72mm/px · z∈[-421,-71]mm · 12 of 80 slices shown, 14 images]
[im 5/80  soft-tissue]
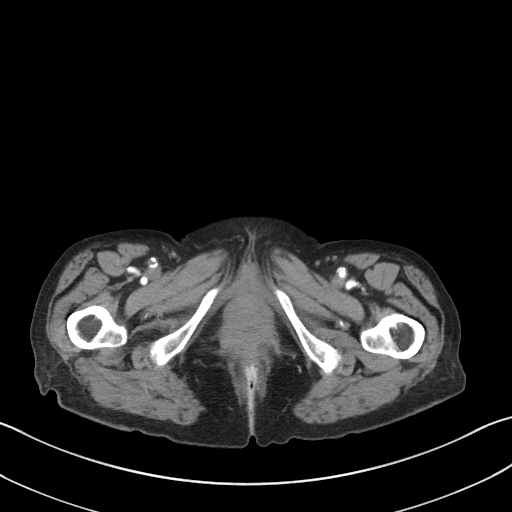
[im 5/80  bone]
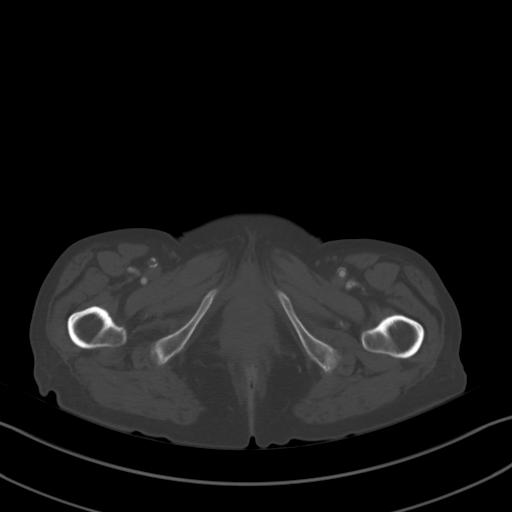
[im 13/80  soft-tissue]
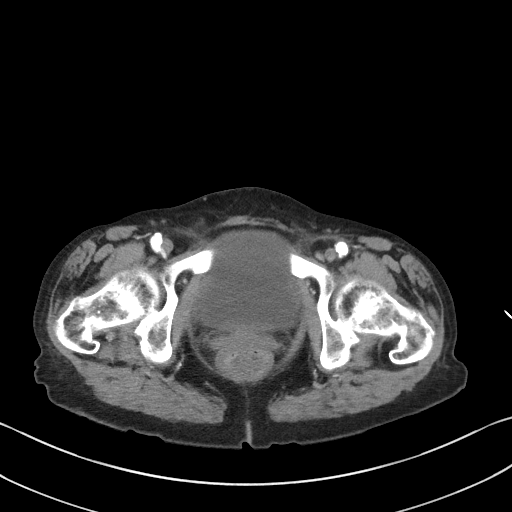
[im 17/80  soft-tissue]
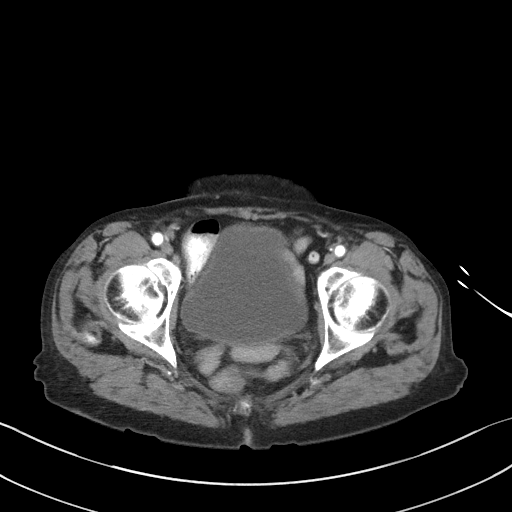
[im 25/80  soft-tissue]
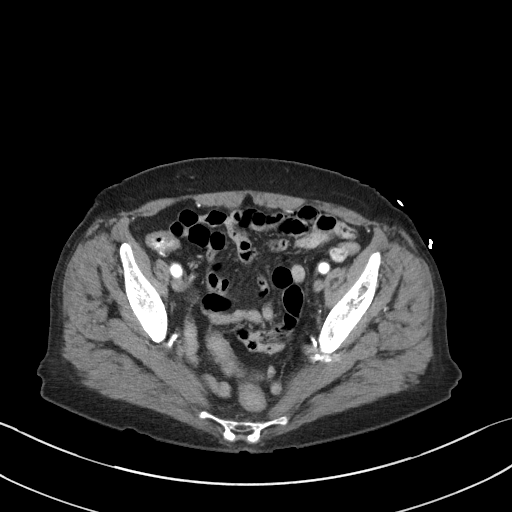
[im 30/80  soft-tissue]
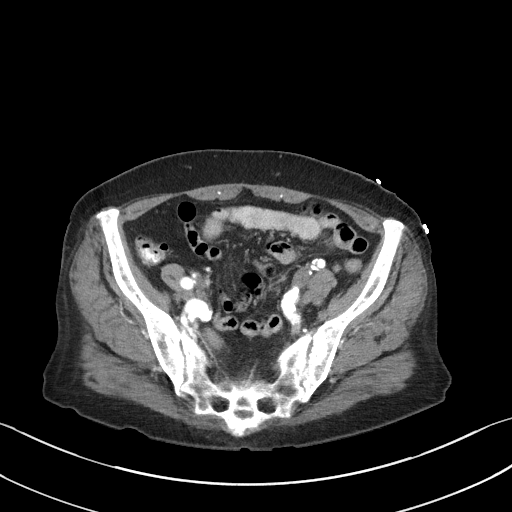
[im 38/80  soft-tissue]
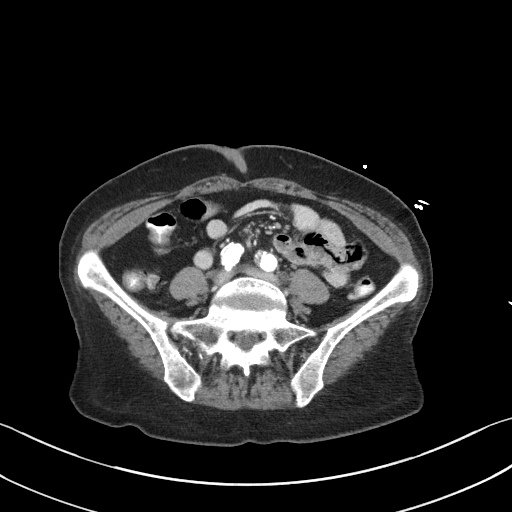
[im 42/80  soft-tissue]
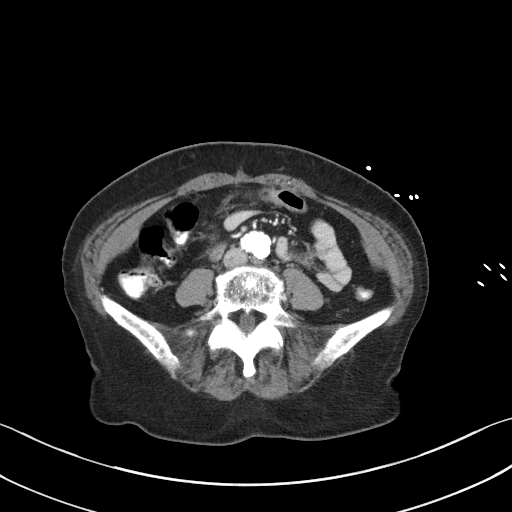
[im 50/80  soft-tissue]
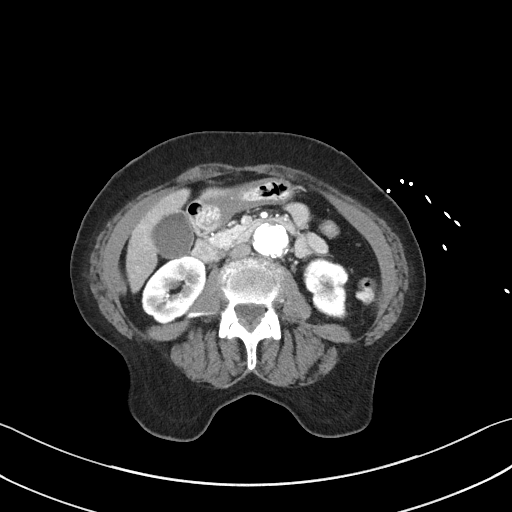
[im 55/80  soft-tissue]
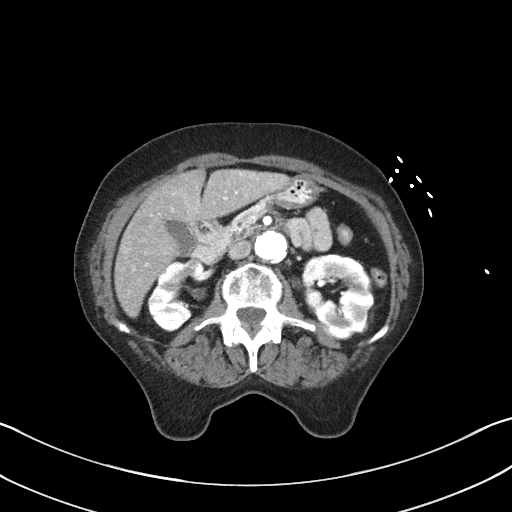
[im 55/80  bone]
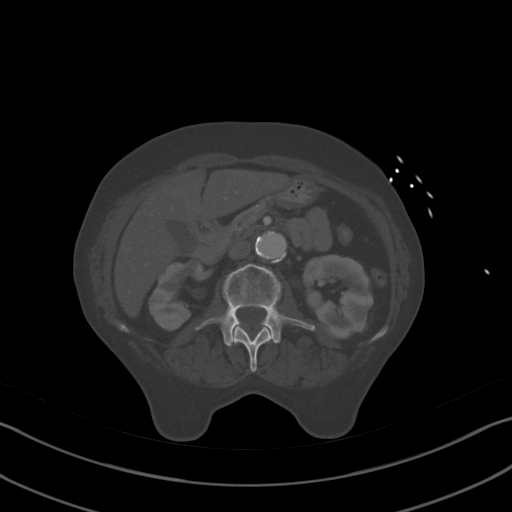
[im 63/80  soft-tissue]
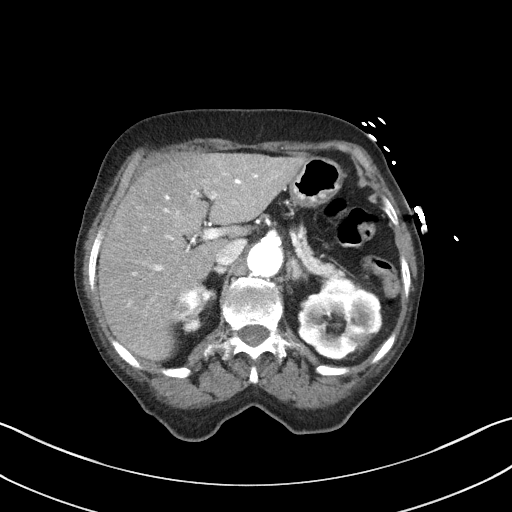
[im 67/80  soft-tissue]
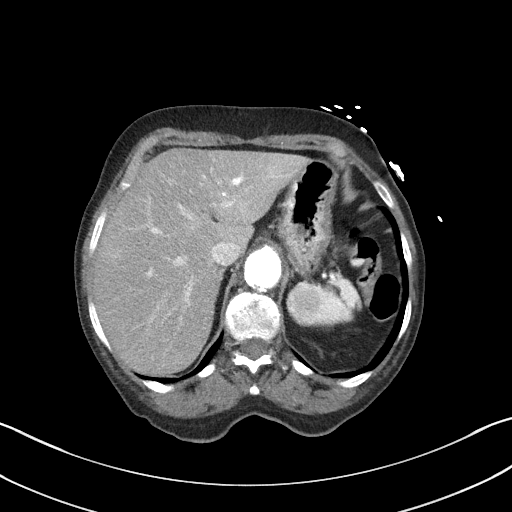
[im 75/80  soft-tissue]
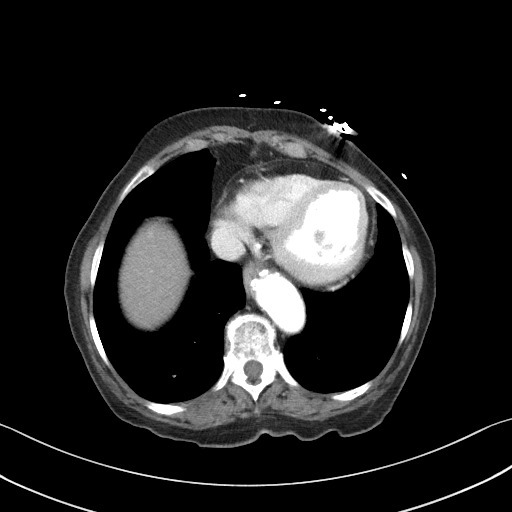

[Series 5: coronal st · coronal · 0.61mm/px · 3 of 83 slices shown]
[im 28/83  soft-tissue]
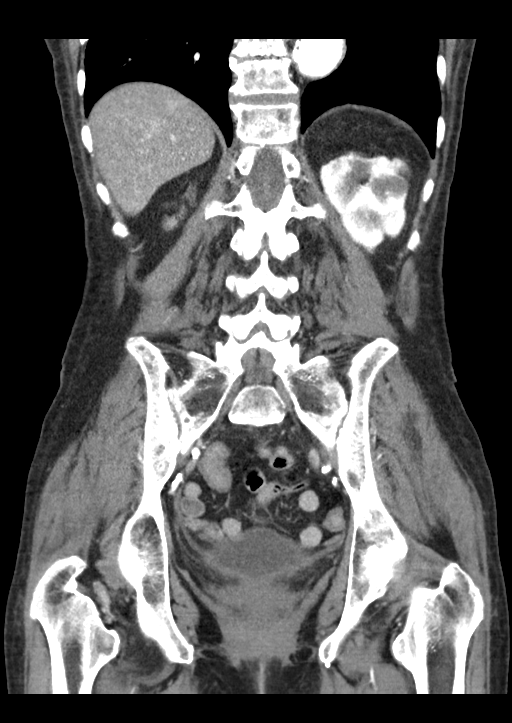
[im 37/83  soft-tissue]
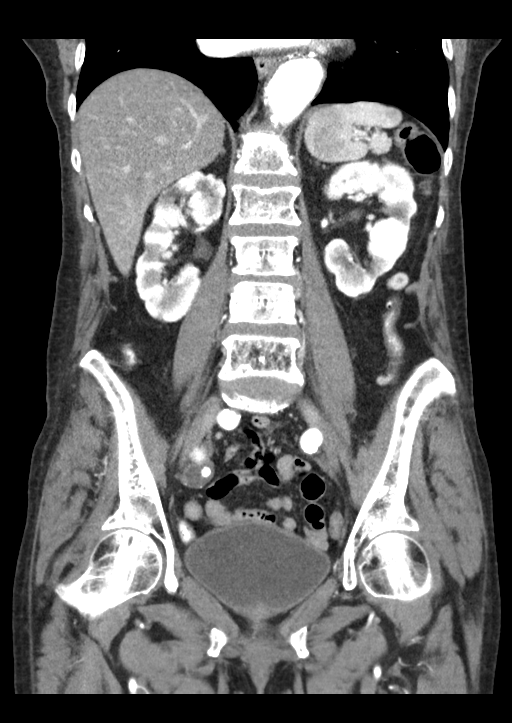
[im 46/83  soft-tissue]
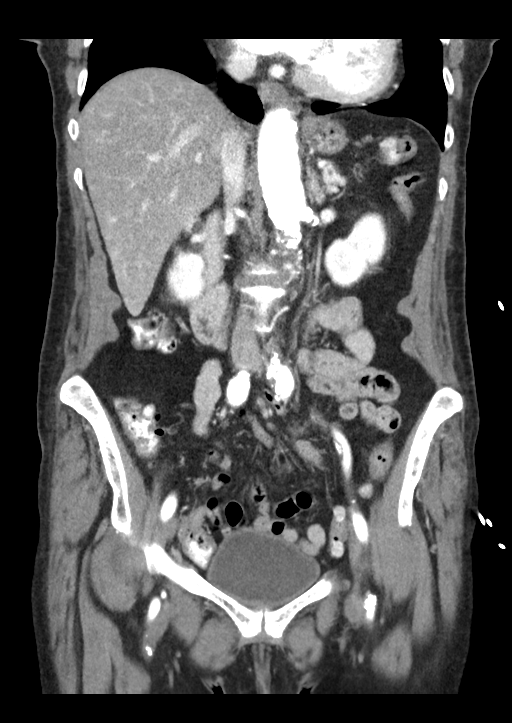

[15 of 46 positions shown; findings below may reference images not displayed]

RADIATION DOSE REDUCTION: This exam was performed according to the
departmental dose-optimization program which includes automated
exposure control, adjustment of the mA and/or kV according to
patient size and/or use of iterative reconstruction technique.

CONTRAST:  100mL OMNIPAQUE IOHEXOL 300 MG/ML  SOLN
FINDINGS: Lower chest: Lung bases are clear. Heart is enlarged. No pericardial
or pleural effusion.

Hepatobiliary: Liver is decreased in attenuation diffusely.
Subcentimeter low-attenuation lesion in the left hepatic lobe, too
small to characterize. Liver and gallbladder are otherwise
unremarkable. No biliary ductal dilatation.

Pancreas: Negative.

Spleen: Heterogeneous medial splenic mass measures 2.5 x 3.1 cm.

Adrenals/Urinary Tract: Adrenal glands are unremarkable. Scarring in
the kidneys bilaterally. Low-attenuation lesions in the kidneys
measure up to 1.5 cm on the right and are likely cysts. No specific
follow-up necessary. Kidneys are otherwise unremarkable. Ureters are
decompressed. Bladder is grossly unremarkable.

Stomach/Bowel: Stomach, small bowel, appendix and colon are
unremarkable. There may be rectal wall thickening.

Vascular/Lymphatic: Atherosclerotic calcification of the aorta. No
pathologically enlarged lymph nodes.

Reproductive: Hysterectomy.  No adnexal mass.

Other: No free fluid.  Mesenteries and peritoneum are unremarkable.

Musculoskeletal: Degenerative changes in the spine. No worrisome
lytic or sclerotic lesions.
IMPRESSION: 1. No acute findings.
2. Possible rectal wall thickening. Difficult to exclude malignancy.
3. Hepatic steatosis.
4. Heterogeneous medial splenic mass, indeterminate. If further
evaluation is desired, MR abdomen without and with contrast is
recommended.
5. Aortic atherosclerosis ([id number]).

## 2023-05-19 MED ORDER — BUSPIRONE HCL 5 MG PO TABS: 5.0000 mg | ORAL_TABLET | Freq: Three times a day (TID) | ORAL | 0 refills | Status: DC

## 2023-05-19 MED ORDER — APIXABAN 2.5 MG PO TABS: 2.5000 mg | ORAL_TABLET | Freq: Two times a day (BID) | ORAL | 0 refills | Status: DC

## 2023-05-19 MED ORDER — ATORVASTATIN CALCIUM 40 MG PO TABS: 40.0000 mg | ORAL_TABLET | Freq: Every day | ORAL | 0 refills | Status: DC

## 2023-05-19 MED ORDER — METOPROLOL TARTRATE 25 MG PO TABS: 12.5000 mg | ORAL_TABLET | Freq: Two times a day (BID) | ORAL | 0 refills | Status: DC

## 2023-05-19 MED ORDER — FLUCONAZOLE 100 MG PO TABS: 100.0000 mg | ORAL_TABLET | Freq: Every day | ORAL | 0 refills | Status: DC

## 2023-05-19 MED ORDER — TRIAMCINOLONE ACETONIDE 0.1 % MT PSTE: PASTE | Freq: Three times a day (TID) | OROMUCOSAL | 0 refills | Status: DC

## 2023-05-19 MED ORDER — SUCRALFATE 1 GM/10ML PO SUSP: 1.0000 g | Freq: Three times a day (TID) | ORAL | 0 refills | Status: DC

## 2023-05-19 MED ORDER — ALLOPURINOL 100 MG PO TABS: 100.0000 mg | ORAL_TABLET | Freq: Every evening | ORAL | 0 refills | Status: AC

## 2023-05-19 NOTE — Progress Notes (Signed)
 Report to Clydie Braun, LPN [facility]. Expecting patient to leave within the hour

## 2023-05-19 NOTE — Discharge Summary (Signed)
 Physician Discharge Summary  [facility] FMW:[id number] DOB: 1940-08-10 DOA: 05/03/2023  PCP: Elvera Ozell SAUNDERS, PA-C  Admit date: 05/03/2023 Discharge date: 05/19/2023 30 Day Unplanned Readmission Risk Score    Flowsheet Row ED to Hosp-Admission (Current) from 05/03/2023 in [place] [facility]  30 Day Unplanned Readmission Risk Score (%) 19.29 Filed at 05/19/2023 1200       This score is the patient's risk of an unplanned readmission within 30 days of being discharged (0 -100%). The score is based on dignosis, age, lab data, medications, orders, and past utilization.   Low:  0-14.9   Medium: 15-21.9   High: 22-29.9   Extreme: 30 and above          Admitted From: Home Disposition: SNF  Recommendations for Outpatient Follow-up:  Follow up with PCP in 1-2 weeks Please obtain BMP/CBC in one week Follow-up with cardiology January 9. Follow-up with vascular surgery in 2 to 4 weeks Please follow up with your PCP on the following pending results: Unresulted Labs (From admission, onward)     Start     Ordered   05/09/23 0500  CBC  Daily,   R      05/08/23 0810   Unscheduled  Occult blood card to lab, stool  As needed,   R      05/08/23 0708              Home Health: None Equipment/Devices: None  Discharge Condition: Stable CODE STATUS: DNR Diet recommendation: Cardiac  Subjective: Seen and examined.  No complaints.  Alert and oriented x 2 and sometimes 3 if prompted.  Brief/Interim Summary:  83/F with history of aortic insufficiency, peripheral vascular disease, GERD, essential hypertension, pernicious anemia presented to the ED yesterday with multiple symptoms, progressive shortness of breath X 1 week, some swelling, cough and orthopnea, in addition also complained of congestion sinus pain and headaches. -In the ED she was noted to be mildly hypoxic, BNP 4119, respiratory virus panel positive for influenza A, hemoglobin 9.2, creatinine 1.03. Chest radiograph  with cardiomegaly with bilateral hilar vascular congestion and cephalization of the vasculature.  Details of hospitalization as below.  Failure to Thrive / Weight Loss  Poor PO Intake / Odynophagia/esophageal stricture/ Glossitis  Concern for possible oropharyngeal candidiasis Apparently she had greyish/brown coating on tongue as well as ulcerations with grey slough.  Not classic appearance for thrush, but treating empirically for oropharyngeal and esophageal candidiasis with itraconazole  with her pain/discomfort (no fluconazole  due to plavix ).  Was found to have narrowing in proximal esophagus, irregular and thickened esophageal mucosa concerning for esophagitis, mild to moderate segmental stricture of the distal esophagus. GI consulted, Plavix  stopped, had 5 days washout, underwent EGD by Dr. Rollin 05/15/2023 with esophageal dilatation which appeared benign per GI notes.  They recommended resuming heparin  and Plavix  05/17/2023.  Hemoglobin stable. B12 wnl. CT abdomen/pelvis without acute abnormality (limited eval due to motion and streak artifact) RD previously recommended cortrak, daughter [name] wasn't interested with concern she may pull this. Also, seen by Dr. Roark of ENT for stomatitis on 05/12/2023, per him, it was unclear of the etiology of stomatitis but systemic issues likely stress and vitamin deficiency may be the cause.  He recommended trying antifungal first, patient already is on itraconazole  however I added topical Kenalog  per his recommendations.  Patient's tongue pain and lesions are improving.  She is being discharged on 4 more days of oral and 5 days of topical antifungal.   New onset Atrial  Fibrillation with RVR Back in sinus, on oral metoprolol .  CHA2DS2-VASc score 6, due to high risk of thromboembolism, started on anticoagulation on 05/17/2023, hemoglobin remained stable.  Since she is on Eliquis  now, aspirin  was discontinued to prevent risk of bleeding.  She is already on Plavix  as  well.  Acute Metabolic Encephalopathy Dysarthria  Per discussion with daughter, waxing/waning encephalopathy, dysarthria for the past week. Likely due to influenza infection. head CT -> progressive atrophy and white matter disease, advanced for age High dose thiamine  that she received here.  Indeterminate Splenic Lesion Consider postcontrast CT vs MR as outpatient however patient remained asymptomatic for last several days.  So there was no indication.   New onset of congestive heart failure (HCC) Echo with EF 45-50%, regional wall motion abnormalities, grade II diastolic dysfunction, moderate AS .  Initially received IV diuretics but has not been on any diuretics for several days and she has been euvolemic.  She is at high risk of dehydration due to poor p.o. intake.  Influenza A with pneumonia No signs of bacterial superinfection.  Completed oseltamivir  for antiviral therapy.    Regional Wall Motion Abnormality Plan for outpatient cardiology follow up    Right Foot Pain  Peripheral Vascular Disease Hx L SFA stent  Related to peripheral vascular disease?  Palpable pulse Will monitor for now.  She had no complaints today.  We had to discontinue her cilostazol  due to new onset of CHF.  Continue Plavix .   AKI (acute kidney injury) (HCC) ruled out.  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  Last known normal creatinine record was more than 57 months old.   Hypokalemia.  Resolved   Essential hypertension Patient is on metoprolol  and blood pressure is controlled.   Pernicious anemia Cell count has been stable.  Patient on B12 injections q 30 days.    Iron deficiency anemia, continue with oral iron supplementation.    GERD (gastroesophageal reflux disease) Continue with pantoprazole .    PVD (peripheral vascular disease) (HCC) Continue blood pressure control  Patient on aspirin , and  Plavix , however Plavix  was on hold for EGD, resuming Plavix  today per GI recommendations.  However now that she is also on Eliquis , will stop aspirin  to reduce risk of GI bleed.  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   Discharge Diagnoses:  Principal Problem:   Acute heart failure (HCC) Active Problems:   Influenza with respiratory manifestation   Essential hypertension   AKI (acute kidney injury) (HCC)   Pernicious anemia   GERD (gastroesophageal reflux disease)   PVD (peripheral vascular disease) (HCC)   Protein-calorie malnutrition, severe   Dysphagia   Abnormal barium swallow   Antiplatelet or antithrombotic long-term use   Loss of weight   Esophageal stricture    Discharge Instructions   Allergies as of 05/19/2023       Reactions   Lactose Intolerance (gi) Diarrhea        Medication List     STOP taking these medications    aspirin  EC 81 MG tablet   bismuth subsalicylate 262 MG/15ML suspension Commonly known as: PEPTO BISMOL   cilostazol  100 MG tablet Commonly known as: PLETAL    naloxone 4 MG/0.1ML Liqd nasal spray kit Commonly known as: NARCAN  sodium chloride  0.65 % Soln nasal spray Commonly known as: OCEAN   Xarelto 2.5 MG Tabs tablet Generic drug: rivaroxaban       TAKE these medications    acetaminophen  500 MG tablet Commonly known as: TYLENOL  Take 1,000 mg by mouth every 6 (six) hours as needed for mild pain (pain score 1-3) or moderate pain (pain score 4-6).   allopurinol  100 MG tablet Commonly known as: ZYLOPRIM  Take 1 tablet (100 mg total) by mouth every evening.   apixaban  2.5 MG Tabs tablet Commonly known as: ELIQUIS  Take 1 tablet (2.5 mg total) by mouth 2 (two) times daily.   atorvastatin  40 MG tablet Commonly known as: Lipitor  Take 1 tablet (40 mg total) by mouth  daily. What changed:  medication strength how much to take   busPIRone  5 MG tablet Commonly known as: BUSPAR  Take 1 tablet (5 mg total) by mouth 3 (three) times daily.   clopidogrel  75 MG tablet Commonly known as: Plavix  Take 1 tablet (75 mg total) by mouth daily.   cyanocobalamin  1000 MCG/ML injection Commonly known as: VITAMIN B12 Inject 1,000 mg into the muscle every 30 (thirty) days.   cycloSPORINE  0.05 % ophthalmic emulsion Commonly known as: RESTASIS  Place 1 drop into both eyes 2 (two) times daily.   fluconazole  100 MG tablet Commonly known as: DIFLUCAN  Take 1 tablet (100 mg total) by mouth daily for 4 days. Start taking on: May 20, 2023   Geritol Liqd Take 15 mLs by mouth daily.   metoprolol  tartrate 25 MG tablet Commonly known as: LOPRESSOR  Take 0.5 tablets (12.5 mg total) by mouth 2 (two) times daily.   omeprazole 40 MG capsule Commonly known as: PRILOSEC Take 40 mg by mouth 2 (two) times daily.   sucralfate  1 GM/10ML suspension Commonly known as: CARAFATE  Take 10 mLs (1 g total) by mouth every 8 (eight) hours for 6 days.   traMADol  50 MG tablet Commonly known as: ULTRAM  Take 1 tablet (50 mg total) by mouth every 8 (eight) hours as needed (pain).   triamcinolone  0.1 % paste Commonly known as: KENALOG  Use as directed in the mouth or throat 3 (three) times daily for 5 days.        Contact information for follow-up providers     Jerilynn Lamarr HERO, NP Follow up.   Specialties: Cardiology, Radiology, Cardiology Why: Thursday May 20, 2023 Appt at 8:25 AM (25 min) Contact information: [address] [place] [address] [phone]         Elvera Ozell SAUNDERS, PA-C Follow up in 1 week(s).   Specialty: Cardiology Contact information: [facility]  [address] [place] [address] [phone]              Contact information for after-discharge care     Destination     [facility] .    Service: Skilled Nursing Contact information: [address] [place]  [address] [phone]                    Allergies  Allergen Reactions   Lactose Intolerance (Gi) Diarrhea    Consultations: GI, palliative care, ENT   Procedures/Studies: DG ESOPHAGUS W SINGLE CM (SOL OR THIN BA) Result Date: 05/10/2023 CLINICAL DATA:  Weight loss. Poor oral intake. Patient also reports globus sensation in the proximal esophagus. Request for barium esophagram. EXAM: ESOPHAGUS/BARIUM SWALLOW/TABLET STUDY TECHNIQUE: Single contrast examination was performed using thin liquid barium. This exam was performed by Franky  Bruning PA-C , and was supervised and interpreted by Dr. Marcey Moan . FLUOROSCOPY: Radiation Exposure Index (as provided by the fluoroscopic device): 14.90 mGy Kerma COMPARISON:  CT chest/abdomen 05/08/2023 FINDINGS: Swallowing: Appears normal. No vestibular penetration or aspiration seen. Pharynx: Unremarkable. Esophagus: Suggestion of moderate focal narrowing in the proximal esophagus. 13 mm tablet does not advanced beyond this level. Remaining esophagus with irregular and thickened mucosal folds concerning for esophagitis. Distal esophagus also with long segment of smooth mild to moderate stricture. Esophageal motility: Limited evaluation of motility as patient could not lie prone. However, residual contrast stasis in the esophagus suggest some degree of dysmotility. Hiatal Hernia: None seen. Gastroesophageal reflux: None visualized. IMPRESSION: No evidence of aspiration or vestibular penetration. Moderate focal narrowing in the proximal esophagus that results in 13 mm tablet becoming lodged at this level. Irregular and thickened esophageal mucosa concerning for esophagitis. 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 Consider correlation with EGD. Electronically Signed   By: Marcey Moan M.D.   On: 05/10/2023 16:23   CT CHEST  ABDOMEN PELVIS WO CONTRAST Result Date: 05/08/2023 CLINICAL DATA:  Weight loss, unintended weight loss EXAM: CT CHEST, ABDOMEN AND PELVIS WITHOUT CONTRAST TECHNIQUE: Multidetector CT imaging of the chest, abdomen and pelvis was performed following the standard protocol without IV contrast. RADIATION DOSE REDUCTION: This exam was performed according to the departmental dose-optimization program which includes automated exposure control, adjustment of the mA and/or kV according to patient size and/or use of iterative reconstruction technique. COMPARISON:  CT abdomen pelvis 10/23/2019 FINDINGS: CT CHEST FINDINGS Cardiovascular: Ascending thoracic aorta measures 4.2 cm. Proximal descending thoracic aorta measures 3.4 cm. Distal descending thoracic aorta measures 3.0 cm. Diffuse atherosclerotic calcifications in the thoracic aorta. Coronary artery calcifications. Mildly enlarged heart without pericardial effusion. Mediastinum/Nodes: Right thyroid  tissue is absent and this could be postsurgical or congenital. Left thyroid  tissue is unremarkable. No significant lymph node enlargement in the chest but the limited evaluation without intravascular contrast. There is some oral contrast in the esophagus. No axillary lymph node enlargement. Lungs/Pleura: Centrilobular emphysema. Motion artifact limits evaluation of the lungs but no significant airspace disease or lung consolidation. No large pleural effusions. No suspicious lung lesions. Musculoskeletal: Old fractures of left ninth and tenth ribs. No acute bone abnormality. CT ABDOMEN PELVIS FINDINGS Hepatobiliary: Normal appearance of the liver and gallbladder. Pancreas: Unremarkable. No pancreatic ductal dilatation or surrounding inflammatory changes. Spleen: Again noted is a fullness along the medial aspect of the spleen and there is concern for lesion in this area based on the previous contrast study from 2023. This area roughly measures 3.3 x 2.6 cm and previously  measured 3.1 x 2.5 cm. Difficult to evaluate for interval change. Again noted are areas of cortical scarring in the right kidney. Limited evaluation due to motion artifact and streak artifact in the abdomen. Negative for hydronephrosis. Probable areas of cortical scarring in the left kidney are similar to the previous examination. Negative for kidney stones. Moderate amount of fluid in the urinary bladder. No bladder stones. Stomach/Bowel: Colonic diverticulosis without acute inflammation. No bowel dilatation or obstruction. Normal appearance of stomach. Vascular/Lymphatic: Abdominal aorta measures up to 2.9 cm. Diffuse atherosclerotic calcifications involving the abdominal aorta. No significant lymph node enlargement in the abdomen or pelvis. Iliac arteries are calcified. Reproductive: Status post hysterectomy. No adnexal masses. Again noted are calcifications in the bilateral adnexal regions. Other: Negative for free fluid. Negative for free air. Streak artifact in the abdomen due to an external  metallic structure lateral to the right side of the abdomen. Musculoskeletal: No acute bone abnormality. IMPRESSION: 1. No acute abnormality in the chest, abdomen or pelvis. Limited examination due to motion artifact and streak artifact. 2. Indeterminate splenic lesion is again noted. Difficult to evaluate for interval change. This area could be better characterized with postcontrast CT or MR. 3. 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 Recommend annual imaging followup by CTA or MRA. This recommendation follows 2010 ACCF/AHA/AATS/ACR/ASA/SCA/SCAI/SIR/STS/SVM Guidelines for the Diagnosis and Management of Patients with Thoracic Aortic Disease. Circulation. 2010; 121: Z733-z630. Aortic aneurysm NOS (ICD10-I71.9) 4. Colonic diverticulosis without acute inflammation. 5. Aortic Atherosclerosis (ICD10-I70.0) and Emphysema (ICD10-J43.9). Electronically Signed   By: Juliene Balder M.D.   On: 05/08/2023  13:36   CT HEAD WO CONTRAST ( ) Result Date: 05/08/2023 CLINICAL DATA:  Neuro deficit, acute, stroke suspected. Weight loss and weakness. EXAM: CT HEAD WITHOUT CONTRAST TECHNIQUE: Contiguous axial images were obtained from the base of the skull through the vertex without intravenous contrast. RADIATION DOSE REDUCTION: This exam was performed according to the departmental dose-optimization program which includes automated exposure control, adjustment of the mA and/or kV according to patient size and/or use of iterative reconstruction technique. COMPARISON:  CT head without contrast 07/27/2015. FINDINGS: Brain: No acute infarct, hemorrhage, or mass lesion is present. Progressive atrophy and white matter disease is moderately advanced for age. No acute infarct, hemorrhage, or mass lesion is present. The ventricles are proportionate to the degree of atrophy. No significant extraaxial fluid collection is present. The brainstem and cerebellum are within normal limits. Midline structures are within normal limits. Vascular: Atherosclerotic calcifications are present within the cavernous internal carotid arteries bilaterally. No hyperdense vessel is present. Skull: Calvarium is intact. No focal lytic or blastic lesions are present. No significant extracranial soft tissue lesion is present. Sinuses/Orbits: The paranasal sinuses and mastoid air cells are clear. Bilateral lens replacements are noted. Globes and orbits are otherwise unremarkable. IMPRESSION: 1. No acute intracranial abnormality or significant interval change. 2. Progressive atrophy and white matter disease is moderately advanced for age. This likely reflects the sequela of chronic microvascular ischemia. Electronically Signed   By: Lonni Necessary M.D.   On: 05/08/2023 10:49   ECHOCARDIOGRAM COMPLETE Result Date: 05/04/2023    ECHOCARDIOGRAM REPORT   Patient Name:   Holly Guerra Date of Exam: 05/04/2023 Medical Rec #:  [id number]  Height:       62.0  in Accession #:    [id number] Weight:       90.0 lb Date of Birth:  29-Jul-1940  BSA:          1.362 m Patient Age:    82 years   BP:           170/86 mmHg Patient Gender: F          HR:           75 bpm. Exam Location:  Inpatient Procedure: 2D Echo, Cardiac Doppler and Color Doppler Indications:    acute systolic chf.  History:        Patient has prior history of Echocardiogram examinations, most                 recent 01/08/2020. Aortic Valve Disease, Signs/Symptoms:Dyspnea;                 Risk Factors:Hypertension.  Sonographer:    Tinnie Barefoot RDCS Referring Phys: [id number] SUBRINA SUNDIL IMPRESSIONS  1. Papillary muscle hypertrophy. Left ventricular ejection fraction,  by estimation, is 45 to 50%. The left ventricle has mildly decreased function. The left ventricle demonstrates regional wall motion abnormalities (see scoring diagram/findings for description). 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 Left ventricular diastolic parameters are consistent with Grade II diastolic dysfunction (pseudonormalization). Basal function is preserved.  2. Right ventricular systolic function is normal. The right ventricular size is normal. Tricuspid regurgitation signal is inadequate for assessing PA pressure.  3. Left atrial size was severely dilated.  4. Mitral valve leaflet thickening and doming without clear calcifications. The mitral valve is abnormal. Mild mitral valve regurgitation.  5. The aortic valve is calcified. Aortic valve regurgitation is mild to moderate. Moderate aortic valve stenosis. Aortic regurgitation PHT measures 306 msec. Aortic valve area, by VTI measures 1.02 cm. Aortic valve Vmax measures 3.02 m/s.  6. There is mild dilatation of the ascending aorta, measuring 39 mm. Mild when indexed to age, gender, and BSA.  7. The inferior vena cava is normal in size with greater than 50% respiratory variability, suggesting right atrial pressure of 3 mmHg. Comparison(s):  Prior images unable to be directly viewed, comparison made by report only. FINDINGS  Left Ventricle: Papillary muscle hypertrophy. Left ventricular ejection fraction, by estimation, is 45 to 50%. The left ventricle has mildly decreased function. The left ventricle demonstrates regional wall motion abnormalities. The left ventricular internal cavity size was normal in size. 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 Left ventricular diastolic parameters are consistent with Grade II diastolic dysfunction (pseudonormalization).  LV Wall Scoring: The mid and distal anterior wall, mid and distal lateral wall, mid and distal anterior septum, mid and distal inferior wall, mid anterolateral segment, and mid inferoseptal segment are hypokinetic. The basal anteroseptal segment, basal inferolateral segment, basal anterolateral segment, basal anterior segment, basal inferior segment, and basal inferoseptal segment are normal. Basal function is preserved. Right Ventricle: The right ventricular size is normal. No increase in right ventricular wall thickness. Right ventricular systolic function is normal. Tricuspid regurgitation signal is inadequate for assessing PA pressure. Left Atrium: Left atrial size was severely dilated. Right Atrium: Right atrial size was normal in size. Pericardium: Trivial pericardial effusion is present. Presence of epicardial fat layer. Mitral Valve: Mitral valve leaflet thickening and doming without clear calcifications. The mitral valve is abnormal. There is mild thickening of the mitral valve leaflet(s). Mild mitral valve regurgitation. Tricuspid Valve: The tricuspid valve is normal in structure. Tricuspid valve regurgitation is trivial. No evidence of tricuspid stenosis. Aortic Valve: The aortic valve is calcified. Aortic valve regurgitation is mild to moderate. Aortic regurgitation PHT measures 306 msec. Moderate aortic stenosis is present. Aortic valve mean  gradient measures 18.0 mmHg. Aortic valve peak gradient measures 36.5 mmHg. Aortic valve area, by VTI measures 1.02 cm. Pulmonic Valve: The pulmonic valve was normal in structure. Pulmonic valve regurgitation is mild. Aorta: The aortic root is normal in size and structure. There is mild dilatation of the ascending aorta, measuring 39 mm. Venous: The inferior vena cava is normal in size with greater than 50% respiratory variability, suggesting right atrial pressure of 3 mmHg. IAS/Shunts: No atrial level shunt detected by color flow Doppler.  LEFT VENTRICLE PLAX 2D LVIDd:         4.50 cm LVIDs:         2.50 cm LV PW:         1.60 cm LV IVS:        1.10 cm LVOT diam:  1.90 cm LV SV:         46 LV SV Index:   34 LVOT Area:     2.84 cm  LV Volumes (MOD) LV vol d, MOD A4C: 74.2 ml LV vol s, MOD A4C: 33.1 ml LV SV MOD A4C:     74.2 ml RIGHT VENTRICLE          IVC RV Basal diam:  2.60 cm  IVC diam: 1.40 cm LEFT ATRIUM              Index        RIGHT ATRIUM           Index LA diam:        3.40 cm  2.50 cm/m   RA Area:     11.60 cm LA Vol (A2C):   63.9 ml  46.93 ml/m  RA Volume:   23.80 ml  17.48 ml/m LA Vol (A4C):   103.0 ml 75.65 ml/m LA Biplane Vol: 88.0 ml  64.63 ml/m  AORTIC VALVE AV Area (Vmax):    0.88 cm AV Area (Vmean):   0.91 cm AV Area (VTI):     1.02 cm AV Vmax:           302.00 cm/s AV Vmean:          190.000 cm/s AV VTI:            0.452 m AV Peak Grad:      36.5 mmHg AV Mean Grad:      18.0 mmHg LVOT Vmax:         93.80 cm/s LVOT Vmean:        61.067 cm/s LVOT VTI:          0.163 m LVOT/AV VTI ratio: 0.36 AI PHT:            306 msec  AORTA Ao Root diam: 3.30 cm Ao Asc diam:  3.85 cm  SHUNTS Systemic VTI:  0.16 m Systemic Diam: 1.90 cm Stanly Leavens MD Electronically signed by Stanly Leavens MD Signature Date/Time: 05/04/2023/1:15:35 PM    Final    DG Chest Portable 1 View Result Date: 05/03/2023 CLINICAL DATA:  Shortness of breath EXAM: PORTABLE CHEST 1 VIEW COMPARISON:   10/22/2021 FINDINGS: Gross cardiomegaly. Mild diffuse bilateral interstitial pulmonary opacity. The visualized skeletal structures are unremarkable. IMPRESSION: Gross cardiomegaly with mild diffuse bilateral interstitial pulmonary opacity, consistent with edema or atypical/viral infection. No focal airspace opacity. Electronically Signed   By: Marolyn JONETTA Jaksch M.D.   On: 05/03/2023 17:18     Discharge Exam: Vitals:   05/19/23 0000 05/19/23 0343  BP:  120/67  Pulse:  83  Resp:  19  Temp:  98.5 F (36.9 C)  SpO2: 96% 97%   Vitals:   05/18/23 2000 05/18/23 2139 05/19/23 0000 05/19/23 0343  BP:  114/72  120/67  Pulse:  89  83  Resp:    19  Temp:    98.5 F (36.9 C)  TempSrc:    Oral  SpO2: 98%  96% 97%  Weight:    40.6 kg  Height:        General: Pt is alert, awake, not in acute distress Cardiovascular: RRR, S1/S2 +, no rubs, no gallops Respiratory: CTA bilaterally, no wheezing, no rhonchi Abdominal: Soft, NT, ND, bowel sounds + Extremities: no edema, no cyanosis    The results of significant diagnostics from this hospitalization (including imaging, microbiology, ancillary and laboratory) are listed below for reference.  Microbiology: No results found for this or any previous visit (from the past 240 hours).   Labs: BNP (last 3 results) Recent Labs    05/03/23 1557  BNP 4,119.6*   Basic Metabolic Panel: Recent Labs  Lab 05/13/23 0422 05/14/23 1042 05/19/23 0833  NA 140 139 137  K 3.8 4.0 4.3  CL 104 106 102  CO2 26 24 27   GLUCOSE 142* 146* 142*  BUN 8 11 16   CREATININE 1.07* 0.97 0.85  CALCIUM  9.3 9.1 9.6  MG 2.0  --   --   PHOS 2.5  --   --    Liver Function Tests: Recent Labs  Lab 05/13/23 0422  AST 28  ALT 30  ALKPHOS 55  BILITOT 0.4  PROT 6.7  ALBUMIN 2.8*   No results for input(s): LIPASE, AMYLASE in the last 168 hours. No results for input(s): AMMONIA in the last 168 hours. CBC: Recent Labs  Lab 05/13/23 0422 05/14/23 0537  05/15/23 0338 05/16/23 0358 05/17/23 0318 05/18/23 0459  WBC 5.1 4.4 4.4 5.6 5.3 5.4  NEUTROABS 3.2  --   --   --   --   --   HGB 9.3* 9.1* 8.2* 8.3* 8.0* 8.3*  HCT 30.2* 29.7* 26.3* 26.8* 26.7* 26.7*  MCV 67.6* 67.5* 67.1* 67.3* 67.6* 67.4*  PLT 268 270 250 235 217 252   Cardiac Enzymes: No results for input(s): CKTOTAL, CKMB, CKMBINDEX, TROPONINI in the last 168 hours. BNP: Invalid input(s): POCBNP CBG: No results for input(s): GLUCAP in the last 168 hours. D-Dimer No results for input(s): DDIMER in the last 72 hours. Hgb A1c No results for input(s): HGBA1C in the last 72 hours. Lipid Profile No results for input(s): CHOL, HDL, LDLCALC, TRIG, CHOLHDL, LDLDIRECT in the last 72 hours. Thyroid  function studies No results for input(s): TSH, T4TOTAL, T3FREE, THYROIDAB in the last 72 hours.  Invalid input(s): FREET3 Anemia work up No results for input(s): VITAMINB12, FOLATE, FERRITIN, TIBC, IRON, RETICCTPCT in the last 72 hours. Urinalysis    Component Value Date/Time   COLORURINE YELLOW 05/07/2023 1847   APPEARANCEUR CLEAR 05/07/2023 1847   LABSPEC 1.013 05/07/2023 1847   PHURINE 6.0 05/07/2023 1847   GLUCOSEU 50 (A) 05/07/2023 1847   HGBUR SMALL (A) 05/07/2023 1847   BILIRUBINUR NEGATIVE 05/07/2023 1847   KETONESUR 5 (A) 05/07/2023 1847   PROTEINUR NEGATIVE 05/07/2023 1847   NITRITE NEGATIVE 05/07/2023 1847   LEUKOCYTESUR SMALL (A) 05/07/2023 1847   Sepsis Labs Recent Labs  Lab 05/15/23 0338 05/16/23 0358 05/17/23 0318 05/18/23 0459  WBC 4.4 5.6 5.3 5.4   Microbiology No results found for this or any previous visit (from the past 240 hours).  FURTHER DISCHARGE INSTRUCTIONS:   Get Medicines reviewed and adjusted: Please take all your medications with you for your next visit with your Primary MD   Laboratory/radiological data: Please request your Primary MD to go over all hospital tests and  procedure/radiological results at the follow up, please ask your Primary MD to get all Hospital records sent to his/her office.   In some cases, they will be blood work, cultures and biopsy results pending at the time of your discharge. Please request that your primary care M.D. goes through all the records of your hospital data and follows up on these results.   Also Note the following: If you experience worsening of your admission symptoms, develop shortness of breath, life threatening emergency, suicidal or homicidal thoughts you must seek medical attention immediately by calling 911 or  calling your MD immediately  if symptoms less severe.   You must read complete instructions/literature along with all the possible adverse reactions/side effects for all the Medicines you take and that have been prescribed to you. Take any new Medicines after you have completely understood and accpet all the possible adverse reactions/side effects.    Do not drive when taking Pain medications or sleeping medications (Benzodaizepines)   Do not take more than prescribed Pain, Sleep and Anxiety Medications. It is not advisable to combine anxiety,sleep and pain medications without talking with your primary care practitioner   Special Instructions: If you have smoked or chewed Tobacco  in the last 2 yrs please stop smoking, stop any regular Alcohol  and or any Recreational drug use.   Wear Seat belts while driving.   Please note: You were cared for by a hospitalist during your hospital stay. Once you are discharged, your primary care physician will handle any further medical issues. Please note that NO REFILLS for any discharge medications will be authorized once you are discharged, as it is imperative that you return to your primary care physician (or establish a relationship with a primary care physician if you do not have one) for your post hospital discharge needs so that they can reassess your need for  medications and monitor your lab values  Time coordinating discharge: Over 30 minutes  SIGNED:   Fredia Skeeter, MD  [facility] 05/19/2023, 12:59 PM *Please note that this is a verbal dictation therefore any spelling or grammatical errors are due to the Dragon Medical One system interpretation. If 7PM-7AM, please contact night-coverage [email or web address]

## 2023-05-19 NOTE — Progress Notes (Signed)
   Palliative Medicine Inpatient Follow Up Note HPI: [name] with history of aortic insufficiency, peripheral vascular disease, GERD, essential hypertension, pernicious anemia presented to the ED yesterday with multiple symptoms, progressive shortness of breath X 1 week, some swelling, cough and orthopnea, in addition also complained of congestion sinus pain and headaches.    The Palliative care team has been asked to get involved to support additional goals of care conversations, code status, and discuss patients ongoing adult failure to thrive.   Today's Discussion 05/19/2023  *Please note that this is a verbal dictation therefore any spelling or grammatical errors are due to the Dragon Medical One system interpretation.  Chart reviewed inclusive of vital signs, progress notes, laboratory results, and diagnostic images.   I met with Holly Guerra at bedside this morning. Per staff she has been up and ambulatory all day. She appeared fairly steady on her feet during my time with her. She is pleasant and tells me about her grand-dog that passed away about one year ago. She shares that this remains to effect her. Allowed her time to reflect on the love of the dog and the impact of the loss.  Discussed the plan for discharge. Holly Guerra appears hopeful for this in the near future. I shared it is likely she will discharge somewhere today to gain strength.   Questions and concerns addressed/Palliative Support Provided.   Objective Assessment: Vital Signs Vitals:   05/19/23 0000 05/19/23 0343  BP:  120/67  Pulse:  83  Resp:  19  Temp:  98.5 F (36.9 C)  SpO2: 96% 97%    Intake/Output Summary (Last 24 hours) at 05/19/2023 1151 Last data filed at 05/19/2023 0830 Gross per 24 hour  Intake 240 ml  Output --  Net 240 ml   Last Weight  Most recent update: 05/19/2023  3:47 AM    Weight  40.6 kg (89 lb 9.6 oz)            Gen:  Elderly AA F chronically ill appearing HEENT: moist mucous membranes CV:  Regular rate and irregular rhythm  PULM:  On RA, breathing is even and nonlabored ABD: soft/nontender  EXT: No edema  Neuro: Alert and oriented to person and place  SUMMARY OF RECOMMENDATIONS   DNAR/DNI  Plan for discharge today today to  [facility]  OP Palliative [facility] been [facility]  Nicotine  withdrawal and anxiety have been address from a symptom management perspective  Time: 35 ______________________________________________________________________________________ Holly Guerra Palliative Medicine Team Team Cell Phone: [phone] Please utilize secure chat with additional questions, if there is no response within 30 minutes please call the above phone number  Palliative Medicine Team providers are available by phone from 7am to 7pm daily and can be reached through the team cell phone.  Should this patient require assistance outside of these hours, please call the patient's attending physician.

## 2023-05-19 NOTE — TOC Transition Note (Signed)
 Transition of Care [facility]) - Discharge Note   Patient Details  Name: Holly Guerra MRN: [id number] Date of Birth: [birth year]  Transition of [facility]) CM/SW Contact:  Isaiah Public, LCSWA Phone Number: 05/19/2023, 2:54 PM   Clinical Narrative:     Patient will DC to: [facility] SNF  Anticipated DC date: 05/19/2023  Family notified: [name]   Transport by: [facility]  ?  Per MD patient ready for DC to [facility] SN[facility] with palliative services to follow . RN, patient, patient's family,Cheri with [facility], and facility notified of DC. Discharge Summary sent to facility. RN given number for report [phone] RM# [phone]. DC packet on chart. DNR signed by MD attached to patients DC packet.Ambulance transport requested for patient.  CSW signing off.   Final next level of care: Skilled Nursing Facility Barriers to Discharge: No Barriers Identified   Patient Goals and CMS Choice     Choice offered to / list presented to : Adult Children (daughter [name])      Discharge Placement              Patient chooses bed at: [facility] Patient to be transferred to facility by: [facility] Name of family member notified: [name] Patient and family notified of of transfer: 05/19/23  Discharge Plan and Services Additional resources added to the After Visit Summary for                                       Social Drivers of Health (SDOH) Interventions SDOH Screenings   Food Insecurity: No Food Insecurity (05/04/2023)  Housing: Low Risk  (05/06/2023)  Transportation Needs: No Transportation Needs (05/06/2023)  Utilities: Not At Risk (05/04/2023)  Alcohol Screen: Low Risk  (05/06/2023)  Financial Resource Strain: Low Risk  (05/06/2023)  Tobacco Use: High Risk (05/15/2023)     Readmission Risk Interventions     No data to display

## 2023-05-19 NOTE — TOC Progression Note (Addendum)
 Transition of Care [facility]) - Progression Note    Patient Details  Name: Holly Guerra MRN: [id number] Date of Birth: 09-01-1940  Transition [facility] Care [facility]) CM/SW Contact  Isaiah Public, LCSWA Phone Number: 05/19/2023, 10:36 AM  Clinical Narrative:     CSW received call from patients insurance. Patients insurance authorization for SNF went to peer to peer.  Please call tele# [phone] option 5. Deadline is by 1/8 at 12:00 noon. CSW informed MD. CSW will continue to follow.  Update- CSW spoke with patients insurance. Peer to peer was approved for SNF. Auth ID# [id number]. Auth approval is from 1/8-1/10. CSW spoke with Cheree with [facility] who informed CSW that patient can dc over today if medically ready. CSW informed MD.  Expected Discharge Plan:  (Home health vs SNF) Barriers to Discharge: SNF Pending bed offer  Expected Discharge Plan and Services       Living arrangements for the past 2 months: Single Family Home                                       Social Determinants of Health (SDOH) Interventions SDOH Screenings   Food Insecurity: No Food Insecurity (05/04/2023)  Housing: Low Risk  (05/06/2023)  Transportation Needs: No Transportation Needs (05/06/2023)  Utilities: Not At Risk (05/04/2023)  Alcohol Screen: Low Risk  (05/06/2023)  Financial Resource Strain: Low Risk  (05/06/2023)  Tobacco Use: High Risk (05/15/2023)    Readmission Risk Interventions     No data to display

## 2023-05-20 ENCOUNTER — Ambulatory Visit: Payer: Medicare Other | Admitting: Adult Health

## 2023-05-22 ENCOUNTER — Inpatient Hospital Stay (HOSPITAL_COMMUNITY)
Admission: EM | Admit: 2023-05-22 | Discharge: 2023-05-27 | DRG: 521 | Disposition: A | Discharge status: Skilled Nursing Facility | Payer: Medicare Other | Source: Skilled Nursing Facility | Attending: Internal Medicine | Admitting: Internal Medicine

## 2023-05-22 ENCOUNTER — Emergency Department (HOSPITAL_COMMUNITY): Payer: Medicare Other

## 2023-05-22 ENCOUNTER — Encounter (HOSPITAL_COMMUNITY): Payer: Self-pay | Admitting: Emergency Medicine

## 2023-05-22 ENCOUNTER — Other Ambulatory Visit: Payer: Self-pay

## 2023-05-22 DIAGNOSIS — Z8249 Family history of ischemic heart disease and other diseases of the circulatory system: Secondary | ICD-10-CM

## 2023-05-22 DIAGNOSIS — H509 Unspecified strabismus: Secondary | ICD-10-CM | POA: Diagnosis present

## 2023-05-22 DIAGNOSIS — R1310 Dysphagia, unspecified: Secondary | ICD-10-CM | POA: Diagnosis present

## 2023-05-22 DIAGNOSIS — I712 Thoracic aortic aneurysm, without rupture, unspecified: Secondary | ICD-10-CM | POA: Diagnosis present

## 2023-05-22 DIAGNOSIS — I10 Essential (primary) hypertension: Secondary | ICD-10-CM | POA: Diagnosis present

## 2023-05-22 DIAGNOSIS — Z66 Do not resuscitate: Secondary | ICD-10-CM | POA: Diagnosis present

## 2023-05-22 DIAGNOSIS — Z95820 Peripheral vascular angioplasty status with implants and grafts: Secondary | ICD-10-CM

## 2023-05-22 DIAGNOSIS — W010XXA Fall on same level from slipping, tripping and stumbling without subsequent striking against object, initial encounter: Secondary | ICD-10-CM | POA: Diagnosis present

## 2023-05-22 DIAGNOSIS — D509 Iron deficiency anemia, unspecified: Secondary | ICD-10-CM | POA: Diagnosis not present

## 2023-05-22 DIAGNOSIS — E782 Mixed hyperlipidemia: Secondary | ICD-10-CM | POA: Diagnosis present

## 2023-05-22 DIAGNOSIS — D573 Sickle-cell trait: Secondary | ICD-10-CM | POA: Diagnosis present

## 2023-05-22 DIAGNOSIS — Z720 Tobacco use: Secondary | ICD-10-CM | POA: Diagnosis present

## 2023-05-22 DIAGNOSIS — S72001A Fracture of unspecified part of neck of right femur, initial encounter for closed fracture: Secondary | ICD-10-CM | POA: Diagnosis present

## 2023-05-22 DIAGNOSIS — K219 Gastro-esophageal reflux disease without esophagitis: Secondary | ICD-10-CM | POA: Diagnosis present

## 2023-05-22 DIAGNOSIS — F17210 Nicotine dependence, cigarettes, uncomplicated: Secondary | ICD-10-CM | POA: Diagnosis present

## 2023-05-22 DIAGNOSIS — S72011A Unspecified intracapsular fracture of right femur, initial encounter for closed fracture: Principal | ICD-10-CM | POA: Diagnosis present

## 2023-05-22 DIAGNOSIS — E739 Lactose intolerance, unspecified: Secondary | ICD-10-CM | POA: Diagnosis present

## 2023-05-22 DIAGNOSIS — I480 Paroxysmal atrial fibrillation: Secondary | ICD-10-CM | POA: Diagnosis present

## 2023-05-22 DIAGNOSIS — Z7902 Long term (current) use of antithrombotics/antiplatelets: Secondary | ICD-10-CM

## 2023-05-22 DIAGNOSIS — Z681 Body mass index (BMI) 19 or less, adult: Secondary | ICD-10-CM

## 2023-05-22 DIAGNOSIS — D510 Vitamin B12 deficiency anemia due to intrinsic factor deficiency: Secondary | ICD-10-CM | POA: Diagnosis present

## 2023-05-22 DIAGNOSIS — Z90710 Acquired absence of both cervix and uterus: Secondary | ICD-10-CM

## 2023-05-22 DIAGNOSIS — Z7901 Long term (current) use of anticoagulants: Secondary | ICD-10-CM

## 2023-05-22 DIAGNOSIS — R739 Hyperglycemia, unspecified: Secondary | ICD-10-CM

## 2023-05-22 DIAGNOSIS — Z79899 Other long term (current) drug therapy: Secondary | ICD-10-CM

## 2023-05-22 DIAGNOSIS — I110 Hypertensive heart disease with heart failure: Secondary | ICD-10-CM | POA: Diagnosis present

## 2023-05-22 DIAGNOSIS — R000 Tachycardia, unspecified: Secondary | ICD-10-CM | POA: Diagnosis present

## 2023-05-22 DIAGNOSIS — Z716 Tobacco abuse counseling: Secondary | ICD-10-CM

## 2023-05-22 DIAGNOSIS — I739 Peripheral vascular disease, unspecified: Secondary | ICD-10-CM | POA: Diagnosis present

## 2023-05-22 DIAGNOSIS — E43 Unspecified severe protein-calorie malnutrition: Secondary | ICD-10-CM | POA: Diagnosis present

## 2023-05-22 DIAGNOSIS — F0390 Unspecified dementia without behavioral disturbance: Secondary | ICD-10-CM | POA: Diagnosis present

## 2023-05-22 DIAGNOSIS — I5022 Chronic systolic (congestive) heart failure: Secondary | ICD-10-CM

## 2023-05-22 DIAGNOSIS — J439 Emphysema, unspecified: Secondary | ICD-10-CM | POA: Diagnosis present

## 2023-05-22 DIAGNOSIS — E890 Postprocedural hypothyroidism: Secondary | ICD-10-CM | POA: Diagnosis present

## 2023-05-22 DIAGNOSIS — M1990 Unspecified osteoarthritis, unspecified site: Secondary | ICD-10-CM | POA: Diagnosis present

## 2023-05-22 MED ORDER — MORPHINE SULFATE (PF) 4 MG/ML IV SOLN: 4.0000 mg | INTRAVENOUS | Status: DC | PRN

## 2023-05-22 NOTE — ED Provider Notes (Signed)
 [facility] Ed Provider Note   CSN: [id number] Arrival date & time: 05/22/23  2242     History  Chief Complaint  Patient presents with   [place]    Holly Guerra is a 83 y.o. female.  The history is provided by the patient.  Fall  She has history of hypertension, hyperlipidemia, chronic kidney disease, peripheral vascular disease, atrial fibrillation anticoagulated on apixaban  and comes in following a fall.  She lost her balance and fell injuring her right hip.  She denies head or neck or back injury.   Home Medications Prior to Admission medications   Medication Sig Start Date End Date Taking? Authorizing Provider  acetaminophen  (TYLENOL ) 500 MG tablet Take 1,000 mg by mouth every 6 (six) hours as needed for mild pain (pain score 1-3) or moderate pain (pain score 4-6).    [provider]  allopurinol  (ZYLOPRIM ) 100 MG tablet Take 1 tablet (100 mg total) by mouth every evening. 05/19/23   Vernon Ranks, MD  apixaban  (ELIQUIS ) 2.5 MG TABS tablet Take 1 tablet (2.5 mg total) by mouth 2 (two) times daily. 05/19/23 06/18/23  Vernon Ranks, MD  atorvastatin  (LIPITOR ) 40 MG tablet Take 1 tablet (40 mg total) by mouth daily. 05/19/23 05/18/24  Vernon Ranks, MD  busPIRone  (BUSPAR ) 5 MG tablet Take 1 tablet (5 mg total) by mouth 3 (three) times daily. 05/19/23 06/18/23  Vernon Ranks, MD  clopidogrel  (PLAVIX ) 75 MG tablet Take 1 tablet (75 mg total) by mouth daily. 07/24/12   Marcine Catalan M, PA-C  cyanocobalamin  (,VITAMIN B-12,) 1000 MCG/ML injection Inject 1,000 mg into the muscle every 30 (thirty) days. 03/08/20   [provider]  cycloSPORINE  (RESTASIS ) 0.05 % ophthalmic emulsion Place 1 drop into both eyes 2 (two) times daily.    [provider]  fluconazole  (DIFLUCAN ) 100 MG tablet Take 1 tablet (100 mg total) by mouth daily for 4 days. 05/20/23 05/24/23  Vernon Ranks, MD  Iron-Vitamins (GERITOL) LIQD Take 15 mLs by mouth daily.     [provider]  metoprolol  tartrate (LOPRESSOR ) 25 MG tablet Take 0.5 tablets (12.5 mg total) by mouth 2 (two) times daily. 05/19/23 06/18/23  Vernon Ranks, MD  omeprazole (PRILOSEC) 40 MG capsule Take 40 mg by mouth 2 (two) times daily.    [provider]  sucralfate  (CARAFATE ) 1 GM/10ML suspension Take 10 mLs (1 g total) by mouth every 8 (eight) hours for 6 days. 05/19/23 05/25/23  Vernon Ranks, MD  traMADol  (ULTRAM ) 50 MG tablet Take 1 tablet (50 mg total) by mouth every 8 (eight) hours as needed (pain). 05/17/23   Vernon Ranks, MD  triamcinolone  (KENALOG ) 0.1 % paste Use as directed in the mouth or throat 3 (three) times daily for 5 days. 05/19/23 05/24/23  Vernon Ranks, MD      Allergies    Lactose intolerance (gi)    Review of Systems   Review of Systems  All other systems reviewed and are negative.   Physical Exam Updated Vital Signs BP (!) 155/90 (BP Location: Right Arm)   Pulse 94   Temp 98.7 F (37.1 C) (Oral)   Resp (!) 25   Ht 5' 1 (1.549 m)   Wt 40.4 kg   SpO2 93%   BMI 16.82 kg/m  Physical Exam Vitals and nursing note reviewed.   83 year old female, resting comfortably and in no acute distress. Vital signs are significant for elevated blood pressure and elevated respiratory rate. Oxygen  saturation is 93%, which is normal. Head is normocephalic and atraumatic. PERRLA, EOMI. Oropharynx is clear. Neck is nontender. Back is nontender. Lungs are clear without rales, wheezes, or rhonchi. Chest is nontender. Heart has an irregular rhythm with 3/6 systolic ejection murmur best heard along the left sternal border. Abdomen is soft, flat, nontender. Extremities: Right leg is externally rotated but not shortened.  There is tenderness palpation at the right hip and there is marked pain with any movement of the right hip.  Distal neurovascular exam is intact with strong dorsalis pedis pulse and prompt capillary refill and normal sensation.  Full range of motion all  other joints without pain. Skin is warm and dry without rash. Neurologic: Mental status is normal, cranial nerves are intact, there are no motor or sensory deficits.  ED Results / Procedures / Treatments   Labs (all labs ordered are listed, but only abnormal results are displayed) Labs Reviewed  BASIC METABOLIC PANEL - Abnormal; Notable for the following components:      Result Value   Glucose, Bld 128 (*)    All other components within normal limits  CBC WITH DIFFERENTIAL/PLATELET - Abnormal; Notable for the following components:   RBC 3.32 (*)    Hemoglobin 7.1 (*)    HCT 23.3 (*)    MCV 70.2 (*)    MCH 21.4 (*)    RDW 21.6 (*)    Neutro Abs 8.2 (*)    All other components within normal limits  PROTIME-INR  TYPE AND SCREEN  ABO/RH    EKG EKG Interpretation Date/Time:  Sunday May 23 2023 01:07:12 EST Ventricular Rate:  92 PR Interval:  126 QRS Duration:  88 QT Interval:  393 QTC Calculation: 487 R Axis:   72  Text Interpretation: Sinus rhythm Atrial premature complex LVH with secondary repolarization abnormality ST depr, consider ischemia, inferior leads Borderline prolonged QT interval When compared with ECG of 05/10/2023, No significant change was found Confirmed by Raford Lenis ([id number]) on 05/23/2023 1:48:16 AM  Radiology DG Chest Port 1 View Result Date: 05/22/2023 CLINICAL DATA:  Hip fracture, preop. EXAM: PORTABLE CHEST 1 VIEW COMPARISON:  Chest CT 05/08/2023 FINDINGS: The heart is upper normal in size. Diffuse aortic atherosclerosis. The lungs are clear. Pulmonary vasculature is normal. No consolidation, pleural effusion, or pneumothorax. No acute osseous abnormalities are seen. IMPRESSION: No active disease. Electronically Signed   By: Andrea Gasman M.D.   On: 05/22/2023 23:29   DG Hip Unilat W or Wo Pelvis 2-3 Views Right Result Date: 05/22/2023 CLINICAL DATA:  Status post fall with hip pain. EXAM: DG HIP (WITH OR WITHOUT PELVIS) 2-3V RIGHT COMPARISON:  None  Available. FINDINGS: Displaced right femoral neck fracture. Mild proximal migration of the femoral shaft. Femoral head remains seated. The bones are diffusely under mineralized. Intact bony pelvis including pubic rami. There is retained barium within multiple colonic diverticula. Vascular calcifications are seen. IMPRESSION: Displaced right femoral neck fracture. Electronically Signed   By: Andrea Gasman M.D.   On: 05/22/2023 23:28    Procedures Procedures  Cardiac monitor shows atrial fibrillation with controlled ventricular response, per my interpretation.  Medications Ordered in ED Medications  morphine  (PF) 4 MG/ML injection 4 mg (has no administration in time range)  oxyCODONE -acetaminophen  (PERCOCET/ROXICET) 5-325 MG per tablet 1 tablet (1 tablet Oral Given 05/23/23 0102)    ED Course/ Medical Decision Making/ A&P   Cardiac monitor shows normal sinus rhythm, per my interpretation.  Medical Decision Making Amount and/or Complexity of Data Reviewed Labs: ordered. Radiology: ordered.  Risk Prescription drug management. Decision regarding hospitalization.   Fall with apparent right hip fracture.  She is anticoagulated, will need to have CT scans of head and cervical spine.  Because of fall on blood thinner, she technically meets criteria for level 2 trauma.  Hip x-ray shows displaced femoral neck fracture, chest x-ray shows no active cardiopulmonary disease.  I have independently viewed the images, and agree with the radiologist's interpretation.  I have ordered CT scans of head and cervical spine, routine preoperative laboratory tests.  I have discussed the case with Dr. Sherida of orthopedic service who states that he will plan on operative management after she has adequately metabolized her anticoagulant.  Last dose of apixaban  was 9 PM.  I have reviewed her laboratory test, my interpretation is elevated random glucose level, anemia with drop of 1.2 g  compared with 05/18/2023 which is likely from blood loss from her fracture, normal coagulation studies.  I have reviewed her electrocardiogram, my interpretation is left ventricular hypertrophy with secondary repolarization changes unchanged from prior.  I have discussed the case with Dr. Charlton of [facility], who agrees to admit the patient.        Final Clinical Impression(s) / ED Diagnoses Final diagnoses:  Closed subcapital fracture of neck of right femur, initial encounter (HCC)  Chronic anticoagulation  Microcytic anemia  Elevated random blood glucose level    Rx / DC Orders ED Discharge Orders     None         Raford Lenis, MD 05/23/23 0200

## 2023-05-22 NOTE — ED Triage Notes (Signed)
 Per PTAR, pt has a witnessed fall by staff at [facility].  Pt did not hit her head or LOC, c/o right hip and thigh pain.  Pt is on elequis.  Pt is alert and oriented but PTAR indicated that daughter makes all decisions  146/78 86HR  18RR 98% RA 161 BG

## 2023-05-23 ENCOUNTER — Inpatient Hospital Stay (HOSPITAL_COMMUNITY): Payer: Medicare Other | Admitting: Anesthesiology

## 2023-05-23 ENCOUNTER — Encounter (HOSPITAL_COMMUNITY): Payer: Self-pay | Admitting: Family Medicine

## 2023-05-23 DIAGNOSIS — J439 Emphysema, unspecified: Secondary | ICD-10-CM | POA: Diagnosis present

## 2023-05-23 DIAGNOSIS — Z681 Body mass index (BMI) 19 or less, adult: Secondary | ICD-10-CM | POA: Diagnosis not present

## 2023-05-23 DIAGNOSIS — W010XXA Fall on same level from slipping, tripping and stumbling without subsequent striking against object, initial encounter: Secondary | ICD-10-CM | POA: Diagnosis present

## 2023-05-23 DIAGNOSIS — I712 Thoracic aortic aneurysm, without rupture, unspecified: Secondary | ICD-10-CM | POA: Diagnosis present

## 2023-05-23 DIAGNOSIS — I10 Essential (primary) hypertension: Secondary | ICD-10-CM | POA: Diagnosis not present

## 2023-05-23 DIAGNOSIS — Z95820 Peripheral vascular angioplasty status with implants and grafts: Secondary | ICD-10-CM | POA: Diagnosis not present

## 2023-05-23 DIAGNOSIS — E782 Mixed hyperlipidemia: Secondary | ICD-10-CM | POA: Diagnosis present

## 2023-05-23 DIAGNOSIS — E739 Lactose intolerance, unspecified: Secondary | ICD-10-CM | POA: Diagnosis present

## 2023-05-23 DIAGNOSIS — E43 Unspecified severe protein-calorie malnutrition: Secondary | ICD-10-CM | POA: Diagnosis present

## 2023-05-23 DIAGNOSIS — I480 Paroxysmal atrial fibrillation: Secondary | ICD-10-CM | POA: Diagnosis present

## 2023-05-23 DIAGNOSIS — I5022 Chronic systolic (congestive) heart failure: Secondary | ICD-10-CM | POA: Diagnosis present

## 2023-05-23 DIAGNOSIS — S72011A Unspecified intracapsular fracture of right femur, initial encounter for closed fracture: Secondary | ICD-10-CM | POA: Diagnosis present

## 2023-05-23 DIAGNOSIS — F0390 Unspecified dementia without behavioral disturbance: Secondary | ICD-10-CM | POA: Diagnosis present

## 2023-05-23 DIAGNOSIS — S72001A Fracture of unspecified part of neck of right femur, initial encounter for closed fracture: Secondary | ICD-10-CM | POA: Diagnosis present

## 2023-05-23 DIAGNOSIS — Z90710 Acquired absence of both cervix and uterus: Secondary | ICD-10-CM | POA: Diagnosis not present

## 2023-05-23 DIAGNOSIS — D509 Iron deficiency anemia, unspecified: Secondary | ICD-10-CM | POA: Diagnosis present

## 2023-05-23 DIAGNOSIS — D510 Vitamin B12 deficiency anemia due to intrinsic factor deficiency: Secondary | ICD-10-CM | POA: Diagnosis present

## 2023-05-23 DIAGNOSIS — I739 Peripheral vascular disease, unspecified: Secondary | ICD-10-CM

## 2023-05-23 DIAGNOSIS — Z66 Do not resuscitate: Secondary | ICD-10-CM | POA: Diagnosis present

## 2023-05-23 DIAGNOSIS — D573 Sickle-cell trait: Secondary | ICD-10-CM | POA: Diagnosis present

## 2023-05-23 DIAGNOSIS — F17200 Nicotine dependence, unspecified, uncomplicated: Secondary | ICD-10-CM | POA: Diagnosis not present

## 2023-05-23 DIAGNOSIS — R1310 Dysphagia, unspecified: Secondary | ICD-10-CM | POA: Diagnosis present

## 2023-05-23 DIAGNOSIS — I110 Hypertensive heart disease with heart failure: Secondary | ICD-10-CM | POA: Diagnosis present

## 2023-05-23 DIAGNOSIS — Z7901 Long term (current) use of anticoagulants: Secondary | ICD-10-CM | POA: Diagnosis not present

## 2023-05-23 DIAGNOSIS — Z8249 Family history of ischemic heart disease and other diseases of the circulatory system: Secondary | ICD-10-CM | POA: Diagnosis not present

## 2023-05-23 DIAGNOSIS — Z716 Tobacco abuse counseling: Secondary | ICD-10-CM | POA: Diagnosis not present

## 2023-05-23 DIAGNOSIS — F17210 Nicotine dependence, cigarettes, uncomplicated: Secondary | ICD-10-CM | POA: Diagnosis present

## 2023-05-23 DIAGNOSIS — E890 Postprocedural hypothyroidism: Secondary | ICD-10-CM | POA: Diagnosis present

## 2023-05-23 LAB — BASIC METABOLIC PANEL
Anion gap: 7 (ref 5–15)
Anion gap: 6 (ref 5–15)
BUN: 10 mg/dL (ref 8–23)
BUN: 10 mg/dL (ref 8–23)
CO2: 25 mmol/L (ref 22–32)
CO2: 27 mmol/L (ref 22–32)
Calcium: 8.9 mg/dL (ref 8.9–10.3)
Calcium: 8.9 mg/dL (ref 8.9–10.3)
Chloride: 105 mmol/L (ref 98–111)
Chloride: 105 mmol/L (ref 98–111)
Creatinine, Ser: 0.79 mg/dL (ref 0.44–1.00)
Creatinine, Ser: 0.78 mg/dL (ref 0.44–1.00)
GFR, Estimated: >60 mL/min (ref >60)
GFR, Estimated: >60 mL/min (ref >60)
Glucose, Bld: 128 mg/dL — ABNORMAL HIGH (ref 70–99)
Glucose, Bld: 114 mg/dL — ABNORMAL HIGH (ref 70–99)
Potassium: 3.7 mmol/L (ref 3.5–5.1)
Potassium: 4.1 mmol/L (ref 3.5–5.1)
Sodium: 137 mmol/L (ref 135–145)
Sodium: 138 mmol/L (ref 135–145)

## 2023-05-23 LAB — CBC WITH DIFFERENTIAL/PLATELET
Abs Immature Granulocytes: 0.05 10*3/uL (ref 0.00–0.07)
Basophils Absolute: 0 10*3/uL (ref 0.0–0.1)
Basophils Relative: 0 %
Eosinophils Absolute: 0.1 10*3/uL (ref 0.0–0.5)
Eosinophils Relative: 1 %
HCT: 23.3 % — ABNORMAL LOW (ref 36.0–46.0)
Hemoglobin: 7.1 g/dL — ABNORMAL LOW (ref 12.0–15.0)
Immature Granulocytes: 1 %
Lymphocytes Relative: 8 %
Lymphs Abs: 0.8 10*3/uL (ref 0.7–4.0)
MCH: 21.4 pg — ABNORMAL LOW (ref 26.0–34.0)
MCHC: 30.5 g/dL (ref 30.0–36.0)
MCV: 70.2 fL — ABNORMAL LOW (ref 80.0–100.0)
Monocytes Absolute: 0.9 10*3/uL (ref 0.1–1.0)
Monocytes Relative: 8 %
Neutro Abs: 8.2 10*3/uL — ABNORMAL HIGH (ref 1.7–7.7)
Neutrophils Relative %: 82 %
Platelets: 398 10*3/uL (ref 150–400)
RBC: 3.32 MIL/uL — ABNORMAL LOW (ref 3.87–5.11)
RDW: 21.6 % — ABNORMAL HIGH (ref 11.5–15.5)
WBC: 10.1 10*3/uL (ref 4.0–10.5)
nRBC: 0 % (ref 0.0–0.2)

## 2023-05-23 LAB — CBC
HCT: 23.5 % — ABNORMAL LOW (ref 36.0–46.0)
Hemoglobin: 7.1 g/dL — ABNORMAL LOW (ref 12.0–15.0)
MCH: 21.2 pg — ABNORMAL LOW (ref 26.0–34.0)
MCHC: 30.2 g/dL (ref 30.0–36.0)
MCV: 70.1 fL — ABNORMAL LOW (ref 80.0–100.0)
Platelets: 402 10*3/uL — ABNORMAL HIGH (ref 150–400)
RBC: 3.35 MIL/uL — ABNORMAL LOW (ref 3.87–5.11)
RDW: 21.7 % — ABNORMAL HIGH (ref 11.5–15.5)
WBC: 10.6 10*3/uL — ABNORMAL HIGH (ref 4.0–10.5)
nRBC: 0 % (ref 0.0–0.2)

## 2023-05-23 LAB — PROTIME-INR
INR: 1.2 (ref 0.8–1.2)
Prothrombin Time: 14.9 s (ref 11.4–15.2)

## 2023-05-23 LAB — ABO/RH: ABO/RH(D): B POS

## 2023-05-23 LAB — MAGNESIUM: Magnesium: 1.6 mg/dL — ABNORMAL LOW (ref 1.7–2.4)

## 2023-05-23 MED ORDER — FENTANYL CITRATE PF 50 MCG/ML IJ SOSY
12.5000 ug | PREFILLED_SYRINGE | INTRAMUSCULAR | Status: DC | PRN
  Administered 2023-05-23 – 2023-05-24 (×3): 50 ug via INTRAVENOUS
  Filled 2023-05-23 (×3): qty 1

## 2023-05-23 MED ORDER — BUSPIRONE HCL 5 MG PO TABS
5.0000 mg | ORAL_TABLET | Freq: Three times a day (TID) | ORAL | Status: DC
  Administered 2023-05-23 – 2023-05-27 (×11): 5 mg via ORAL
  Filled 2023-05-23 (×12): qty 1

## 2023-05-23 MED ORDER — CYCLOSPORINE 0.05 % OP EMUL
1.0000 [drp] | Freq: Two times a day (BID) | OPHTHALMIC | Status: DC
  Administered 2023-05-23 – 2023-05-27 (×9): 1 [drp] via OPHTHALMIC
  Filled 2023-05-23 (×10): qty 30

## 2023-05-23 MED ORDER — OXYCODONE HCL 5 MG PO TABS
5.0000 mg | ORAL_TABLET | ORAL | Status: DC | PRN
  Administered 2023-05-23 – 2023-05-24 (×2): 5 mg via ORAL
  Filled 2023-05-23 (×2): qty 1

## 2023-05-23 MED ORDER — METOPROLOL TARTRATE 12.5 MG HALF TABLET
12.5000 mg | ORAL_TABLET | Freq: Two times a day (BID) | ORAL | Status: DC
  Administered 2023-05-23 – 2023-05-27 (×10): 12.5 mg via ORAL
  Filled 2023-05-23 (×10): qty 1

## 2023-05-23 MED ORDER — MELATONIN 3 MG PO TABS
3.0000 mg | ORAL_TABLET | Freq: Every evening | ORAL | Status: DC | PRN
  Administered 2023-05-23 – 2023-05-26 (×4): 3 mg via ORAL
  Filled 2023-05-23 (×4): qty 1

## 2023-05-23 MED ORDER — FENTANYL CITRATE (PF) 100 MCG/2ML IJ SOLN
INTRAMUSCULAR | Status: AC
  Administered 2023-05-23: 100 ug
  Filled 2023-05-23: qty 2

## 2023-05-23 MED ORDER — ATORVASTATIN CALCIUM 40 MG PO TABS
40.0000 mg | ORAL_TABLET | Freq: Every day | ORAL | Status: DC
  Administered 2023-05-23 – 2023-05-27 (×5): 40 mg via ORAL
  Filled 2023-05-23 (×5): qty 1

## 2023-05-23 MED ORDER — PANTOPRAZOLE SODIUM 40 MG PO TBEC
40.0000 mg | DELAYED_RELEASE_TABLET | Freq: Every day | ORAL | Status: DC
  Administered 2023-05-23 – 2023-05-27 (×5): 40 mg via ORAL
  Filled 2023-05-23 (×5): qty 1

## 2023-05-23 MED ORDER — ROPIVACAINE HCL 5 MG/ML IJ SOLN
INTRAMUSCULAR | Status: DC | PRN
  Administered 2023-05-23: 20 mL via PERINEURAL

## 2023-05-23 MED ORDER — NICOTINE 21 MG/24HR TD PT24
21.0000 mg | MEDICATED_PATCH | Freq: Every day | TRANSDERMAL | Status: DC
  Administered 2023-05-24 – 2023-05-27 (×4): 21 mg via TRANSDERMAL
  Filled 2023-05-23 (×4): qty 1

## 2023-05-23 MED ORDER — PROCHLORPERAZINE EDISYLATE 10 MG/2ML IJ SOLN: 5.0000 mg | Freq: Four times a day (QID) | INTRAMUSCULAR | Status: DC | PRN

## 2023-05-23 MED ORDER — SENNOSIDES-DOCUSATE SODIUM 8.6-50 MG PO TABS: 1.0000 | ORAL_TABLET | Freq: Every evening | ORAL | Status: DC | PRN

## 2023-05-23 MED ORDER — MAGNESIUM SULFATE 2 GM/50ML IV SOLN: 2.0000 g | Freq: Once | INTRAVENOUS | Status: DC

## 2023-05-23 MED ORDER — ALLOPURINOL 100 MG PO TABS
100.0000 mg | ORAL_TABLET | Freq: Every evening | ORAL | Status: DC
  Administered 2023-05-23 – 2023-05-27 (×5): 100 mg via ORAL
  Filled 2023-05-23 (×5): qty 1

## 2023-05-23 MED ORDER — FLUCONAZOLE 100 MG PO TABS
100.0000 mg | ORAL_TABLET | Freq: Every day | ORAL | Status: AC
  Administered 2023-05-23: 100 mg via ORAL
  Filled 2023-05-23: qty 1

## 2023-05-23 MED ORDER — TRIAMCINOLONE ACETONIDE 0.1 % MT PSTE
1.0000 | PASTE | Freq: Three times a day (TID) | OROMUCOSAL | Status: AC
  Administered 2023-05-23 – 2023-05-24 (×3): 1 via OROMUCOSAL
  Filled 2023-05-23: qty 5

## 2023-05-23 MED ORDER — SUCRALFATE 1 GM/10ML PO SUSP
1.0000 g | Freq: Three times a day (TID) | ORAL | Status: AC
  Administered 2023-05-23 – 2023-05-25 (×6): 1 g via ORAL
  Filled 2023-05-23 (×8): qty 10

## 2023-05-23 MED ORDER — OXYCODONE-ACETAMINOPHEN 5-325 MG PO TABS
1.0000 | ORAL_TABLET | Freq: Once | ORAL | Status: AC
  Administered 2023-05-23: 1 via ORAL
  Filled 2023-05-23: qty 1

## 2023-05-23 NOTE — Plan of Care (Signed)
 ?  Problem: Elimination: ?Goal: Will not experience complications related to urinary retention ?Outcome: Progressing ?  ?

## 2023-05-23 NOTE — Consult Note (Signed)
 [name]  ORTHOPAEDIC CONSULTATION  REQUESTING PHYSICIAN: Lue Elsie BROCKS, MD  ASSESSMENT AND PLAN: 83 y.o. female with the following: Right Hip Displaced femoral neck fracture  This patient requires inpatient admission to the hospitalist, to include preoperative clearance and perioperative medical management  - Weight Bearing Status/Activity: NWB Right lower extremity  - Additional recommended labs/tests:   -VTE Prophylaxis: Please hold Eliquis  prior to OR; to resume POD#1 at the discretion of the primary team  - Pain control: Recommend PO pain medications PRN; judicious use of narcotics  -Procedures: Plan for OR once patient has been medically optimized.   Plan for Right Hip hemiarthroplasty versus total hip arthroplasty.  Patient took her dose of Eliquis  at 9 PM last night.  Surgery planned for tomorrow     Chief Complaint: Right hip pain  HPI:  Holly Guerra is a 83 y.o. female with medical history significant for hypertension, hyperlipidemia, PAD, chronic HFmrEF, PAF on Eliquis , dementia, and intermittent mild solid dysphagia status post recent EGD with dilation who presents with right hip pain after a fall.   The patient's daughter is at the bedside and assists with the history.  Patient had apparently been doing fairly well at her SNF following hospital discharge on 05/19/2023.  She had had an uneventful day, took her evening medications, and was ambulating when she tripped and fell.  She was experiencing severe right hip and thigh pain and reports knowing immediately that she had broken something.  She denies any other appreciable injury.  She denies any recent shortness of breath, melena, hematochezia, or chest pain.  She never experiences chest pain with activities.  She took Eliquis  at approximately 9 PM.  Past Medical History:  Diagnosis Date   Abdominal bruit    Abnormal echocardiogram    Anemia    Aortic insufficiency    Aortic root dilation (HCC)    Aortic valve  disorder    Arthritis    all over (07/22/2012)   Arthropathy    Bilateral carotid artery stenosis    Carotid artery disease (HCC)    Chest discomfort    Edema    Exertional shortness of breath    Fatigue    GERD (gastroesophageal reflux disease)    Gout    Hypercholesteremia    Hypertension    Mitral regurgitation and aortic stenosis    Mixed hyperlipidemia    Murmur, cardiac    Peripheral vascular disease (HCC)    Pernicious anemia    PVD (peripheral vascular disease) (HCC)    Shortness of breath    Sickle-cell trait (HCC)    Thoracic aortic aneurysm (HCC)    Tobacco use    URI (upper respiratory infection)    Past Surgical History:  Procedure Laterality Date   ABDOMINAL AORTOGRAM W/LOWER EXTREMITY Bilateral 05/23/2020   Procedure: ABDOMINAL AORTOGRAM W/LOWER EXTREMITY;  Surgeon: Court Dorn JINNY, MD;  Location: [facility] INVASIVE CV [facility];  Service: Cardiovascular;  Laterality: Bilateral;   ABDOMINAL HYSTERECTOMY  1970's   APPENDECTOMY  1970's   CARDIOVASCULAR STRESS TEST  06/16/2012   normal myocardial perfusion imaging, LV systolic function was normal without regional wall motion abnormalities, LV EF 60%   DOPPLER ECHOCARDIOGRAPHY  06/16/2012   EF 60-65%, mild concetric LVH, moderate aortic regurg   ESOPHAGOGASTRODUODENOSCOPY (EGD) WITH PROPOFOL  N/A 05/15/2023   Procedure: ESOPHAGOGASTRODUODENOSCOPY (EGD) WITH PROPOFOL ;  Surgeon: Rollin Dover, MD;  Location: [facility] ENDOSCOPY;  Service: Gastroenterology;  Laterality: N/A;   LOWER EXTREMITY ANGIOGRAM N/A 07/22/2012   Procedure: LOWER EXTREMITY  ANGIOGRAM;  Surgeon: Dorn JINNY Lesches, MD;  Location: [facility] CATH LAB;  Service: Cardiovascular;  Laterality: N/A;   LOWER EXTREMITY ARTERIAL DOPPLER  08/08/2012   Left SFA stent-open and patent without evidence of hemodynamically significant stenosis   PERCUTANEOUS STENT INTERVENTION Left 07/22/2012   SAVORY DILATION N/A 05/15/2023   Procedure: SAVORY DILATION;  Surgeon: Rollin Dover, MD;  Location: [facility]  ENDOSCOPY;  Service: Gastroenterology;  Laterality: N/A;  16mm   TOTAL THYROIDECTOMY  ~ 2010   Social History   Socioeconomic History   Marital status: Widowed    Spouse name: Not on file   Number of children: 2   Years of education: Not on file   Highest education level: Not on file  Occupational History   Occupation: retired  Tobacco Use   Smoking status: Every Day    Current packs/day: 0.00    Average packs/day: 0.5 packs/day for 45.0 years (22.5 ttl pk-yrs)    Types: Cigarettes    Start date: 05/11/1962    Last attempt to quit: 05/12/2007    Years since quitting: 16.0   Smokeless tobacco: Never   Tobacco comments:    Pt states she smokes 6-7 a day  Vaping Use   Vaping status: Never Used  Substance and Sexual Activity   Alcohol use: Yes    Comment: 3 beers a week   Drug use: No   Sexual activity: Not on file  Other Topics Concern   Not on file  Social History Narrative   Not on file   Social Drivers of Health   Financial Resource Strain: Low Risk  (05/06/2023)   Overall Financial Resource Strain (CARDIA)    Difficulty of Paying Living Expenses: Not very hard  Food Insecurity: No Food Insecurity (05/23/2023)   Hunger Vital Sign    Worried About Running Out of Food in the Last Year: Never true    Ran Out of Food in the Last Year: Never true  Transportation Needs: No Transportation Needs (05/23/2023)   PRAPARE - Administrator, Civil Service (Medical): No    Lack of Transportation (Non-Medical): No  Physical Activity: Not on file  Stress: Not on file  Social Connections: Not on file   Family History  Problem Relation Age of Onset   Hypertension Mother    Heart disease Mother    Allergies  Allergen Reactions   Lactose Intolerance (Gi) Diarrhea   Prior to Admission medications   Medication Sig Start Date End Date Taking? Authorizing Provider  acetaminophen  (TYLENOL ) 500 MG tablet Take 1,000 mg by mouth 3 (three) times daily. May take 2 tablets  four times a day as needed   Yes [provider]  allopurinol  (ZYLOPRIM ) 100 MG tablet Take 1 tablet (100 mg total) by mouth every evening. 05/19/23  Yes Pahwani, Fredia, MD  apixaban  (ELIQUIS ) 2.5 MG TABS tablet Take 1 tablet (2.5 mg total) by mouth 2 (two) times daily. 05/19/23 06/18/23 Yes Pahwani, Fredia, MD  ascorbic acid (VITAMIN C) 500 MG tablet Take 500 mg by mouth daily.   Yes [provider]  atorvastatin  (LIPITOR ) 40 MG tablet Take 1 tablet (40 mg total) by mouth daily. 05/19/23 05/18/24 Yes Pahwani, Fredia, MD  busPIRone  (BUSPAR ) 5 MG tablet Take 1 tablet (5 mg total) by mouth 3 (three) times daily. 05/19/23 06/18/23 Yes Pahwani, Fredia, MD  clopidogrel  (PLAVIX ) 75 MG tablet Take 1 tablet (75 mg total) by mouth daily. 07/24/12  Yes Marcine Catalan M, PA-C  cyanocobalamin  (,VITAMIN B-12,)  1000 MCG/ML injection Inject 1,000 mg into the muscle every 30 (thirty) days. 03/08/20  Yes [provider]  cycloSPORINE  (RESTASIS ) 0.05 % ophthalmic emulsion Place 1 drop into both eyes 2 (two) times daily.   Yes [provider]  fluconazole  (DIFLUCAN ) 100 MG tablet Take 1 tablet (100 mg total) by mouth daily for 4 days. 05/20/23 05/24/23 Yes Pahwani, Fredia, MD  Hydrogen Peroxide 3 % MISC Take 10 mLs by mouth 3 (three) times daily as needed (Oral Rinse). Mix 10ml peroxide with 10ml water for an oral rinse. Swish and spit out   Yes [provider]  Iron-Vitamins (GERITOL) LIQD Take 15 mLs by mouth daily.   Yes [provider]  metoprolol  tartrate (LOPRESSOR ) 25 MG tablet Take 0.5 tablets (12.5 mg total) by mouth 2 (two) times daily. 05/19/23 06/18/23 Yes Pahwani, Fredia, MD  nicotine  (NICODERM CQ  - DOSED IN MG/24 HOURS) 21 mg/24hr patch Place 21 mg onto the skin daily.   Yes [provider]  omeprazole (PRILOSEC) 40 MG capsule Take 40 mg by mouth 2 (two) times daily.   Yes [provider]  sucralfate  (CARAFATE ) 1 GM/10ML suspension Take 10 mLs (1 g total) by mouth  every 8 (eight) hours for 6 days. Patient taking differently: Take 1 g by mouth every 8 (eight) hours. USE FOR 5 DAYS 05/19/23 05/25/23 Yes Vernon Fredia, MD  traMADol  (ULTRAM ) 50 MG tablet Take 1 tablet (50 mg total) by mouth every 8 (eight) hours as needed (pain). 05/17/23  Yes Vernon Fredia, MD  triamcinolone  (KENALOG ) 0.1 % paste Use as directed 1 Application in the mouth or throat 3 (three) times daily.   Yes [provider]    Family History Reviewed and non-contributory, no pertinent history of problems with bleeding or anesthesia    Review of Systems No fevers or chills No numbness or tingling No chest pain No shortness of breath No bowel or bladder dysfunction No GI distress No headaches    OBJECTIVE  Vitals:Patient Vitals for the past 8 hrs:  BP Temp Temp src Pulse Resp SpO2  05/23/23 0741 -- 98.6 F (37 C) Oral -- -- --  05/23/23 0545 (!) 158/97 -- -- 91 16 94 %  05/23/23 0327 -- 98.4 F (36.9 C) Oral -- -- --  05/23/23 0300 (!) 155/79 -- -- -- 17 --  05/23/23 0245 (!) 150/84 -- -- -- (!) 21 --   General: Alert, no acute distress Cardiovascular: Extremities are warm Respiratory: No cyanosis, no use of accessory musculature Skin: No lesions in the area of chief complaint  Neurologic: Sensation intact distally  Psychiatric: Patient is competent for consent with normal mood and affect Lymphatic: No swelling obvious and reported other than the area involved in the exam below Extremities  Right LE: Extremity held in a fixed position.  ROM deferred due to known fracture.  Sensation is intact distally in the sural, saphenous, DP, SP, and plantar nerve distribution. 2+ DP pulse.  Toes are WWP.  Active motion intact in the TA/EHL/GS. Left LE: Sensation is intact distally in the sural, saphenous, DP, SP, and plantar nerve distribution. 2+ DP pulse.  Toes are WWP.  Active motion intact in the TA/EHL/GS. Tolerates gentle ROM of the hip.  No pain with axial loading.      Test Results Imaging CT Head Wo Contrast Result Date: 05/23/2023 CLINICAL DATA:  Head trauma, minor (Age >= 65y); Neck trauma (Age >= 65y) EXAM: CT HEAD WITHOUT CONTRAST CT CERVICAL SPINE WITHOUT CONTRAST  TECHNIQUE: Multidetector CT imaging of the head and cervical spine was performed following the standard protocol without intravenous contrast. Multiplanar CT image reconstructions of the cervical spine were also generated. RADIATION DOSE REDUCTION: This exam was performed according to the departmental dose-optimization program which includes automated exposure control, adjustment of the mA and/or kV according to patient size and/or use of iterative reconstruction technique. COMPARISON:  Chest x-ray 05/22/2023 trauma CT head 05/08/2023 FINDINGS: CT HEAD FINDINGS Brain: Cerebral ventricle sizes are concordant with the degree of cerebral volume loss. Patchy and confluent areas of decreased attenuation are noted throughout the deep and periventricular white matter of the cerebral hemispheres bilaterally, compatible with chronic microvascular ischemic disease. no evidence of large-territorial acute infarction. No parenchymal hemorrhage. No mass lesion. No extra-axial collection. No mass effect or midline shift. No hydrocephalus. Basilar cisterns are patent. Vascular: No hyperdense vessel. Atherosclerotic calcifications are present within the cavernous internal carotid arteries. Skull: No acute fracture or focal lesion. Sinuses/Orbits: Paranasal sinuses and mastoid air cells are clear. Bilateral lens replacement. Otherwise the orbits are unremarkable. Other: Right temporomandibular joint degenerative changes. CT CERVICAL SPINE FINDINGS Alignment: Reversal of normal cervical lordosis centered at the C3-C4 level likely due to positioning and degenerative changes. Skull base and vertebrae: Multilevel moderate degenerative changes spine. No acute fracture. No aggressive appearing focal osseous lesion or focal  pathologic process. Soft tissues and spinal canal: No prevertebral fluid or swelling. No visible canal hematoma. Upper chest: Biapical interlobular septal wall thick. Emphysematous changes. Other: None. IMPRESSION: 1. No acute intracranial abnormality. 2. No acute displaced fracture or traumatic listhesis of the cervical spine. 3.  Emphysema (ICD10-J43.9). Electronically Signed   By: Morgane  Naveau M.D.   On: 05/23/2023 00:12   CT Cervical Spine Wo Contrast Result Date: 05/23/2023 CLINICAL DATA:  Head trauma, minor (Age >= 65y); Neck trauma (Age >= 65y) EXAM: CT HEAD WITHOUT CONTRAST CT CERVICAL SPINE WITHOUT CONTRAST TECHNIQUE: Multidetector CT imaging of the head and cervical spine was performed following the standard protocol without intravenous contrast. Multiplanar CT image reconstructions of the cervical spine were also generated. RADIATION DOSE REDUCTION: This exam was performed according to the departmental dose-optimization program which includes automated exposure control, adjustment of the mA and/or kV according to patient size and/or use of iterative reconstruction technique. COMPARISON:  Chest x-ray 05/22/2023 trauma CT head 05/08/2023 FINDINGS: CT HEAD FINDINGS Brain: Cerebral ventricle sizes are concordant with the degree of cerebral volume loss. Patchy and confluent areas of decreased attenuation are noted throughout the deep and periventricular white matter of the cerebral hemispheres bilaterally, compatible with chronic microvascular ischemic disease. no evidence of large-territorial acute infarction. No parenchymal hemorrhage. No mass lesion. No extra-axial collection. No mass effect or midline shift. No hydrocephalus. Basilar cisterns are patent. Vascular: No hyperdense vessel. Atherosclerotic calcifications are present within the cavernous internal carotid arteries. Skull: No acute fracture or focal lesion. Sinuses/Orbits: Paranasal sinuses and mastoid air cells are clear. Bilateral lens  replacement. Otherwise the orbits are unremarkable. Other: Right temporomandibular joint degenerative changes. CT CERVICAL SPINE FINDINGS Alignment: Reversal of normal cervical lordosis centered at the C3-C4 level likely due to positioning and degenerative changes. Skull base and vertebrae: Multilevel moderate degenerative changes spine. No acute fracture. No aggressive appearing focal osseous lesion or focal pathologic process. Soft tissues and spinal canal: No prevertebral fluid or swelling. No visible canal hematoma. Upper chest: Biapical interlobular septal wall thick. Emphysematous changes. Other: None. IMPRESSION: 1. No acute intracranial abnormality. 2. No acute displaced fracture or traumatic listhesis  of the cervical spine. 3.  Emphysema (ICD10-J43.9). Electronically Signed   By: Morgane  Naveau M.D.   On: 05/23/2023 00:12   DG Chest Port 1 View Result Date: 05/22/2023 CLINICAL DATA:  Hip fracture, preop. EXAM: PORTABLE CHEST 1 VIEW COMPARISON:  Chest CT 05/08/2023 FINDINGS: The heart is upper normal in size. Diffuse aortic atherosclerosis. The lungs are clear. Pulmonary vasculature is normal. No consolidation, pleural effusion, or pneumothorax. No acute osseous abnormalities are seen. IMPRESSION: No active disease. Electronically Signed   By: Andrea Gasman M.D.   On: 05/22/2023 23:29   DG Hip Unilat W or Wo Pelvis 2-3 Views Right Result Date: 05/22/2023 CLINICAL DATA:  Status post fall with hip pain. EXAM: DG HIP (WITH OR WITHOUT PELVIS) 2-3V RIGHT COMPARISON:  None Available. FINDINGS: Displaced right femoral neck fracture. Mild proximal migration of the femoral shaft. Femoral head remains seated. The bones are diffusely under mineralized. Intact bony pelvis including pubic rami. There is retained barium within multiple colonic diverticula. Vascular calcifications are seen. IMPRESSION: Displaced right femoral neck fracture. Electronically Signed   By: Andrea Gasman M.D.   On: 05/22/2023 23:28    Labs cbc Recent Labs    05/23/23 0015 05/23/23 0352  WBC 10.1 10.6*  HGB 7.1* 7.1*  HCT 23.3* 23.5*  PLT 398 402*    Labs inflam No results for input(s): CRP in the last 72 hours.  Invalid input(s): ESR  Labs coag Recent Labs    05/23/23 0015  INR 1.2    Recent Labs    05/23/23 0015 05/23/23 0352  NA 137 138  K 3.7 4.1  CL 105 105  CO2 25 27  GLUCOSE 128* 114*  BUN 10 10  CREATININE 0.79 0.78  CALCIUM  8.9 8.9

## 2023-05-23 NOTE — ED Notes (Signed)
Attempted iv x 2 with no success.

## 2023-05-23 NOTE — TOC CAGE-AID Note (Signed)
 Transition of Care [facility]) - CAGE-AID Screening  Patient Details  Name: Holly Guerra MRN: [id number] Date of Birth: 10-18-1940  Clinical Narrative:  Patient with baseline dementia, only oriented to person. Patient unable to fully participate in substance abuse screening at this time.  CAGE-AID Screening: Substance Abuse Screening unable to be completed due to: : Patient unable to participate

## 2023-05-23 NOTE — Anesthesia Procedure Notes (Signed)
 Anesthesia Regional Block: Femoral nerve block   Pre-Anesthetic Checklist: , timeout performed,  Correct Patient, Correct Site, Correct Laterality,  Correct Procedure,, site marked,  Risks and benefits discussed,  Surgical consent,  Pre-op evaluation,  At surgeon's request and post-op pain management  Laterality: Right  Prep: chloraprep       Needles:  Injection technique: Single-shot  Needle Type: Echogenic Stimulator Needle     Needle Length: 9cm  Needle Gauge: 21     Additional Needles:   Procedures:, nerve stimulator,,, ultrasound used (permanent image in chart), intact distal pulses,     Nerve Stimulator or Paresthesia:  Response: Quadriceps muscle contraction, 0.4 mA  Additional Responses:   Narrative:  Start time: 05/23/2023 3:00 PM End time: 05/23/2023 3:10 PM Injection made incrementally with aspirations every 5 mL.  Performed by: Personally  Anesthesiologist: Patrisha Bernardino SQUIBB, MD  Additional Notes: Functioning IV was confirmed and monitors were applied.  A 90mm 21ga Arrow echogenic stimulator needle was used. Sterile prep and drape,hand hygiene and sterile gloves were used.  Negative aspiration and negative test dose prior to incremental administration of local anesthetic. The patient tolerated the procedure well.

## 2023-05-23 NOTE — Progress Notes (Signed)
 SLP Cancellation Note  Patient Details Name: Holly Guerra MRN: [id number] DOB: 1940/12/26   Cancelled treatment:       Reason Eval/Treat Not Completed: Other (comment). Pt in ED awaiting ortho consult. Will f/u for swallow eval while admitted.   Hasset Chaviano, Consuelo Fitch 05/23/2023, 7:55 AM

## 2023-05-23 NOTE — ED Notes (Signed)
 Patient pulled all wires, brief, and IV out.

## 2023-05-23 NOTE — Anesthesia Preprocedure Evaluation (Addendum)
 Anesthesia Evaluation  Patient identified by MRN, date of birth, ID band Patient confused    Airway        Dental   Pulmonary Current Smoker   Pulmonary exam normal        Cardiovascular hypertension, Pt. on home beta blockers + Peripheral Vascular Disease and +CHF  Normal cardiovascular exam+ dysrhythmias      Neuro/Psych       Dementia    GI/Hepatic ,GERD  Medicated,,  Endo/Other    Renal/GU      Musculoskeletal  (+) Arthritis ,    Abdominal   Peds  Hematology  (+) Blood dyscrasia (Eliquis ), Sickle cell trait and anemia   Anesthesia Other Findings Displaced right femoral neck fracture  Reproductive/Obstetrics                              Anesthesia Physical Anesthesia Plan  ASA: 3  Anesthesia Plan: Regional   Post-op Pain Management:    Induction:   PONV Risk Score and Plan:   Airway Management Planned:   Additional Equipment:   Intra-op Plan:   Post-operative Plan:   Informed Consent:      Consent reviewed with POA  Plan Discussed with: Surgeon  Anesthesia Plan Comments:          Anesthesia Quick Evaluation

## 2023-05-23 NOTE — Anesthesia Postprocedure Evaluation (Signed)
 Anesthesia Post Note  Patient: Holly Guerra  Procedure(s) Performed: AN AD HOC NERVE BLOCK     Patient location during evaluation: PACU Anesthesia Type: Regional Level of consciousness: awake Pain management: pain level controlled Vital Signs Assessment: post-procedure vital signs reviewed and stable Respiratory status: spontaneous breathing, nonlabored ventilation and respiratory function stable Cardiovascular status: blood pressure returned to baseline and stable Postop Assessment: no apparent nausea or vomiting Anesthetic complications: no   No notable events documented.  Last Vitals:  Vitals:   05/23/23 1500 05/23/23 1520  BP: (!) 166/93 (!) 169/95  Pulse: 74 76  Resp: 16 16  Temp: 36.7 C   SpO2: 100% 100%    Last Pain:  Vitals:   05/23/23 0811  TempSrc: Oral  PainSc: 0-No pain                 Ezrah Dembeck P Elorah Dewing

## 2023-05-23 NOTE — Progress Notes (Addendum)
 PROGRESS NOTE    [facility]  FMW:[id number] DOB: 1941-03-09 DOA: 05/22/2023 PCP: Elvera Ozell SAUNDERS, PA-C   Brief Narrative:  Day 0 note, for complete history please see admit note from earlier this morning.  Briefly patient presents with right hip pain after a fall, found to have closed right hip fracture, orthopedic surgery following, plan for surgical intervention at 05/24/2023.  Advance diet today as tolerated, n.p.o. at midnight, preop labs and imaging per orthopedic surgery.   Assessment & Plan:   Principal Problem:   Closed right hip fracture, initial encounter [facility]) Active Problems:   Essential hypertension   PVD (peripheral vascular disease) (HCC)   Microcytic anemia   PAF (paroxysmal atrial fibrillation) (HCC)   Chronic heart failure with mildly reduced ejection fraction (HFmrEF, 41-49%) (HCC)   DVT prophylaxis: SCDs Start: 05/23/23 0206 Code Status:   Code Status: Limited: Do not attempt resuscitation (DNR) -DNR-LIMITED -Do Not Intubate/DNI  Family Communication: Daughter updated over the phone  Status is: Inpatient  Dispo: The patient is from: Home              Anticipated d/c is to: To be determined              Anticipated d/c date is: 48 to 72 hours              Patient currently not medically stable for discharge  Consultants:  Orthopedic surgery  Procedures:  Planned ORIF 05/24/2023  Antimicrobials:  Perioperatively  Subjective: Review of systems limited given patient's mental status, agitated this morning and poorly redirectable stating  you guys have got me glued down here, I cannot move, I need to go home so I can drive to work  Objective: Vitals:   05/23/23 0245 05/23/23 0300 05/23/23 0327 05/23/23 0545  BP: (!) 150/84 (!) 155/79  (!) 158/97  Pulse:    91  Resp: (!) 21 17  16   Temp:   98.4 F (36.9 C)   TempSrc:   Oral   SpO2:    94%  Weight:      Height:       No intake or output data in the 24 hours ending 05/23/23 0658 Filed Weights    05/22/23 2252  Weight: 40.4 kg    Examination:  General: In bed agitated, attempting to remove her covers HEENT:  Normocephalic atraumatic.  Sclerae nonicteric, noninjected.  Left eye lateral strabismus noted. Neck:  Without mass or deformity.  Trachea is midline. Lungs:  Clear to auscultate bilaterally without rhonchi, wheeze, or rales. Heart: Tachycardic without murmurs, rubs, or gallops. Abdomen:  Soft, nontender, nondistended.  Without guarding or rebound. Extremities: Without cyanosis, clubbing, edema, or obvious deformity. Skin:  Warm and dry, no erythema.  Data Reviewed: I have personally reviewed following labs and imaging studies  CBC: Recent Labs  Lab 05/17/23 0318 05/18/23 0459 05/23/23 0015 05/23/23 0352  WBC 5.3 5.4 10.1 10.6*  NEUTROABS  --   --  8.2*  --   HGB 8.0* 8.3* 7.1* 7.1*  HCT 26.7* 26.7* 23.3* 23.5*  MCV 67.6* 67.4* 70.2* 70.1*  PLT 217 252 398 402*   Basic Metabolic Panel: Recent Labs  Lab 05/19/23 0833 05/23/23 0015 05/23/23 0352  NA 137 137 138  K 4.3 3.7 4.1  CL 102 105 105  CO2 27 25 27   GLUCOSE 142* 128* 114*  BUN 16 10 10   CREATININE 0.85 0.79 0.78  CALCIUM  9.6 8.9 8.9  MG  --   --  1.6*   GFR: Estimated Creatinine Clearance: 35.2 mL/min (by C-G formula based on SCr of 0.78 mg/dL). Liver Function Tests: No results for input(s): AST, ALT, ALKPHOS, BILITOT, PROT, ALBUMIN in the last 168 hours. No results for input(s): LIPASE, AMYLASE in the last 168 hours. No results for input(s): AMMONIA in the last 168 hours. Coagulation Profile: Recent Labs  Lab 05/23/23 0015  INR 1.2   Cardiac Enzymes: No results for input(s): CKTOTAL, CKMB, CKMBINDEX, TROPONINI in the last 168 hours. BNP (last 3 results) No results for input(s): PROBNP in the last 8760 hours. HbA1C: No results for input(s): HGBA1C in the last 72 hours. CBG: No results for input(s): GLUCAP in the last 168 hours. Lipid Profile: No  results for input(s): CHOL, HDL, LDLCALC, TRIG, CHOLHDL, LDLDIRECT in the last 72 hours. Thyroid  Function Tests: No results for input(s): TSH, T4TOTAL, FREET4, T3FREE, THYROIDAB in the last 72 hours. Anemia Panel: No results for input(s): VITAMINB12, FOLATE, FERRITIN, TIBC, IRON, RETICCTPCT in the last 72 hours. Sepsis Labs: No results for input(s): PROCALCITON, LATICACIDVEN in the last 168 hours.  No results found for this or any previous visit (from the past 240 hours).       Radiology Studies: CT Head Wo Contrast Result Date: 05/23/2023 CLINICAL DATA:  Head trauma, minor (Age >= 65y); Neck trauma (Age >= 65y) EXAM: CT HEAD WITHOUT CONTRAST CT CERVICAL SPINE WITHOUT CONTRAST TECHNIQUE: Multidetector CT imaging of the head and cervical spine was performed following the standard protocol without intravenous contrast. Multiplanar CT image reconstructions of the cervical spine were also generated. RADIATION DOSE REDUCTION: This exam was performed according to the departmental dose-optimization program which includes automated exposure control, adjustment of the mA and/or kV according to patient size and/or use of iterative reconstruction technique. COMPARISON:  Chest x-ray 05/22/2023 trauma CT head 05/08/2023 FINDINGS: CT HEAD FINDINGS Brain: Cerebral ventricle sizes are concordant with the degree of cerebral volume loss. Patchy and confluent areas of decreased attenuation are noted throughout the deep and periventricular white matter of the cerebral hemispheres bilaterally, compatible with chronic microvascular ischemic disease. no evidence of large-territorial acute infarction. No parenchymal hemorrhage. No mass lesion. No extra-axial collection. No mass effect or midline shift. No hydrocephalus. Basilar cisterns are patent. Vascular: No hyperdense vessel. Atherosclerotic calcifications are present within the cavernous internal carotid arteries. Skull: No acute  fracture or focal lesion. Sinuses/Orbits: Paranasal sinuses and mastoid air cells are clear. Bilateral lens replacement. Otherwise the orbits are unremarkable. Other: Right temporomandibular joint degenerative changes. CT CERVICAL SPINE FINDINGS Alignment: Reversal of normal cervical lordosis centered at the C3-C4 level likely due to positioning and degenerative changes. Skull base and vertebrae: Multilevel moderate degenerative changes spine. No acute fracture. No aggressive appearing focal osseous lesion or focal pathologic process. Soft tissues and spinal canal: No prevertebral fluid or swelling. No visible canal hematoma. Upper chest: Biapical interlobular septal wall thick. Emphysematous changes. Other: None. IMPRESSION: 1. No acute intracranial abnormality. 2. No acute displaced fracture or traumatic listhesis of the cervical spine. 3.  Emphysema (ICD10-J43.9). Electronically Signed   By: Morgane  Naveau M.D.   On: 05/23/2023 00:12   CT Cervical Spine Wo Contrast Result Date: 05/23/2023 CLINICAL DATA:  Head trauma, minor (Age >= 65y); Neck trauma (Age >= 65y) EXAM: CT HEAD WITHOUT CONTRAST CT CERVICAL SPINE WITHOUT CONTRAST TECHNIQUE: Multidetector CT imaging of the head and cervical spine was performed following the standard protocol without intravenous contrast. Multiplanar CT image reconstructions of the cervical spine were also generated. RADIATION DOSE  REDUCTION: This exam was performed according to the departmental dose-optimization program which includes automated exposure control, adjustment of the mA and/or kV according to patient size and/or use of iterative reconstruction technique. COMPARISON:  Chest x-ray 05/22/2023 trauma CT head 05/08/2023 FINDINGS: CT HEAD FINDINGS Brain: Cerebral ventricle sizes are concordant with the degree of cerebral volume loss. Patchy and confluent areas of decreased attenuation are noted throughout the deep and periventricular white matter of the cerebral hemispheres  bilaterally, compatible with chronic microvascular ischemic disease. no evidence of large-territorial acute infarction. No parenchymal hemorrhage. No mass lesion. No extra-axial collection. No mass effect or midline shift. No hydrocephalus. Basilar cisterns are patent. Vascular: No hyperdense vessel. Atherosclerotic calcifications are present within the cavernous internal carotid arteries. Skull: No acute fracture or focal lesion. Sinuses/Orbits: Paranasal sinuses and mastoid air cells are clear. Bilateral lens replacement. Otherwise the orbits are unremarkable. Other: Right temporomandibular joint degenerative changes. CT CERVICAL SPINE FINDINGS Alignment: Reversal of normal cervical lordosis centered at the C3-C4 level likely due to positioning and degenerative changes. Skull base and vertebrae: Multilevel moderate degenerative changes spine. No acute fracture. No aggressive appearing focal osseous lesion or focal pathologic process. Soft tissues and spinal canal: No prevertebral fluid or swelling. No visible canal hematoma. Upper chest: Biapical interlobular septal wall thick. Emphysematous changes. Other: None. IMPRESSION: 1. No acute intracranial abnormality. 2. No acute displaced fracture or traumatic listhesis of the cervical spine. 3.  Emphysema (ICD10-J43.9). Electronically Signed   By: Morgane  Naveau M.D.   On: 05/23/2023 00:12   DG Chest Port 1 View Result Date: 05/22/2023 CLINICAL DATA:  Hip fracture, preop. EXAM: PORTABLE CHEST 1 VIEW COMPARISON:  Chest CT 05/08/2023 FINDINGS: The heart is upper normal in size. Diffuse aortic atherosclerosis. The lungs are clear. Pulmonary vasculature is normal. No consolidation, pleural effusion, or pneumothorax. No acute osseous abnormalities are seen. IMPRESSION: No active disease. Electronically Signed   By: Andrea Gasman M.D.   On: 05/22/2023 23:29   DG Hip Unilat W or Wo Pelvis 2-3 Views Right Result Date: 05/22/2023 CLINICAL DATA:  Status post fall with  hip pain. EXAM: DG HIP (WITH OR WITHOUT PELVIS) 2-3V RIGHT COMPARISON:  None Available. FINDINGS: Displaced right femoral neck fracture. Mild proximal migration of the femoral shaft. Femoral head remains seated. The bones are diffusely under mineralized. Intact bony pelvis including pubic rami. There is retained barium within multiple colonic diverticula. Vascular calcifications are seen. IMPRESSION: Displaced right femoral neck fracture. Electronically Signed   By: Andrea Gasman M.D.   On: 05/22/2023 23:28    Scheduled Meds:  allopurinol   100 mg Oral QPM   atorvastatin   40 mg Oral Daily   busPIRone   5 mg Oral TID   cycloSPORINE   1 drop Both Eyes BID   fluconazole   100 mg Oral Daily   metoprolol  tartrate  12.5 mg Oral BID   nicotine   21 mg Transdermal Daily   pantoprazole   40 mg Oral Daily   sucralfate   1 g Oral TID with meals   triamcinolone   1 Application Mouth/Throat TID   Continuous Infusions:   LOS: 0 days   Time spent:   Elsie JAYSON [place], DO [facility]  If 7PM-7AM, please contact night-coverage [email or web address]  05/23/2023, 6:58 AM

## 2023-05-23 NOTE — H&P (Signed)
 History and Physical    Holly Guerra [facility] FMW:[id number] DOB: [birth year] DOA: 05/22/2023  PCP: Elvera Ozell SAUNDERS, PA-C   Patient coming from: SNF  Chief Complaint: Fall, right hip pain   HPI: Holly Guerra is a 83 y.o. female with medical history significant for hypertension, hyperlipidemia, PAD, chronic HFmrEF, PAF on Eliquis , dementia, and intermittent mild solid dysphagia status post recent EGD with dilation who presents with right hip pain after a fall.  The patient's daughter is at the bedside and assists with the history.  Patient had apparently been doing fairly well at her SNF following hospital discharge on 05/19/2023.  She had had an uneventful day, took her evening medications, and was ambulating when she tripped and fell.  She was experiencing severe right hip and thigh pain and reports knowing immediately that she had broken something.  She denies any other appreciable injury.  She denies any recent shortness of breath, melena, hematochezia, or chest pain.  She never experiences chest pain with activities.  She took Eliquis  at approximately 9 PM.  ED Course: Upon arrival to the ED, patient is found to be afebrile and saturating well on room air with normal heart rate and stable blood pressure.  Labs are most notable for hemoglobin 7.1.  Plain radiographs of the hip demonstrate displaced right femoral neck fracture.  Orthopedic surgery (Dr. Sherida) was consulted by the ED physician, type and screen was performed, and the patient was given morphine  and Percocet.  Review of Systems:  All other systems reviewed and apart from HPI, are negative.  Past Medical History:  Diagnosis Date   Abdominal bruit    Abnormal echocardiogram    Anemia    Aortic insufficiency    Aortic root dilation (HCC)    Aortic valve disorder    Arthritis    all over (07/22/2012)   Arthropathy    Bilateral carotid artery stenosis    Carotid artery disease (HCC)    Chest discomfort    Edema    Exertional  shortness of breath    Fatigue    GERD (gastroesophageal reflux disease)    Gout    Hypercholesteremia    Hypertension    Mitral regurgitation and aortic stenosis    Mixed hyperlipidemia    Murmur, cardiac    Peripheral vascular disease (HCC)    Pernicious anemia    PVD (peripheral vascular disease) (HCC)    Shortness of breath    Sickle-cell trait (HCC)    Thoracic aortic aneurysm (HCC)    Tobacco use    URI (upper respiratory infection)     Past Surgical History:  Procedure Laterality Date   ABDOMINAL AORTOGRAM W/LOWER EXTREMITY Bilateral 05/23/2020   Procedure: ABDOMINAL AORTOGRAM W/LOWER EXTREMITY;  Surgeon: Court Dorn PARAS, MD;  Location: [facility] INVASIVE CV LAB;  Service: Cardiovascular;  Laterality: Bilateral;   ABDOMINAL HYSTERECTOMY  1970's   APPENDECTOMY  1970's   CARDIOVASCULAR STRESS TEST  06/16/2012   normal myocardial perfusion imaging, LV systolic function was normal without regional wall motion abnormalities, LV EF 60%   DOPPLER ECHOCARDIOGRAPHY  06/16/2012   EF 60-65%, mild concetric LVH, moderate aortic regurg   ESOPHAGOGASTRODUODENOSCOPY (EGD) WITH PROPOFOL  N/A 05/15/2023   Procedure: ESOPHAGOGASTRODUODENOSCOPY (EGD) WITH PROPOFOL ;  Surgeon: Rollin Dover, MD;  Location: [facility] ENDOSCOPY;  Service: Gastroenterology;  Laterality: N/A;   LOWER EXTREMITY ANGIOGRAM N/A 07/22/2012   Procedure: LOWER EXTREMITY ANGIOGRAM;  Surgeon: Dorn PARAS Court, MD;  Location: [facility] CATH LAB;  Service: Cardiovascular;  Laterality: N/A;  LOWER EXTREMITY ARTERIAL DOPPLER  08/08/2012   Left SFA stent-open and patent without evidence of hemodynamically significant stenosis   PERCUTANEOUS STENT INTERVENTION Left 07/22/2012   SAVORY DILATION N/A 05/15/2023   Procedure: SAVORY DILATION;  Surgeon: Rollin Dover, MD;  Location: [facility] ENDOSCOPY;  Service: Gastroenterology;  Laterality: N/A;  16mm   TOTAL THYROIDECTOMY  ~ 2010    Social History:   reports that she has been smoking cigarettes. She started  smoking about 61 years ago. She has a 22.5 pack-year smoking history. She has never used smokeless tobacco. She reports current alcohol use. She reports that she does not use drugs.  Allergies  Allergen Reactions   Lactose Intolerance (Gi) Diarrhea    Family History  Problem Relation Age of Onset   Hypertension Mother    Heart disease Mother      Prior to Admission medications   Medication Sig Start Date End Date Taking? Authorizing Provider  acetaminophen  (TYLENOL ) 500 MG tablet Take 1,000 mg by mouth every 6 (six) hours as needed for mild pain (pain score 1-3) or moderate pain (pain score 4-6).    [provider]  allopurinol  (ZYLOPRIM ) 100 MG tablet Take 1 tablet (100 mg total) by mouth every evening. 05/19/23   Vernon Ranks, MD  apixaban  (ELIQUIS ) 2.5 MG TABS tablet Take 1 tablet (2.5 mg total) by mouth 2 (two) times daily. 05/19/23 06/18/23  Vernon Ranks, MD  atorvastatin  (LIPITOR ) 40 MG tablet Take 1 tablet (40 mg total) by mouth daily. 05/19/23 05/18/24  Vernon Ranks, MD  busPIRone  (BUSPAR ) 5 MG tablet Take 1 tablet (5 mg total) by mouth 3 (three) times daily. 05/19/23 06/18/23  Vernon Ranks, MD  clopidogrel  (PLAVIX ) 75 MG tablet Take 1 tablet (75 mg total) by mouth daily. 07/24/12   Marcine Catalan M, PA-C  cyanocobalamin  (,VITAMIN B-12,) 1000 MCG/ML injection Inject 1,000 mg into the muscle every 30 (thirty) days. 03/08/20   [provider]  cycloSPORINE  (RESTASIS ) 0.05 % ophthalmic emulsion Place 1 drop into both eyes 2 (two) times daily.    [provider]  fluconazole  (DIFLUCAN ) 100 MG tablet Take 1 tablet (100 mg total) by mouth daily for 4 days. 05/20/23 05/24/23  Vernon Ranks, MD  Iron-Vitamins (GERITOL) LIQD Take 15 mLs by mouth daily.    [provider]  metoprolol  tartrate (LOPRESSOR ) 25 MG tablet Take 0.5 tablets (12.5 mg total) by mouth 2 (two) times daily. 05/19/23 06/18/23  Vernon Ranks, MD  omeprazole (PRILOSEC) 40 MG capsule Take 40 mg by  mouth 2 (two) times daily.    [provider]  sucralfate  (CARAFATE ) 1 GM/10ML suspension Take 10 mLs (1 g total) by mouth every 8 (eight) hours for 6 days. 05/19/23 05/25/23  Vernon Ranks, MD  traMADol  (ULTRAM ) 50 MG tablet Take 1 tablet (50 mg total) by mouth every 8 (eight) hours as needed (pain). 05/17/23   Vernon Ranks, MD  triamcinolone  (KENALOG ) 0.1 % paste Use as directed in the mouth or throat 3 (three) times daily for 5 days. 05/19/23 05/24/23  Vernon Ranks, MD    Physical Exam: Vitals:   05/22/23 2250 05/22/23 2252 05/23/23 0045  BP: (!) 155/90  (!) 149/85  Pulse: 94  93  Resp: (!) 25  (!) 25  Temp: 98.7 F (37.1 C)    TempSrc: Oral    SpO2: 93%  100%  Weight:  40.4 kg   Height:  5' 1 (1.549 m)     Constitutional: NAD, no pallor or diaphoresis  Eyes: PERTLA, lids and conjunctivae normal ENMT: Mucous membranes are moist. Posterior pharynx clear of any exudate or lesions.   Neck: supple, no masses  Respiratory: no wheezing, no crackles. No accessory muscle use.  Cardiovascular: S1 & S2 heard, regular rate and rhythm. No extremity edema.  Abdomen: No distension, no tenderness, soft. Bowel sounds active.  Musculoskeletal: no clubbing / cyanosis. Right hip tender, neurovascularly intact.   Skin: no significant rashes, lesions, ulcers. Warm, dry, well-perfused. Neurologic: CN 2-12 grossly intact. Moving all extremities. Alert and oriented to person, place, and situation.  Psychiatric: Restless. Cooperative.    Labs and Imaging on Admission: I have personally reviewed following labs and imaging studies  CBC: Recent Labs  Lab 05/16/23 0358 05/17/23 0318 05/18/23 0459 05/23/23 0015  WBC 5.6 5.3 5.4 10.1  NEUTROABS  --   --   --  8.2*  HGB 8.3* 8.0* 8.3* 7.1*  HCT 26.8* 26.7* 26.7* 23.3*  MCV 67.3* 67.6* 67.4* 70.2*  PLT 235 217 252 398   Basic Metabolic Panel: Recent Labs  Lab 05/19/23 0833 05/23/23 0015  NA 137 137  K 4.3 3.7  CL 102 105  CO2 27 25   GLUCOSE 142* 128*  BUN 16 10  CREATININE 0.85 0.79  CALCIUM  9.6 8.9   GFR: Estimated Creatinine Clearance: 35.2 mL/min (by C-G formula based on SCr of 0.79 mg/dL). Liver Function Tests: No results for input(s): AST, ALT, ALKPHOS, BILITOT, PROT, ALBUMIN in the last 168 hours. No results for input(s): LIPASE, AMYLASE in the last 168 hours. No results for input(s): AMMONIA in the last 168 hours. Coagulation Profile: Recent Labs  Lab 05/23/23 0015  INR 1.2   Cardiac Enzymes: No results for input(s): CKTOTAL, CKMB, CKMBINDEX, TROPONINI in the last 168 hours. BNP (last 3 results) No results for input(s): PROBNP in the last 8760 hours. HbA1C: No results for input(s): HGBA1C in the last 72 hours. CBG: No results for input(s): GLUCAP in the last 168 hours. Lipid Profile: No results for input(s): CHOL, HDL, LDLCALC, TRIG, CHOLHDL, LDLDIRECT in the last 72 hours. Thyroid  Function Tests: No results for input(s): TSH, T4TOTAL, FREET4, T3FREE, THYROIDAB in the last 72 hours. Anemia Panel: No results for input(s): VITAMINB12, FOLATE, FERRITIN, TIBC, IRON, RETICCTPCT in the last 72 hours. Urine analysis:    Component Value Date/Time   COLORURINE YELLOW 05/07/2023 1847   APPEARANCEUR CLEAR 05/07/2023 1847   LABSPEC 1.013 05/07/2023 1847   PHURINE 6.0 05/07/2023 1847   GLUCOSEU 50 (A) 05/07/2023 1847   HGBUR SMALL (A) 05/07/2023 1847   BILIRUBINUR NEGATIVE 05/07/2023 1847   KETONESUR 5 (A) 05/07/2023 1847   PROTEINUR NEGATIVE 05/07/2023 1847   NITRITE NEGATIVE 05/07/2023 1847   LEUKOCYTESUR SMALL (A) 05/07/2023 1847   Sepsis Labs: @LABRCNTIP (procalcitonin:4,lacticidven:4) )No results found for this or any previous visit (from the past 240 hours).   Radiological Exams on Admission: CT Head Wo Contrast Result Date: 05/23/2023 CLINICAL DATA:  Head trauma, minor (Age >= 65y); Neck trauma (Age >= 65y) EXAM: CT HEAD  WITHOUT CONTRAST CT CERVICAL SPINE WITHOUT CONTRAST TECHNIQUE: Multidetector CT imaging of the head and cervical spine was performed following the standard protocol without intravenous contrast. Multiplanar CT image reconstructions of the cervical spine were also generated. RADIATION DOSE REDUCTION: This exam was performed according to the departmental dose-optimization program which includes automated exposure control, adjustment of the mA and/or kV according to patient size and/or use of iterative reconstruction technique. COMPARISON:  Chest x-ray 05/22/2023 trauma CT head 05/08/2023 FINDINGS: CT  HEAD FINDINGS Brain: Cerebral ventricle sizes are concordant with the degree of cerebral volume loss. Patchy and confluent areas of decreased attenuation are noted throughout the deep and periventricular white matter of the cerebral hemispheres bilaterally, compatible with chronic microvascular ischemic disease. no evidence of large-territorial acute infarction. No parenchymal hemorrhage. No mass lesion. No extra-axial collection. No mass effect or midline shift. No hydrocephalus. Basilar cisterns are patent. Vascular: No hyperdense vessel. Atherosclerotic calcifications are present within the cavernous internal carotid arteries. Skull: No acute fracture or focal lesion. Sinuses/Orbits: Paranasal sinuses and mastoid air cells are clear. Bilateral lens replacement. Otherwise the orbits are unremarkable. Other: Right temporomandibular joint degenerative changes. CT CERVICAL SPINE FINDINGS Alignment: Reversal of normal cervical lordosis centered at the C3-C4 level likely due to positioning and degenerative changes. Skull base and vertebrae: Multilevel moderate degenerative changes spine. No acute fracture. No aggressive appearing focal osseous lesion or focal pathologic process. Soft tissues and spinal canal: No prevertebral fluid or swelling. No visible canal hematoma. Upper chest: Biapical interlobular septal wall thick.  Emphysematous changes. Other: None. IMPRESSION: 1. No acute intracranial abnormality. 2. No acute displaced fracture or traumatic listhesis of the cervical spine. 3.  Emphysema (ICD10-J43.9). Electronically Signed   By: Morgane  Naveau M.D.   On: 05/23/2023 00:12   CT Cervical Spine Wo Contrast Result Date: 05/23/2023 CLINICAL DATA:  Head trauma, minor (Age >= 65y); Neck trauma (Age >= 65y) EXAM: CT HEAD WITHOUT CONTRAST CT CERVICAL SPINE WITHOUT CONTRAST TECHNIQUE: Multidetector CT imaging of the head and cervical spine was performed following the standard protocol without intravenous contrast. Multiplanar CT image reconstructions of the cervical spine were also generated. RADIATION DOSE REDUCTION: This exam was performed according to the departmental dose-optimization program which includes automated exposure control, adjustment of the mA and/or kV according to patient size and/or use of iterative reconstruction technique. COMPARISON:  Chest x-ray 05/22/2023 trauma CT head 05/08/2023 FINDINGS: CT HEAD FINDINGS Brain: Cerebral ventricle sizes are concordant with the degree of cerebral volume loss. Patchy and confluent areas of decreased attenuation are noted throughout the deep and periventricular white matter of the cerebral hemispheres bilaterally, compatible with chronic microvascular ischemic disease. no evidence of large-territorial acute infarction. No parenchymal hemorrhage. No mass lesion. No extra-axial collection. No mass effect or midline shift. No hydrocephalus. Basilar cisterns are patent. Vascular: No hyperdense vessel. Atherosclerotic calcifications are present within the cavernous internal carotid arteries. Skull: No acute fracture or focal lesion. Sinuses/Orbits: Paranasal sinuses and mastoid air cells are clear. Bilateral lens replacement. Otherwise the orbits are unremarkable. Other: Right temporomandibular joint degenerative changes. CT CERVICAL SPINE FINDINGS Alignment: Reversal of normal  cervical lordosis centered at the C3-C4 level likely due to positioning and degenerative changes. Skull base and vertebrae: Multilevel moderate degenerative changes spine. No acute fracture. No aggressive appearing focal osseous lesion or focal pathologic process. Soft tissues and spinal canal: No prevertebral fluid or swelling. No visible canal hematoma. Upper chest: Biapical interlobular septal wall thick. Emphysematous changes. Other: None. IMPRESSION: 1. No acute intracranial abnormality. 2. No acute displaced fracture or traumatic listhesis of the cervical spine. 3.  Emphysema (ICD10-J43.9). Electronically Signed   By: Morgane  Naveau M.D.   On: 05/23/2023 00:12   DG Chest Port 1 View Result Date: 05/22/2023 CLINICAL DATA:  Hip fracture, preop. EXAM: PORTABLE CHEST 1 VIEW COMPARISON:  Chest CT 05/08/2023 FINDINGS: The heart is upper normal in size. Diffuse aortic atherosclerosis. The lungs are clear. Pulmonary vasculature is normal. No consolidation, pleural effusion, or pneumothorax. No acute  osseous abnormalities are seen. IMPRESSION: No active disease. Electronically Signed   By: Andrea Gasman M.D.   On: 05/22/2023 23:29   DG Hip Unilat W or Wo Pelvis 2-3 Views Right Result Date: 05/22/2023 CLINICAL DATA:  Status post fall with hip pain. EXAM: DG HIP (WITH OR WITHOUT PELVIS) 2-3V RIGHT COMPARISON:  None Available. FINDINGS: Displaced right femoral neck fracture. Mild proximal migration of the femoral shaft. Femoral head remains seated. The bones are diffusely under mineralized. Intact bony pelvis including pubic rami. There is retained barium within multiple colonic diverticula. Vascular calcifications are seen. IMPRESSION: Displaced right femoral neck fracture. Electronically Signed   By: Andrea Gasman M.D.   On: 05/22/2023 23:28    EKG: Independently reviewed. Sinus rhythm, PAC, LVH with secondary repolarization abnormality.   Assessment/Plan   1. Right hip fracture  - Based on the  available data, there is no new/worsening cardiac disease, no ACS/PCI/CABG in past year, Revised Cardiac Risk Index is 1, and no further preoperative cardiac testing is indicated - CBC will be repeated and she will likely need RBC transfusion prior to surgery; she otherwise appears optimized for surgery  - Continue pain-control and supportive care, hold Eliquis  and Plavix     2. PAF  - Continue metoprolol , hold Eliquis  (last dose was at 21:00 on 05/22/23)  3. Chronic HFmrEF  - Appears compensated  - Continue beta-blocker, monitor volume status   4. Anemia  - Hx of IDA and pernicious anemia managed with oral iron and B12 injections    - Hgb is 7.1 on admission with no overt bleeding  - Repeat CBC in am, will likely need transfusion prior to surgery   5. PAD  - No acute ischemia  - Continue Lipitor , hold Plavix    6. Dementia  - Delirium precautions    DVT prophylaxis: SCDs  Code Status: DNR  Level of Care: Level of care: Med-Surg Family Communication: Daughter at bedside  Disposition Plan:  Patient is from: SNF  Anticipated d/c is to: SNF  Anticipated d/c date is: 05/27/23  Patient currently: Pending orthopedic surgery consultation and likely operative hip repair  Consults called: Orthopedic surgery  Admission status: Inpatient     Evalene GORMAN Sprinkles, MD [facility]  05/23/2023, 2:08 AM

## 2023-05-24 ENCOUNTER — Inpatient Hospital Stay (HOSPITAL_COMMUNITY): Payer: Medicare Other

## 2023-05-24 DIAGNOSIS — S72001A Fracture of unspecified part of neck of right femur, initial encounter for closed fracture: Secondary | ICD-10-CM | POA: Diagnosis not present

## 2023-05-24 LAB — BASIC METABOLIC PANEL
Anion gap: 11 (ref 5–15)
BUN: 9 mg/dL (ref 8–23)
CO2: 24 mmol/L (ref 22–32)
Calcium: 9 mg/dL (ref 8.9–10.3)
Chloride: 100 mmol/L (ref 98–111)
Creatinine, Ser: 0.79 mg/dL (ref 0.44–1.00)
GFR, Estimated: >60 mL/min (ref >60)
Glucose, Bld: 122 mg/dL — ABNORMAL HIGH (ref 70–99)
Potassium: 3.5 mmol/L (ref 3.5–5.1)
Sodium: 135 mmol/L (ref 135–145)

## 2023-05-24 LAB — CBC
HCT: 24.4 % — ABNORMAL LOW (ref 36.0–46.0)
Hemoglobin: 7.7 g/dL — ABNORMAL LOW (ref 12.0–15.0)
MCH: 21.3 pg — ABNORMAL LOW (ref 26.0–34.0)
MCHC: 31.6 g/dL (ref 30.0–36.0)
MCV: 67.4 fL — ABNORMAL LOW (ref 80.0–100.0)
Platelets: 441 10*3/uL — ABNORMAL HIGH (ref 150–400)
RBC: 3.62 MIL/uL — ABNORMAL LOW (ref 3.87–5.11)
RDW: 22.5 % — ABNORMAL HIGH (ref 11.5–15.5)
WBC: 9.9 10*3/uL (ref 4.0–10.5)
nRBC: 0 % (ref 0.0–0.2)

## 2023-05-24 LAB — SURGICAL PCR SCREEN
MRSA, PCR: NEGATIVE
Staphylococcus aureus: NEGATIVE

## 2023-05-24 MED ORDER — QUETIAPINE FUMARATE 25 MG PO TABS
25.0000 mg | ORAL_TABLET | Freq: Every day | ORAL | Status: DC
  Administered 2023-05-24 – 2023-05-27 (×4): 25 mg via ORAL
  Filled 2023-05-24 (×4): qty 1

## 2023-05-24 MED ORDER — BOOST PLUS PO LIQD
237.0000 mL | Freq: Every day | ORAL | Status: DC
  Administered 2023-05-24: 237 mL via ORAL
  Filled 2023-05-24 (×4): qty 237

## 2023-05-24 MED ORDER — ENSURE ENLIVE PO LIQD
237.0000 mL | Freq: Two times a day (BID) | ORAL | Status: DC
  Administered 2023-05-26 – 2023-05-27 (×4): 237 mL via ORAL

## 2023-05-24 MED ORDER — ADULT MULTIVITAMIN W/MINERALS CH
1.0000 | ORAL_TABLET | Freq: Every day | ORAL | Status: DC
  Administered 2023-05-24 – 2023-05-27 (×3): 1 via ORAL
  Filled 2023-05-24 (×3): qty 1

## 2023-05-24 NOTE — Plan of Care (Signed)
  Problem: Education: Goal: Knowledge of General Education information will improve Description: Including pain rating scale, medication(s)/side effects and non-pharmacologic comfort measures Outcome: Progressing   Problem: Pain Management: Goal: General experience of comfort will improve Outcome: Progressing   Problem: Safety: Goal: Ability to remain free from injury will improve Outcome: Progressing

## 2023-05-24 NOTE — Consult Note (Signed)
 Reason for Consult:Right hip fx Referring Physician: Arvella Mon Time called: 0730 Time at bedside: 0905   Holly Guerra is an 83 y.o. female.  HPI: Holly Guerra fell at the [facility] currently [facility]. She had immediate right hip pain and could not get up. She was brought to the ED where x-rays showed a right hip fx and orthopedic surgery was consulted. She is demented and cannot reliably contribute to history. Due to her needing hip hemi which was outside of his scope of practice the initial consulting orthopedist requested orthopedic trauma consultation.  Past Medical History:  Diagnosis Date   Abdominal bruit    Abnormal echocardiogram    Anemia    Aortic insufficiency    Aortic root dilation (HCC)    Aortic valve disorder    Arthritis    all over (07/22/2012)   Arthropathy    Bilateral carotid artery stenosis    Carotid artery disease (HCC)    Chest discomfort    Edema    Exertional shortness of breath    Fatigue    GERD (gastroesophageal reflux disease)    Gout    Hypercholesteremia    Hypertension    Mitral regurgitation and aortic stenosis    Mixed hyperlipidemia    Murmur, cardiac    Peripheral vascular disease (HCC)    Pernicious anemia    PVD (peripheral vascular disease) (HCC)    Shortness of breath    Sickle-cell trait (HCC)    Thoracic aortic aneurysm (HCC)    Tobacco use    URI (upper respiratory infection)     Past Surgical History:  Procedure Laterality Date   ABDOMINAL AORTOGRAM W/LOWER EXTREMITY Bilateral 05/23/2020   Procedure: ABDOMINAL AORTOGRAM W/LOWER EXTREMITY;  Surgeon: Court Dorn JINNY, MD;  Location: [facility];  Service: Cardiovascular;  Laterality: Bilateral;   ABDOMINAL HYSTERECTOMY  1970's   APPENDECTOMY  1970's   CARDIOVASCULAR STRESS TEST  06/16/2012   normal myocardial perfusion imaging, LV systolic function was normal without regional wall motion abnormalities, LV EF 60%   DOPPLER ECHOCARDIOGRAPHY  06/16/2012   EF 60-65%, mild  concetric LVH, moderate aortic regurg   ESOPHAGOGASTRODUODENOSCOPY (EGD) WITH PROPOFOL  N/A 05/15/2023   Procedure: ESOPHAGOGASTRODUODENOSCOPY (EGD) WITH PROPOFOL ;  Surgeon: Rollin Dover, MD;  Location: [facility] ENDOSCOPY;  Service: Gastroenterology;  Laterality: N/A;   LOWER EXTREMITY ANGIOGRAM N/A 07/22/2012   Procedure: LOWER EXTREMITY ANGIOGRAM;  Surgeon: Dorn JINNY Court, MD;  Location: [facility] CATH LAB;  Service: Cardiovascular;  Laterality: N/A;   LOWER EXTREMITY ARTERIAL DOPPLER  08/08/2012   Left SFA stent-open and patent without evidence of hemodynamically significant stenosis   PERCUTANEOUS STENT INTERVENTION Left 07/22/2012   SAVORY DILATION N/A 05/15/2023   Procedure: SAVORY DILATION;  Surgeon: Rollin Dover, MD;  Location: [facility] ENDOSCOPY;  Service: Gastroenterology;  Laterality: N/A;  16mm   TOTAL THYROIDECTOMY  ~ 2010    Family History  Problem Relation Age of Onset   Hypertension Mother    Heart disease Mother     Social History:  reports that she has been smoking cigarettes. She started smoking about 61 years ago. She has a 22.5 pack-year smoking history. She has never used smokeless tobacco. She reports current alcohol use. She reports that she does not use drugs.  Allergies:  Allergies  Allergen Reactions   Lactose Intolerance (Gi) Diarrhea    Medications: I have reviewed the patient's current medications.  Results for orders placed or performed during the hospital encounter of 05/22/23 (from the past 48  hours)  Type and screen [facility]     Status: None   Collection Time: 05/23/23 12:05 AM  Result Value Ref Range   ABO/RH(D) B POS    Antibody Screen NEG    Sample Expiration      05/26/2023,2359 Performed at [facility], [address], [place], [place] [address]   Basic metabolic panel     Status: Abnormal   Collection Time: 05/23/23 12:15 AM  Result Value Ref Range   Sodium 137 135 - 145 mmol/L   Potassium 3.7 3.5 - 5.1 mmol/L   Chloride 105 98 -  111 mmol/L   CO2 25 22 - 32 mmol/L   Glucose, Bld 128 (H) 70 - 99 mg/dL    Comment: Glucose reference range applies only to samples taken after fasting for at least 8 hours.   BUN 10 8 - 23 mg/dL   Creatinine, Ser 9.20 0.44 - 1.00 mg/dL   Calcium  8.9 8.9 - 10.3 mg/dL   GFR, Estimated >39 >39 mL/min    Comment: (NOTE) Calculated using the CKD-EPI Creatinine Equation (2021)    Anion gap 7 5 - 15    Comment: Performed at [facility], [address], [place], [place] [address]  CBC with Differential     Status: Abnormal   Collection Time: 05/23/23 12:15 AM  Result Value Ref Range   WBC 10.1 4.0 - 10.5 K/uL   RBC 3.32 (L) 3.87 - 5.11 MIL/uL   Hemoglobin 7.1 (L) 12.0 - 15.0 g/dL    Comment: Reticulocyte Hemoglobin testing may be clinically indicated, consider ordering this additional test [id number]    HCT 23.3 (L) 36.0 - 46.0 %   MCV 70.2 (L) 80.0 - 100.0 fL   MCH 21.4 (L) 26.0 - 34.0 pg   MCHC 30.5 30.0 - 36.0 g/dL   RDW 78.3 (H) 88.4 - 84.4 %   Platelets 398 150 - 400 K/uL    Comment: REPEATED TO VERIFY   nRBC 0.0 0.0 - 0.2 %   Neutrophils Relative % 82 %   Neutro Abs 8.2 (H) 1.7 - 7.7 K/uL   Lymphocytes Relative 8 %   Lymphs Abs 0.8 0.7 - 4.0 K/uL   Monocytes Relative 8 %   Monocytes Absolute 0.9 0.1 - 1.0 K/uL   Eosinophils Relative 1 %   Eosinophils Absolute 0.1 0.0 - 0.5 K/uL   Basophils Relative 0 %   Basophils Absolute 0.0 0.0 - 0.1 K/uL   WBC Morphology MORPHOLOGY UNREMARKABLE    RBC Morphology See Note    Smear Review MORPHOLOGY UNREMARKABLE    Immature Granulocytes 1 %   Abs Immature Granulocytes 0.05 0.00 - 0.07 K/uL   Target Cells PRESENT     Comment: Performed at [facility], [address], [place], [place] [address]  Protime-INR     Status: None   Collection Time: 05/23/23 12:15 AM  Result Value Ref Range   Prothrombin Time 14.9 11.4 - 15.2 seconds   INR 1.2 0.8 - 1.2    Comment: (NOTE) INR goal varies based on device and disease  states. Performed at [facility], [address], [place], [place] [address]   ABO/Rh     Status: None   Collection Time: 05/23/23 12:15 AM  Result Value Ref Range   ABO/RH(D)      B POS Performed at [facility], [address] [place], [place] [address]   CBC  Status: Abnormal   Collection Time: 05/23/23  3:52 AM  Result Value Ref Range   WBC 10.6 (H) 4.0 - 10.5 K/uL   RBC 3.35 (L) 3.87 - 5.11 MIL/uL   Hemoglobin 7.1 (L) 12.0 - 15.0 g/dL    Comment: Reticulocyte Hemoglobin testing may be clinically indicated, consider ordering this additional test [id number]    HCT 23.5 (L) 36.0 - 46.0 %   MCV 70.1 (L) 80.0 - 100.0 fL   MCH 21.2 (L) 26.0 - 34.0 pg   MCHC 30.2 30.0 - 36.0 g/dL   RDW 78.2 (H) 88.4 - 84.4 %   Platelets 402 (H) 150 - 400 K/uL   nRBC 0.0 0.0 - 0.2 %    Comment: Performed at [facility] Lab, [address], [place], [place] [address]  Basic metabolic panel     Status: Abnormal   Collection Time: 05/23/23  3:52 AM  Result Value Ref Range   Sodium 138 135 - 145 mmol/L   Potassium 4.1 3.5 - 5.1 mmol/L   Chloride 105 98 - 111 mmol/L   CO2 27 22 - 32 mmol/L   Glucose, Bld 114 (H) 70 - 99 mg/dL    Comment: Glucose reference range applies only to samples taken after fasting for at least 8 hours.   BUN 10 8 - 23 mg/dL   Creatinine, Ser 9.21 0.44 - 1.00 mg/dL   Calcium  8.9 8.9 - 10.3 mg/dL   GFR, Estimated >39 >39 mL/min    Comment: (NOTE) Calculated using the CKD-EPI Creatinine Equation (2021)    Anion gap 6 5 - 15    Comment: Performed at [facility], [address], [place], [place] [address]  Magnesium      Status: Abnormal   Collection Time: 05/23/23  3:52 AM  Result Value Ref Range   Magnesium  1.6 (L) 1.7 - 2.4 mg/dL    Comment: Performed at [facility], [address] [place], [place] [address]  CBC     Status: Abnormal   Collection Time: 05/24/23  4:43 AM  Result Value Ref Range   WBC 9.9 4.0 - 10.5 K/uL   RBC 3.62  (L) 3.87 - 5.11 MIL/uL   Hemoglobin 7.7 (L) 12.0 - 15.0 g/dL    Comment: Reticulocyte Hemoglobin testing may be clinically indicated, consider ordering this additional test [id number]    HCT 24.4 (L) 36.0 - 46.0 %   MCV 67.4 (L) 80.0 - 100.0 fL   MCH 21.3 (L) 26.0 - 34.0 pg   MCHC 31.6 30.0 - 36.0 g/dL   RDW 77.4 (H) 88.4 - 84.4 %   Platelets 441 (H) 150 - 400 K/uL    Comment: REPEATED TO VERIFY   nRBC 0.0 0.0 - 0.2 %    Comment: Performed at [facility], [address] [place], [place] [address]  Basic metabolic panel     Status: Abnormal   Collection Time: 05/24/23  4:43 AM  Result Value Ref Range   Sodium 135 135 - 145 mmol/L   Potassium 3.5 3.5 - 5.1 mmol/L   Chloride 100 98 - 111 mmol/L   CO2 24 22 - 32 mmol/L   Glucose, Bld 122 (H) 70 - 99 mg/dL    Comment: Glucose reference range applies only to samples taken after fasting for at least 8 hours.   BUN 9 8 - 23 mg/dL   Creatinine, Ser 9.20 0.44 - 1.00 mg/dL   Calcium  9.0 8.9 - 10.3 mg/dL  GFR, Estimated >60 >60 mL/min    Comment: (NOTE) Calculated using the CKD-EPI Creatinine Equation (2021)    Anion gap 11 5 - 15    Comment: Performed at [facility], [address] [place], [place] [address]    CT Head Wo Contrast Result Date: 05/23/2023 CLINICAL DATA:  Head trauma, minor (Age >= 65y); Neck trauma (Age >= 65y) EXAM: CT HEAD WITHOUT CONTRAST CT CERVICAL SPINE WITHOUT CONTRAST TECHNIQUE: Multidetector CT imaging of the head and cervical spine was performed following the standard protocol without intravenous contrast. Multiplanar CT image reconstructions of the cervical spine were also generated. RADIATION DOSE REDUCTION: This exam was performed according to the departmental dose-optimization program which includes automated exposure control, adjustment of the mA and/or kV according to patient size and/or use of iterative reconstruction technique. COMPARISON:  Chest x-ray 05/22/2023 trauma CT head 05/08/2023  FINDINGS: CT HEAD FINDINGS Brain: Cerebral ventricle sizes are concordant with the degree of cerebral volume loss. Patchy and confluent areas of decreased attenuation are noted throughout the deep and periventricular white matter of the cerebral hemispheres bilaterally, compatible with chronic microvascular ischemic disease. no evidence of large-territorial acute infarction. No parenchymal hemorrhage. No mass lesion. No extra-axial collection. No mass effect or midline shift. No hydrocephalus. Basilar cisterns are patent. Vascular: No hyperdense vessel. Atherosclerotic calcifications are present within the cavernous internal carotid arteries. Skull: No acute fracture or focal lesion. Sinuses/Orbits: Paranasal sinuses and mastoid air cells are clear. Bilateral lens replacement. Otherwise the orbits are unremarkable. Other: Right temporomandibular joint degenerative changes. CT CERVICAL SPINE FINDINGS Alignment: Reversal of normal cervical lordosis centered at the C3-C4 level likely due to positioning and degenerative changes. Skull base and vertebrae: Multilevel moderate degenerative changes spine. No acute fracture. No aggressive appearing focal osseous lesion or focal pathologic process. Soft tissues and spinal canal: No prevertebral fluid or swelling. No visible canal hematoma. Upper chest: Biapical interlobular septal wall thick. Emphysematous changes. Other: None. IMPRESSION: 1. No acute intracranial abnormality. 2. No acute displaced fracture or traumatic listhesis of the cervical spine. 3.  Emphysema (ICD10-J43.9). Electronically Signed   By: Morgane  Naveau M.D.   On: 05/23/2023 00:12   CT Cervical Spine Wo Contrast Result Date: 05/23/2023 CLINICAL DATA:  Head trauma, minor (Age >= 65y); Neck trauma (Age >= 65y) EXAM: CT HEAD WITHOUT CONTRAST CT CERVICAL SPINE WITHOUT CONTRAST TECHNIQUE: Multidetector CT imaging of the head and cervical spine was performed following the standard protocol without  intravenous contrast. Multiplanar CT image reconstructions of the cervical spine were also generated. RADIATION DOSE REDUCTION: This exam was performed according to the departmental dose-optimization program which includes automated exposure control, adjustment of the mA and/or kV according to patient size and/or use of iterative reconstruction technique. COMPARISON:  Chest x-ray 05/22/2023 trauma CT head 05/08/2023 FINDINGS: CT HEAD FINDINGS Brain: Cerebral ventricle sizes are concordant with the degree of cerebral volume loss. Patchy and confluent areas of decreased attenuation are noted throughout the deep and periventricular white matter of the cerebral hemispheres bilaterally, compatible with chronic microvascular ischemic disease. no evidence of large-territorial acute infarction. No parenchymal hemorrhage. No mass lesion. No extra-axial collection. No mass effect or midline shift. No hydrocephalus. Basilar cisterns are patent. Vascular: No hyperdense vessel. Atherosclerotic calcifications are present within the cavernous internal carotid arteries. Skull: No acute fracture or focal lesion. Sinuses/Orbits: Paranasal sinuses and mastoid air cells are clear. Bilateral lens replacement. Otherwise the orbits are unremarkable. Other: Right temporomandibular joint degenerative changes. CT CERVICAL SPINE FINDINGS Alignment: Reversal  of normal cervical lordosis centered at the C3-C4 level likely due to positioning and degenerative changes. Skull base and vertebrae: Multilevel moderate degenerative changes spine. No acute fracture. No aggressive appearing focal osseous lesion or focal pathologic process. Soft tissues and spinal canal: No prevertebral fluid or swelling. No visible canal hematoma. Upper chest: Biapical interlobular septal wall thick. Emphysematous changes. Other: None. IMPRESSION: 1. No acute intracranial abnormality. 2. No acute displaced fracture or traumatic listhesis of the cervical spine. 3.   Emphysema (ICD10-J43.9). Electronically Signed   By: Morgane  Naveau M.D.   On: 05/23/2023 00:12   DG Chest Port 1 View Result Date: 05/22/2023 CLINICAL DATA:  Hip fracture, preop. EXAM: PORTABLE CHEST 1 VIEW COMPARISON:  Chest CT 05/08/2023 FINDINGS: The heart is upper normal in size. Diffuse aortic atherosclerosis. The lungs are clear. Pulmonary vasculature is normal. No consolidation, pleural effusion, or pneumothorax. No acute osseous abnormalities are seen. IMPRESSION: No active disease. Electronically Signed   By: Andrea Gasman M.D.   On: 05/22/2023 23:29   DG Hip Unilat W or Wo Pelvis 2-3 Views Right Result Date: 05/22/2023 CLINICAL DATA:  Status post fall with hip pain. EXAM: DG HIP (WITH OR WITHOUT PELVIS) 2-3V RIGHT COMPARISON:  None Available. FINDINGS: Displaced right femoral neck fracture. Mild proximal migration of the femoral shaft. Femoral head remains seated. The bones are diffusely under mineralized. Intact bony pelvis including pubic rami. There is retained barium within multiple colonic diverticula. Vascular calcifications are seen. IMPRESSION: Displaced right femoral neck fracture. Electronically Signed   By: Andrea Gasman M.D.   On: 05/22/2023 23:28    Review of Systems  Unable to perform ROS: Dementia   Blood pressure (!) 158/91, pulse 100, temperature 98 F (36.7 C), temperature source Oral, resp. rate 18, height 5' 1 (1.549 m), weight 40.4 kg, SpO2 95%. Physical Exam Constitutional:      General: She is not in acute distress.    Appearance: She is well-developed. She is not diaphoretic.  HENT:     Head: Normocephalic and atraumatic.  Eyes:     General: No scleral icterus.       Right eye: No discharge.        Left eye: No discharge.     Conjunctiva/sclera: Conjunctivae normal.  Cardiovascular:     Rate and Rhythm: Normal rate and regular rhythm.  Pulmonary:     Effort: Pulmonary effort is normal. No respiratory distress.  Musculoskeletal:     Cervical  back: Normal range of motion.     Comments: RLE No traumatic wounds, ecchymosis, or rash  Mild TTP hip  No knee or ankle effusion  Knee stable to varus/ valgus and anterior/posterior stress  Sens DPN, SPN, TN intact  Motor EHL, ext, flex, evers 5/5  DP 2+, PT 2+, No significant edema  Skin:    General: Skin is warm and dry.  Neurological:     Mental Status: She is alert.  Psychiatric:        Mood and Affect: Mood normal.        Behavior: Behavior normal.     Assessment/Plan: Right hip fx -- Plan hip hemi tomorrow with Dr. Celena. Please keep NPO after MN. Multiple medical problems including hypertension, hyperlipidemia, PAD, chronic HFmrEF, PAF on Eliquis , dementia, and intermittent mild solid dysphagia status post recent EGD with dilation -- per primary service. Please continue to hold Eliquis .    Ozell DOROTHA Ned, PA-C Orthopedic Surgery [phone] 05/24/2023, 9:24 AM

## 2023-05-24 NOTE — Progress Notes (Signed)
 PROGRESS NOTE    Holly Guerra [facility]  FMW:[id number] DOB: 1940-07-21 DOA: 05/22/2023 PCP: Elvera Ozell SAUNDERS, PA-C   Brief Narrative:  Day 0 note, for complete history please see admit note from earlier this morning.  Briefly patient presents with right hip pain after a fall, found to have closed right hip fracture, orthopedic surgery following, plan for surgical intervention at 05/24/2023. Advance diet today as tolerated, n.p.o. at midnight, preop labs and imaging per orthopedic surgery.  Assessment & Plan:   Principal Problem:   Closed right hip fracture, initial encounter [facility]) Active Problems:   Essential hypertension   PVD (peripheral vascular disease) (HCC)   Microcytic anemia   PAF (paroxysmal atrial fibrillation) (HCC)   Chronic heart failure with mildly reduced ejection fraction (HFmrEF, 41-49%) (HCC)  Right hip fracture  - Based on the available data, there is no new/worsening cardiac disease, no ACS/PCI/CABG in past year, Revised Cardiac Risk Index is 1, and no further preoperative cardiac testing is indicated - CBC will be repeated and she will likely need RBC transfusion prior to surgery; she otherwise appears optimized for surgery  - Continue pain-control and supportive care, hold Eliquis  and Plavix      PAF, rate controlled - Continue metoprolol , hold Eliquis  (last dose was at 21:00 on 05/22/23)   Chronic HFrEF  - Appears compensated  - Continue beta-blocker, monitor volume status    Iron deficiency/Pernicious anemia  - Managed with oral iron and B12 injections    - Hgb is low but stable  - Defer to surgery for need for transfusion (Hgb 7.7 today)    PAD  - No acute ischemia  - Continue Lipitor , hold Plavix     Dementia  Rule out hospital delirium - Delirium precautions - Start seroquel  tonight - low dose 25mg  - increase as necessary   DVT prophylaxis: SCDs Start: 05/23/23 0206 Code Status:   Code Status: Limited: Do not attempt resuscitation (DNR) -DNR-LIMITED  -Do Not Intubate/DNI  Family Communication: Daughter updated over the phone  Status is: Inpatient  Dispo: The patient is from: Home              Anticipated d/c is to: To be determined              Anticipated d/c date is: 48 to 72 hours              Patient currently not medically stable for discharge  Consultants:  Orthopedic surgery  Procedures:  Planned ORIF 05/24/2023  Antimicrobials:  Perioperatively  Subjective: Review of systems limited given patient's mental status, agitated this morning and poorly redirectable stating  you guys have got me glued down here, I cannot move, I need to go home so I can drive to work  Objective: Vitals:   05/23/23 1520 05/23/23 1538 05/23/23 1931 05/24/23 0542  BP: (!) 169/95 (!) 159/80 (!) 159/79 (!) 174/91  Pulse: 76 (!) 103 99 (!) 101  Resp: 16 18 17 20   Temp:  99.9 F (37.7 C) 98.9 F (37.2 C) 97.6 F (36.4 C)  TempSrc:  Oral Oral Oral  SpO2: 100% 95% 94% 94%  Weight:      Height:        Intake/Output Summary (Last 24 hours) at 05/24/2023 0708 Last data filed at 05/24/2023 9385 Gross per 24 hour  Intake 0 ml  Output 300 ml  Net -300 ml   Filed Weights   05/22/23 2252  Weight: 40.4 kg    Examination:  General: In bed  agitated, attempting to remove her covers HEENT:  Normocephalic atraumatic.  Sclerae nonicteric, noninjected.  Left eye lateral strabismus noted. Neck:  Without mass or deformity.  Trachea is midline. Lungs:  Clear to auscultate bilaterally without rhonchi, wheeze, or rales. Heart: Tachycardic without murmurs, rubs, or gallops. Abdomen:  Soft, nontender, nondistended.  Without guarding or rebound. Extremities: Without cyanosis, clubbing, edema, or obvious deformity. Skin:  Warm and dry, no erythema.  Data Reviewed: I have personally reviewed following labs and imaging studies  CBC: Recent Labs  Lab 05/18/23 0459 05/23/23 0015 05/23/23 0352 05/24/23 0443  WBC 5.4 10.1 10.6* 9.9  NEUTROABS  --   8.2*  --   --   HGB 8.3* 7.1* 7.1* 7.7*  HCT 26.7* 23.3* 23.5* 24.4*  MCV 67.4* 70.2* 70.1* 67.4*  PLT 252 398 402* 441*   Basic Metabolic Panel: Recent Labs  Lab 05/19/23 0833 05/23/23 0015 05/23/23 0352 05/24/23 0443  NA 137 137 138 135  K 4.3 3.7 4.1 3.5  CL 102 105 105 100  CO2 27 25 27 24   GLUCOSE 142* 128* 114* 122*  BUN 16 10 10 9   CREATININE 0.85 0.79 0.78 0.79  CALCIUM  9.6 8.9 8.9 9.0  MG  --   --  1.6*  --    GFR: Estimated Creatinine Clearance: 35.2 mL/min (by C-G formula based on SCr of 0.79 mg/dL). Liver Function Tests: No results for input(s): AST, ALT, ALKPHOS, BILITOT, PROT, ALBUMIN in the last 168 hours. No results for input(s): LIPASE, AMYLASE in the last 168 hours. No results for input(s): AMMONIA in the last 168 hours. Coagulation Profile: Recent Labs  Lab 05/23/23 0015  INR 1.2   Cardiac Enzymes: No results for input(s): CKTOTAL, CKMB, CKMBINDEX, TROPONINI in the last 168 hours. BNP (last 3 results) No results for input(s): PROBNP in the last 8760 hours. HbA1C: No results for input(s): HGBA1C in the last 72 hours. CBG: No results for input(s): GLUCAP in the last 168 hours. Lipid Profile: No results for input(s): CHOL, HDL, LDLCALC, TRIG, CHOLHDL, LDLDIRECT in the last 72 hours. Thyroid  Function Tests: No results for input(s): TSH, T4TOTAL, FREET4, T3FREE, THYROIDAB in the last 72 hours. Anemia Panel: No results for input(s): VITAMINB12, FOLATE, FERRITIN, TIBC, IRON, RETICCTPCT in the last 72 hours. Sepsis Labs: No results for input(s): PROCALCITON, LATICACIDVEN in the last 168 hours.  No results found for this or any previous visit (from the past 240 hours).       Radiology Studies: CT Head Wo Contrast Result Date: 05/23/2023 CLINICAL DATA:  Head trauma, minor (Age >= 65y); Neck trauma (Age >= 65y) EXAM: CT HEAD WITHOUT CONTRAST CT CERVICAL SPINE WITHOUT CONTRAST  TECHNIQUE: Multidetector CT imaging of the head and cervical spine was performed following the standard protocol without intravenous contrast. Multiplanar CT image reconstructions of the cervical spine were also generated. RADIATION DOSE REDUCTION: This exam was performed according to the departmental dose-optimization program which includes automated exposure control, adjustment of the mA and/or kV according to patient size and/or use of iterative reconstruction technique. COMPARISON:  Chest x-ray 05/22/2023 trauma CT head 05/08/2023 FINDINGS: CT HEAD FINDINGS Brain: Cerebral ventricle sizes are concordant with the degree of cerebral volume loss. Patchy and confluent areas of decreased attenuation are noted throughout the deep and periventricular white matter of the cerebral hemispheres bilaterally, compatible with chronic microvascular ischemic disease. no evidence of large-territorial acute infarction. No parenchymal hemorrhage. No mass lesion. No extra-axial collection. No mass effect or midline shift. No hydrocephalus. Basilar cisterns  are patent. Vascular: No hyperdense vessel. Atherosclerotic calcifications are present within the cavernous internal carotid arteries. Skull: No acute fracture or focal lesion. Sinuses/Orbits: Paranasal sinuses and mastoid air cells are clear. Bilateral lens replacement. Otherwise the orbits are unremarkable. Other: Right temporomandibular joint degenerative changes. CT CERVICAL SPINE FINDINGS Alignment: Reversal of normal cervical lordosis centered at the C3-C4 level likely due to positioning and degenerative changes. Skull base and vertebrae: Multilevel moderate degenerative changes spine. No acute fracture. No aggressive appearing focal osseous lesion or focal pathologic process. Soft tissues and spinal canal: No prevertebral fluid or swelling. No visible canal hematoma. Upper chest: Biapical interlobular septal wall thick. Emphysematous changes. Other: None. IMPRESSION: 1. No  acute intracranial abnormality. 2. No acute displaced fracture or traumatic listhesis of the cervical spine. 3.  Emphysema (ICD10-J43.9). Electronically Signed   By: Morgane  Naveau M.D.   On: 05/23/2023 00:12   CT Cervical Spine Wo Contrast Result Date: 05/23/2023 CLINICAL DATA:  Head trauma, minor (Age >= 65y); Neck trauma (Age >= 65y) EXAM: CT HEAD WITHOUT CONTRAST CT CERVICAL SPINE WITHOUT CONTRAST TECHNIQUE: Multidetector CT imaging of the head and cervical spine was performed following the standard protocol without intravenous contrast. Multiplanar CT image reconstructions of the cervical spine were also generated. RADIATION DOSE REDUCTION: This exam was performed according to the departmental dose-optimization program which includes automated exposure control, adjustment of the mA and/or kV according to patient size and/or use of iterative reconstruction technique. COMPARISON:  Chest x-ray 05/22/2023 trauma CT head 05/08/2023 FINDINGS: CT HEAD FINDINGS Brain: Cerebral ventricle sizes are concordant with the degree of cerebral volume loss. Patchy and confluent areas of decreased attenuation are noted throughout the deep and periventricular white matter of the cerebral hemispheres bilaterally, compatible with chronic microvascular ischemic disease. no evidence of large-territorial acute infarction. No parenchymal hemorrhage. No mass lesion. No extra-axial collection. No mass effect or midline shift. No hydrocephalus. Basilar cisterns are patent. Vascular: No hyperdense vessel. Atherosclerotic calcifications are present within the cavernous internal carotid arteries. Skull: No acute fracture or focal lesion. Sinuses/Orbits: Paranasal sinuses and mastoid air cells are clear. Bilateral lens replacement. Otherwise the orbits are unremarkable. Other: Right temporomandibular joint degenerative changes. CT CERVICAL SPINE FINDINGS Alignment: Reversal of normal cervical lordosis centered at the C3-C4 level likely due  to positioning and degenerative changes. Skull base and vertebrae: Multilevel moderate degenerative changes spine. No acute fracture. No aggressive appearing focal osseous lesion or focal pathologic process. Soft tissues and spinal canal: No prevertebral fluid or swelling. No visible canal hematoma. Upper chest: Biapical interlobular septal wall thick. Emphysematous changes. Other: None. IMPRESSION: 1. No acute intracranial abnormality. 2. No acute displaced fracture or traumatic listhesis of the cervical spine. 3.  Emphysema (ICD10-J43.9). Electronically Signed   By: Morgane  Naveau M.D.   On: 05/23/2023 00:12   DG Chest Port 1 View Result Date: 05/22/2023 CLINICAL DATA:  Hip fracture, preop. EXAM: PORTABLE CHEST 1 VIEW COMPARISON:  Chest CT 05/08/2023 FINDINGS: The heart is upper normal in size. Diffuse aortic atherosclerosis. The lungs are clear. Pulmonary vasculature is normal. No consolidation, pleural effusion, or pneumothorax. No acute osseous abnormalities are seen. IMPRESSION: No active disease. Electronically Signed   By: Andrea Gasman M.D.   On: 05/22/2023 23:29   DG Hip Unilat W or Wo Pelvis 2-3 Views Right Result Date: 05/22/2023 CLINICAL DATA:  Status post fall with hip pain. EXAM: DG HIP (WITH OR WITHOUT PELVIS) 2-3V RIGHT COMPARISON:  None Available. FINDINGS: Displaced right femoral neck fracture. Mild proximal  migration of the femoral shaft. Femoral head remains seated. The bones are diffusely under mineralized. Intact bony pelvis including pubic rami. There is retained barium within multiple colonic diverticula. Vascular calcifications are seen. IMPRESSION: Displaced right femoral neck fracture. Electronically Signed   By: Andrea Gasman M.D.   On: 05/22/2023 23:28    Scheduled Meds:  allopurinol   100 mg Oral QPM   atorvastatin   40 mg Oral Daily   busPIRone   5 mg Oral TID   cycloSPORINE   1 drop Both Eyes BID   metoprolol  tartrate  12.5 mg Oral BID   nicotine   21 mg Transdermal  Daily   pantoprazole   40 mg Oral Daily   sucralfate   1 g Oral TID with meals   Continuous Infusions:  magnesium  sulfate bolus IVPB Stopped (05/23/23 0731)     LOS: 1 day   Time spent:   Elsie JAYSON [place], DO [facility]  If 7PM-7AM, please contact night-coverage [email or web address]  05/24/2023, 7:08 AM

## 2023-05-24 NOTE — Progress Notes (Signed)
 SLP Cancellation Note  Patient Details Name: Holly Guerra MRN: [id number] DOB: 1940-08-26   Cancelled treatment:       Reason Eval/Treat Not Completed: Patient not medically ready. Per chart, pt NPO for surgery anticipated to be today. Will continue to follow for ability to complete swallow eval while inpatient.     Leita SAILOR., M.A. CCC-SLP Acute Rehabilitation Services Office [phone]  Secure chat preferred  05/24/2023, 7:45 AM

## 2023-05-24 NOTE — Progress Notes (Signed)
 Initial Nutrition Assessment  DOCUMENTATION CODES:   Severe malnutrition in context of chronic illness  INTERVENTION:   -Continue regular diet with encouragement.  -Provide Ensure Enlive (lactose free) po BID, each supplement provides 350 kcal and 20 grams of protein.  -Provide Boost Plus (lactose free) 237 mL PO daily, each supplement provides 360 calories and 14 gm protein.  -MVI/Minerals-1 Tab daily.  -Consider appetite stimulant.   NUTRITION DIAGNOSIS:   Severe Malnutrition related to acute illness, chronic illness, poor appetite as evidenced by energy intake < or equal to 50% for > or equal to 1 month, severe fat depletion, severe muscle depletion, percent weight loss.    GOAL:   Patient will meet greater than or equal to 90% of their needs    MONITOR:   PO intake, Labs, Supplement acceptance, Weight trends, Skin  REASON FOR ASSESSMENT:   Consult Assessment of nutrition requirement/status  ASSESSMENT:  83 y/o female presented after a fall and right hip pain.  Admitted with closed right hip fracture.  PMH: HTN, HLD, PAD, chronic heart failure, PAF, dementia, intermittent mild solid dysphagia status S/P recent EGD with dilation, abdominal bruit, anemia, aortic insufficiency, aortic root dilation, aortic valve disorder, bilateral carotid artery stenosis, CAD, chest discomfort, edema, exertional shortness of breath, fatigue, GERD, gout, hypercholesterolemia, PVD, sickle cell trait, thoracic aortic aneurysm, tobacco use, percutaneous stent intervention, smoker.  Nutrition hx provided by patient and family at bedside.  She has had reduced intakes for the past year, eating half as much as usual with noted poor appetite. Note recent intakes have improve some since having the esophageal dilation and they anticipate continued improvement.  She is willing to take Ensure in vanilla flavor.  Today she did not have any B, not hungry. Patient denies nausea, taste alterations,  constipation. White patches observed on tongue. Requested to RN to check her oral cavity for thrush.  Per review of EMR, weight loss of 11% x 6 months.Edema non-pitting RLE, LLE.  Medications reviewed and include PPI, carafate , magnesium  sulfate 2 gm IV-stopped 1/12.  Labs: 1/6 magnesium  1.6  NUTRITION - FOCUSED PHYSICAL EXAM:  Flowsheet Row Most Recent Value  Orbital Region Severe depletion  Upper Arm Region Severe depletion  Thoracic and Lumbar Region Severe depletion  Buccal Region Moderate depletion  Temple Region Severe depletion  Clavicle Bone Region Severe depletion  Clavicle and Acromion Bone Region Severe depletion  Scapular Bone Region Severe depletion  Dorsal Hand Severe depletion  Patellar Region Severe depletion  Anterior Thigh Region Severe depletion  Posterior Calf Region Severe depletion  Edema (RD Assessment) None  Hair Reviewed  Eyes Reviewed  Mouth Reviewed  [white patches on tongue, question if thrush vs substance she drank.]  Skin Reviewed  Nails Reviewed       Diet Order:   Diet Order             Diet NPO time specified  Diet effective midnight           Diet regular Fluid consistency: Thin  Diet effective now                   EDUCATION NEEDS:   Not appropriate for education at this time  Skin:  Skin Assessment: Reviewed RN Assessment  Last BM:  05/23/2023  Height:   Ht Readings from Last 1 Encounters:  05/22/23 5' 1 (1.549 m)    Weight:   Wt Readings from Last 1 Encounters:  05/22/23 40.4 kg    Ideal Body  Weight:  50 kg  BMI:  Body mass index is 16.82 kg/m.  Estimated Nutritional Needs:   Kcal:  1400-1600 kcal/day  Protein:  61-81 gm/day  Fluid:  1500-1600 mL/day    Elveria Sable, RDLD Clinical Dietitian If unable to reach, please contact RD Inpatient secure chat group between 8 am-4 pm daily

## 2023-05-25 ENCOUNTER — Inpatient Hospital Stay (HOSPITAL_COMMUNITY): Payer: Medicare Other | Admitting: Certified Registered Nurse Anesthetist

## 2023-05-25 ENCOUNTER — Other Ambulatory Visit: Payer: Self-pay

## 2023-05-25 ENCOUNTER — Encounter (HOSPITAL_COMMUNITY)
Admission: EM | Disposition: A | Discharge status: Skilled Nursing Facility | Payer: Self-pay | Source: Skilled Nursing Facility | Attending: Internal Medicine

## 2023-05-25 ENCOUNTER — Encounter (HOSPITAL_COMMUNITY): Payer: Self-pay | Admitting: Family Medicine

## 2023-05-25 ENCOUNTER — Inpatient Hospital Stay (HOSPITAL_COMMUNITY): Payer: Medicare Other

## 2023-05-25 DIAGNOSIS — I5022 Chronic systolic (congestive) heart failure: Secondary | ICD-10-CM | POA: Diagnosis not present

## 2023-05-25 DIAGNOSIS — S72011A Unspecified intracapsular fracture of right femur, initial encounter for closed fracture: Secondary | ICD-10-CM

## 2023-05-25 DIAGNOSIS — F17200 Nicotine dependence, unspecified, uncomplicated: Secondary | ICD-10-CM | POA: Diagnosis not present

## 2023-05-25 DIAGNOSIS — I110 Hypertensive heart disease with heart failure: Secondary | ICD-10-CM | POA: Diagnosis not present

## 2023-05-25 DIAGNOSIS — S72001A Fracture of unspecified part of neck of right femur, initial encounter for closed fracture: Secondary | ICD-10-CM

## 2023-05-25 HISTORY — PX: HIP ARTHROPLASTY: SHX981

## 2023-05-25 LAB — CBC
HCT: 22.2 % — ABNORMAL LOW (ref 36.0–46.0)
Hemoglobin: 7.1 g/dL — ABNORMAL LOW (ref 12.0–15.0)
MCH: 21.1 pg — ABNORMAL LOW (ref 26.0–34.0)
MCHC: 32 g/dL (ref 30.0–36.0)
MCV: 65.9 fL — ABNORMAL LOW (ref 80.0–100.0)
Platelets: 400 10*3/uL (ref 150–400)
RBC: 3.37 MIL/uL — ABNORMAL LOW (ref 3.87–5.11)
RDW: 21.9 % — ABNORMAL HIGH (ref 11.5–15.5)
WBC: 7.5 10*3/uL (ref 4.0–10.5)
nRBC: 0 % (ref 0.0–0.2)

## 2023-05-25 LAB — BASIC METABOLIC PANEL
Anion gap: 9 (ref 5–15)
BUN: 12 mg/dL (ref 8–23)
CO2: 26 mmol/L (ref 22–32)
Calcium: 8.6 mg/dL — ABNORMAL LOW (ref 8.9–10.3)
Chloride: 101 mmol/L (ref 98–111)
Creatinine, Ser: 0.87 mg/dL (ref 0.44–1.00)
GFR, Estimated: >60 mL/min (ref >60)
Glucose, Bld: 114 mg/dL — ABNORMAL HIGH (ref 70–99)
Potassium: 3.5 mmol/L (ref 3.5–5.1)
Sodium: 136 mmol/L (ref 135–145)

## 2023-05-25 LAB — TYPE AND SCREEN
ABO/RH(D): B POS
Antibody Screen: NEGATIVE

## 2023-05-25 LAB — PREPARE RBC (CROSSMATCH)

## 2023-05-25 SURGERY — HEMIARTHROPLASTY, HIP, DIRECT ANTERIOR APPROACH, FOR FRACTURE
Anesthesia: General | Site: Hip | Laterality: Right

## 2023-05-25 MED ORDER — ONDANSETRON HCL 4 MG/2ML IJ SOLN
INTRAMUSCULAR | Status: AC
  Filled 2023-05-25: qty 2

## 2023-05-25 MED ORDER — 0.9 % SODIUM CHLORIDE (POUR BTL) OPTIME
TOPICAL | Status: DC | PRN
  Administered 2023-05-25: 1000 mL

## 2023-05-25 MED ORDER — CEFAZOLIN SODIUM-DEXTROSE 2-3 GM-%(50ML) IV SOLR
INTRAVENOUS | Status: DC | PRN
  Administered 2023-05-25: 2 g via INTRAVENOUS

## 2023-05-25 MED ORDER — PROPOFOL 10 MG/ML IV BOLUS
INTRAVENOUS | Status: DC | PRN
  Administered 2023-05-25: 130 mg via INTRAVENOUS

## 2023-05-25 MED ORDER — ACETAMINOPHEN 325 MG PO TABS: 325.0000 mg | ORAL_TABLET | Freq: Four times a day (QID) | ORAL | Status: DC | PRN

## 2023-05-25 MED ORDER — ORAL CARE MOUTH RINSE: 15.0000 mL | Freq: Once | OROMUCOSAL | Status: AC

## 2023-05-25 MED ORDER — METOCLOPRAMIDE HCL 5 MG PO TABS: 5.0000 mg | ORAL_TABLET | Freq: Three times a day (TID) | ORAL | Status: DC | PRN

## 2023-05-25 MED ORDER — TRANEXAMIC ACID-NACL 1000-0.7 MG/100ML-% IV SOLN
INTRAVENOUS | Status: AC
  Filled 2023-05-25: qty 100

## 2023-05-25 MED ORDER — METHOCARBAMOL 500 MG PO TABS
500.0000 mg | ORAL_TABLET | Freq: Four times a day (QID) | ORAL | Status: DC | PRN
  Administered 2023-05-27 (×2): 500 mg via ORAL
  Filled 2023-05-25 (×2): qty 1

## 2023-05-25 MED ORDER — ONDANSETRON HCL 4 MG/2ML IJ SOLN
INTRAMUSCULAR | Status: DC | PRN
  Administered 2023-05-25: 4 mg via INTRAVENOUS

## 2023-05-25 MED ORDER — LACTATED RINGERS IV SOLN: INTRAVENOUS | Status: DC | PRN

## 2023-05-25 MED ORDER — SODIUM CHLORIDE 0.9 % IR SOLN
Status: DC | PRN
  Administered 2023-05-25: 1000 mL

## 2023-05-25 MED ORDER — SUGAMMADEX SODIUM 200 MG/2ML IV SOLN
INTRAVENOUS | Status: DC | PRN
  Administered 2023-05-25: 100 mg via INTRAVENOUS

## 2023-05-25 MED ORDER — PROPOFOL 10 MG/ML IV BOLUS
INTRAVENOUS | Status: AC
  Filled 2023-05-25: qty 20

## 2023-05-25 MED ORDER — HYDROCODONE-ACETAMINOPHEN 7.5-325 MG PO TABS
1.0000 | ORAL_TABLET | ORAL | Status: DC | PRN
  Administered 2023-05-25 – 2023-05-27 (×4): 1 via ORAL
  Filled 2023-05-25 (×4): qty 1

## 2023-05-25 MED ORDER — PHENOL 1.4 % MT LIQD: 1.0000 | OROMUCOSAL | Status: DC | PRN

## 2023-05-25 MED ORDER — CHLORHEXIDINE GLUCONATE 0.12 % MT SOLN: 15.0000 mL | Freq: Once | OROMUCOSAL | Status: AC

## 2023-05-25 MED ORDER — SODIUM CHLORIDE 0.9% IV SOLUTION: Freq: Once | INTRAVENOUS | Status: DC

## 2023-05-25 MED ORDER — METOCLOPRAMIDE HCL 5 MG/ML IJ SOLN: 5.0000 mg | Freq: Three times a day (TID) | INTRAMUSCULAR | Status: DC | PRN

## 2023-05-25 MED ORDER — CHLORHEXIDINE GLUCONATE 0.12 % MT SOLN
OROMUCOSAL | Status: AC
  Administered 2023-05-25: 15 mL via OROMUCOSAL
  Filled 2023-05-25: qty 15

## 2023-05-25 MED ORDER — FENTANYL CITRATE (PF) 250 MCG/5ML IJ SOLN
INTRAMUSCULAR | Status: AC
  Filled 2023-05-25: qty 5

## 2023-05-25 MED ORDER — PHENYLEPHRINE 80 MCG/ML (10ML) SYRINGE FOR IV PUSH (FOR BLOOD PRESSURE SUPPORT)
PREFILLED_SYRINGE | INTRAVENOUS | Status: AC
  Filled 2023-05-25: qty 10

## 2023-05-25 MED ORDER — DEXAMETHASONE SODIUM PHOSPHATE 10 MG/ML IJ SOLN
INTRAMUSCULAR | Status: DC | PRN
  Administered 2023-05-25: 5 mg via INTRAVENOUS

## 2023-05-25 MED ORDER — FENTANYL CITRATE (PF) 250 MCG/5ML IJ SOLN
INTRAMUSCULAR | Status: DC | PRN
  Administered 2023-05-25 (×2): 50 ug via INTRAVENOUS

## 2023-05-25 MED ORDER — MENTHOL 3 MG MT LOZG: 1.0000 | LOZENGE | OROMUCOSAL | Status: DC | PRN

## 2023-05-25 MED ORDER — PHENYLEPHRINE 80 MCG/ML (10ML) SYRINGE FOR IV PUSH (FOR BLOOD PRESSURE SUPPORT)
PREFILLED_SYRINGE | INTRAVENOUS | Status: DC | PRN
  Administered 2023-05-25: 160 ug via INTRAVENOUS
  Administered 2023-05-25: 200 ug via INTRAVENOUS

## 2023-05-25 MED ORDER — FENTANYL CITRATE (PF) 100 MCG/2ML IJ SOLN
INTRAMUSCULAR | Status: AC
  Administered 2023-05-25: 50 ug via INTRAVENOUS
  Filled 2023-05-25: qty 2

## 2023-05-25 MED ORDER — CEFAZOLIN SODIUM-DEXTROSE 2-4 GM/100ML-% IV SOLN
INTRAVENOUS | Status: AC
  Filled 2023-05-25: qty 100

## 2023-05-25 MED ORDER — MORPHINE SULFATE (PF) 2 MG/ML IV SOLN
0.5000 mg | INTRAVENOUS | Status: DC | PRN
Start: 2023-05-25 — End: 2023-05-28

## 2023-05-25 MED ORDER — CEFAZOLIN SODIUM-DEXTROSE 2-4 GM/100ML-% IV SOLN
2.0000 g | Freq: Four times a day (QID) | INTRAVENOUS | Status: AC
  Administered 2023-05-25 – 2023-05-26 (×2): 2 g via INTRAVENOUS
  Filled 2023-05-25 (×2): qty 100

## 2023-05-25 MED ORDER — APIXABAN 2.5 MG PO TABS
2.5000 mg | ORAL_TABLET | Freq: Two times a day (BID) | ORAL | Status: DC
  Administered 2023-05-26 – 2023-05-27 (×4): 2.5 mg via ORAL
  Filled 2023-05-25 (×4): qty 1

## 2023-05-25 MED ORDER — TRANEXAMIC ACID-NACL 1000-0.7 MG/100ML-% IV SOLN
INTRAVENOUS | Status: DC | PRN
  Administered 2023-05-25: 1000 mg via INTRAVENOUS

## 2023-05-25 MED ORDER — DOCUSATE SODIUM 100 MG PO CAPS
100.0000 mg | ORAL_CAPSULE | Freq: Two times a day (BID) | ORAL | Status: DC
  Administered 2023-05-25 – 2023-05-27 (×5): 100 mg via ORAL
  Filled 2023-05-25 (×5): qty 1

## 2023-05-25 MED ORDER — LIDOCAINE 2% (20 MG/ML) 5 ML SYRINGE
INTRAMUSCULAR | Status: DC | PRN
  Administered 2023-05-25: 40 mg via INTRAVENOUS

## 2023-05-25 MED ORDER — HYDROCODONE-ACETAMINOPHEN 5-325 MG PO TABS
1.0000 | ORAL_TABLET | ORAL | Status: DC | PRN
  Administered 2023-05-27 (×2): 2 via ORAL
  Filled 2023-05-25 (×2): qty 2

## 2023-05-25 MED ORDER — METHOCARBAMOL 1000 MG/10ML IJ SOLN: 500.0000 mg | Freq: Four times a day (QID) | INTRAMUSCULAR | Status: DC | PRN

## 2023-05-25 MED ORDER — LIDOCAINE 2% (20 MG/ML) 5 ML SYRINGE
INTRAMUSCULAR | Status: AC
  Filled 2023-05-25: qty 5

## 2023-05-25 MED ORDER — FENTANYL CITRATE (PF) 100 MCG/2ML IJ SOLN: 50.0000 ug | Freq: Once | INTRAMUSCULAR | Status: AC

## 2023-05-25 SURGICAL SUPPLY — 38 items
BAG COUNTER SPONGE SURGICOUNT (BAG) ×1 IMPLANT
BIPOLAR PROS AML 45 (Hips) ×1 IMPLANT
BLADE SAW SGTL 73X25 THK (BLADE) ×1 IMPLANT
BRUSH SCRUB EZ PLAIN DRY (MISCELLANEOUS) ×2 IMPLANT
COVER SURGICAL LIGHT HANDLE (MISCELLANEOUS) ×1 IMPLANT
DRAPE C-ARM 42X72 X-RAY (DRAPES) IMPLANT
DRAPE INCISE IOBAN 85X60 (DRAPES) ×1 IMPLANT
DRAPE STERI IOBAN 125X83 (DRAPES) IMPLANT
DRAPE SURG ORHT 6 SPLT 77X108 (DRAPES) ×2 IMPLANT
DRAPE U-SHAPE 47X51 STRL (DRAPES) ×1 IMPLANT
DRSG MEPILEX POST OP 4X8 (GAUZE/BANDAGES/DRESSINGS) ×1 IMPLANT
ELECT BLADE 6.5 EXT (BLADE) IMPLANT
ELECT CAUTERY BLADE 6.4 (BLADE) IMPLANT
ELECT REM PT RETURN 9FT ADLT (ELECTROSURGICAL) ×1
ELECTRODE REM PT RTRN 9FT ADLT (ELECTROSURGICAL) ×1 IMPLANT
GLOVE BIO SURGEON STRL SZ7.5 (GLOVE) ×1 IMPLANT
GLOVE BIO SURGEON STRL SZ8 (GLOVE) ×1 IMPLANT
GLOVE BIOGEL PI IND STRL 8 (GLOVE) ×2 IMPLANT
GLOVE SURG ORTHO LTX SZ7.5 (GLOVE) ×2 IMPLANT
GOWN STRL REUS W/ TWL LRG LVL3 (GOWN DISPOSABLE) ×1 IMPLANT
GOWN STRL REUS W/ TWL XL LVL3 (GOWN DISPOSABLE) ×1 IMPLANT
HEAD BIPOLAR PROS AML 45 (Hips) IMPLANT
HEAD FEM STD 28X+8.5 STRL (Hips) IMPLANT
KIT BASIN OR (CUSTOM PROCEDURE TRAY) ×1 IMPLANT
KIT TURNOVER KIT B (KITS) ×1 IMPLANT
MANIFOLD NEPTUNE II (INSTRUMENTS) ×1 IMPLANT
NDL 1/2 CIR MAYO (NEEDLE) IMPLANT
NEEDLE 1/2 CIR MAYO (NEEDLE)
PACK TOTAL JOINT (CUSTOM PROCEDURE TRAY) IMPLANT
STAPLER VISISTAT 35W (STAPLE) ×1 IMPLANT
STEM FEM ACTIS HIGH SZ1 (Stem) IMPLANT
SUT ETHIBOND NAB CT1 #1 30IN (SUTURE) IMPLANT
SUT VIC AB 0 CT1 27XBRD ANBCTR (SUTURE) IMPLANT
SUT VIC AB 1 CT1 27XBRD ANBCTR (SUTURE) IMPLANT
SUT VIC AB 2-0 CT2 27 (SUTURE) IMPLANT
TOWEL GREEN STERILE (TOWEL DISPOSABLE) ×1 IMPLANT
TOWEL GREEN STERILE FF (TOWEL DISPOSABLE) ×1 IMPLANT
WATER STERILE IRR 1000ML POUR (IV SOLUTION) ×1 IMPLANT

## 2023-05-25 NOTE — Anesthesia Postprocedure Evaluation (Signed)
 Anesthesia Post Note  Patient: Holly Guerra  Procedure(s) Performed: ARTHROPLASTY BIPOLAR HIP (HEMIARTHROPLASTY) (Right: Hip)     Patient location during evaluation: PACU Anesthesia Type: General Level of consciousness: awake and alert Pain management: pain level controlled Vital Signs Assessment: post-procedure vital signs reviewed and stable Respiratory status: spontaneous breathing, nonlabored ventilation, respiratory function stable and patient connected to nasal cannula oxygen Cardiovascular status: blood pressure returned to baseline and stable Postop Assessment: no apparent nausea or vomiting Anesthetic complications: no   No notable events documented.  Last Vitals:  Vitals:   05/25/23 1600 05/25/23 1617  BP: (!) 141/86 131/80  Pulse: 88 92  Resp: (!) 22 19  Temp:  36.7 C  SpO2: 93% 96%    Last Pain:  Vitals:   05/25/23 1617  TempSrc: Oral  PainSc:                  Garnette FORBES Skillern

## 2023-05-25 NOTE — Anesthesia Preprocedure Evaluation (Addendum)
 Anesthesia Evaluation  Patient identified by MRN, date of birth, ID band Patient awake    Reviewed: Allergy & Precautions, NPO status , Patient's Chart, lab work & pertinent test results, reviewed documented beta blocker date and time   Airway Mallampati: II  TM Distance: >3 FB Neck ROM: Full    Dental  (+) Dental Advisory Given, Edentulous Upper, Edentulous Lower   Pulmonary Current Smoker and Patient abstained from smoking.   Pulmonary exam normal breath sounds clear to auscultation       Cardiovascular hypertension, Pt. on home beta blockers + Peripheral Vascular Disease  Normal cardiovascular exam+ Valvular Problems/Murmurs MR and AS  Rhythm:Regular Rate:Normal     Neuro/Psych negative neurological ROS     GI/Hepatic Neg liver ROS,GERD  Medicated,,  Endo/Other  negative endocrine ROS    Renal/GU negative Renal ROS     Musculoskeletal negative musculoskeletal ROS (+) Arthritis ,  RIGHT HIP FRACTURE   Abdominal   Peds  Hematology  (+) Blood dyscrasia (Eliquis , Plavix ), Sickle cell trait and anemia   Anesthesia Other Findings Day of surgery medications reviewed with the patient.  Reproductive/Obstetrics                              Anesthesia Physical Anesthesia Plan  ASA: 4  Anesthesia Plan: General   Post-op Pain Management: Tylenol  PO (pre-op)*   Induction: Intravenous  PONV Risk Score and Plan: 2 and Dexamethasone  and Ondansetron   Airway Management Planned: Oral ETT  Additional Equipment: ClearSight  Intra-op Plan:   Post-operative Plan: Extubation in OR  Informed Consent: I have reviewed the patients History and Physical, chart, labs and discussed the procedure including the risks, benefits and alternatives for the proposed anesthesia with the patient or authorized representative who has indicated his/her understanding and acceptance.     Dental advisory  given  Plan Discussed with: CRNA  Anesthesia Plan Comments: (Have 2 units pRBC ready)         Anesthesia Quick Evaluation

## 2023-05-25 NOTE — Anesthesia Procedure Notes (Addendum)
 Procedure Name: Intubation Date/Time: 05/25/2023 1:48 PM  Performed by: Evetta Delon BROCKS, CRNAPre-anesthesia Checklist: Patient identified, Emergency Drugs available, Suction available and Patient being monitored Patient Re-evaluated:Patient Re-evaluated prior to induction Oxygen Delivery Method: Circle System Utilized Preoxygenation: Pre-oxygenation with 100% oxygen Induction Type: IV induction Ventilation: Mask ventilation without difficulty Laryngoscope Size: Mac and 4 Grade View: Grade I Tube type: Oral Tube size: 7.0 mm Number of attempts: 1 Airway Equipment and Method: Stylet and Oral airway Placement Confirmation: ETT inserted through vocal cords under direct vision, positive ETCO2 and breath sounds checked- equal and bilateral Secured at: 21 cm Tube secured with: Tape Dental Injury: Teeth and Oropharynx as per pre-operative assessment

## 2023-05-25 NOTE — Plan of Care (Signed)
   Problem: Coping: Goal: Level of anxiety will decrease Outcome: Progressing   Problem: Pain Management: Goal: General experience of comfort will improve Outcome: Progressing   Problem: Skin Integrity: Goal: Risk for impaired skin integrity will decrease Outcome: Progressing

## 2023-05-25 NOTE — Progress Notes (Signed)
 SLP Cancellation Note  Patient Details Name: Holly Guerra MRN: [id number] DOB: 19-Sep-1940   Cancelled treatment:       Reason Eval/Treat Not Completed: Other (comment) NPO for surgery today. SLP will follow for readiness.  Norleen IVAR Blase, MA, CCC-SLP Speech Therapy

## 2023-05-25 NOTE — Op Note (Signed)
 Operative Note  Date of operation: 05/25/2023 Preoperative diagnosis: Right hip displaced subcapital femoral neck fracture Postoperative diagnosis: Same  Procedure: Right direct anterior bipolar hip hemiarthroplasty  Implants: Implant Name Type Inv. Item Serial No. Manufacturer Lot No. LRB No. Used Action  STEM FEM ACTIS HIGH SZ1 - [id number] Stem STEM FEM ACTIS HIGH SZ1  [employer]PU[employer] [id number] Right 1 Implanted  HEAD FEM STD 28X+8.5 STRL - [id number] Hips HEAD FEM STD 28X+8.5 STRL  DEPU[employer]TH[employer] [id number] Right 1 Implanted  BIPOLAR PROS AML 45 - [id number] Hips BIPOLAR PROS AML 45  DEPUY [employer]TH[employer] [id number] Right 1 Implanted   Surgeon: Lonni GRADE. Vernetta, MD Assistant: Tory Gaskins, PA-C  Anesthesia: General Antibiotics: IV Ancef  EBL: 50 cc Complications: None  Indications: The patient is an 83 year old frail female who unfortunately sustained a mechanical fall on Saturday injuring her right hip.  She was brought to the [facility] and found to have a displaced right hip femoral neck fracture.  She has been on blood thinning medications and needed to be off these medications as well as medically stabilized prior to proceeding with a right hip hemiarthroplasty today.  I was asked to take over her orthopedic care today since I am operating today.  I have talked to the patient in length in detail about the surgery and our recommendation of surgery and talked to her daughter as well.  Prior to this mechanical fall the patient does ambulate.  She has slight dementia but is having severe pain with her right hip even with just trying to sit up.  At this point her and her daughter wish surgery to be performed in order to decrease her pain, improve her mobility and improve her quality of life.  I agree with this as well based on speaking with her and assessing her x-rays and medical status.  We did talk in length in detail the risks of acute blood loss anemia, nerve or vessel  injury, fracture, infection, DVT, dislocation, implant failure and leg length differences.  There is also certainly perioperative risk given her comorbidities.  Procedure description: After informed consent was obtained and the appropriate right hip was marked, the patient was brought to the operating room and general anesthesia was stainless she was on the stretcher.  She was next placed supine on the Hana fracture table with a perineal post and placed in both legs and inline skeletal traction boots but no traction applied.  Her right operative hip and pelvis were assessed radiographically.  The right hip was then prepped and draped with DuraPrep and sterile drapes.  Timeout was called and she identifies correct patient correct right hip.  An incision was then made just inferior and posterior to the ASIS and carried slightly obliquely down the leg.  Dissection was carried down to the tensor fascia lata muscle and tensor fascia was then divided longitudinally to proceed with a direct interposed the hip.  Circumflex vessels were identified and cauterized.  The hip capsule was opened in L-type format finding a large hemarthrosis and a displaced femoral neck fracture.  Cobra retractors were placed around remnants of the medial lateral femoral neck and a new femoral neck cut was made just distal to her fracture but proximal to the lesser trochanter.  This was made with an oscillating saw and completed an osteotome.  A corkscrew guide was placed in the femoral head and the femoral head was removed its entirety.  We then trialed this for a size 45 bipolar outer head.  Attention was then turned the femur.  With the right leg externally rotated to 120 degrees, extended and adducted, a Mueller retractors placed medially and a Hohmann retractor was placed by the greater trochanter.  The lateral joint capsule was released and a box cutting osteotome was used in the femoral canal.  Broaching was then initiated using the Actis  broaching system from a size 0 going to only a size 1.  We then trialed a standard offset femoral neck and a 45/+1.5 bipolar head ball.  This was reduced in the pelvis and we felt like we needed more offset and leg length.  We dislocated the trial components and remove those.  We then placed the real Actis femoral component size 1 but a high offset stem.  We then with a 45/28+8.5 bipolar head ball.  Again this was reduced in the pelvis and we are pleased with leg length, offset, range of motion and stability assessed mechanically and radiographically.  We then irrigated the soft tissue with normal saline solution.  The joint capsule was closed with interrupted #1 Ethibond suture followed by #1 Vicryl close the tensor fascia.  0 Vicryl used to close deep tissue and 2-0 Vicryl was used to close subcutaneous tissue.  The skin was closed with staples.  An Aquacel dressing was applied.  The patient was taken off the Hana table, awakened, extubated and taken the recovery room.  Tory Gaskins, PA-C's assistance was medically necessary and crucial for soft tissue management and retraction, helping guide implant placement and a layered closure of the wound.  He was present during the entirety of the case.

## 2023-05-25 NOTE — Transfer of Care (Signed)
 Immediate Anesthesia Transfer of Care Note  Patient: Holly Guerra  Procedure(s) Performed: ARTHROPLASTY BIPOLAR HIP (HEMIARTHROPLASTY) (Right: Hip)  Patient Location: PACU  Anesthesia Type:General  Level of Consciousness: drowsy  Airway & Oxygen Therapy: Patient Spontanous Breathing and Patient connected to face mask oxygen  Post-op Assessment: Report given to RN and Post -op Vital signs reviewed and stable  Post vital signs: Reviewed and stable  Last Vitals:  Vitals Value Taken Time  BP 129/76 05/25/23 1530  Temp    Pulse 95 05/25/23 1524  Resp 26 05/25/23 1530  SpO2 100 % 05/25/23 1524  Vitals shown include unfiled device data.  Last Pain:  Vitals:   05/25/23 1120  TempSrc: Tympanic  PainSc:       Patients Stated Pain Goal: 3 (05/23/23 2106)  Complications: No notable events documented.

## 2023-05-25 NOTE — Progress Notes (Signed)
 PROGRESS NOTE    Holly Guerra [facility]  FMW:[id number] DOB: 03/09/1941 DOA: 05/22/2023 PCP: Elvera Ozell SAUNDERS, PA-C   Brief Narrative:  Pt is a pleasant 83yo female with history of aortic insufficiency, peripheral vascular disease, GERD, essential hypertension, pernicious anemia presented  with right hip pain after a fall, found to have closed right hip fracture, orthopedic surgery following, plan for surgical intervention 05/25/2023. Advance diet today as tolerated postoperatively, continue PT OT with plans disposition to SNF per discussion with family.  Assessment & Plan:   Principal Problem:   Closed right hip fracture, initial encounter [facility]) Active Problems:   Essential hypertension   PVD (peripheral vascular disease) (HCC)   Microcytic anemia   PAF (paroxysmal atrial fibrillation) (HCC)   Chronic heart failure with mildly reduced ejection fraction (HFmrEF, 41-49%) (HCC)  Right hip fracture  -Ortho following, ORIF later today 05/25/2023 -Continue pain-control and supportive care, hold Eliquis  and Plavix    -PT OT to follow along afterwards, expect discharge to SNF given age and comorbidities   PAF, rate controlled - Continue metoprolol , hold Eliquis  (last dose was at 21:00 on 05/22/23)   Chronic HFrEF  - Appears compensated  - Continue beta-blocker, monitor volume status    Iron deficiency/Pernicious anemia  - Managed with oral iron and B12 injections    -Hemoglobin low but stable, may require transfusion intraoperatively versus tomorrow pending blood loss   PAD  - No acute ischemia  - Continue Lipitor , hold Plavix     Dementia  Rule out hospital delirium - Delirium precautions - Start seroquel  tonight per family discussion- low dose 25mg  - increase as necessary  DVT prophylaxis: SCDs Start: 05/23/23 0206 Code Status:   Code Status: Limited: Do not attempt resuscitation (DNR) -DNR-LIMITED -Do Not Intubate/DNI  Family Communication: Daughter updated over the phone  Status is:  Inpatient  Dispo: The patient is from: Home              Anticipated d/c is to: To be determined              Anticipated d/c date is: 48 to 72 hours              Patient currently not medically stable for discharge  Consultants:  Orthopedic surgery  Procedures:  Planned ORIF 05/24/2023  Antimicrobials:  Perioperatively  Subjective: Review of systems limited given patient's mental status, somnolent but arousable this morning, family at bedside -no acute issues or events reported overnight  Objective: Vitals:   05/24/23 2152 05/24/23 2156 05/24/23 2351 05/25/23 0431  BP: (!) 144/75 (!) 144/75 (!) 100/57 102/68  Pulse:  (!) 104 97 76  Resp:  17 16 17   Temp:  98 F (36.7 C) 97.8 F (36.6 C) 97.9 F (36.6 C)  TempSrc:  Oral  Oral  SpO2:  91% 100% 100%  Weight:      Height:        Intake/Output Summary (Last 24 hours) at 05/25/2023 0721 Last data filed at 05/25/2023 0400 Gross per 24 hour  Intake 120 ml  Output 200 ml  Net -80 ml   Filed Weights   05/22/23 2252  Weight: 40.4 kg    Examination:  General: Resting comfortably, no acute distress HEENT:  Normocephalic atraumatic.  Sclerae nonicteric, noninjected.  Left eye lateral strabismus noted. Neck:  Without mass or deformity.  Trachea is midline. Lungs:  Clear to auscultate bilaterally without rhonchi, wheeze, or rales. Heart: Tachycardic without murmurs, rubs, or gallops. Abdomen:  Soft, nontender, nondistended.  Without guarding or rebound. Extremities: Without cyanosis, clubbing, edema, or obvious deformity. Skin:  Warm and dry, no erythema.  Data Reviewed: I have personally reviewed following labs and imaging studies  CBC: Recent Labs  Lab 05/23/23 0015 05/23/23 0352 05/24/23 0443 05/25/23 0553  WBC 10.1 10.6* 9.9 7.5  NEUTROABS 8.2*  --   --   --   HGB 7.1* 7.1* 7.7* 7.1*  HCT 23.3* 23.5* 24.4* 22.2*  MCV 70.2* 70.1* 67.4* 65.9*  PLT 398 402* 441* 400   Basic Metabolic Panel: Recent Labs  Lab  05/19/23 0833 05/23/23 0015 05/23/23 0352 05/24/23 0443 05/25/23 0553  NA 137 137 138 135 136  K 4.3 3.7 4.1 3.5 3.5  CL 102 105 105 100 101  CO2 27 25 27 24 26   GLUCOSE 142* 128* 114* 122* 114*  BUN 16 10 10 9 12   CREATININE 0.85 0.79 0.78 0.79 0.87  CALCIUM  9.6 8.9 8.9 9.0 8.6*  MG  --   --  1.6*  --   --    GFR: Estimated Creatinine Clearance: 32.3 mL/min (by C-G formula based on SCr of 0.87 mg/dL). Liver Function Tests: No results for input(s): AST, ALT, ALKPHOS, BILITOT, PROT, ALBUMIN in the last 168 hours. No results for input(s): LIPASE, AMYLASE in the last 168 hours. No results for input(s): AMMONIA in the last 168 hours. Coagulation Profile: Recent Labs  Lab 05/23/23 0015  INR 1.2   Cardiac Enzymes: No results for input(s): CKTOTAL, CKMB, CKMBINDEX, TROPONINI in the last 168 hours. BNP (last 3 results) No results for input(s): PROBNP in the last 8760 hours. HbA1C: No results for input(s): HGBA1C in the last 72 hours. CBG: No results for input(s): GLUCAP in the last 168 hours. Lipid Profile: No results for input(s): CHOL, HDL, LDLCALC, TRIG, CHOLHDL, LDLDIRECT in the last 72 hours. Thyroid  Function Tests: No results for input(s): TSH, T4TOTAL, FREET4, T3FREE, THYROIDAB in the last 72 hours. Anemia Panel: No results for input(s): VITAMINB12, FOLATE, FERRITIN, TIBC, IRON, RETICCTPCT in the last 72 hours. Sepsis Labs: No results for input(s): PROCALCITON, LATICACIDVEN in the last 168 hours.  Recent Results (from the past 240 hours)  Surgical pcr screen     Status: None   Collection Time: 05/24/23  4:47 PM   Specimen: Nasal Mucosa; Nasal Swab  Result Value Ref Range Status   MRSA, PCR NEGATIVE NEGATIVE Final   Staphylococcus aureus NEGATIVE NEGATIVE Final    Comment: (NOTE) The Xpert SA Assay (FDA approved for NASAL specimens in patients 110 years of age and older), is one component of a  comprehensive surveillance program. It is not intended to diagnose infection nor to guide or monitor treatment. Performed at [facility], [address] [place], [place] [address]          Radiology Studies: DG Knee Right Port Result Date: 05/24/2023 CLINICAL DATA:  Femoral neck fracture.  Pain. EXAM: PORTABLE RIGHT KNEE - 1-2 VIEW COMPARISON:  Pelvis and right hip radiographs 05/22/2023 FINDINGS: There is diffuse decreased bone mineralization. Mild-to-moderate medial joint space narrowing. Minimal superior patellar degenerative spurring. Mild chronic enthesopathic change at the quadriceps insertion on the patella. No acute fracture or dislocation. Moderate to high-grade atherosclerotic calcifications. No joint effusion. 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 Electronically Signed   By: Tanda Lyons M.D.   On: 05/24/2023 20:30    Scheduled Meds:  allopurinol   100 mg Oral QPM   atorvastatin   40 mg Oral Daily   busPIRone   5 mg Oral  TID   cycloSPORINE   1 drop Both Eyes BID   feeding supplement  237 mL Oral BID BM   lactose free nutrition  237 mL Oral QPC supper   metoprolol  tartrate  12.5 mg Oral BID   multivitamin with minerals  1 tablet Oral Daily   nicotine   21 mg Transdermal Daily   pantoprazole   40 mg Oral Daily   QUEtiapine   25 mg Oral QHS   sucralfate   1 g Oral TID with meals   Continuous Infusions:  magnesium  sulfate bolus IVPB Stopped (05/23/23 0731)     LOS: 2 days   Time spent:   Elsie JAYSON [place], DO [facility]  If 7PM-7AM, please contact night-coverage [email or web address]  05/25/2023, 7:21 AM

## 2023-05-25 NOTE — Progress Notes (Signed)
NPO after midnight.

## 2023-05-25 NOTE — Progress Notes (Signed)
 Patient ID: Holly Guerra [facility], female   DOB: 1940/05/22, 83 y.o.   MRN: [id number] I have spoken to the patient today and her daughter who is at the bedside.  I also reviewed her chart.  The patient does have a displaced right hip femoral neck fracture.  We have recommended a right hip bipolar hemiarthroplasty to treat this fracture.  She has had some slight dementia.  She is a tourist information centre manager.  She does report significant right hip pain.  We discussed the risks and benefits of surgery and nonoperative treatment options.  The family and I agree that surgery would be in her best interest given her severe pain and the deformity of her right hip and given the fact that we are looking for improving her quality of life, her mobility and her actives daily living with this type of surgery.  Informed consent has been obtained and the right operative hip has been marked.

## 2023-05-25 NOTE — Progress Notes (Deleted)
Cardiology Clinic Note   Patient Name: Holly Guerra [facility] Date of Encounter: 05/25/2023  Primary Care Provider:  Coralee Rud, PA-C Primary Cardiologist:  Nanetta Batty, MD  Patient Profile    ***  Past Medical History    Past Medical History:  Diagnosis Date   Abdominal bruit    Abnormal echocardiogram    Anemia    Aortic insufficiency    Aortic root dilation (HCC)    Aortic valve disorder    Arthritis    "all over" (07/22/2012)   Arthropathy    Bilateral carotid artery stenosis    Carotid artery disease (HCC)    Chest discomfort    Edema    Exertional shortness of breath    Fatigue    GERD (gastroesophageal reflux disease)    Gout    Hypercholesteremia    Hypertension    Mitral regurgitation and aortic stenosis    Mixed hyperlipidemia    Murmur, cardiac    Peripheral vascular disease (HCC)    Pernicious anemia    PVD (peripheral vascular disease) (HCC)    Shortness of breath    Sickle-cell trait (HCC)    Thoracic aortic aneurysm (HCC)    Tobacco use    URI (upper respiratory infection)    Past Surgical History:  Procedure Laterality Date   ABDOMINAL AORTOGRAM W/LOWER EXTREMITY Bilateral 05/23/2020   Procedure: ABDOMINAL AORTOGRAM W/LOWER EXTREMITY;  Surgeon: Runell Gess, MD;  Location: [facility] INVASIVE CV LAB;  Service: Cardiovascular;  Laterality: Bilateral;   ABDOMINAL HYSTERECTOMY  1970's   APPENDECTOMY  1970's   CARDIOVASCULAR STRESS TEST  06/16/2012   normal myocardial perfusion imaging, LV systolic function was normal without regional wall motion abnormalities, LV EF 60%   DOPPLER ECHOCARDIOGRAPHY  06/16/2012   EF 60-65%, mild concetric LVH, moderate aortic regurg   ESOPHAGOGASTRODUODENOSCOPY (EGD) WITH PROPOFOL N/A 05/15/2023   Procedure: ESOPHAGOGASTRODUODENOSCOPY (EGD) WITH PROPOFOL;  Surgeon: Jeani Hawking, MD;  Location: [facility] ENDOSCOPY;  Service: Gastroenterology;  Laterality: N/A;   LOWER EXTREMITY ANGIOGRAM N/A 07/22/2012   Procedure: LOWER  EXTREMITY ANGIOGRAM;  Surgeon: Runell Gess, MD;  Location: [facility] CATH LAB;  Service: Cardiovascular;  Laterality: N/A;   LOWER EXTREMITY ARTERIAL DOPPLER  08/08/2012   Left SFA stent-open and patent without evidence of hemodynamically significant stenosis   PERCUTANEOUS STENT INTERVENTION Left 07/22/2012   SAVORY DILATION N/A 05/15/2023   Procedure: SAVORY DILATION;  Surgeon: Jeani Hawking, MD;  Location: [facility] ENDOSCOPY;  Service: Gastroenterology;  Laterality: N/A;  16mm   TOTAL THYROIDECTOMY  ~ 2010    Allergies  Allergies  Allergen Reactions   Lactose Intolerance (Gi) Diarrhea    History of Present Illness    ***  Home Medications    Prior to Admission medications   Medication Sig Start Date End Date Taking? Authorizing Provider  acetaminophen (TYLENOL) 500 MG tablet Take 1,000 mg by mouth 3 (three) times daily. May take 2 tablets four times a day as needed    [provider]  allopurinol (ZYLOPRIM) 100 MG tablet Take 1 tablet (100 mg total) by mouth every evening. 05/19/23   Hughie Closs, MD  apixaban (ELIQUIS) 2.5 MG TABS tablet Take 1 tablet (2.5 mg total) by mouth 2 (two) times daily. 05/19/23 06/18/23  Hughie Closs, MD  ascorbic acid (VITAMIN C) 500 MG tablet Take 500 mg by mouth daily.    [provider]  atorvastatin (LIPITOR) 40 MG tablet Take 1 tablet (40 mg total) by mouth daily. 05/19/23 05/18/24  Hughie Closs, MD  busPIRone (BUSPAR) 5 MG tablet Take 1 tablet (5 mg total) by mouth 3 (three) times daily. 05/19/23 06/18/23  Hughie Closs, MD  clopidogrel (PLAVIX) 75 MG tablet Take 1 tablet (75 mg total) by mouth daily. 07/24/12   Robbie Lis M, PA-C  cyanocobalamin (,VITAMIN B-12,) 1000 MCG/ML injection Inject 1,000 mg into the muscle every 30 (thirty) days. 03/08/20   [provider]  cycloSPORINE (RESTASIS) 0.05 % ophthalmic emulsion Place 1 drop into both eyes 2 (two) times daily.    [provider]  Hydrogen Peroxide 3 % MISC Take 10 mLs by  mouth 3 (three) times daily as needed (Oral Rinse). Mix 10ml peroxide with 10ml water for an oral rinse. Swish and spit out    [provider]  Iron-Vitamins (GERITOL) LIQD Take 15 mLs by mouth daily.    [provider]  metoprolol tartrate (LOPRESSOR) 25 MG tablet Take 0.5 tablets (12.5 mg total) by mouth 2 (two) times daily. 05/19/23 06/18/23  Hughie Closs, MD  nicotine (NICODERM CQ - DOSED IN MG/24 HOURS) 21 mg/24hr patch Place 21 mg onto the skin daily.    [provider]  omeprazole (PRILOSEC) 40 MG capsule Take 40 mg by mouth 2 (two) times daily.    [provider]  sucralfate (CARAFATE) 1 GM/10ML suspension Take 10 mLs (1 g total) by mouth every 8 (eight) hours for 6 days. Patient taking differently: Take 1 g by mouth every 8 (eight) hours. USE FOR 5 DAYS 05/19/23 05/25/23  Hughie Closs, MD  traMADol (ULTRAM) 50 MG tablet Take 1 tablet (50 mg total) by mouth every 8 (eight) hours as needed (pain). 05/17/23   Hughie Closs, MD  triamcinolone (KENALOG) 0.1 % paste Use as directed 1 Application in the mouth or throat 3 (three) times daily.    [provider]    Family History    Family History  Problem Relation Age of Onset   Hypertension Mother    Heart disease Mother    She indicated that the status of her mother is unknown.  Social History    Social History   Socioeconomic History   Marital status: Widowed    Spouse name: Not on file   Number of children: 2   Years of education: Not on file   Highest education level: Not on file  Occupational History   Occupation: retired  Tobacco Use   Smoking status: Every Day    Current packs/day: 0.00    Average packs/day: 0.5 packs/day for 45.0 years (22.5 ttl pk-yrs)    Types: Cigarettes    Start date: 05/11/1962    Last attempt to quit: 05/12/2007    Years since quitting: 16.0   Smokeless tobacco: Never   Tobacco comments:    Pt states she smokes "6-7 a day"  Vaping Use   Vaping status: Never  Used  Substance and Sexual Activity   Alcohol use: Yes    Comment: "3 beers a week"   Drug use: No   Sexual activity: Not on file  Other Topics Concern   Not on file  Social History Narrative   Not on file   Social Drivers of Health   Financial Resource Strain: Low Risk  (05/06/2023)   Overall Financial Resource Strain (CARDIA)    Difficulty of Paying Living Expenses: Not very hard  Food Insecurity: No Food Insecurity (05/23/2023)   Hunger Vital Sign    Worried About Running Out of Food in the Last Year:  Never true    Ran Out of Food in the Last Year: Never true  Transportation Needs: No Transportation Needs (05/23/2023)   PRAPARE - Administrator, Civil Service (Medical): No    Lack of Transportation (Non-Medical): No  Physical Activity: Not on file  Stress: Not on file  Social Connections: Not on file  Intimate Partner Violence: Not At Risk (05/23/2023)   Humiliation, Afraid, Rape, and Kick questionnaire    Fear of Current or Ex-Partner: No    Emotionally Abused: No    Physically Abused: No    Sexually Abused: No     Review of Systems    General:  No chills, fever, night sweats or weight changes.  Cardiovascular:  No chest pain, dyspnea on exertion, edema, orthopnea, palpitations, paroxysmal nocturnal dyspnea. Dermatological: No rash, lesions/masses Respiratory: No cough, dyspnea Urologic: No hematuria, dysuria Abdominal:   No nausea, vomiting, diarrhea, bright red blood per rectum, melena, or hematemesis Neurologic:  No visual changes, wkns, changes in mental status. All other systems reviewed and are otherwise negative except as noted above.  Physical Exam    VS:  There were no vitals taken for this visit. , BMI There is no height or weight on file to calculate BMI. GEN: Well nourished, well developed, in no acute distress. HEENT: normal. Neck: Supple, no JVD, carotid bruits, or masses. Cardiac: RRR, no murmurs, rubs, or gallops. No clubbing, cyanosis,  edema.  Radials/DP/PT 2+ and equal bilaterally.  Respiratory:  Respirations regular and unlabored, clear to auscultation bilaterally. GI: Soft, nontender, nondistended, BS + x 4. MS: no deformity or atrophy. Skin: warm and dry, no rash. Neuro:  Strength and sensation are intact. Psych: Normal affect.  Accessory Clinical Findings    Recent Labs: 05/03/2023: B Natriuretic Peptide 4,119.6 05/13/2023: ALT 30 05/23/2023: Magnesium 1.6 05/25/2023: BUN 12; Creatinine, Ser 0.87; Hemoglobin 7.1; Platelets 400; Potassium 3.5; Sodium 136   Recent Lipid Panel No results found for: "CHOL", "TRIG", "HDL", "CHOLHDL", "VLDL", "LDLCALC", "LDLDIRECT"       ECG personally reviewed by me today- ***          Assessment & Plan   1.  ***   Holly Guerra. Holly Kronk NP-C     05/25/2023, 8:21 AM [facility] [address] Office [phone] Fax [phone]    I spent***minutes examining this patient, reviewing medications, and using patient centered shared decision making involving their cardiac care.   I spent greater than 20 minutes reviewing their past medical history,  medications, and prior cardiac tests.

## 2023-05-25 NOTE — Plan of Care (Signed)
  Problem: Clinical Measurements: Goal: Diagnostic test results will improve Outcome: Progressing   Problem: Clinical Measurements: Goal: Respiratory complications will improve Outcome: Progressing   Problem: Health Behavior/Discharge Planning: Goal: Ability to manage health-related needs will improve Outcome: Progressing   Problem: Education: Goal: Knowledge of General Education information will improve Description: Including pain rating scale, medication(s)/side effects and non-pharmacologic comfort measures Outcome: Progressing

## 2023-05-26 ENCOUNTER — Encounter (HOSPITAL_COMMUNITY): Payer: Self-pay | Admitting: Orthopaedic Surgery

## 2023-05-26 DIAGNOSIS — I10 Essential (primary) hypertension: Secondary | ICD-10-CM | POA: Diagnosis not present

## 2023-05-26 DIAGNOSIS — I480 Paroxysmal atrial fibrillation: Secondary | ICD-10-CM | POA: Diagnosis not present

## 2023-05-26 DIAGNOSIS — S72001A Fracture of unspecified part of neck of right femur, initial encounter for closed fracture: Secondary | ICD-10-CM | POA: Diagnosis not present

## 2023-05-26 DIAGNOSIS — D509 Iron deficiency anemia, unspecified: Secondary | ICD-10-CM | POA: Diagnosis not present

## 2023-05-26 DIAGNOSIS — R1310 Dysphagia, unspecified: Secondary | ICD-10-CM

## 2023-05-26 LAB — BASIC METABOLIC PANEL
Anion gap: 10 (ref 5–15)
BUN: 17 mg/dL (ref 8–23)
CO2: 25 mmol/L (ref 22–32)
Calcium: 8.3 mg/dL — ABNORMAL LOW (ref 8.9–10.3)
Chloride: 101 mmol/L (ref 98–111)
Creatinine, Ser: 1.19 mg/dL — ABNORMAL HIGH (ref 0.44–1.00)
GFR, Estimated: 46 mL/min — ABNORMAL LOW (ref >60)
Glucose, Bld: 92 mg/dL (ref 70–99)
Potassium: 3.4 mmol/L — ABNORMAL LOW (ref 3.5–5.1)
Sodium: 136 mmol/L (ref 135–145)

## 2023-05-26 LAB — CBC
HCT: 26.4 % — ABNORMAL LOW (ref 36.0–46.0)
Hemoglobin: 8.8 g/dL — ABNORMAL LOW (ref 12.0–15.0)
MCH: 22.7 pg — ABNORMAL LOW (ref 26.0–34.0)
MCHC: 33.3 g/dL (ref 30.0–36.0)
MCV: 68 fL — ABNORMAL LOW (ref 80.0–100.0)
Platelets: 415 10*3/uL — ABNORMAL HIGH (ref 150–400)
RBC: 3.88 MIL/uL (ref 3.87–5.11)
RDW: 20.9 % — ABNORMAL HIGH (ref 11.5–15.5)
WBC: 9 10*3/uL (ref 4.0–10.5)
nRBC: 0 % (ref 0.0–0.2)

## 2023-05-26 LAB — MAGNESIUM: Magnesium: 1.8 mg/dL (ref 1.7–2.4)

## 2023-05-26 MED ORDER — MAGNESIUM SULFATE 2 GM/50ML IV SOLN
2.0000 g | Freq: Once | INTRAVENOUS | Status: AC
  Administered 2023-05-26: 2 g via INTRAVENOUS
  Filled 2023-05-26: qty 50

## 2023-05-26 MED ORDER — POTASSIUM CHLORIDE CRYS ER 10 MEQ PO TBCR
40.0000 meq | EXTENDED_RELEASE_TABLET | Freq: Once | ORAL | Status: AC
  Administered 2023-05-26: 40 meq via ORAL
  Filled 2023-05-26: qty 4

## 2023-05-26 NOTE — Evaluation (Signed)
 Physical Therapy Evaluation Patient Details Name: Holly Guerra MRN: [id number] DOB: 05-Oct-1940 Today's Date: 05/26/2023  History of Present Illness  Patient is a 83 year old female with mechanical fall sustaining Right hip displaced subcapital femoral neck fracture s/p Right direct anterior bipolar hip hemiarthroplasty. History of hypertension, hyperlipidemia, PAD, chronic HFmrEF, PAF on Eliquis , dementia, dysphasia  Clinical Impression  Patient is agreeable to PT evaluation. Recent discharge from hospital to SNF.  Today the patient required assistance for mobility. She reports 7/10 pain in the right hip with mobility. Transfer training initiated with stand step transfer from bed to chair. Standing tolerance limited for ambulation efforts this session. Recommend to continue PT to maximize independence and facilitate return to prior level of function. Rehabilitation < 3 hours/day recommended after this hospital stay.       If plan is discharge home, recommend the following: A lot of help with walking and/or transfers;A lot of help with bathing/dressing/bathroom;Assistance with cooking/housework;Assist for transportation;Help with stairs or ramp for entrance;Supervision due to cognitive status   Can travel by private vehicle   No    Equipment Recommendations  (to be determined at next level of care)  Recommendations for Other Services       Functional Status Assessment Patient has had a recent decline in their functional status and demonstrates the ability to make significant improvements in function in a reasonable and predictable amount of time.     Precautions / Restrictions Precautions Precautions: Fall (direct anterior hip, no hip precuations) Restrictions Weight Bearing Restrictions Per Provider Order: Yes RLE Weight Bearing Per Provider Order: Weight bearing as tolerated      Mobility  Bed Mobility Overal bed mobility: Needs Assistance Bed Mobility: Supine to Sit      Supine to sit: Mod assist, +2 for physical assistance     General bed mobility comments: increased time required. cues for sequencing and technique    Transfers Overall transfer level: Needs assistance Equipment used: 2 person hand held assist Transfers: Bed to chair/wheelchair/BSC     Step pivot transfers: Min assist, +2 physical assistance       General transfer comment: maximal cues for sequencing, increased time and effort required.    Ambulation/Gait             Pre-gait activities: standing tolerance limited by fatigue, R hip pain with activity. ambulation not attempted General Gait Details: not assessed  Stairs            Wheelchair Mobility     Tilt Bed    Modified Rankin (Stroke Patients Only)       Balance Overall balance assessment: Needs assistance Sitting-balance support: Feet supported Sitting balance-Leahy Scale: Poor Sitting balance - Comments: poor initially with posterior lean, progressing to fair with cues for anterior weight shifting   Standing balance support: During functional activity Standing balance-Leahy Scale: Poor Standing balance comment: external support required                             Pertinent Vitals/Pain Pain Assessment Pain Assessment: 0-10 Pain Score: 7  Pain Location: R hip Pain Descriptors / Indicators: Discomfort, Grimacing Pain Intervention(s): Monitored during session, Limited activity within patient's tolerance, Repositioned, Patient requesting pain meds-RN notified    Home Living Family/patient expects to be discharged to:: Skilled nursing facility                   Additional Comments: recent discharge to SNF  per notes. prior to this, patient was living with her children per notes.    Prior Function Prior Level of Function : Needs assist;History of Falls (last six months)             Mobility Comments: prior to recent admission was ambulatory without DME. notes indicate  patient using rolling walker at SNF, presume assistance required for safety       Extremity/Trunk Assessment   Upper Extremity Assessment Upper Extremity Assessment: Defer to OT evaluation    Lower Extremity Assessment Lower Extremity Assessment: RLE deficits/detail;Generalized weakness RLE Deficits / Details: patient able to activtae hip/knee/ankle movement with guarding. RLE Sensation: WNL       Communication   Communication Communication: No apparent difficulties  Cognition Arousal: Alert Behavior During Therapy: WFL for tasks assessed/performed Overall Cognitive Status: No family/caregiver present to determine baseline cognitive functioning                   Orientation Level: Disoriented to, Situation   Memory: Decreased recall of precautions, Decreased short-term memory Following Commands: Follows one step commands with increased time Safety/Judgement: Decreased awareness of safety, Decreased awareness of deficits   Problem Solving: Decreased initiation, Slow processing, Difficulty sequencing, Requires verbal cues, Requires tactile cues General Comments: needs cues for attention to task        General Comments      Exercises     Assessment/Plan    PT Assessment Patient needs continued PT services  PT Problem List Decreased strength;Decreased activity tolerance;Decreased range of motion;Decreased balance;Decreased mobility;Decreased cognition;Decreased knowledge of use of DME;Decreased safety awareness;Decreased knowledge of precautions;Pain       PT Treatment Interventions DME instruction;Gait training;Stair training;Functional mobility training;Therapeutic activities;Therapeutic exercise;Balance training;Neuromuscular re-education;Patient/family education;Cognitive remediation    PT Goals (Current goals can be found in the Care Plan section)  Acute Rehab PT Goals Patient Stated Goal: to go home PT Goal Formulation: With patient Time For Goal  Achievement: 06/09/23 Potential to Achieve Goals: Fair    Frequency Min 1X/week     Co-evaluation PT/OT/SLP Co-Evaluation/Treatment: Yes Reason for Co-Treatment: To address functional/ADL transfers PT goals addressed during session: Mobility/safety with mobility         AM-PAC PT "6 Clicks" Mobility  Outcome Measure Help needed turning from your back to your side while in a flat bed without using bedrails?: A Lot Help needed moving from lying on your back to sitting on the side of a flat bed without using bedrails?: Total Help needed moving to and from a bed to a chair (including a wheelchair)?: Total Help needed standing up from a chair using your arms (e.g., wheelchair or bedside chair)?: Total Help needed to walk in hospital room?: Total Help needed climbing 3-5 steps with a railing? : Total 6 Click Score: 7    End of Session Equipment Utilized During Treatment: Gait belt Activity Tolerance: Patient tolerated treatment well Patient left: in chair;with call bell/phone within reach;with chair alarm set Nurse Communication: Mobility status PT Visit Diagnosis: Difficulty in walking, not elsewhere classified (R26.2);Other abnormalities of gait and mobility (R26.89);Pain Pain - Right/Left: Right Pain - part of body: Hip    Time: 0932-1007 PT Time Calculation (min) (ACUTE ONLY): 35 min   Charges:   PT Evaluation $PT Eval Low Complexity: 1 Low   PT General Charges $$ ACUTE PT VISIT: 1 Visit        Ozie Bo, PT, MPT   Erlene Hawks 05/26/2023, 10:38 AM

## 2023-05-26 NOTE — TOC Initial Note (Addendum)
 Transition of Care [facility]) - Initial/Assessment Note    Patient Details  Name: Holly Guerra MRN: [id number] Date of Birth: May 17, 1940  Transition of Care [facility]) CM/SW Contact:    Elspeth Hals, LCSW Phone Number: 05/26/2023, 2:21 PM  Clinical Narrative:       Pt listed with fluctuation orientation, is able to participate in conversation.  Son Polly Brink also in room to discuss DC plan.  Pt just DC to [facility] for STR prior to this admission.  Discussed PT recommends more STR, they are both agreeable to this and do want to return to [facility].  Permission given to speak with son Polly Brink, daughter Burdette Carolin and to send referral to [place].             CSW did speak with Cheree at [facility], who confirms pt can return, but will need new insurance auth.    Expected Discharge Plan: Skilled Nursing Facility Barriers to Discharge: Continued Medical Work up   Patient Goals and CMS Choice Patient states their goals for this hospitalization and ongoing recovery are:: back to normal   Choice offered to / list presented to : Patient, Adult Children (son Polly Brink)      Expected Discharge Plan and Services In-house Referral: Clinical Social Work   Post Acute Care Choice: Skilled Nursing Facility Living arrangements for the past 2 months: Single Family Home                                      Prior Living Arrangements/Services Living arrangements for the past 2 months: Single Family Home Lives with:: Adult Children (lives with daugther) Patient language and need for interpreter reviewed:: Yes Do you feel safe going back to the place where you live?: Yes      Need for Family Participation in Patient Care: Yes (Comment) Care giver support system in place?: Yes (comment) Current home services: Other (comment) (none) Criminal Activity/Legal Involvement Pertinent to Current Situation/Hospitalization: No - Comment as needed  Activities of Daily Living   ADL Screening  (condition at time of admission) Independently performs ADLs?: No Does the patient have a NEW difficulty with bathing/dressing/toileting/self-feeding that is expected to last >3 days?: Yes (Initiates electronic notice to provider for possible OT consult) Does the patient have a NEW difficulty with getting in/out of bed, walking, or climbing stairs that is expected to last >3 days?: Yes (Initiates electronic notice to provider for possible PT consult) Does the patient have a NEW difficulty with communication that is expected to last >3 days?: No Is the patient deaf or have difficulty hearing?: No Does the patient have difficulty seeing, even when wearing glasses/contacts?: No Does the patient have difficulty concentrating, remembering, or making decisions?: No  Permission Sought/Granted Permission sought to share information with : Family Supports Permission granted to share information with : Yes, Verbal Permission Granted  Share Information with NAME: sonBrian, daughter Burdette Carolin  Permission granted to share info w AGENCY: SNF        Emotional Assessment Appearance:: Appears stated age Attitude/Demeanor/Rapport: Engaged Affect (typically observed): Pleasant Orientation: : Oriented to Self      Admission diagnosis:  Microcytic anemia [D50.9] Chronic anticoagulation [Z79.01] Elevated random blood glucose level [R73.9] Closed right hip fracture, initial encounter (HCC) [S72.001A] Closed subcapital fracture of neck of right femur, initial encounter (HCC) [S72.011A] Patient Active Problem List   Diagnosis Date Noted   Closed right hip fracture,  initial encounter (HCC) 05/23/2023   PAF (paroxysmal atrial fibrillation) (HCC) 05/23/2023   Chronic heart failure with mildly reduced ejection fraction (HFmrEF, 41-49%) (HCC) 05/23/2023   Esophageal stricture 05/17/2023   Loss of weight 05/11/2023   Protein-calorie malnutrition, severe 05/10/2023   Dysphagia 05/10/2023   Abnormal barium swallow  05/10/2023   Antiplatelet or antithrombotic long-term use 05/10/2023   Microcytic anemia 05/04/2023   GERD (gastroesophageal reflux disease) 05/04/2023   Pernicious anemia 05/04/2023   Hypertensive crisis 05/04/2023   Acute heart failure (HCC) 05/03/2023   PVD (peripheral vascular disease) (HCC) 07/23/2012   Essential hypertension 07/23/2012   HLD (hyperlipidemia) 07/23/2012   History of tobacco abuse 07/23/2012   PCP:  Lajuanda Pile, PA-C Pharmacy:   [id number] - [place], [place] - [address] AT [address]OSS [address] [place] [address] Phone: [phone] Fax: [phone]  [id number] - [place], [place] - [address] AT [facility] [address] [place] [address] Phone: [phone] Fax: [phone]     Social Drivers of Health (SDOH) Social History: SDOH Screenings   Food Insecurity: No Food Insecurity (05/23/2023)  Housing: Low Risk  (05/23/2023)  Transportation Needs: No Transportation Needs (05/23/2023)  Utilities: Not At Risk (05/23/2023)  Alcohol Screen: Low Risk  (05/06/2023)  Financial Resource Strain: Low Risk  (05/06/2023)  Tobacco Use: High Risk (05/25/2023)   SDOH Interventions:     Readmission Risk Interventions     No data to display

## 2023-05-26 NOTE — Progress Notes (Signed)
 Patient ID: Holly Guerra [facility], female   DOB: 08/24/1940, 83 y.o.   MRN: [id number] The patient is resting comfortably this morning.  Her vital signs are stable.  In light of her significant comorbidities, surgery did go well yesterday afternoon with a right hip partial replacement/hemiarthroplasty.  From an orthopedic standpoint, Eliquis  has been resumed.  She can be up with therapy and assistance with full weightbearing as tolerated on her right hip.

## 2023-05-26 NOTE — Evaluation (Signed)
 Occupational Therapy Evaluation Patient Details Name: Elisama Crisologo [facility] MRN: [id number] DOB: [birth year] Today's Date: 05/26/2023   History of Present Illness Patient is a 83 year old female with mechanical fall sustaining Right hip displaced subcapital femoral neck fracture s/p Right direct anterior bipolar hip hemiarthroplasty. History of hypertension, hyperlipidemia, PAD, chronic HFmrEF, PAF on Eliquis , dementia, dysphasia   Clinical Impression   Patient is s/p Right hip hemiarthroplasty surgery resulting in functional limitations due to the deficits listed below (see OT Problem List). Prior to surgery, pt was at [facility] and receiving assistance for BADL tasks and functional transfers. Patient will benefit from acute skilled OT to increase their safety and independence with ADL and functional mobility for ADL to allow facilitate discharge. Patient will benefit from continued inpatient follow up therapy, <3 hours/day. OT will continue to follow patient acutely.          If plan is discharge home, recommend the following: Two people to help with walking and/or transfers;Two people to help with bathing/dressing/bathroom    Functional Status Assessment  Patient has had a recent decline in their functional status and demonstrates the ability to make significant improvements in function in a reasonable and predictable amount of time.  Equipment Recommendations  Other (comment) (defer to next level of care)       Precautions / Restrictions Precautions Precautions: Fall (direct anterior hip, no hip precuations) Restrictions Weight Bearing Restrictions Per Provider Order: Yes RLE Weight Bearing Per Provider Order: Weight bearing as tolerated      Mobility Bed Mobility Overal bed mobility: Needs Assistance Bed Mobility: Supine to Sit     Supine to sit: Mod assist, +2 for physical assistance, HOB elevated, Used rails     General bed mobility comments: increased time required. cues for  sequencing and technique. Pt provided with VC on technique and sequencing during task. Bed pad used to assist with scooting hips towards EOB    Transfers Overall transfer level: Needs assistance Equipment used: 2 person hand held assist, None (2 person face to face technique) Transfers: Bed to chair/wheelchair/BSC Sit to Stand: Min assist, +2 physical assistance, From elevated surface     Step pivot transfers: Min assist, +2 physical assistance     General transfer comment: maximal cues for sequencing, increased time and effort required.      Balance Overall balance assessment: Needs assistance Sitting-balance support: Feet supported Sitting balance-Leahy Scale: Poor Sitting balance - Comments: poor initially with posterior lean, progressing to fair with cues for anterior weight shifting   Standing balance support: During functional activity Standing balance-Leahy Scale: Poor Standing balance comment: external support required          ADL either performed or assessed with clinical judgement   ADL Overall ADL's : Needs assistance/impaired Eating/Feeding: Modified independent   Grooming: Set up;Oral care;Wash/dry face;Wash/dry hands;Sitting   Upper Body Bathing: Set up;Sitting   Lower Body Bathing: Total assistance;Bed level   Upper Body Dressing : Set up;Sitting   Lower Body Dressing: Total assistance;Bed level   Toilet Transfer: Minimal assistance;+2 for physical assistance;Stand-pivot;Cueing for sequencing;Cueing for safety   Toileting- Clothing Manipulation and Hygiene: Total assistance;Sitting/lateral lean;Sit to/from stand        Vision Baseline Vision/History: 1 Wears glasses Ability to See in Adequate Light: 0 Adequate Patient Visual Report: No change from baseline;Blurring of vision Vision Assessment?: No apparent visual deficits     Perception Perception: Not tested       Praxis Praxis: Not tested  Pertinent Vitals/Pain Pain  Assessment Pain Assessment: 0-10 Pain Score: 7  Pain Location: R hip Pain Descriptors / Indicators: Discomfort, Grimacing Pain Intervention(s): Monitored during session, Limited activity within patient's tolerance, Repositioned, Patient requesting pain meds-RN notified     Extremity/Trunk Assessment Upper Extremity Assessment Upper Extremity Assessment: Generalized weakness;Right hand dominant   Lower Extremity Assessment Lower Extremity Assessment: Defer to PT evaluation RLE Deficits / Details: patient able to activtae hip/knee/ankle movement with guarding. RLE Sensation: WNL   Cervical / Trunk Assessment Cervical / Trunk Assessment: Kyphotic   Communication Communication Communication: No apparent difficulties   Cognition Arousal: Alert Behavior During Therapy: WFL for tasks assessed/performed Overall Cognitive Status: No family/caregiver present to determine baseline cognitive functioning          Orientation Level: Disoriented to, Situation   Memory: Decreased recall of precautions, Decreased short-term memory Following Commands: Follows one step commands with increased time Safety/Judgement: Decreased awareness of safety, Decreased awareness of deficits   Problem Solving: Decreased initiation, Slow processing, Difficulty sequencing, Requires verbal cues, Requires tactile cues General Comments: needs cues for attention to task     General Comments  VSS on RA            Home Living Family/patient expects to be discharged to:: Skilled nursing facility          Additional Comments: recent discharge to SNF per notes. prior to this, patient was living with her children per notes.      Prior Functioning/Environment Prior Level of Function : Needs assist;History of Falls (last six months)      Mobility Comments: prior to recent admission was ambulatory without DME. notes indicate patient using rolling walker at SNF, presume assistance required for safety ADLs  Comments: Prior to recent admission to SNF, pt was mod I with all ADL tasks.        OT Problem List: Decreased strength;Decreased activity tolerance;Impaired balance (sitting and/or standing);Decreased safety awareness;Decreased knowledge of use of DME or AE;Pain      OT Treatment/Interventions: Self-care/ADL training;Therapeutic exercise;DME and/or AE instruction;Balance training;Patient/family education;Therapeutic activities;Cognitive remediation/compensation;Manual therapy;Modalities    OT Goals(Current goals can be found in the care plan section) Acute Rehab OT Goals Patient Stated Goal: to get food and pain meds OT Goal Formulation: Patient unable to participate in goal setting Time For Goal Achievement: 06/09/23 Potential to Achieve Goals: Good  OT Frequency: Min 1X/week    Co-evaluation PT/OT/SLP Co-Evaluation/Treatment: Yes Reason for Co-Treatment: To address functional/ADL transfers PT goals addressed during session: Mobility/safety with mobility OT goals addressed during session: ADL's and self-care;Proper use of Adaptive equipment and DME;Strengthening/ROM      AM-PAC OT "6 Clicks" Daily Activity     Outcome Measure Help from another person eating meals?: None Help from another person taking care of personal grooming?: A Little Help from another person toileting, which includes using toliet, bedpan, or urinal?: Total Help from another person bathing (including washing, rinsing, drying)?: A Lot Help from another person to put on and taking off regular upper body clothing?: A Little Help from another person to put on and taking off regular lower body clothing?: Total 6 Click Score: 14   End of Session Equipment Utilized During Treatment: Gait belt Nurse Communication: Mobility status;Patient requests pain meds;Need for lift equipment  Activity Tolerance: Patient limited by pain Patient left: in chair;with call bell/phone within reach;with chair alarm set  OT Visit  Diagnosis: Unsteadiness on feet (R26.81);Muscle weakness (generalized) (M62.81);History of falling (Z91.81);Pain Pain - Right/Left: Right Pain -  part of body: Leg                Time: 1610-9604 OT Time Calculation (min): 43 min Charges:  OT General Charges $OT Visit: 1 Visit OT Evaluation $OT Eval High Complexity: 1 High  [employer], OTR/L,CBIS  Supplemental OT - [facility] and [facility] Secure Chat Preferred    Kaily Wragg, Ocie Belt 05/26/2023, 11:56 AM

## 2023-05-26 NOTE — Care Management Important Message (Signed)
 Important Message  Patient Details  Name: Holly Guerra MRN: [id number] Date of Birth: 24-Aug-1940   Important Message Given:  Yes - Medicare IM     Wynonia Hedges 05/26/2023, 4:02 PM

## 2023-05-26 NOTE — Discharge Instructions (Signed)
 Per [facility] clinic policy, our goal is ensure optimal postoperative pain control with a multimodal pain management strategy. For all [facility] patients, our goal is to wean post-operative narcotic medications by 6 weeks post-operatively. If this is not possible due to utilization of pain medication prior to surgery, your [facility] doctor will support your acute post-operative pain control for the first 6 weeks postoperatively, with a plan to transition you back to your primary pain team following that. Cyndia Skeeters will work to ensure a Therapist, occupational.  INSTRUCTIONS AFTER JOINT REPLACEMENT   Remove items at home which could result in a fall. This includes throw rugs or furniture in walking pathways ICE to the affected joint every three hours while awake for 30 minutes at a time, for at least the first 3-5 days, and then as needed for pain and swelling.  Continue to use ice for pain and swelling. You may notice swelling that will progress down to the foot and ankle.  This is normal after surgery.  Elevate your leg when you are not up walking on it.   Continue to use the breathing machine you got in the hospital (incentive spirometer) which will help keep your temperature down.  It is common for your temperature to cycle up and down following surgery, especially at night when you are not up moving around and exerting yourself.  The breathing machine keeps your lungs expanded and your temperature down.   DIET:  As you were doing prior to hospitalization, we recommend a well-balanced diet.  DRESSING / WOUND CARE / SHOWERING  Keep the surgical dressing until follow up.  The dressing is water proof, so you can shower without any extra covering.  IF THE DRESSING FALLS OFF or the wound gets wet inside, change the dressing with sterile gauze.  Please use good hand washing techniques before changing the dressing.  Do not use any lotions or creams on the incision until instructed by your surgeon.     ACTIVITY  Increase activity slowly as tolerated, but follow the weight bearing instructions below.   No driving for 6 weeks or until further direction given by your physician.  You cannot drive while taking narcotics.  No lifting or carrying greater than 10 lbs. until further directed by your surgeon. Avoid periods of inactivity such as sitting longer than an hour when not asleep. This helps prevent blood clots.  You may return to work once you are authorized by your doctor.     WEIGHT BEARING   Weight bearing as tolerated with assist device (walker, cane, etc) as directed, use it as long as suggested by your surgeon or therapist, typically at least 4-6 weeks.   EXERCISES  Results after joint replacement surgery are often greatly improved when you follow the exercise, range of motion and muscle strengthening exercises prescribed by your doctor. Safety measures are also important to protect the joint from further injury. Any time any of these exercises cause you to have increased pain or swelling, decrease what you are doing until you are comfortable again and then slowly increase them. If you have problems or questions, call your caregiver or physical therapist for advice.   Rehabilitation is important following a joint replacement. After just a few days of immobilization, the muscles of the leg can become weakened and shrink (atrophy).  These exercises are designed to build up the tone and strength of the thigh and leg muscles and to improve motion. Often times heat used for twenty to thirty minutes before  working out will loosen up your tissues and help with improving the range of motion but do not use heat for the first two weeks following surgery (sometimes heat can increase post-operative swelling).   These exercises can be done on a training (exercise) mat, on the floor, on a table or on a bed. Use whatever works the best and is most comfortable for you.    Use music or television  while you are exercising so that the exercises are a pleasant break in your day. This will make your life better with the exercises acting as a break in your routine that you can look forward to.   Perform all exercises about fifteen times, three times per day or as directed.  You should exercise both the operative leg and the other leg as well.  Exercises include:   Quad Sets - Tighten up the muscle on the front of the thigh (Quad) and hold for 5-10 seconds.   Straight Leg Raises - With your knee straight (if you were given a brace, keep it on), lift the leg to 60 degrees, hold for 3 seconds, and slowly lower the leg.  Perform this exercise against resistance later as your leg gets stronger.  Leg Slides: Lying on your back, slowly slide your foot toward your buttocks, bending your knee up off the floor (only go as far as is comfortable). Then slowly slide your foot back down until your leg is flat on the floor again.  Angel Wings: Lying on your back spread your legs to the side as far apart as you can without causing discomfort.  Hamstring Strength:  Lying on your back, push your heel against the floor with your leg straight by tightening up the muscles of your buttocks.  Repeat, but this time bend your knee to a comfortable angle, and push your heel against the floor.  You may put a pillow under the heel to make it more comfortable if necessary.   A rehabilitation program following joint replacement surgery can speed recovery and prevent re-injury in the future due to weakened muscles. Contact your doctor or a physical therapist for more information on knee rehabilitation.    CONSTIPATION  Constipation is defined medically as fewer than three stools per week and severe constipation as less than one stool per week.  Even if you have a regular bowel pattern at home, your normal regimen is likely to be disrupted due to multiple reasons following surgery.  Combination of anesthesia, postoperative  narcotics, change in appetite and fluid intake all can affect your bowels.   YOU MUST use at least one of the following options; they are listed in order of increasing strength to get the job done.  They are all available over the counter, and you may need to use some, POSSIBLY even all of these options:    Drink plenty of fluids (prune juice may be helpful) and high fiber foods Colace 100 mg by mouth twice a day  Senokot for constipation as directed and as needed Dulcolax (bisacodyl), take with full glass of water  Miralax (polyethylene glycol) once or twice a day as needed.  If you have tried all these things and are unable to have a bowel movement in the first 3-4 days after surgery call either your surgeon or your primary doctor.    If you experience loose stools or diarrhea, hold the medications until you stool forms back up.  If your symptoms do not get better within 1 week  or if they get worse, check with your doctor.  If you experience "the worst abdominal pain ever" or develop nausea or vomiting, please contact the office immediately for further recommendations for treatment.   ITCHING:  If you experience itching with your medications, try taking only a single pain pill, or even half a pain pill at a time.  You can also use Benadryl over the counter for itching or also to help with sleep.   TED HOSE STOCKINGS:  Use stockings on both legs until for at least 2 weeks or as directed by physician office. They may be removed at night for sleeping.  MEDICATIONS:  See your medication summary on the "After Visit Summary" that nursing will review with you.  You may have some home medications which will be placed on hold until you complete the course of blood thinner medication.  It is important for you to complete the blood thinner medication as prescribed.  PRECAUTIONS:  If you experience chest pain or shortness of breath - call 911 immediately for transfer to the hospital emergency department.    If you develop a fever greater that 101 F, purulent drainage from wound, increased redness or drainage from wound, foul odor from the wound/dressing, or calf pain - CONTACT YOUR SURGEON.                                                   FOLLOW-UP APPOINTMENTS:  If you do not already have a post-op appointment, please call the office for an appointment to be seen by your surgeon.  Guidelines for how soon to be seen are listed in your "After Visit Summary", but are typically between 1-4 weeks after surgery.  OTHER INSTRUCTIONS:   Knee Replacement:  Do not place pillow under knee, focus on keeping the knee straight while resting. CPM instructions: 0-90 degrees, 2 hours in the morning, 2 hours in the afternoon, and 2 hours in the evening. Place foam block, curve side up under heel at all times except when in CPM or when walking.  DO NOT modify, tear, cut, or change the foam block in any way.  POST-OPERATIVE OPIOID TAPER INSTRUCTIONS: It is important to wean off of your opioid medication as soon as possible. If you do not need pain medication after your surgery it is ok to stop day one. Opioids include: Codeine, Hydrocodone(Norco, Vicodin), Oxycodone(Percocet, oxycontin) and hydromorphone amongst others.  Long term and even short term use of opiods can cause: Increased pain response Dependence Constipation Depression Respiratory depression And more.  Withdrawal symptoms can include Flu like symptoms Nausea, vomiting And more Techniques to manage these symptoms Hydrate well Eat regular healthy meals Stay active Use relaxation techniques(deep breathing, meditating, yoga) Do Not substitute Alcohol to help with tapering If you have been on opioids for less than two weeks and do not have pain than it is ok to stop all together.  Plan to wean off of opioids This plan should start within one week post op of your joint replacement. Maintain the same interval or time between taking each dose  and first decrease the dose.  Cut the total daily intake of opioids by one tablet each day Next start to increase the time between doses. The last dose that should be eliminated is the evening dose.   MAKE SURE YOU:  Understand these instructions.  Get help right away if you are not doing well or get worse.    Thank you for letting us be a part of your medical care team.  It is a privilege we respect greatly.  We hope these instructions will help you stay on track for a fast and full recovery!

## 2023-05-26 NOTE — Evaluation (Addendum)
 Clinical/Bedside Swallow Evaluation Patient Details  Name: Holly Guerra MRN: [id number] Date of Birth: [birth year]  Today's Date: 05/26/2023 Time: SLP Start Time (ACUTE ONLY): 1240 SLP Stop Time (ACUTE ONLY): 1255 SLP Time Calculation (min) (ACUTE ONLY): 15 min  Past Medical History:  Past Medical History:  Diagnosis Date   Abdominal bruit    Abnormal echocardiogram    Anemia    Aortic insufficiency    Aortic root dilation (HCC)    Aortic valve disorder    Arthritis    "all over" (07/22/2012)   Arthropathy    Bilateral carotid artery stenosis    Carotid artery disease (HCC)    Chest discomfort    Edema    Exertional shortness of breath    Fatigue    GERD (gastroesophageal reflux disease)    Gout    Hypercholesteremia    Hypertension    Mitral regurgitation and aortic stenosis    Mixed hyperlipidemia    Murmur, cardiac    Peripheral vascular disease (HCC)    Pernicious anemia    PVD (peripheral vascular disease) (HCC)    Shortness of breath    Sickle-cell trait (HCC)    Thoracic aortic aneurysm (HCC)    Tobacco use    URI (upper respiratory infection)    Past Surgical History:  Past Surgical History:  Procedure Laterality Date   ABDOMINAL AORTOGRAM W/LOWER EXTREMITY Bilateral 05/23/2020   Procedure: ABDOMINAL AORTOGRAM W/LOWER EXTREMITY;  Surgeon: Avanell Leigh, MD;  Location: [facility] INVASIVE CV LAB;  Service: Cardiovascular;  Laterality: Bilateral;   ABDOMINAL HYSTERECTOMY  1970's   APPENDECTOMY  1970's   CARDIOVASCULAR STRESS TEST  06/16/2012   normal myocardial perfusion imaging, LV systolic function was normal without regional wall motion abnormalities, LV EF 60%   DOPPLER ECHOCARDIOGRAPHY  06/16/2012   EF 60-65%, mild concetric LVH, moderate aortic regurg   ESOPHAGOGASTRODUODENOSCOPY (EGD) WITH PROPOFOL  N/A 05/15/2023   Procedure: ESOPHAGOGASTRODUODENOSCOPY (EGD) WITH PROPOFOL ;  Surgeon: Alvis Jourdain, MD;  Location: [facility] ENDOSCOPY;  Service: Gastroenterology;   Laterality: N/A;   HIP ARTHROPLASTY Right 05/25/2023   Procedure: ARTHROPLASTY BIPOLAR HIP (HEMIARTHROPLASTY);  Surgeon: Arnie Lao, MD;  Location: [facility] OR;  Service: Orthopedics;  Laterality: Right;   LOWER EXTREMITY ANGIOGRAM N/A 07/22/2012   Procedure: LOWER EXTREMITY ANGIOGRAM;  Surgeon: Avanell Leigh, MD;  Location: [facility] CATH LAB;  Service: Cardiovascular;  Laterality: N/A;   LOWER EXTREMITY ARTERIAL DOPPLER  08/08/2012   Left SFA stent-open and patent without evidence of hemodynamically significant stenosis   PERCUTANEOUS STENT INTERVENTION Left 07/22/2012   SAVORY DILATION N/A 05/15/2023   Procedure: SAVORY DILATION;  Surgeon: Alvis Jourdain, MD;  Location: [facility] ENDOSCOPY;  Service: Gastroenterology;  Laterality: N/A;  16mm   TOTAL THYROIDECTOMY  ~ 2010   HPI:  Patient is an 83 y.o. female with PMH: GERD, hypertension, hyperlipidemia, PAD, chronic HFmrEF, PAF on Eliquis , dementia, dysphagia. She was recently admitted last month and seen by SLP for evaluation at bedside on 05/09/23 secondary to odynophagia, decreased PO intake. An esophagram completed on 12/30 revealed moderate focal narrowing in the proximal esophagus and irregular and thickened esophageal mucosa concerning for esophagitis. Patient was admitted on 05/23/23 with right hip displaced fracture from mechanical fall. She is s/p right hip hemiarthroplasty. She had EGD with dilation on 05/25/23.    Assessment / Plan / Recommendation  Clinical Impression  Patient is not currently presenting with clinical s/s of dysphagia as per this bedside swallow evaluation. Her son who was in the room reports  that her PO intake has improved since EGD with dilation which was peformed yesterday. Patient herself is complaining of dry mouth which she reported is fairly new. She has been drinking water to try to alleviate this. SLP assessed her swallow function with sips of thin liquids via straw sips. Swallow initiation was timely and no overt s/s  aspiration. SLP provided patient with oral/mouth moisturizer and instructed her and son on how to apply it. SLP not recommending any further skilled intervention. SLP Visit Diagnosis: Dysphagia, unspecified (R13.10)    Aspiration Risk  Mild aspiration risk    Diet Recommendation Regular;Thin liquid    Medication Administration: Other (Comment) (as tolerated) Supervision: Staff to assist with self feeding;Full supervision/cueing for compensatory strategies Compensations: Minimize environmental distractions;Slow rate;Small sips/bites Postural Changes: Seated upright at 90 degrees    Other  Recommendations Oral Care Recommendations: Oral care BID    Recommendations for follow up therapy are one component of a multi-disciplinary discharge planning process, led by the attending physician.  Recommendations may be updated based on patient status, additional functional criteria and insurance authorization.  Follow up Recommendations No SLP follow up      Assistance Recommended at Discharge    Functional Status Assessment Patient has not had a recent decline in their functional status  Frequency and Duration   N/A         Prognosis   N/A     Swallow Study   General Date of Onset: 05/23/23 HPI: Patient is an 83 y.o. female with PMH: GERD, hypertension, hyperlipidemia, PAD, chronic HFmrEF, PAF on Eliquis , dementia, dysphagia. She was recently admitted last month and seen by SLP for evaluation at bedside on 05/09/23 secondary to odynophagia, decreased PO intake. An esophagram completed on 12/30 revealed moderate focal narrowing in the proximal esophagus and irregular and thickened esophageal mucosa concerning for esophagitis. Patient was admitted on 05/23/23 with right hip displaced fracture from mechanical fall. She is s/p right hip hemiarthroplasty. She had EGD with dilation on 05/25/23. Type of Study: Bedside Swallow Evaluation Previous Swallow Assessment: during recent past admission in  December Diet Prior to this Study: Regular;Thin liquids (Level 0) Temperature Spikes Noted: No Respiratory Status: Room air History of Recent Intubation: Yes Total duration of intubation (days):  (for surgery only) Date extubated: 05/25/23 Behavior/Cognition: Cooperative;Pleasant mood;Alert Oral Cavity Assessment: Other (comment) (patient reports mouth being dry) Oral Care Completed by SLP: No Oral Cavity - Dentition: Missing dentition Vision: Functional for self-feeding Self-Feeding Abilities: Able to feed self Patient Positioning: Upright in bed Baseline Vocal Quality: Normal Volitional Cough: Strong Volitional Swallow: Able to elicit    Oral/Motor/Sensory Function Overall Oral Motor/Sensory Function: Within functional limits   Ice Chips     Thin Liquid Thin Liquid: Within functional limits Presentation: Straw;Self Fed    Nectar Thick     Honey Thick     Puree Puree: Not tested   Solid     Solid: Not tested      Jacqualine Mater, MA, CCC-SLP Speech Therapy

## 2023-05-26 NOTE — Progress Notes (Signed)
 PROGRESS NOTE    Holly Guerra [facility]  UJW:[id number] DOB: 09/25/1940 DOA: 05/22/2023 PCP: Lajuanda Pile, PA-C    Chief Complaint  Patient presents with   Fall    Brief Narrative:  Pt is a pleasant 83yo female with history of aortic insufficiency, peripheral vascular disease, GERD, essential hypertension, pernicious anemia presented  with right hip pain after a fall, found to have closed right hip fracture, orthopedic surgery following, plan for surgical intervention 05/25/2023. Advance diet today as tolerated postoperatively, continue PT OT with plans disposition to SNF per discussion with family.    Assessment & Plan:   Principal Problem:   Closed right hip fracture, initial encounter [facility]) Active Problems:   Essential hypertension   PVD (peripheral vascular disease) (HCC)   Microcytic anemia   PAF (paroxysmal atrial fibrillation) (HCC)   Chronic heart failure with mildly reduced ejection fraction (HFmrEF, 41-49%) (HCC)   #1 closed right hip displaced subcapital femoral neck fracture -Secondary to mechanical fall. -Patient seen in consultation by orthopedics and underwent right direct anterior bipolar hip hemiarthroplasty 05/25/2023. -PT/OT, full weightbearing as tolerated to right hip. -Per orthopedics.  2.  Paroxysmal A-fib -Continue metoprolol  for rate control. -Eliquis  held in anticipation of procedure and has been resumed.  3.  Chronic HFrEF -Compensated. -Continue beta-blocker, statin.  4.  Iron deficiency/pernicious anemia -Being managed with oral iron and B12 injections. -Status post transfusion 1 unit PRBCs 05/25/2023. -Hemoglobin stable at 8.8. -Follow H&H. -Transfusion threshold hemoglobin < 8.  5.  PAD -Continue Lipitor . -Plavix  on hold. -Patient on Eliquis .  6.  Dementia, rule out hospital delirium -Delirium precautions -Patient started on Seroquel  25 mg nightly.  7.  Dysphagia status post recent EGD with dilatation -Stable. -Continue PPI.  DVT  prophylaxis: Eliquis  Code Status: DNR Family Communication: Updated patient and son at bedside. Disposition: TBD  Status is: Inpatient Remains inpatient appropriate because: Severity of illness   Consultants:  Orthopedics: Steffanie Edouard, PA 05/24/2023 Orthopedics: Dr. Adrain Alar 05/23/2023  Procedures:  CT head CT C-spine 05/22/2023 Chest x-ray 05/22/2023 Plain films of the right knee 05/24/2023 Plain films of the right hip 05/25/2023 Plain films of right hip and pelvis 05/22/2023 Right direct anterior bipolar hip hemiarthroplasty 05/25/2023 Per Dr Lucienne Ryder, orthopedics.  Antimicrobials:  Anti-infectives (From admission, onward)    Start     Dose/Rate Route Frequency Ordered Stop   05/25/23 2000  ceFAZolin  (ANCEF ) IVPB 2g/100 mL premix        2 g 200 mL/hr over 30 Minutes Intravenous Every 6 hours 05/25/23 1652 05/26/23 0155   05/25/23 0926  ceFAZolin  (ANCEF ) 2-4 GM/100ML-% IVPB       Note to Pharmacy: Franceen Inches: cabinet override      05/25/23 0926 05/25/23 2129   05/23/23 1000  fluconazole  (DIFLUCAN ) tablet 100 mg        100 mg Oral Daily 05/23/23 0208 05/23/23 1225         Subjective: Patient sitting up in recliner.  Denies any chest pain or shortness of breath.  No abdominal pain.  No significant right hip pain.  Awaiting evaluation by PT.  Hopeful to go home from the hospital and would like to avoid SNF if possible.  Objective: Vitals:   05/26/23 0454 05/26/23 0822 05/26/23 1355 05/26/23 1714  BP: 135/75 (!) 140/71 122/76 129/79  Pulse: 86 95 81 83  Resp: 16 16 17 16   Temp: 98 F (36.7 C) 98.5 F (36.9 C) (!) 97.2 F (36.2 C) 98.1 F (36.7 C)  TempSrc: Oral Oral  Oral  SpO2: 97% 96%  100%  Weight:      Height:        Intake/Output Summary (Last 24 hours) at 05/26/2023 2031 Last data filed at 05/26/2023 1603 Gross per 24 hour  Intake 487 ml  Output --  Net 487 ml   Filed Weights   05/22/23 2252  Weight: 40.4 kg    Examination:  General exam: Appears  calm and comfortable  Respiratory system: Clear to auscultation.  No wheezes, no crackles, no rhonchi.  Fair air movement.  Respiratory effort normal. Cardiovascular system: S1 & S2 heard, RRR. No JVD, murmurs, rubs, gallops or clicks. No pedal edema. Gastrointestinal system: Abdomen is nondistended, soft and nontender. No organomegaly or masses felt. Normal bowel sounds heard. Central nervous system: Alert and oriented. No focal neurological deficits. Extremities: Postop dressing right hip.   Skin: No rashes, lesions or ulcers Psychiatry: Judgement and insight appear normal. Mood & affect appropriate.     Data Reviewed: I have personally reviewed following labs and imaging studies  CBC: Recent Labs  Lab 05/23/23 0015 05/23/23 0352 05/24/23 0443 05/25/23 0553 05/26/23 0649  WBC 10.1 10.6* 9.9 7.5 9.0  NEUTROABS 8.2*  --   --   --   --   HGB 7.1* 7.1* 7.7* 7.1* 8.8*  HCT 23.3* 23.5* 24.4* 22.2* 26.4*  MCV 70.2* 70.1* 67.4* 65.9* 68.0*  PLT 398 402* 441* 400 415*    Basic Metabolic Panel: Recent Labs  Lab 05/23/23 0015 05/23/23 0352 05/24/23 0443 05/25/23 0553 05/26/23 0649  NA 137 138 135 136 136  K 3.7 4.1 3.5 3.5 3.4*  CL 105 105 100 101 101  CO2 25 27 24 26 25   GLUCOSE 128* 114* 122* 114* 92  BUN 10 10 9 12 17   CREATININE 0.79 0.78 0.79 0.87 1.19*  CALCIUM  8.9 8.9 9.0 8.6* 8.3*  MG  --  1.6*  --   --  1.8    GFR: Estimated Creatinine Clearance: 23.6 mL/min (A) (by C-G formula based on SCr of 1.19 mg/dL (H)).  Liver Function Tests: No results for input(s): "AST", "ALT", "ALKPHOS", "BILITOT", "PROT", "ALBUMIN" in the last 168 hours.  CBG: No results for input(s): "GLUCAP" in the last 168 hours.   Recent Results (from the past 240 hours)  Surgical pcr screen     Status: None   Collection Time: 05/24/23  4:47 PM   Specimen: Nasal Mucosa; Nasal Swab  Result Value Ref Range Status   MRSA, PCR NEGATIVE NEGATIVE Final   Staphylococcus aureus NEGATIVE NEGATIVE  Final    Comment: (NOTE) The Xpert SA Assay (FDA approved for NASAL specimens in patients 8 years of age and older), is one component of a comprehensive surveillance program. It is not intended to diagnose infection nor to guide or monitor treatment. Performed at [facility], [address] [place], [place] [address]          Radiology Studies: DG HIP UNILAT WITH PELVIS 1V RIGHT Result Date: 05/25/2023 CLINICAL DATA:  Arthroplasty EXAM: DG HIP (WITH OR WITHOUT PELVIS) 1V RIGHT COMPARISON:  05/22/2023 FINDINGS: Multiple intraoperative views demonstrate the previously described right femoral neck fracture. Subsequent placement of total hip arthroplasty, without hardware complication or new fracture. IMPRESSION: Intraoperative imaging of right hip arthroplasty. Electronically Signed   By: Lore Rode M.D.   On: 05/25/2023 18:28   DG C-Arm 1-60 Min-No Report Result Date: 05/25/2023 Fluoroscopy was utilized by the requesting physician.  No  radiographic interpretation.        Scheduled Meds:  sodium chloride    Intravenous Once   allopurinol   100 mg Oral QPM   apixaban   2.5 mg Oral Q12H   atorvastatin   40 mg Oral Daily   busPIRone   5 mg Oral TID   cycloSPORINE   1 drop Both Eyes BID   docusate sodium   100 mg Oral BID   feeding supplement  237 mL Oral BID BM   lactose free nutrition  237 mL Oral QPC supper   metoprolol  tartrate  12.5 mg Oral BID   multivitamin with minerals  1 tablet Oral Daily   nicotine   21 mg Transdermal Daily   pantoprazole   40 mg Oral Daily   QUEtiapine   25 mg Oral QHS   Continuous Infusions:     LOS: 3 days    Time spent: 40 minutes    Hilda Lovings, MD [facility]   To contact the attending provider between 7A-7P or the covering provider during after hours 7P-7A, please log into the web site [email or web address] and access using universal [place] password for that web site. If you do not have the password, please call the  hospital operator.  05/26/2023, 8:31 PM

## 2023-05-26 NOTE — NC FL2 (Signed)
 [place]  MEDICAID FL2 LEVEL OF CARE FORM     IDENTIFICATION  Patient Name: Holly Guerra [facility] Birthdate: 09-Jul-1940 Sex: female Admission Date (Current Location): 05/22/2023  [facility] and [place] Number:  Producer, television/film/video and Address:  [facility] [place], [address] [place], [place] [address]      Provider Number: [id number]  Attending Physician Name and Address:  Armenta Landau, MD  Relative Name and Phone Number:  Sarahgrace, Dileonardo Daughter [phone]  [phone]    Current Level of Care: Hospital Recommended Level of Care: Skilled Nursing Facility Prior Approval Number:    Date Approved/Denied:   PASRR Number: [id number]  Discharge Plan: SNF    Current Diagnoses: Patient Active Problem List   Diagnosis Date Noted   Closed right hip fracture, initial encounter (HCC) 05/23/2023   PAF (paroxysmal atrial fibrillation) (HCC) 05/23/2023   Chronic heart failure with mildly reduced ejection fraction (HFmrEF, 41-49%) (HCC) 05/23/2023   Esophageal stricture 05/17/2023   Loss of weight 05/11/2023   Protein-calorie malnutrition, severe 05/10/2023   Dysphagia 05/10/2023   Abnormal barium swallow 05/10/2023   Antiplatelet or antithrombotic long-term use 05/10/2023   Microcytic anemia 05/04/2023   GERD (gastroesophageal reflux disease) 05/04/2023   Pernicious anemia 05/04/2023   Hypertensive crisis 05/04/2023   Acute heart failure (HCC) 05/03/2023   PVD (peripheral vascular disease) (HCC) 07/23/2012   Essential hypertension 07/23/2012   HLD (hyperlipidemia) 07/23/2012   History of tobacco abuse 07/23/2012    Orientation RESPIRATION BLADDER Height & Weight     Self  Normal Incontinent Weight: 89 lb (40.4 kg) Height:  5\' 1"  (154.9 cm)  BEHAVIORAL SYMPTOMS/MOOD NEUROLOGICAL BOWEL NUTRITION STATUS      Continent Diet (see discharge summary)  AMBULATORY STATUS COMMUNICATION OF NEEDS Skin   Total Care Verbally Surgical wounds                        Personal Care Assistance Level of Assistance  Bathing, Feeding, Dressing Bathing Assistance: Maximum assistance Feeding assistance: Independent Dressing Assistance: Maximum assistance     Functional Limitations Info  Sight, Hearing, Speech Sight Info: Impaired Hearing Info: Adequate Speech Info: Adequate    SPECIAL CARE FACTORS FREQUENCY  PT (By licensed PT), OT (By licensed OT)     PT Frequency: 5x week OT Frequency: 5x week            Contractures Contractures Info: Not present    Additional Factors Info  Code Status, Allergies Code Status Info: DNR Allergies Info: lactose intolerance           Current Medications (05/26/2023):  This is the current hospital active medication list Current Facility-Administered Medications  Medication Dose Route Frequency Provider Last Rate Last Admin   0.9 %  sodium chloride  infusion (Manually program via Guardrails IV Fluids)   Intravenous Once Arnie Lao, MD       acetaminophen  (TYLENOL ) tablet 325-650 mg  325-650 mg Oral Q6H PRN Arnie Lao, MD       allopurinol  (ZYLOPRIM ) tablet 100 mg  100 mg Oral QPM Arnie Lao, MD   100 mg at 05/25/23 1903   apixaban  (ELIQUIS ) tablet 2.5 mg  2.5 mg Oral Q12H Arnie Lao, MD   2.5 mg at 05/26/23 1020   atorvastatin  (LIPITOR ) tablet 40 mg  40 mg Oral Daily Arnie Lao, MD   40 mg at 05/26/23 1020   busPIRone  (BUSPAR ) tablet 5 mg  5 mg Oral TID  Arnie Lao, MD   5 mg at 05/26/23 1230   cycloSPORINE  (RESTASIS ) 0.05 % ophthalmic emulsion 1 drop  1 drop Both Eyes BID Arnie Lao, MD   1 drop at 05/26/23 1019   docusate sodium  (COLACE) capsule 100 mg  100 mg Oral BID Arnie Lao, MD   100 mg at 05/26/23 1020   feeding supplement (ENSURE ENLIVE / ENSURE PLUS) liquid 237 mL  237 mL Oral BID BM Arnie Lao, MD   237 mL at 05/26/23 1348   fentaNYL  (SUBLIMAZE ) injection 12.5-50 mcg  12.5-50 mcg  Intravenous Q2H PRN Arnie Lao, MD   50 mcg at 05/24/23 1357   HYDROcodone -acetaminophen  (NORCO) 7.5-325 MG per tablet 1-2 tablet  1-2 tablet Oral Q4H PRN Arnie Lao, MD   1 tablet at 05/26/23 1020   HYDROcodone -acetaminophen  (NORCO/VICODIN) 5-325 MG per tablet 1-2 tablet  1-2 tablet Oral Q4H PRN Arnie Lao, MD       lactose free nutrition (BOOST PLUS) liquid 237 mL  237 mL Oral QPC supper Arnie Lao, MD   237 mL at 05/24/23 1722   magnesium  sulfate IVPB 2 g 50 mL  2 g Intravenous Once Armenta Landau, MD 50 mL/hr at 05/26/23 1349 2 g at 05/26/23 1349   melatonin tablet 3 mg  3 mg Oral QHS PRN Arnie Lao, MD   3 mg at 05/24/23 2155   menthol -cetylpyridinium (CEPACOL) lozenge 3 mg  1 lozenge Oral PRN Arnie Lao, MD       Or   phenol (CHLORASEPTIC) mouth spray 1 spray  1 spray Mouth/Throat PRN Arnie Lao, MD       methocarbamol  (ROBAXIN ) tablet 500 mg  500 mg Oral Q6H PRN Arnie Lao, MD       Or   methocarbamol  (ROBAXIN ) injection 500 mg  500 mg Intravenous Q6H PRN Arnie Lao, MD       metoCLOPramide  (REGLAN ) tablet 5-10 mg  5-10 mg Oral Q8H PRN Arnie Lao, MD       Or   metoCLOPramide  (REGLAN ) injection 5-10 mg  5-10 mg Intravenous Q8H PRN Arnie Lao, MD       metoprolol  tartrate (LOPRESSOR ) tablet 12.5 mg  12.5 mg Oral BID Arnie Lao, MD   12.5 mg at 05/26/23 1019   morphine  (PF) 2 MG/ML injection 0.5-1 mg  0.5-1 mg Intravenous Q2H PRN Arnie Lao, MD       multivitamin with minerals tablet 1 tablet  1 tablet Oral Daily Blackman, Christopher Y, MD   1 tablet at 05/26/23 1019   nicotine  (NICODERM CQ  - dosed in mg/24 hours) patch 21 mg  21 mg Transdermal Daily Arnie Lao, MD   21 mg at 05/26/23 1019   pantoprazole  (PROTONIX ) EC tablet 40 mg  40 mg Oral Daily Arnie Lao, MD   40 mg at 05/26/23 1019    prochlorperazine  (COMPAZINE ) injection 5 mg  5 mg Intravenous Q6H PRN Arnie Lao, MD       QUEtiapine  (SEROQUEL ) tablet 25 mg  25 mg Oral QHS Arnie Lao, MD   25 mg at 05/25/23 2045   senna-docusate (Senokot-S) tablet 1 tablet  1 tablet Oral QHS PRN Arnie Lao, MD         Discharge Medications: Please see discharge summary for a list of discharge medications.  Relevant Imaging Results:  Relevant Lab Results:   Additional Information SSN [id number]  [id number]  Elspeth Hals, LCSW

## 2023-05-27 DIAGNOSIS — I480 Paroxysmal atrial fibrillation: Secondary | ICD-10-CM | POA: Diagnosis not present

## 2023-05-27 DIAGNOSIS — I10 Essential (primary) hypertension: Secondary | ICD-10-CM | POA: Diagnosis not present

## 2023-05-27 DIAGNOSIS — Z720 Tobacco use: Secondary | ICD-10-CM

## 2023-05-27 DIAGNOSIS — Z7901 Long term (current) use of anticoagulants: Secondary | ICD-10-CM

## 2023-05-27 DIAGNOSIS — D509 Iron deficiency anemia, unspecified: Secondary | ICD-10-CM | POA: Diagnosis not present

## 2023-05-27 DIAGNOSIS — S72001A Fracture of unspecified part of neck of right femur, initial encounter for closed fracture: Secondary | ICD-10-CM | POA: Diagnosis not present

## 2023-05-27 LAB — CBC
HCT: 27.4 % — ABNORMAL LOW (ref 36.0–46.0)
Hemoglobin: 9 g/dL — ABNORMAL LOW (ref 12.0–15.0)
MCH: 22.6 pg — ABNORMAL LOW (ref 26.0–34.0)
MCHC: 32.8 g/dL (ref 30.0–36.0)
MCV: 68.7 fL — ABNORMAL LOW (ref 80.0–100.0)
Platelets: 420 10*3/uL — ABNORMAL HIGH (ref 150–400)
RBC: 3.99 MIL/uL (ref 3.87–5.11)
RDW: 21.6 % — ABNORMAL HIGH (ref 11.5–15.5)
WBC: 7.5 10*3/uL (ref 4.0–10.5)
nRBC: 0 % (ref 0.0–0.2)

## 2023-05-27 LAB — BASIC METABOLIC PANEL
Anion gap: 7 (ref 5–15)
BUN: 17 mg/dL (ref 8–23)
CO2: 24 mmol/L (ref 22–32)
Calcium: 8.4 mg/dL — ABNORMAL LOW (ref 8.9–10.3)
Chloride: 101 mmol/L (ref 98–111)
Creatinine, Ser: 1.07 mg/dL — ABNORMAL HIGH (ref 0.44–1.00)
GFR, Estimated: 52 mL/min — ABNORMAL LOW (ref >60)
Glucose, Bld: 109 mg/dL — ABNORMAL HIGH (ref 70–99)
Potassium: 4.4 mmol/L (ref 3.5–5.1)
Sodium: 132 mmol/L — ABNORMAL LOW (ref 135–145)

## 2023-05-27 LAB — MAGNESIUM: Magnesium: 2.3 mg/dL (ref 1.7–2.4)

## 2023-05-27 MED ORDER — TIOTROPIUM BROMIDE MONOHYDRATE 18 MCG IN CAPS: 18.0000 ug | ORAL_CAPSULE | Freq: Every day | RESPIRATORY_TRACT | Status: DC

## 2023-05-27 MED ORDER — QUETIAPINE FUMARATE 25 MG PO TABS: 25.0000 mg | ORAL_TABLET | Freq: Every evening | ORAL | 0 refills | Status: DC | PRN

## 2023-05-27 MED ORDER — ADULT MULTIVITAMIN W/MINERALS CH: 1.0000 | ORAL_TABLET | Freq: Every day | ORAL | Status: DC

## 2023-05-27 MED ORDER — HYDROCODONE-ACETAMINOPHEN 5-325 MG PO TABS: 1.0000 | ORAL_TABLET | Freq: Four times a day (QID) | ORAL | 0 refills | Status: DC | PRN

## 2023-05-27 MED ORDER — ENSURE ENLIVE PO LIQD: 237.0000 mL | Freq: Two times a day (BID) | ORAL | Status: DC

## 2023-05-27 MED ORDER — MOMETASONE FURO-FORMOTEROL FUM 200-5 MCG/ACT IN AERO: 2.0000 | INHALATION_SPRAY | Freq: Two times a day (BID) | RESPIRATORY_TRACT | Status: DC

## 2023-05-27 MED ORDER — ALBUTEROL SULFATE HFA 108 (90 BASE) MCG/ACT IN AERS: INHALATION_SPRAY | RESPIRATORY_TRACT | Status: DC

## 2023-05-27 MED ORDER — MELATONIN 3 MG PO TABS: 3.0000 mg | ORAL_TABLET | Freq: Every evening | ORAL | Status: DC | PRN

## 2023-05-27 MED ORDER — BUSPIRONE HCL 5 MG PO TABS: 5.0000 mg | ORAL_TABLET | Freq: Three times a day (TID) | ORAL | 0 refills | Status: DC

## 2023-05-27 MED ORDER — DOCUSATE SODIUM 100 MG PO CAPS: 100.0000 mg | ORAL_CAPSULE | Freq: Two times a day (BID) | ORAL | Status: DC

## 2023-05-27 MED ORDER — MOMETASONE FURO-FORMOTEROL FUM 200-5 MCG/ACT IN AERO
2.0000 | INHALATION_SPRAY | Freq: Two times a day (BID) | RESPIRATORY_TRACT | Status: DC
  Filled 2023-05-27: qty 8.8

## 2023-05-27 MED ORDER — CYANOCOBALAMIN 1000 MCG/ML IJ SOLN: 1000.0000 ug | INTRAMUSCULAR | Status: AC

## 2023-05-27 MED ORDER — IPRATROPIUM-ALBUTEROL 0.5-2.5 (3) MG/3ML IN SOLN: 3.0000 mL | Freq: Four times a day (QID) | RESPIRATORY_TRACT | Status: DC | PRN

## 2023-05-27 MED ORDER — IPRATROPIUM-ALBUTEROL 0.5-2.5 (3) MG/3ML IN SOLN: 3.0000 mL | Freq: Three times a day (TID) | RESPIRATORY_TRACT | Status: DC

## 2023-05-27 NOTE — TOC Transition Note (Addendum)
Transition of Care [facility]) - Discharge Note   Patient Details  Name: JAZZELYN OCONNELL MRN: [id number] Date of Birth: 18-May-1940  [facility] CM/SW Contact:  Lorri Frederick, LCSW Phone Number: 05/27/2023, 3:16 PM   Clinical Narrative: Pt discharging to [facility], room 408.  RN call report to [phone].       Final next level of care: Skilled Nursing Facility Barriers to Discharge: Barriers Resolved   Patient Goals and CMS Choice Patient states their goals for this hospitalization and ongoing recovery are:: back to normal   Choice offered to / list presented to : Patient, Adult Children (son Arlys John)      Discharge Placement              Patient chooses bed at:  [facility]) Patient to be transferred to facility by: [facility] Name of family member notified: son Arlys John in room Patient and family notified of of transfer: 05/27/23  Discharge Plan and Services Additional resources added to the After Visit Summary for   In-house Referral: Clinical Social Work   Post Acute Care Choice: Skilled Nursing Facility                               Social Drivers of Health (SDOH) Interventions SDOH Screenings   Food Insecurity: No Food Insecurity (05/23/2023)  Housing: Low Risk  (05/23/2023)  Transportation Needs: No Transportation Needs (05/23/2023)  Utilities: Not At Risk (05/23/2023)  Alcohol Screen: Low Risk  (05/06/2023)  Financial Resource Strain: Low Risk  (05/06/2023)  Tobacco Use: High Risk (05/25/2023)     Readmission Risk Interventions     No data to display

## 2023-05-27 NOTE — Discharge Summary (Signed)
Physician Discharge Summary  Tee Neve [facility] ZOX:[id number] DOB: 1940-06-24 DOA: 05/22/2023  PCP: Coralee Rud, PA-C  Admit date: 05/22/2023 Discharge date: 05/27/2023  Time spent: 60 minutes  Recommendations for Outpatient Follow-up:  Follow-up with Dr. Magnus Ivan, orthopedics in 2 weeks. Follow-up with MD at SNF.  Patient will need a basic metabolic profile, CBC done in 1 week to follow-up on electrolytes, renal function, hemoglobin.   Discharge Diagnoses:  Principal Problem:   Closed right hip fracture, initial encounter [facility]) Active Problems:   Essential hypertension   PVD (peripheral vascular disease) (HCC)   Tobacco abuse   Microcytic anemia   Chronic anticoagulation   PAF (paroxysmal atrial fibrillation) (HCC)   Chronic heart failure with mildly reduced ejection fraction (HFmrEF, 41-49%) (HCC)   Discharge Condition: Stable and improved.  Diet recommendation: Heart healthy  Filed Weights   05/22/23 2252  Weight: 40.4 kg    History of present illness:  HPI per Dr. Keturah Shavers is a 83 y.o. female with medical history significant for hypertension, hyperlipidemia, PAD, chronic HFmrEF, PAF on Eliquis, dementia, and intermittent mild solid dysphagia status post recent EGD with dilation who presents with right hip pain after a fall.   The patient's daughter is at the bedside and assists with the history.  Patient had apparently been doing fairly well at her SNF following hospital discharge on 05/19/2023.  She had had an uneventful day, took her evening medications, and was ambulating when she tripped and fell.  She was experiencing severe right hip and thigh pain and reports knowing immediately that she had broken something.  She denies any other appreciable injury.  She denies any recent shortness of breath, melena, hematochezia, or chest pain.  She never experiences chest pain with activities.  She took Eliquis at approximately 9 PM.   ED Course: Upon arrival to the ED,  patient is found to be afebrile and saturating well on room air with normal heart rate and stable blood pressure.  Labs are most notable for hemoglobin 7.1.  Plain radiographs of the hip demonstrate displaced right femoral neck fracture.   Orthopedic surgery (Dr. Hulda Humphrey) was consulted by the ED physician, type and screen was performed, and the patient was given morphine and Percocet.  Hospital Course:  #1 closed right hip displaced subcapital femoral neck fracture -Secondary to mechanical fall. -Patient seen in consultation by orthopedics and underwent right direct anterior bipolar hip hemiarthroplasty 05/25/2023. -PT/OT, full weightbearing as tolerated to right hip. -Patient improved clinically, cleared by orthopedics for discharge to SNF. -Patient was discharged back to SNF with outpatient follow-up with orthopedics.   2.  Paroxysmal A-fib -Patient maintained on home regimen metoprolol for rate control.   -Eliquis held initially prior to procedure and resume postoperatively.   -Outpatient follow-up.   3.  Chronic HFrEF -Compensated. -Patient maintained on home regimen beta-blocker and statin.    4.  Iron deficiency/pernicious anemia -Being managed with oral iron and B12 injections. -Status post transfusion 1 unit PRBCs 05/25/2023. -Hemoglobin stabilized at 9.0 by day of discharge.    5.  PAD -Patient's Plavix and Eliquis were held during the hospitalization preoperatively.  -Eliquis subsequently resumed postoperatively.   -Patient maintained on Lipitor.   -Plavix will be resumed on discharge.     6.  Dementia, rule out hospital delirium -Delirium precautions -Patient started on Seroquel 25 mg nightly. -Patient was discharged on Seroquel 25 mg nightly as needed.   7.  Dysphagia status post recent EGD with dilatation -  Stable. -Patient maintained on PPI.   -Outpatient follow-up.  8.  Tobacco abuse -Tobacco cessation stressed to patient. -Findings of emphysema noted on  imaging. -Patient placed on Dulera twice daily and will be discharged on Spiriva daily as well in addition to albuterol MDI as needed.  Procedures: CT head CT C-spine 05/22/2023 Chest x-ray 05/22/2023 Plain films of the right knee 05/24/2023 Plain films of the right hip 05/25/2023 Plain films of right hip and pelvis 05/22/2023 Right direct anterior bipolar hip hemiarthroplasty 05/25/2023 Per Dr Magnus Ivan, orthopedics.  Consultations: Orthopedics: Earney Hamburg, [place] 05/24/2023 Orthopedics: Dr. Hulda Humphrey 05/23/2023    Discharge Exam: Vitals:   05/27/23 0826 05/27/23 1442  BP: (!) 144/74 137/68  Pulse: 80 88  Resp: 18 18  Temp: 97.6 F (36.4 C) 97.6 F (36.4 C)  SpO2: 100% 100%    General: NAD Cardiovascular: RRR with 3/6 SEM.  No JVD, no lower extremity edema.  Respiratory: Minimal basilar wheezing, no crackles, no rhonchi, fair air movement, speaking in full sentences.   Discharge Instructions   Discharge Instructions     Diet - low sodium heart healthy   Complete by: As directed    Increase activity slowly   Complete by: As directed       Allergies as of 05/27/2023       Reactions   Lactose Intolerance (gi) Diarrhea        Medication List     STOP taking these medications    fluconazole 100 MG tablet Commonly known as: DIFLUCAN   sucralfate 1 GM/10ML suspension Commonly known as: CARAFATE   traMADol 50 MG tablet Commonly known as: ULTRAM       TAKE these medications    acetaminophen 500 MG tablet Commonly known as: TYLENOL Take 1,000 mg by mouth 3 (three) times daily. May take 2 tablets four times a day as needed   albuterol 108 (90 Base) MCG/ACT inhaler Commonly known as: VENTOLIN HFA Inhale 2 puffs into the lungs 3 (three) times daily for 3 days, THEN 2 puffs every 6 (six) hours as needed for wheezing or shortness of breath. Start taking on: May 27, 2023   allopurinol 100 MG tablet Commonly known as: ZYLOPRIM Take 1 tablet (100 mg total) by  mouth every evening.   apixaban 2.5 MG Tabs tablet Commonly known as: ELIQUIS Take 1 tablet (2.5 mg total) by mouth 2 (two) times daily.   ascorbic acid 500 MG tablet Commonly known as: VITAMIN C Take 500 mg by mouth daily.   atorvastatin 40 MG tablet Commonly known as: Lipitor Take 1 tablet (40 mg total) by mouth daily.   busPIRone 5 MG tablet Commonly known as: BUSPAR Take 1 tablet (5 mg total) by mouth 3 (three) times daily.   clopidogrel 75 MG tablet Commonly known as: Plavix Take 1 tablet (75 mg total) by mouth daily.   cyanocobalamin 1000 MCG/ML injection Commonly known as: VITAMIN B12 Inject 1 mL (1,000 mcg total) into the muscle every 30 (thirty) days. What changed: how much to take   cycloSPORINE 0.05 % ophthalmic emulsion Commonly known as: RESTASIS Place 1 drop into both eyes 2 (two) times daily.   docusate sodium 100 MG capsule Commonly known as: COLACE Take 1 capsule (100 mg total) by mouth 2 (two) times daily.   feeding supplement Liqd Take 237 mLs by mouth 2 (two) times daily between meals.   Geritol Liqd Take 15 mLs by mouth daily.   HYDROcodone-acetaminophen 5-325 MG tablet Commonly known as: NORCO/VICODIN Take  1-2 tablets by mouth every 6 (six) hours as needed for moderate pain (pain score 4-6).   Hydrogen Peroxide 3 % Misc Take 10 mLs by mouth 3 (three) times daily as needed (Oral Rinse). Mix 10ml peroxide with 10ml water for an oral rinse. Swish and spit out   melatonin 3 MG Tabs tablet Take 1 tablet (3 mg total) by mouth at bedtime as needed.   metoprolol tartrate 25 MG tablet Commonly known as: LOPRESSOR Take 0.5 tablets (12.5 mg total) by mouth 2 (two) times daily.   mometasone-formoterol 200-5 MCG/ACT Aero Commonly known as: DULERA Inhale 2 puffs into the lungs 2 (two) times daily.   nicotine 21 mg/24hr patch Commonly known as: NICODERM CQ - dosed in mg/24 hours Place 21 mg onto the skin daily.   omeprazole 40 MG capsule Commonly  known as: PRILOSEC Take 40 mg by mouth 2 (two) times daily.   QUEtiapine 25 MG tablet Commonly known as: SEROQUEL Take 1 tablet (25 mg total) by mouth at bedtime as needed (agitation).   tiotropium 18 MCG inhalation capsule Commonly known as: Spiriva HandiHaler Place 1 capsule (18 mcg total) into inhaler and inhale daily.   triamcinolone 0.1 % paste Commonly known as: KENALOG Use as directed 1 Application in the mouth or throat 3 (three) times daily.               Durable Medical Equipment  (From admission, onward)           Start     Ordered   05/25/23 1652  DME 3 n 1  Once        05/25/23 1651   05/25/23 1652  DME Walker rolling  Once       Question Answer Comment  Walker: With 5 Inch Wheels   Patient needs a walker to treat with the following condition Status post hip hemiarthroplasty      05/25/23 1651           Allergies  Allergen Reactions   Lactose Intolerance (Gi) Diarrhea    Contact information for follow-up providers     Kathryne Hitch, MD. Schedule an appointment as soon as possible for a visit in 2 week(s).   Specialty: Orthopedic Surgery Contact information: [address] [place] [address] [phone]         MD at SNF Follow up.               Contact information for after-discharge care     Destination     [facility] .   Service: Skilled Nursing Contact information: [address] [place] [address] [phone]                      The results of significant diagnostics from this hospitalization (including imaging, microbiology, ancillary and laboratory) are listed below for reference.    Significant Diagnostic Studies: DG HIP UNILAT WITH PELVIS 1V RIGHT Result Date: 05/25/2023 CLINICAL DATA:  Arthroplasty EXAM: DG HIP (WITH OR WITHOUT PELVIS) 1V RIGHT COMPARISON:  05/22/2023 FINDINGS: Multiple intraoperative views demonstrate the previously described  right femoral neck fracture. Subsequent placement of total hip arthroplasty, without hardware complication or new fracture. IMPRESSION: Intraoperative imaging of right hip arthroplasty. Electronically Signed   By: Jeronimo Greaves M.D.   On: 05/25/2023 18:28   DG C-Arm 1-60 Min-No Report Result Date: 05/25/2023 Fluoroscopy was utilized by the requesting physician.  No radiographic interpretation.   DG Knee Right Port Result  Date: 05/24/2023 CLINICAL DATA:  Femoral neck fracture.  Pain. EXAM: PORTABLE RIGHT KNEE - 1-2 VIEW COMPARISON:  Pelvis and right hip radiographs 05/22/2023 FINDINGS: There is diffuse decreased bone mineralization. Mild-to-moderate medial joint space narrowing. Minimal superior patellar degenerative spurring. Mild chronic enthesopathic change at the quadriceps insertion on the patella. No acute fracture or dislocation. Moderate to high-grade atherosclerotic calcifications. No joint effusion. 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 Electronically Signed   By: Neita Garnet M.D.   On: 05/24/2023 20:30   CT Head Wo Contrast Result Date: 05/23/2023 CLINICAL DATA:  Head trauma, minor (Age >= 65y); Neck trauma (Age >= 65y) EXAM: CT HEAD WITHOUT CONTRAST CT CERVICAL SPINE WITHOUT CONTRAST TECHNIQUE: Multidetector CT imaging of the head and cervical spine was performed following the standard protocol without intravenous contrast. Multiplanar CT image reconstructions of the cervical spine were also generated. RADIATION DOSE REDUCTION: This exam was performed according to the departmental dose-optimization program which includes automated exposure control, adjustment of the mA and/or kV according to patient size and/or use of iterative reconstruction technique. COMPARISON:  Chest x-ray 05/22/2023 trauma CT head 05/08/2023 FINDINGS: CT HEAD FINDINGS Brain: Cerebral ventricle sizes are concordant with the degree of cerebral volume loss. Patchy and confluent areas of decreased  attenuation are noted throughout the deep and periventricular white matter of the cerebral hemispheres bilaterally, compatible with chronic microvascular ischemic disease. no evidence of large-territorial acute infarction. No parenchymal hemorrhage. No mass lesion. No extra-axial collection. No mass effect or midline shift. No hydrocephalus. Basilar cisterns are patent. Vascular: No hyperdense vessel. Atherosclerotic calcifications are present within the cavernous internal carotid arteries. Skull: No acute fracture or focal lesion. Sinuses/Orbits: Paranasal sinuses and mastoid air cells are clear. Bilateral lens replacement. Otherwise the orbits are unremarkable. Other: Right temporomandibular joint degenerative changes. CT CERVICAL SPINE FINDINGS Alignment: Reversal of normal cervical lordosis centered at the C3-C4 level likely due to positioning and degenerative changes. Skull base and vertebrae: Multilevel moderate degenerative changes spine. No acute fracture. No aggressive appearing focal osseous lesion or focal pathologic process. Soft tissues and spinal canal: No prevertebral fluid or swelling. No visible canal hematoma. Upper chest: Biapical interlobular septal wall thick. Emphysematous changes. Other: None. IMPRESSION: 1. No acute intracranial abnormality. 2. No acute displaced fracture or traumatic listhesis of the cervical spine. 3.  Emphysema (ICD10-J43.9). Electronically Signed   By: Tish Frederickson M.D.   On: 05/23/2023 00:12   CT Cervical Spine Wo Contrast Result Date: 05/23/2023 CLINICAL DATA:  Head trauma, minor (Age >= 65y); Neck trauma (Age >= 65y) EXAM: CT HEAD WITHOUT CONTRAST CT CERVICAL SPINE WITHOUT CONTRAST TECHNIQUE: Multidetector CT imaging of the head and cervical spine was performed following the standard protocol without intravenous contrast. Multiplanar CT image reconstructions of the cervical spine were also generated. RADIATION DOSE REDUCTION: This exam was performed according to  the departmental dose-optimization program which includes automated exposure control, adjustment of the mA and/or kV according to patient size and/or use of iterative reconstruction technique. COMPARISON:  Chest x-ray 05/22/2023 trauma CT head 05/08/2023 FINDINGS: CT HEAD FINDINGS Brain: Cerebral ventricle sizes are concordant with the degree of cerebral volume loss. Patchy and confluent areas of decreased attenuation are noted throughout the deep and periventricular white matter of the cerebral hemispheres bilaterally, compatible with chronic microvascular ischemic disease. no evidence of large-territorial acute infarction. No parenchymal hemorrhage. No mass lesion. No extra-axial collection. No mass effect or midline shift. No hydrocephalus. Basilar cisterns are patent. Vascular: No hyperdense vessel. Atherosclerotic calcifications are present within  the cavernous internal carotid arteries. Skull: No acute fracture or focal lesion. Sinuses/Orbits: Paranasal sinuses and mastoid air cells are clear. Bilateral lens replacement. Otherwise the orbits are unremarkable. Other: Right temporomandibular joint degenerative changes. CT CERVICAL SPINE FINDINGS Alignment: Reversal of normal cervical lordosis centered at the C3-C4 level likely due to positioning and degenerative changes. Skull base and vertebrae: Multilevel moderate degenerative changes spine. No acute fracture. No aggressive appearing focal osseous lesion or focal pathologic process. Soft tissues and spinal canal: No prevertebral fluid or swelling. No visible canal hematoma. Upper chest: Biapical interlobular septal wall thick. Emphysematous changes. Other: None. IMPRESSION: 1. No acute intracranial abnormality. 2. No acute displaced fracture or traumatic listhesis of the cervical spine. 3.  Emphysema (ICD10-J43.9). Electronically Signed   By: Tish Frederickson M.D.   On: 05/23/2023 00:12   DG Chest Port 1 View Result Date: 05/22/2023 CLINICAL DATA:  Hip  fracture, preop. EXAM: PORTABLE CHEST 1 VIEW COMPARISON:  Chest CT 05/08/2023 FINDINGS: The heart is upper normal in size. Diffuse aortic atherosclerosis. The lungs are clear. Pulmonary vasculature is normal. No consolidation, pleural effusion, or pneumothorax. No acute osseous abnormalities are seen. IMPRESSION: No active disease. Electronically Signed   By: Narda Rutherford M.D.   On: 05/22/2023 23:29   DG Hip Unilat W or Wo Pelvis 2-3 Views Right Result Date: 05/22/2023 CLINICAL DATA:  Status post fall with hip pain. EXAM: DG HIP (WITH OR WITHOUT PELVIS) 2-3V RIGHT COMPARISON:  None Available. FINDINGS: Displaced right femoral neck fracture. Mild proximal migration of the femoral shaft. Femoral head remains seated. The bones are diffusely under mineralized. Intact bony pelvis including pubic rami. There is retained barium within multiple colonic diverticula. Vascular calcifications are seen. IMPRESSION: Displaced right femoral neck fracture. Electronically Signed   By: Narda Rutherford M.D.   On: 05/22/2023 23:28   DG ESOPHAGUS W SINGLE CM (SOL OR THIN BA) Result Date: 05/10/2023 CLINICAL DATA:  Weight loss. Poor oral intake. Patient also reports globus sensation in the proximal esophagus. Request for barium esophagram. EXAM: ESOPHAGUS/BARIUM SWALLOW/TABLET STUDY TECHNIQUE: Single contrast examination was performed using thin liquid barium. This exam was performed by Brayton El PA-C , and was supervised and interpreted by Dr. Irish Lack . FLUOROSCOPY: Radiation Exposure Index (as provided by the fluoroscopic device): 14.90 mGy Kerma COMPARISON:  CT chest/abdomen 05/08/2023 FINDINGS: Swallowing: Appears normal. No vestibular penetration or aspiration seen. Pharynx: Unremarkable. Esophagus: Suggestion of moderate focal narrowing in the proximal esophagus. 13 mm tablet does not advanced beyond this level. Remaining esophagus with irregular and thickened mucosal folds concerning for esophagitis. Distal  esophagus also with long segment of smooth mild to moderate stricture. Esophageal motility: Limited evaluation of motility as patient could not lie prone. However, residual contrast stasis in the esophagus suggest some degree of dysmotility. Hiatal Hernia: None seen. Gastroesophageal reflux: None visualized. IMPRESSION: No evidence of aspiration or vestibular penetration. Moderate focal narrowing in the proximal esophagus that results in 13 mm tablet becoming lodged at this level. Irregular and thickened esophageal mucosa concerning for esophagitis. 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 Consider correlation with EGD. Electronically Signed   By: Irish Lack M.D.   On: 05/10/2023 16:23   CT CHEST ABDOMEN PELVIS WO CONTRAST Result Date: 05/08/2023 CLINICAL DATA:  Weight loss, unintended weight loss EXAM: CT CHEST, ABDOMEN AND PELVIS WITHOUT CONTRAST TECHNIQUE: Multidetector CT imaging of the chest, abdomen and pelvis was performed following the standard protocol without IV contrast. RADIATION DOSE REDUCTION: This exam  was performed according to the departmental dose-optimization program which includes automated exposure control, adjustment of the mA and/or kV according to patient size and/or use of iterative reconstruction technique. COMPARISON:  CT abdomen pelvis 10/23/2019 FINDINGS: CT CHEST FINDINGS Cardiovascular: Ascending thoracic aorta measures 4.2 cm. Proximal descending thoracic aorta measures 3.4 cm. Distal descending thoracic aorta measures 3.0 cm. Diffuse atherosclerotic calcifications in the thoracic aorta. Coronary artery calcifications. Mildly enlarged heart without pericardial effusion. 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 Left thyroid tissue is unremarkable. No significant lymph node enlargement in the chest but the limited evaluation without intravascular contrast. There is some oral contrast in the  esophagus. No axillary lymph node enlargement. Lungs/Pleura: Centrilobular emphysema. Motion artifact limits evaluation of the lungs but no significant airspace disease or lung consolidation. No large pleural effusions. No suspicious lung lesions. Musculoskeletal: Old fractures of left ninth and tenth ribs. No acute bone abnormality. CT ABDOMEN PELVIS FINDINGS Hepatobiliary: Normal appearance of the liver and gallbladder. Pancreas: Unremarkable. No pancreatic ductal dilatation or surrounding inflammatory changes. Spleen: Again noted is a fullness along the medial aspect of the spleen and there is concern for lesion in this area based on the previous contrast study from 2023. This area roughly measures 3.3 x 2.6 cm and previously measured 3.1 x 2.5 cm. Difficult to evaluate for interval change. Again noted are areas of cortical scarring in the right kidney. Limited evaluation due to motion artifact and streak artifact in the abdomen. Negative for hydronephrosis. Probable areas of cortical scarring in the left kidney are similar to the previous examination. Negative for kidney stones. Moderate amount of fluid in the urinary bladder. No bladder stones. Stomach/Bowel: Colonic diverticulosis without acute inflammation. No bowel dilatation or obstruction. Normal appearance of stomach. Vascular/Lymphatic: Abdominal aorta measures up to 2.9 cm. Diffuse atherosclerotic calcifications involving the abdominal aorta. No significant lymph node enlargement in the abdomen or pelvis. Iliac arteries are calcified. Reproductive: Status post hysterectomy. No adnexal masses. Again noted are calcifications in the bilateral adnexal regions. Other: Negative for free fluid. Negative for free air. Streak artifact in the abdomen due to an external metallic structure lateral to the right side of the abdomen. Musculoskeletal: No acute bone abnormality. IMPRESSION: 1. No acute abnormality in the chest, abdomen or pelvis. Limited examination due  to motion artifact and streak artifact. 2. Indeterminate splenic lesion is again noted. Difficult to evaluate for interval change. This area could be better characterized with postcontrast CT or MR. 3. 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 Recommend annual imaging followup by CTA or MRA. This recommendation follows 2010 ACCF/AHA/AATS/ACR/ASA/SCA/SCAI/SIR/STS/SVM Guidelines for the Diagnosis and Management of Patients with Thoracic Aortic Disease. Circulation. 2010; 121: Z610-R604. Aortic aneurysm NOS (ICD10-I71.9) 4. Colonic diverticulosis without acute inflammation. 5. Aortic Atherosclerosis (ICD10-I70.0) and Emphysema (ICD10-J43.9). Electronically Signed   By: Richarda Overlie M.D.   On: 05/08/2023 13:36   CT HEAD WO CONTRAST ( ) Result Date: 05/08/2023 CLINICAL DATA:  Neuro deficit, acute, stroke suspected. Weight loss and weakness. EXAM: CT HEAD WITHOUT CONTRAST TECHNIQUE: Contiguous axial images were obtained from the base of the skull through the vertex without intravenous contrast. RADIATION DOSE REDUCTION: This exam was performed according to the departmental dose-optimization program which includes automated exposure control, adjustment of the mA and/or kV according to patient size and/or use of iterative reconstruction technique. COMPARISON:  CT head without contrast 07/27/2015. FINDINGS: Brain: No acute infarct, hemorrhage, or mass lesion is present. Progressive atrophy and  white matter disease is moderately advanced for age. No acute infarct, hemorrhage, or mass lesion is present. The ventricles are proportionate to the degree of atrophy. No significant extraaxial fluid collection is present. The brainstem and cerebellum are within normal limits. Midline structures are within normal limits. Vascular: Atherosclerotic calcifications are present within the cavernous internal carotid arteries bilaterally. No hyperdense vessel is present. Skull: Calvarium is intact. No focal  lytic or blastic lesions are present. No significant extracranial soft tissue lesion is present. Sinuses/Orbits: The paranasal sinuses and mastoid air cells are clear. Bilateral lens replacements are noted. Globes and orbits are otherwise unremarkable. IMPRESSION: 1. No acute intracranial abnormality or significant interval change. 2. Progressive atrophy and white matter disease is moderately advanced for age. This likely reflects the sequela of chronic microvascular ischemia. Electronically Signed   By: Marin Roberts M.D.   On: 05/08/2023 10:49   ECHOCARDIOGRAM COMPLETE Result Date: 05/04/2023    ECHOCARDIOGRAM REPORT   Patient Name:   GRAINNE SCHLARB Date of Exam: 05/04/2023 Medical Rec #:  [id number]  Height:       62.0 in Accession #:    [id number] Weight:       90.0 lb Date of Birth:  [birth year]  BSA:          1.362 m Patient Age:    82 years   BP:           170/86 mmHg Patient Gender: F          HR:           75 bpm. Exam Location:  Inpatient Procedure: 2D Echo, Cardiac Doppler and Color Doppler Indications:    acute systolic chf.  History:        Patient has prior history of Echocardiogram examinations, most                 recent 01/08/2020. Aortic Valve Disease, Signs/Symptoms:Dyspnea;                 Risk Factors:Hypertension.  Sonographer:    Delcie Roch RDCS Referring Phys: [id number] SUBRINA SUNDIL IMPRESSIONS  1. Papillary muscle hypertrophy. Left ventricular ejection fraction, by estimation, is 45 to 50%. The left ventricle has mildly decreased function. The left ventricle demonstrates regional wall motion abnormalities (see scoring diagram/findings for description). 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 Left ventricular diastolic parameters are consistent with Grade II diastolic dysfunction (pseudonormalization). Basal function is preserved.  2. Right ventricular systolic function is normal. The right ventricular size is normal. Tricuspid  regurgitation signal is inadequate for assessing PA pressure.  3. Left atrial size was severely dilated.  4. Mitral valve leaflet thickening and doming without clear calcifications. The mitral valve is abnormal. Mild mitral valve regurgitation.  5. The aortic valve is calcified. Aortic valve regurgitation is mild to moderate. Moderate aortic valve stenosis. Aortic regurgitation PHT measures 306 msec. Aortic valve area, by VTI measures 1.02 cm. Aortic valve Vmax measures 3.02 m/s.  6. There is mild dilatation of the ascending aorta, measuring 39 mm. Mild when indexed to age, gender, and BSA.  7. The inferior vena cava is normal in size with greater than 50% respiratory variability, suggesting right atrial pressure of 3 mmHg. Comparison(s): Prior images unable to be directly viewed, comparison made by report only. FINDINGS  Left Ventricle: Papillary muscle hypertrophy. Left ventricular ejection fraction, by estimation, is 45 to 50%. The left ventricle has mildly decreased function. The left ventricle demonstrates regional wall motion  abnormalities. The left ventricular internal cavity size was normal in size. 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 Left ventricular diastolic parameters are consistent with Grade II diastolic dysfunction (pseudonormalization).  LV Wall Scoring: The mid and distal anterior wall, mid and distal lateral wall, mid and distal anterior septum, mid and distal inferior wall, mid anterolateral segment, and mid inferoseptal segment are hypokinetic. The basal anteroseptal segment, basal inferolateral segment, basal anterolateral segment, basal anterior segment, basal inferior segment, and basal inferoseptal segment are normal. Basal function is preserved. Right Ventricle: The right ventricular size is normal. No increase in right ventricular wall thickness. Right ventricular systolic function is normal. Tricuspid regurgitation signal is inadequate for  assessing PA pressure. Left Atrium: Left atrial size was severely dilated. Right Atrium: Right atrial size was normal in size. Pericardium: Trivial pericardial effusion is present. Presence of epicardial fat layer. Mitral Valve: Mitral valve leaflet thickening and doming without clear calcifications. The mitral valve is abnormal. There is mild thickening of the mitral valve leaflet(s). Mild mitral valve regurgitation. Tricuspid Valve: The tricuspid valve is normal in structure. Tricuspid valve regurgitation is trivial. No evidence of tricuspid stenosis. Aortic Valve: The aortic valve is calcified. Aortic valve regurgitation is mild to moderate. Aortic regurgitation PHT measures 306 msec. Moderate aortic stenosis is present. Aortic valve mean gradient measures 18.0 mmHg. Aortic valve peak gradient measures 36.5 mmHg. Aortic valve area, by VTI measures 1.02 cm. Pulmonic Valve: The pulmonic valve was normal in structure. Pulmonic valve regurgitation is mild. Aorta: The aortic root is normal in size and structure. There is mild dilatation of the ascending aorta, measuring 39 mm. Venous: The inferior vena cava is normal in size with greater than 50% respiratory variability, suggesting right atrial pressure of 3 mmHg. IAS/Shunts: No atrial level shunt detected by color flow Doppler.  LEFT VENTRICLE PLAX 2D LVIDd:         4.50 cm LVIDs:         2.50 cm LV PW:         1.60 cm LV IVS:        1.10 cm LVOT diam:     1.90 cm LV SV:         46 LV SV Index:   34 LVOT Area:     2.84 cm  LV Volumes (MOD) LV vol d, MOD A4C: 74.2 ml LV vol s, MOD A4C: 33.1 ml LV SV MOD A4C:     74.2 ml RIGHT VENTRICLE          IVC RV Basal diam:  2.60 cm  IVC diam: 1.40 cm LEFT ATRIUM              Index        RIGHT ATRIUM           Index LA diam:        3.40 cm  2.50 cm/m   RA Area:     11.60 cm LA Vol (A2C):   63.9 ml  46.93 ml/m  RA Volume:   23.80 ml  17.48 ml/m LA Vol (A4C):   103.0 ml 75.65 ml/m LA Biplane Vol: 88.0 ml  64.63 ml/m   AORTIC VALVE AV Area (Vmax):    0.88 cm AV Area (Vmean):   0.91 cm AV Area (VTI):     1.02 cm AV Vmax:           302.00 cm/s AV Vmean:          190.000  cm/s AV VTI:            0.452 m AV Peak Grad:      36.5 mmHg AV Mean Grad:      18.0 mmHg LVOT Vmax:         93.80 cm/s LVOT Vmean:        61.067 cm/s LVOT VTI:          0.163 m LVOT/AV VTI ratio: 0.36 AI PHT:            306 msec  AORTA Ao Root diam: 3.30 cm Ao Asc diam:  3.85 cm  SHUNTS Systemic VTI:  0.16 m Systemic Diam: 1.90 cm Riley Lam MD Electronically signed by Riley Lam MD Signature Date/Time: 05/04/2023/1:15:35 PM    Final    DG Chest Portable 1 View Result Date: 05/03/2023 CLINICAL DATA:  Shortness of breath EXAM: PORTABLE CHEST 1 VIEW COMPARISON:  10/22/2021 FINDINGS: Gross cardiomegaly. Mild diffuse bilateral interstitial pulmonary opacity. The visualized skeletal structures are unremarkable. IMPRESSION: Gross cardiomegaly with mild diffuse bilateral interstitial pulmonary opacity, consistent with edema or atypical/viral infection. No focal airspace opacity. Electronically Signed   By: Jearld Lesch M.D.   On: 05/03/2023 17:18    Microbiology: Recent Results (from the past 240 hours)  Surgical pcr screen     Status: None   Collection Time: 05/24/23  4:47 PM   Specimen: Nasal Mucosa; Nasal Swab  Result Value Ref Range Status   MRSA, PCR NEGATIVE NEGATIVE Final   Staphylococcus aureus NEGATIVE NEGATIVE Final    Comment: (NOTE) The Xpert SA Assay (FDA approved for NASAL specimens in patients 41 years of age and older), is one component of a comprehensive surveillance program. It is not intended to diagnose infection nor to guide or monitor treatment. Performed at [facility], [address], [place], [place] [address]      Labs: Basic Metabolic Panel: Recent Labs  Lab 05/23/23 0352 05/24/23 0443 05/25/23 0553 05/26/23 0649 05/27/23 0530  NA 138 135 136 136 132*  K 4.1 3.5 3.5 3.4* 4.4  CL  105 100 101 101 101  CO2 27 24 26 25 24   GLUCOSE 114* 122* 114* 92 109*  BUN 10 9 12 17 17   CREATININE 0.78 0.79 0.87 1.19* 1.07*  CALCIUM 8.9 9.0 8.6* 8.3* 8.4*  MG 1.6*  --   --  1.8 2.3   Liver Function Tests: No results for input(s): "AST", "ALT", "ALKPHOS", "BILITOT", "PROT", "ALBUMIN" in the last 168 hours. No results for input(s): "LIPASE", "AMYLASE" in the last 168 hours. No results for input(s): "AMMONIA" in the last 168 hours. CBC: Recent Labs  Lab 05/23/23 0015 05/23/23 0352 05/24/23 0443 05/25/23 0553 05/26/23 0649 05/27/23 0530  WBC 10.1 10.6* 9.9 7.5 9.0 7.5  NEUTROABS 8.2*  --   --   --   --   --   HGB 7.1* 7.1* 7.7* 7.1* 8.8* 9.0*  HCT 23.3* 23.5* 24.4* 22.2* 26.4* 27.4*  MCV 70.2* 70.1* 67.4* 65.9* 68.0* 68.7*  PLT 398 402* 441* 400 415* 420*   Cardiac Enzymes: No results for input(s): "CKTOTAL", "CKMB", "CKMBINDEX", "TROPONINI" in the last 168 hours. BNP: BNP (last 3 results) Recent Labs    05/03/23 1557  BNP 4,119.6*    ProBNP (last 3 results) No results for input(s): "PROBNP" in the last 8760 hours.  CBG: No results for input(s): "GLUCAP" in the last 168 hours.     Signed:  Ramiro Harvest MD.  [facility] 05/27/2023, 3:10  PM

## 2023-05-27 NOTE — TOC Progression Note (Addendum)
Transition of Care [facility]) - Progression Note    Patient Details  Name: Holly Guerra MRN: [id number] Date of Birth: 06-22-1940  Transition of Care [facility]) CM/SW Contact  Lorri Frederick, LCSW Phone Number: 05/27/2023, 8:57 AM  Clinical Narrative:   SNF auth request submitted in [place].    1430: Auth approved: [id number], [id number], 6 days: 1/16-1/21.    Waiting on confirmation from [facility] if they can receive pt today.  MD informed.   1550: [facility] confirmed can receive pt today.  MD informed.  CSW spoke with pt and son Arlys John, informed them of plan for DC.   Expected Discharge Plan: Skilled Nursing Facility Barriers to Discharge: Continued Medical Work up  Expected Discharge Plan and Services In-house Referral: Clinical Social Work   Post Acute Care Choice: Skilled Nursing Facility Living arrangements for the past 2 months: Single Family Home                                       Social Determinants of Health (SDOH) Interventions SDOH Screenings   Food Insecurity: No Food Insecurity (05/23/2023)  Housing: Low Risk  (05/23/2023)  Transportation Needs: No Transportation Needs (05/23/2023)  Utilities: Not At Risk (05/23/2023)  Alcohol Screen: Low Risk  (05/06/2023)  Financial Resource Strain: Low Risk  (05/06/2023)  Tobacco Use: High Risk (05/25/2023)    Readmission Risk Interventions     No data to display

## 2023-05-27 NOTE — Plan of Care (Signed)

## 2023-05-27 NOTE — Progress Notes (Signed)
Patient ID: Merriann Fogel [facility], female   DOB: [birth year], 83 y.o.   MRN: [id number] The patient is awake and alert.  She is quite pleasant to talk to.  Her right operative hip is stable.  Her dressing is clean and dry.  From an orthopedic standpoint, there are no further recommendations.  TOC is working on short-term skilled nursing placement.  I did put a prescription for pain medication on the chart.  She is back on Eliquis which she was on prior to surgery.

## 2023-05-28 ENCOUNTER — Ambulatory Visit: Payer: Medicare Other | Admitting: General Practice

## 2023-05-28 NOTE — Addendum Note (Signed)
Addendum  created 05/28/23 1027 by Dorie Rank, CRNA   Intraprocedure Meds edited

## 2023-05-29 LAB — BPAM RBC
Blood Product Expiration Date: 202501232359
Blood Product Expiration Date: 202501282359
Blood Product Expiration Date: 202501312359
Blood Product Expiration Date: 202502012359
ISSUE DATE / TIME: 202501141309
ISSUE DATE / TIME: 202501152300
Unit Type and Rh: 7300
Unit Type and Rh: 7300
Unit Type and Rh: 7300
Unit Type and Rh: 7300

## 2023-05-29 LAB — TYPE AND SCREEN
ABO/RH(D): B POS
Antibody Screen: NEGATIVE
Unit division: 0
Unit division: 0
Unit division: 0
Unit division: 0

## 2023-06-02 ENCOUNTER — Other Ambulatory Visit (HOSPITAL_COMMUNITY): Payer: Self-pay | Admitting: Cardiovascular Disease

## 2023-06-02 DIAGNOSIS — I739 Peripheral vascular disease, unspecified: Secondary | ICD-10-CM

## 2023-06-10 ENCOUNTER — Encounter: Payer: Self-pay | Admitting: Physician Assistant

## 2023-06-10 ENCOUNTER — Ambulatory Visit: Payer: Medicare Other | Admitting: Physician Assistant

## 2023-06-10 DIAGNOSIS — Z96649 Presence of unspecified artificial hip joint: Secondary | ICD-10-CM

## 2023-06-10 NOTE — Progress Notes (Signed)
HPI: [name] returns today status post left hip hemiarthroplasty.  She is at a skilled nursing facility.  States that she is walking approximately 200 feet with therapy.  She presents today in a wheelchair.  She said no fevers chills.  She is asking that her pain medicine be changed to tramadol instead of Norco.  She is on chronic anticoagulation.   Physical exam: General Well-developed well-nourished female seated in wheelchair. Right hip surgical incisions healing well.  No signs of dehiscence.  Incisions approximated with staples.  No signs of infection.  Right hip minimal discomfort with extreme internal/external rotation but fluid range of motion.  Good range of motion right knee.  Calf supple nontender dorsiflexion plantarflexion right ankle intact.  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  She is able to get the incision wet.  She will continue work with therapy to work on range of motion strengthening.  Also discussed with her quad strengthening exercises.  Recommend that her request for tramadol instead of Norco to be addressed by the facility.  She will follow-up with [place] in 1 month sooner if there is any questions concerns.  Questions were encouraged and answered

## 2023-06-21 NOTE — Progress Notes (Deleted)
 Cardiology Clinic Note   Patient Name: Holly Guerra Date of Encounter: 06/21/2023  Primary Care Provider:  Coralee Rud, PA-C Primary Cardiologist:  Nanetta Batty, MD  Patient Profile    Holly Guerra [facility] presents the clinic today for follow-up evaluation of her paroxysmal atrial fibrillation and chronic CHF.  Past Medical History    Past Medical History:  Diagnosis Date   Abdominal bruit    Abnormal echocardiogram    Anemia    Aortic insufficiency    Aortic root dilation (HCC)    Aortic valve disorder    Arthritis    "all over" (07/22/2012)   Arthropathy    Bilateral carotid artery stenosis    Carotid artery disease (HCC)    Chest discomfort    Edema    Exertional shortness of breath    Fatigue    GERD (gastroesophageal reflux disease)    Gout    Hypercholesteremia    Hypertension    Mitral regurgitation and aortic stenosis    Mixed hyperlipidemia    Murmur, cardiac    Peripheral vascular disease (HCC)    Pernicious anemia    PVD (peripheral vascular disease) (HCC)    Shortness of breath    Sickle-cell trait (HCC)    Thoracic aortic aneurysm (HCC)    Tobacco use    URI (upper respiratory infection)    Past Surgical History:  Procedure Laterality Date   ABDOMINAL AORTOGRAM W/LOWER EXTREMITY Bilateral 05/23/2020   Procedure: ABDOMINAL AORTOGRAM W/LOWER EXTREMITY;  Surgeon: Runell Gess, MD;  Location: [facility] INVASIVE CV LAB;  Service: Cardiovascular;  Laterality: Bilateral;   ABDOMINAL HYSTERECTOMY  1970's   APPENDECTOMY  1970's   CARDIOVASCULAR STRESS TEST  06/16/2012   normal myocardial perfusion imaging, LV systolic function was normal without regional wall motion abnormalities, LV EF 60%   DOPPLER ECHOCARDIOGRAPHY  06/16/2012   EF 60-65%, mild concetric LVH, moderate aortic regurg   ESOPHAGOGASTRODUODENOSCOPY (EGD) WITH PROPOFOL N/A 05/15/2023   Procedure: ESOPHAGOGASTRODUODENOSCOPY (EGD) WITH PROPOFOL;  Surgeon: Jeani Hawking, MD;  Location: [facility] ENDOSCOPY;   Service: Gastroenterology;  Laterality: N/A;   HIP ARTHROPLASTY Right 05/25/2023   Procedure: ARTHROPLASTY BIPOLAR HIP (HEMIARTHROPLASTY);  Surgeon: Kathryne Hitch, MD;  Location: [facility] OR;  Service: Orthopedics;  Laterality: Right;   LOWER EXTREMITY ANGIOGRAM N/A 07/22/2012   Procedure: LOWER EXTREMITY ANGIOGRAM;  Surgeon: Runell Gess, MD;  Location: [facility] CATH LAB;  Service: Cardiovascular;  Laterality: N/A;   LOWER EXTREMITY ARTERIAL DOPPLER  08/08/2012   Left SFA stent-open and patent without evidence of hemodynamically significant stenosis   PERCUTANEOUS STENT INTERVENTION Left 07/22/2012   SAVORY DILATION N/A 05/15/2023   Procedure: SAVORY DILATION;  Surgeon: Jeani Hawking, MD;  Location: [facility] ENDOSCOPY;  Service: Gastroenterology;  Laterality: N/A;  16mm   TOTAL THYROIDECTOMY  ~ 2010    Allergies  Allergies  Allergen Reactions   Lactose Intolerance (Gi) Diarrhea    History of Present Illness    Holly Guerra has a PMH of HTN, PVD, tobacco abuse, microcytic anemia, chronic anticoagulation (Eliquis), paroxysmal atrial fibrillation 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  Her PMH also includes solid dysphagia status post EGD with dilation.  She is followed by Dr. Gery Pray for her peripheral arterial disease.  She had last full intervention by Dr. Imogene Burn of her right SFA 08/01/2019.  She reported right calf lifestyle limiting claudication.  She previously had intervention of left SFA for CTO 07/22/2012.  Her lower extremity Dopplers 11/21 showed right ABI of 0.51 and left  of 1.08.  Her right SFA was noted to have one-vessel runoff via peroneal.  Dr. Imogene Burn recommended femoropopliteal bypass grafting after failed attempt at right SFA revascularization.  She had been started on Pletal without benefit.  She was seen in follow-up by Dr. Gery Pray on 06/14/2020.  She had undergone peripheral angiography 05/23/2020 which showed patent left SFA.  She was noted to have 80% calcified left common femoral artery stenosis.  Her  right SFA was occluded at its origin.  She was noted to have profundofemoral collaterals with 90% right popliteal artery stenosis and one-vessel runoff via the peroneal.  It was not felt that she had options for percutaneous revascularization or was a candidate for bypass.  She was lost to follow-up.  She was seen in the emergency department after having a fall and diagnosed with right femoral neck fracture.  This was a result of mechanical fall.  She underwent hemiarthroplasty 05/25/2023.  She was discharged to skilled nursing facility.   Peripheral arterial disease-denies lower extremity claudication.  Reports compliance with statin therapy and Plavix. Continue atorvastatin, clopidogrel Heart healthy low-sodium diet Repeat lower extremity ABIs and LEA's  HFrEF-EF 41-49% on last echocardiogram.  Well compensated. Continue current medication regimen Heart healthy low-sodium diet  Paroxysmal atrial fibrillation-heart rate today***.  Reports compliance with apixaban.  Denies bleeding issues. Avoid triggers caffeine, chocolate, EtOH, dehydration etc. Continue Plavix, clopidogrel, atorvastatin, metoprolol  Tobacco abuse-tobacco cessation strongly encouraged. Tobacco cessation instructions provided to family.  Disposition: Follow-up with Dr.Berry or me in 6 months.       Home Medications    Prior to Admission medications   Medication Sig Start Date End Date Taking? Authorizing Provider  acetaminophen (TYLENOL) 500 MG tablet Take 1,000 mg by mouth 3 (three) times daily. May take 2 tablets four times a day as needed    [provider]  allopurinol (ZYLOPRIM) 100 MG tablet Take 1 tablet (100 mg total) by mouth every evening. 05/19/23   Hughie Closs, MD  apixaban (ELIQUIS) 2.5 MG TABS tablet Take 1 tablet (2.5 mg total) by mouth 2 (two) times daily. 05/19/23 06/18/23  Hughie Closs, MD  ascorbic acid (VITAMIN C) 500 MG tablet Take 500 mg by mouth daily.    [provider]   atorvastatin (LIPITOR) 40 MG tablet Take 1 tablet (40 mg total) by mouth daily. 05/19/23 05/18/24  Hughie Closs, MD  busPIRone (BUSPAR) 5 MG tablet Take 1 tablet (5 mg total) by mouth 3 (three) times daily. 05/19/23 06/18/23  Hughie Closs, MD  clopidogrel (PLAVIX) 75 MG tablet Take 1 tablet (75 mg total) by mouth daily. 07/24/12   Robbie Lis M, PA-C  cyanocobalamin (,VITAMIN B-12,) 1000 MCG/ML injection Inject 1,000 mg into the muscle every 30 (thirty) days. 03/08/20   [provider]  cycloSPORINE (RESTASIS) 0.05 % ophthalmic emulsion Place 1 drop into both eyes 2 (two) times daily.    [provider]  Hydrogen Peroxide 3 % MISC Take 10 mLs by mouth 3 (three) times daily as needed (Oral Rinse). Mix 10ml peroxide with 10ml water for an oral rinse. Swish and spit out    [provider]  Iron-Vitamins (GERITOL) LIQD Take 15 mLs by mouth daily.    [provider]  metoprolol tartrate (LOPRESSOR) 25 MG tablet Take 0.5 tablets (12.5 mg total) by mouth 2 (two) times daily. 05/19/23 06/18/23  Hughie Closs, MD  nicotine (NICODERM CQ - DOSED IN MG/24 HOURS) 21 mg/24hr patch Place 21 mg onto the skin daily.  [provider]  omeprazole (PRILOSEC) 40 MG capsule Take 40 mg by mouth 2 (two) times daily.    [provider]  sucralfate (CARAFATE) 1 GM/10ML suspension Take 10 mLs (1 g total) by mouth every 8 (eight) hours for 6 days. Patient taking differently: Take 1 g by mouth every 8 (eight) hours. USE FOR 5 DAYS 05/19/23 05/25/23  Hughie Closs, MD  traMADol (ULTRAM) 50 MG tablet Take 1 tablet (50 mg total) by mouth every 8 (eight) hours as needed (pain). 05/17/23   Hughie Closs, MD  triamcinolone (KENALOG) 0.1 % paste Use as directed 1 Application in the mouth or throat 3 (three) times daily.    [provider]    Family History    Family History  Problem Relation Age of Onset   Hypertension Mother    Heart disease Mother    She indicated that  the status of her mother is unknown.  Social History    Social History   Socioeconomic History   Marital status: Widowed    Spouse name: Not on file   Number of children: 2   Years of education: Not on file   Highest education level: Not on file  Occupational History   Occupation: retired  Tobacco Use   Smoking status: Every Day    Current packs/day: 0.00    Average packs/day: 0.5 packs/day for 45.0 years (22.5 ttl pk-yrs)    Types: Cigarettes    Start date: 05/11/1962    Last attempt to quit: 05/12/2007    Years since quitting: 16.1   Smokeless tobacco: Never   Tobacco comments:    Pt states she smokes "6-7 a day"  Vaping Use   Vaping status: Never Used  Substance and Sexual Activity   Alcohol use: Yes    Comment: "3 beers a week"   Drug use: No   Sexual activity: Not on file  Other Topics Concern   Not on file  Social History Narrative   Not on file   Social Drivers of Health   Financial Resource Strain: Low Risk  (05/06/2023)   Overall Financial Resource Strain (CARDIA)    Difficulty of Paying Living Expenses: Not very hard  Food Insecurity: No Food Insecurity (05/23/2023)   Hunger Vital Sign    Worried About Running Out of Food in the Last Year: Never true    Ran Out of Food in the Last Year: Never true  Transportation Needs: No Transportation Needs (05/23/2023)   PRAPARE - Administrator, Civil Service (Medical): No    Lack of Transportation (Non-Medical): No  Physical Activity: Not on file  Stress: Not on file  Social Connections: Not on file  Intimate Partner Violence: Not At Risk (05/23/2023)   Humiliation, Afraid, Rape, and Kick questionnaire    Fear of Current or Ex-Partner: No    Emotionally Abused: No    Physically Abused: No    Sexually Abused: No     Review of Systems    General:  No chills, fever, night sweats or weight changes.  Cardiovascular:  No chest pain, dyspnea on exertion, edema, orthopnea, palpitations, paroxysmal nocturnal  dyspnea. Dermatological: No rash, lesions/masses Respiratory: No cough, dyspnea Urologic: No hematuria, dysuria Abdominal:   No nausea, vomiting, diarrhea, bright red blood per rectum, melena, or hematemesis Neurologic:  No visual changes, wkns, changes in mental status. All other systems reviewed and are otherwise negative except as noted above.  Physical Exam    VS:  There were  no vitals taken for this visit. , BMI There is no height or weight on file to calculate BMI. GEN: Well nourished, well developed, in no acute distress. HEENT: normal. Neck: Supple, no JVD, carotid bruits, or masses. Cardiac: RRR, no murmurs, rubs, or gallops. No clubbing, cyanosis, edema.  Radials/DP/PT 2+ and equal bilaterally.  Respiratory:  Respirations regular and unlabored, clear to auscultation bilaterally. GI: Soft, nontender, nondistended, BS + x 4. MS: no deformity or atrophy. Skin: warm and dry, no rash. Neuro:  Strength and sensation are intact. Psych: Normal affect.  Accessory Clinical Findings    Recent Labs: 05/03/2023: B Natriuretic Peptide 4,119.6 05/13/2023: ALT 30 05/27/2023: BUN 17; Creatinine, Ser 1.07; Hemoglobin 9.0; Magnesium 2.3; Platelets 420; Potassium 4.4; Sodium 132   Recent Lipid Panel No results found for: "CHOL", "TRIG", "HDL", "CHOLHDL", "VLDL", "LDLCALC", "LDLDIRECT"  No BP recorded.  {Refresh Note OR Click here to enter BP  :1}***    ECG personally reviewed by me today- ***    Echocardiogram 05/04/2023  IMPRESSIONS     1. Papillary muscle hypertrophy. Left ventricular ejection fraction, by  estimation, is 45 to 50%. The left ventricle has mildly decreased  function. The left ventricle demonstrates regional wall motion  abnormalities (see scoring diagram/findings for  description). There is severe concentric left ventricular hypertrophy of  the inferior segment. Left ventricular diastolic parameters are consistent  with Grade II diastolic dysfunction  (pseudonormalization). Basal function  is preserved.   2. Right ventricular systolic function is normal. The right ventricular  size is normal. Tricuspid regurgitation signal is inadequate for assessing  PA pressure.   3. Left atrial size was severely dilated.   4. Mitral valve leaflet thickening and doming without clear  calcifications. The mitral valve is abnormal. Mild mitral valve  regurgitation.   5. The aortic valve is calcified. Aortic valve regurgitation is mild to  moderate. Moderate aortic valve stenosis. Aortic regurgitation PHT  measures 306 msec. Aortic valve area, by VTI measures 1.02 cm. Aortic  valve Vmax measures 3.02 m/s.   6. There is mild dilatation of the ascending aorta, measuring 39 mm. Mild  when indexed to age, gender, and BSA.   7. The inferior vena cava is normal in size with greater than 50%  respiratory variability, suggesting right atrial pressure of 3 mmHg.   Comparison(s): Prior images unable to be directly viewed, comparison made  by report only.   FINDINGS   Left Ventricle: Papillary muscle hypertrophy. Left ventricular ejection  fraction, by estimation, is 45 to 50%. The left ventricle has mildly  decreased function. The left ventricle demonstrates regional wall motion  abnormalities. The left ventricular  internal cavity size was normal in size. There is severe concentric left  ventricular hypertrophy of the inferior segment. Left ventricular  diastolic parameters are consistent with Grade II diastolic dysfunction  (pseudonormalization).     LV Wall Scoring:  The mid and distal anterior wall, mid and distal lateral wall, mid and  distal  anterior septum, mid and distal inferior wall, mid anterolateral segment,  and  mid inferoseptal segment are hypokinetic. The basal anteroseptal segment,  basal inferolateral segment, basal anterolateral segment, basal anterior  segment, basal inferior segment, and basal inferoseptal segment are  normal.   Basal function is preserved.   Right Ventricle: The right ventricular size is normal. No increase in  right ventricular wall thickness. Right ventricular systolic function is  normal. Tricuspid regurgitation signal is inadequate for assessing PA  pressure.   Left Atrium:  Left atrial size was severely dilated.   Right Atrium: Right atrial size was normal in size.   Pericardium: Trivial pericardial effusion is present. Presence of  epicardial fat layer.   Mitral Valve: Mitral valve leaflet thickening and doming without clear  calcifications. The mitral valve is abnormal. There is mild thickening of  the mitral valve leaflet(s). Mild mitral valve regurgitation.   Tricuspid Valve: The tricuspid valve is normal in structure. Tricuspid  valve regurgitation is trivial. No evidence of tricuspid stenosis.   Aortic Valve: The aortic valve is calcified. Aortic valve regurgitation is  mild to moderate. Aortic regurgitation PHT measures 306 msec. Moderate  aortic stenosis is present. Aortic valve mean gradient measures 18.0 mmHg.  Aortic valve peak gradient  measures 36.5 mmHg. Aortic valve area, by VTI measures 1.02 cm.   Pulmonic Valve: The pulmonic valve was normal in structure. Pulmonic valve  regurgitation is mild.   Aorta: The aortic root is normal in size and structure. There is mild  dilatation of the ascending aorta, measuring 39 mm.   Venous: The inferior vena cava is normal in size with greater than 50%  respiratory variability, suggesting right atrial pressure of 3 mmHg.   IAS/Shunts: No atrial level shunt detected by color flow Doppler.        Assessment & Plan   1.  ***   Thomasene Ripple. Zofia Peckinpaugh NP-C     06/21/2023, 9:25 AM [facility] [address] Office [phone] Fax [phone]    I spent***minutes examining this patient, reviewing medications, and using patient centered shared decision making involving their  cardiac care.   I spent  20 minutes reviewing  past medical history,  medications, and prior cardiac tests.

## 2023-06-23 ENCOUNTER — Ambulatory Visit (HOSPITAL_COMMUNITY)
Admission: RE | Admit: 2023-06-23 | Discharge: 2023-06-23 | Disposition: A | Discharge status: Home / Self Care | Payer: Medicare Other | Source: Ambulatory Visit | Attending: Cardiovascular Disease | Admitting: Cardiovascular Disease

## 2023-06-23 ENCOUNTER — Ambulatory Visit: Payer: Medicare Other | Admitting: General Practice

## 2023-06-23 DIAGNOSIS — I739 Peripheral vascular disease, unspecified: Secondary | ICD-10-CM | POA: Insufficient documentation

## 2023-06-24 ENCOUNTER — Telehealth: Payer: Self-pay | Admitting: Cardiovascular Disease

## 2023-06-24 LAB — VAS US ABI WITH/WO TBI
Left ABI: 1.02
Right ABI: 0.54

## 2023-06-24 NOTE — Telephone Encounter (Signed)
Holly Guerra, Holly Guerra  P Cv Div Nl Scheduling Patient left without been seen. Can you please call the patient to get them rescheduled.   Received staff message above, patients daughter states that she was told they were good to go after her vascular study and no need for patient to keep 11:20am appt with Edd Fabian. Patient rescheduled for 02/18 with Edd Fabian at 2:45pm

## 2023-06-25 NOTE — Progress Notes (Signed)
Cardiology Clinic Note   Patient Name: Holly Guerra [facility] Date of Encounter: 06/29/2023  Primary Care Provider:  Coralee Rud, PA-C Primary Cardiologist:  Nanetta Batty, MD  Patient Profile    Holly Guerra [facility] presents the clinic today for follow-up evaluation of her paroxysmal atrial fibrillation and chronic CHF.  Past Medical History    Past Medical History:  Diagnosis Date   Abdominal bruit    Abnormal echocardiogram    Anemia    Aortic insufficiency    Aortic root dilation (HCC)    Aortic valve disorder    Arthritis    "all over" (07/22/2012)   Arthropathy    Bilateral carotid artery stenosis    Carotid artery disease (HCC)    Chest discomfort    Edema    Exertional shortness of breath    Fatigue    GERD (gastroesophageal reflux disease)    Gout    Hypercholesteremia    Hypertension    Mitral regurgitation and aortic stenosis    Mixed hyperlipidemia    Murmur, cardiac    Peripheral vascular disease (HCC)    Pernicious anemia    PVD (peripheral vascular disease) (HCC)    Shortness of breath    Sickle-cell trait (HCC)    Thoracic aortic aneurysm (HCC)    Tobacco use    URI (upper respiratory infection)    Past Surgical History:  Procedure Laterality Date   ABDOMINAL AORTOGRAM W/LOWER EXTREMITY Bilateral 05/23/2020   Procedure: ABDOMINAL AORTOGRAM W/LOWER EXTREMITY;  Surgeon: Runell Gess, MD;  Location: [facility] INVASIVE CV LAB;  Service: Cardiovascular;  Laterality: Bilateral;   ABDOMINAL HYSTERECTOMY  1970's   APPENDECTOMY  1970's   CARDIOVASCULAR STRESS TEST  06/16/2012   normal myocardial perfusion imaging, LV systolic function was normal without regional wall motion abnormalities, LV EF 60%   DOPPLER ECHOCARDIOGRAPHY  06/16/2012   EF 60-65%, mild concetric LVH, moderate aortic regurg   ESOPHAGOGASTRODUODENOSCOPY (EGD) WITH PROPOFOL N/A 05/15/2023   Procedure: ESOPHAGOGASTRODUODENOSCOPY (EGD) WITH PROPOFOL;  Surgeon: Jeani Hawking, MD;  Location: [facility] ENDOSCOPY;   Service: Gastroenterology;  Laterality: N/A;   HIP ARTHROPLASTY Right 05/25/2023   Procedure: ARTHROPLASTY BIPOLAR HIP (HEMIARTHROPLASTY);  Surgeon: Kathryne Hitch, MD;  Location: [facility] OR;  Service: Orthopedics;  Laterality: Right;   LOWER EXTREMITY ANGIOGRAM N/A 07/22/2012   Procedure: LOWER EXTREMITY ANGIOGRAM;  Surgeon: Runell Gess, MD;  Location: [facility] CATH LAB;  Service: Cardiovascular;  Laterality: N/A;   LOWER EXTREMITY ARTERIAL DOPPLER  08/08/2012   Left SFA stent-open and patent without evidence of hemodynamically significant stenosis   PERCUTANEOUS STENT INTERVENTION Left 07/22/2012   SAVORY DILATION N/A 05/15/2023   Procedure: SAVORY DILATION;  Surgeon: Jeani Hawking, MD;  Location: [facility] ENDOSCOPY;  Service: Gastroenterology;  Laterality: N/A;  16mm   TOTAL THYROIDECTOMY  ~ 2010    Allergies  Allergies  Allergen Reactions   Lactose Intolerance (Gi) Diarrhea    History of Present Illness    Holly Guerra has a PMH of HTN, PVD, tobacco abuse, microcytic anemia, chronic anticoagulation (Eliquis), paroxysmal atrial fibrillation 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  Her PMH also includes solid dysphagia status post EGD with dilation.  She is followed by Dr. Gery Pray for her peripheral arterial disease.  She had last full intervention by Dr. Imogene Burn of her right SFA 08/01/2019.  She reported right calf lifestyle limiting claudication.  She previously had intervention of left SFA for CTO 07/22/2012.  Her lower extremity Dopplers 11/21 showed right ABI of 0.51 and left  of 1.08.  Her right SFA was noted to have one-vessel runoff via peroneal.  Dr. Imogene Burn recommended femoropopliteal bypass grafting after failed attempt at right SFA revascularization.  She had been started on Pletal without benefit.  She was seen in follow-up by Dr. Allyson Sabal on 06/14/2020.  She had undergone peripheral angiography 05/23/2020 which showed patent left SFA.  She was noted to have 80% calcified left common femoral artery stenosis.  Her  right SFA was occluded at its origin.  She was noted to have profundofemoral collaterals with 90% right popliteal artery stenosis and one-vessel runoff via the peroneal.  It was not felt that she had options for percutaneous revascularization or was a candidate for bypass.  She was lost to follow-up.  She was seen in the emergency department after having a fall and diagnosed with right femoral neck fracture.  This was a result of mechanical fall.  She underwent hemiarthroplasty 05/25/2023.  She was discharged to skilled nursing facility.   She presents to the clinic today for follow-up evaluation and states that she is feeling much better since being in the hospital.  She graduated from physical therapy.  I encouraged her to continue her physical therapy activities at home.  She has gone back to smoking and is now smoking less than half pack per day.  I will represcribe her nicotine patches.  Her blood pressure today is 138/78 and her pulse is 97.  She has noticed some palpitations in the morning that last for about an hour and then dissipate without intervention.  I will increase her metoprolol to 25 mg in the morning and keep the 12 and half milligram dosing in the p.m.  We reviewed her echocardiogram from December.  She has a 3/6 systolic murmur heard along right sternal border.  Will plan follow-up in 4 to 6 months.  Today she denies chest pain, shortness of breath, lower extremity edema, fatigue, palpitations, melena, hematuria, hemoptysis, diaphoresis, weakness, presyncope, syncope, orthopnea, and PND.        Home Medications    Prior to Admission medications   Medication Sig Start Date End Date Taking? Authorizing Provider  acetaminophen (TYLENOL) 500 MG tablet Take 1,000 mg by mouth 3 (three) times daily. May take 2 tablets four times a day as needed    [provider]  allopurinol (ZYLOPRIM) 100 MG tablet Take 1 tablet (100 mg total) by mouth every evening. 05/19/23   Hughie Closs, MD  apixaban (ELIQUIS) 2.5 MG TABS tablet Take 1 tablet (2.5 mg total) by mouth 2 (two) times daily. 05/19/23 06/18/23  Hughie Closs, MD  ascorbic acid (VITAMIN C) 500 MG tablet Take 500 mg by mouth daily.    [provider]  atorvastatin (LIPITOR) 40 MG tablet Take 1 tablet (40 mg total) by mouth daily. 05/19/23 05/18/24  Hughie Closs, MD  busPIRone (BUSPAR) 5 MG tablet Take 1 tablet (5 mg total) by mouth 3 (three) times daily. 05/19/23 06/18/23  Hughie Closs, MD  clopidogrel (PLAVIX) 75 MG tablet Take 1 tablet (75 mg total) by mouth daily. 07/24/12   Robbie Lis M, PA-C  cyanocobalamin (,VITAMIN B-12,) 1000 MCG/ML injection Inject 1,000 mg into the muscle every 30 (thirty) days. 03/08/20   [provider]  cycloSPORINE (RESTASIS) 0.05 % ophthalmic emulsion Place 1 drop into both eyes 2 (two) times daily.    [provider]  Hydrogen Peroxide 3 % MISC Take 10 mLs by mouth 3 (three) times daily as needed (Oral Rinse). Mix 10ml  peroxide with 10ml water for an oral rinse. Swish and spit out    [provider]  Iron-Vitamins (GERITOL) LIQD Take 15 mLs by mouth daily.    [provider]  metoprolol tartrate (LOPRESSOR) 25 MG tablet Take 0.5 tablets (12.5 mg total) by mouth 2 (two) times daily. 05/19/23 06/18/23  Hughie Closs, MD  nicotine (NICODERM CQ - DOSED IN MG/24 HOURS) 21 mg/24hr patch Place 21 mg onto the skin daily.    [provider]  omeprazole (PRILOSEC) 40 MG capsule Take 40 mg by mouth 2 (two) times daily.    [provider]  sucralfate (CARAFATE) 1 GM/10ML suspension Take 10 mLs (1 g total) by mouth every 8 (eight) hours for 6 days. Patient taking differently: Take 1 g by mouth every 8 (eight) hours. USE FOR 5 DAYS 05/19/23 05/25/23  Hughie Closs, MD  traMADol (ULTRAM) 50 MG tablet Take 1 tablet (50 mg total) by mouth every 8 (eight) hours as needed (pain). 05/17/23   Hughie Closs, MD  triamcinolone (KENALOG) 0.1 % paste Use as  directed 1 Application in the mouth or throat 3 (three) times daily.    [provider]    Family History    Family History  Problem Relation Age of Onset   Hypertension Mother    Heart disease Mother    She indicated that the status of her mother is unknown.  Social History    Social History   Socioeconomic History   Marital status: Widowed    Spouse name: Not on file   Number of children: 2   Years of education: Not on file   Highest education level: Not on file  Occupational History   Occupation: retired  Tobacco Use   Smoking status: Every Day    Current packs/day: 0.00    Average packs/day: 0.5 packs/day for 45.0 years (22.5 ttl pk-yrs)    Types: Cigarettes    Start date: 05/11/1962    Last attempt to quit: 05/12/2007    Years since quitting: 16.1   Smokeless tobacco: Never   Tobacco comments:    Pt states she smokes "6-7 a day"  Vaping Use   Vaping status: Never Used  Substance and Sexual Activity   Alcohol use: Yes    Comment: "3 beers a week"   Drug use: No   Sexual activity: Not on file  Other Topics Concern   Not on file  Social History Narrative   Not on file   Social Drivers of Health   Financial Resource Strain: Low Risk  (05/06/2023)   Overall Financial Resource Strain (CARDIA)    Difficulty of Paying Living Expenses: Not very hard  Food Insecurity: No Food Insecurity (05/23/2023)   Hunger Vital Sign    Worried About Running Out of Food in the Last Year: Never true    Ran Out of Food in the Last Year: Never true  Transportation Needs: No Transportation Needs (05/23/2023)   PRAPARE - Administrator, Civil Service (Medical): No    Lack of Transportation (Non-Medical): No  Physical Activity: Not on file  Stress: Not on file  Social Connections: Not on file  Intimate Partner Violence: Not At Risk (05/23/2023)   Humiliation, Afraid, Rape, and Kick questionnaire    Fear of Current or Ex-Partner: No    Emotionally Abused: No     Physically Abused: No    Sexually Abused: No     Review of Systems    General:  No  chills, fever, night sweats or weight changes.  Cardiovascular:  No chest pain, dyspnea on exertion, edema, orthopnea, palpitations, paroxysmal nocturnal dyspnea. Dermatological: No rash, lesions/masses Respiratory: No cough, dyspnea Urologic: No hematuria, dysuria Abdominal:   No nausea, vomiting, diarrhea, bright red blood per rectum, melena, or hematemesis Neurologic:  No visual changes, wkns, changes in mental status. All other systems reviewed and are otherwise negative except as noted above.  Physical Exam    VS:  BP 138/78 (BP Location: Right Arm, Patient Position: Sitting, Cuff Size: Normal)   Pulse 97   Ht 5\' 2"  (1.575 m)   Wt 109 lb (49.4 kg)   BMI 19.94 kg/m  , BMI Body mass index is 19.94 kg/m. GEN: Well nourished, well developed, in no acute distress. HEENT: normal. Neck: Supple, no JVD, carotid bruits, or masses. Cardiac: RRR, 3/6 systolic murmur heard along right sternal border, rubs, or gallops. No clubbing, cyanosis, edema.  Radials/DP/PT 2+ and equal bilaterally.  Respiratory:  Respirations regular and unlabored, clear to auscultation bilaterally. GI: Soft, nontender, nondistended, BS + x 4. MS: no deformity or atrophy. Skin: warm and dry, no rash. Neuro:  Strength and sensation are intact. Psych: Normal affect.  Accessory Clinical Findings    Recent Labs: 05/03/2023: B Natriuretic Peptide 4,119.6 05/13/2023: ALT 30 05/27/2023: BUN 17; Creatinine, Ser 1.07; Hemoglobin 9.0; Magnesium 2.3; Platelets 420; Potassium 4.4; Sodium 132   Recent Lipid Panel No results found for: "CHOL", "TRIG", "HDL", "CHOLHDL", "VLDL", "LDLCALC", "LDLDIRECT"       ECG personally reviewed by me today-none today.    Echocardiogram 05/04/2023  IMPRESSIONS     1. Papillary muscle hypertrophy. Left ventricular ejection fraction, by  estimation, is 45 to 50%. The left ventricle has mildly  decreased  function. The left ventricle demonstrates regional wall motion  abnormalities (see scoring diagram/findings for  description). There is severe concentric left ventricular hypertrophy of  the inferior segment. Left ventricular diastolic parameters are consistent  with Grade II diastolic dysfunction (pseudonormalization). Basal function  is preserved.   2. Right ventricular systolic function is normal. The right ventricular  size is normal. Tricuspid regurgitation signal is inadequate for assessing  PA pressure.   3. Left atrial size was severely dilated.   4. Mitral valve leaflet thickening and doming without clear  calcifications. The mitral valve is abnormal. Mild mitral valve  regurgitation.   5. The aortic valve is calcified. Aortic valve regurgitation is mild to  moderate. Moderate aortic valve stenosis. Aortic regurgitation PHT  measures 306 msec. Aortic valve area, by VTI measures 1.02 cm. Aortic  valve Vmax measures 3.02 m/s.   6. There is mild dilatation of the ascending aorta, measuring 39 mm. Mild  when indexed to age, gender, and BSA.   7. The inferior vena cava is normal in size with greater than 50%  respiratory variability, suggesting right atrial pressure of 3 mmHg.   Comparison(s): Prior images unable to be directly viewed, comparison made  by report only.   FINDINGS   Left Ventricle: Papillary muscle hypertrophy. Left ventricular ejection  fraction, by estimation, is 45 to 50%. The left ventricle has mildly  decreased function. The left ventricle demonstrates regional wall motion  abnormalities. The left ventricular  internal cavity size was normal in size. There is severe concentric left  ventricular hypertrophy of the inferior segment. Left ventricular  diastolic parameters are consistent with Grade II diastolic dysfunction  (pseudonormalization).     LV Wall Scoring:  The mid and distal anterior wall,  mid and distal lateral wall, mid and  distal   anterior septum, mid and distal inferior wall, mid anterolateral segment,  and  mid inferoseptal segment are hypokinetic. The basal anteroseptal segment,  basal inferolateral segment, basal anterolateral segment, basal anterior  segment, basal inferior segment, and basal inferoseptal segment are  normal.  Basal function is preserved.   Right Ventricle: The right ventricular size is normal. No increase in  right ventricular wall thickness. Right ventricular systolic function is  normal. Tricuspid regurgitation signal is inadequate for assessing PA  pressure.   Left Atrium: Left atrial size was severely dilated.   Right Atrium: Right atrial size was normal in size.   Pericardium: Trivial pericardial effusion is present. Presence of  epicardial fat layer.   Mitral Valve: Mitral valve leaflet thickening and doming without clear  calcifications. The mitral valve is abnormal. There is mild thickening of  the mitral valve leaflet(s). Mild mitral valve regurgitation.   Tricuspid Valve: The tricuspid valve is normal in structure. Tricuspid  valve regurgitation is trivial. No evidence of tricuspid stenosis.   Aortic Valve: The aortic valve is calcified. Aortic valve regurgitation is  mild to moderate. Aortic regurgitation PHT measures 306 msec. Moderate  aortic stenosis is present. Aortic valve mean gradient measures 18.0 mmHg.  Aortic valve peak gradient  measures 36.5 mmHg. Aortic valve area, by VTI measures 1.02 cm.   Pulmonic Valve: The pulmonic valve was normal in structure. Pulmonic valve  regurgitation is mild.   Aorta: The aortic root is normal in size and structure. There is mild  dilatation of the ascending aorta, measuring 39 mm.   Venous: The inferior vena cava is normal in size with greater than 50%  respiratory variability, suggesting right atrial pressure of 3 mmHg.   IAS/Shunts: No atrial level shunt detected by color flow Doppler.        Assessment & Plan    1.  Peripheral arterial disease-denies lower extremity claudication.  Reports compliance with statin therapy and Plavix.  Dr.Berry reviewed lower extremity arterial duplex which showed widely patent left SFA stent.  Recommended repeat lower extremity arterial's in 12 months. Continue atorvastatin, clopidogrel Heart healthy low-sodium diet   HFrEF-EF 41-49% on last echocardiogram.  Well compensated. Continue current medication regimen Heart healthy low-sodium diet  Paroxysmal atrial fibrillation-heart rate today 97 bpm.  Reports compliance with apixaban.  Denies bleeding issues. Avoid triggers caffeine, chocolate, EtOH, dehydration etc. Continue clopidogrel, atorvastatin, metoprolol, apixaban 2.5 mg twice daily  Tobacco abuse-tobacco cessation strongly encouraged. Less than half pack per day.  Tobacco cessation instructions provided to family. Prescribed nicotine patch 7 mg x 4 weeks  Disposition: Follow-up with Dr.Berry or me in 4-6 months.   Thomasene Ripple. Kj Imbert NP-C     06/29/2023, 3:09 PM [facility] [address] Office [phone] Fax [phone]    I spent 14 minutes examining this patient, reviewing medications, and using patient centered shared decision making involving their cardiac care.   I spent  20 minutes reviewing  past medical history,  medications, and prior cardiac tests.

## 2023-06-29 ENCOUNTER — Encounter: Payer: Self-pay | Admitting: General Practice

## 2023-06-29 ENCOUNTER — Ambulatory Visit: Payer: Medicare Other | Attending: General Practice | Admitting: General Practice

## 2023-06-29 VITALS — BP 138/78 | HR 97 | Ht 62.0 in | Wt 109.0 lb

## 2023-06-29 DIAGNOSIS — I739 Peripheral vascular disease, unspecified: Secondary | ICD-10-CM | POA: Diagnosis not present

## 2023-06-29 DIAGNOSIS — I5020 Unspecified systolic (congestive) heart failure: Secondary | ICD-10-CM | POA: Diagnosis not present

## 2023-06-29 DIAGNOSIS — I480 Paroxysmal atrial fibrillation: Secondary | ICD-10-CM | POA: Diagnosis not present

## 2023-06-29 DIAGNOSIS — Z720 Tobacco use: Secondary | ICD-10-CM

## 2023-06-29 MED ORDER — METOPROLOL TARTRATE 25 MG PO TABS: 12.5000 mg | ORAL_TABLET | Freq: Two times a day (BID) | ORAL | 0 refills | Status: DC

## 2023-06-29 MED ORDER — NICOTINE 7 MG/24HR TD PT24: 7.0000 mg | MEDICATED_PATCH | Freq: Every day | TRANSDERMAL | Status: DC

## 2023-06-29 NOTE — Patient Instructions (Signed)
Medication Instructions:  TAKE METOPROLOL 25MG  IN THE AM AND 12.5MG  IN THE PM USE NICOTINE 7MG  PATCH FOR 4 WEEKS *If you need a refill on your cardiac medications before your next appointment, please call your pharmacy*  Lab Work: NONE  Other Instructions CONTINUE YOUR PHYSICAL THERAPY EXERCISES PURCHASE AND WEAR COMPRESSION STOCKINGS-DO NOT WEAR OVERNIGHT PLEASE READ AND FOLLOW ATTACHED  SALTY 6  Follow-Up: At [facility], you and your health needs are our priority.  As part of our continuing mission to provide you with exceptional heart care, we have created designated Provider Care Teams.  These Care Teams include your primary Cardiologist (physician) and Advanced Practice Providers (APPs -  Physician Assistants and Nurse Practitioners) who all work together to provide you with the care you need, when you need it.  We recommend signing up for the patient portal called "MyChart".  Sign up information is provided on this After Visit Summary.  MyChart is used to connect with patients for Virtual Visits (Telemedicine).  Patients are able to view lab/test results, encounter notes, upcoming appointments, etc.  Non-urgent messages can be sent to your provider as well.   To learn more about what you can do with MyChart, go to [email or web address].    Your next appointment:   4-6 month(s)  Provider:   Nanetta Batty, MD  or Edd Fabian, FNP

## 2023-06-29 NOTE — Addendum Note (Signed)
Addended by: Alyson Ingles on: 06/29/2023 03:26 PM   Modules accepted: Orders

## 2023-07-07 ENCOUNTER — Encounter: Payer: Self-pay | Admitting: Orthopaedic Surgery

## 2023-07-07 ENCOUNTER — Ambulatory Visit (INDEPENDENT_AMBULATORY_CARE_PROVIDER_SITE_OTHER): Payer: Medicare Other | Admitting: Orthopaedic Surgery

## 2023-07-07 DIAGNOSIS — Z720 Tobacco use: Secondary | ICD-10-CM

## 2023-07-07 DIAGNOSIS — I480 Paroxysmal atrial fibrillation: Secondary | ICD-10-CM

## 2023-07-07 DIAGNOSIS — Z96649 Presence of unspecified artificial hip joint: Secondary | ICD-10-CM

## 2023-07-07 MED ORDER — METOPROLOL TARTRATE 25 MG PO TABS: ORAL_TABLET | ORAL | 3 refills | Status: DC

## 2023-07-07 MED ORDER — METOPROLOL TARTRATE 25 MG PO TABS: 12.5000 mg | ORAL_TABLET | Freq: Every day | ORAL | 3 refills | Status: DC

## 2023-07-07 MED ORDER — METOPROLOL TARTRATE 25 MG PO TABS: 25.0000 mg | ORAL_TABLET | Freq: Every day | ORAL | 3 refills | Status: DC

## 2023-07-07 MED ORDER — NICOTINE 7 MG/24HR TD PT24: 7.0000 mg | MEDICATED_PATCH | Freq: Every day | TRANSDERMAL | 0 refills | Status: DC

## 2023-07-07 NOTE — Progress Notes (Signed)
 The patient is an 83 year old female who is now 6 weeks status post a right hip hemiarthroplasty to treat a displaced femoral neck fracture.  She says she is doing a lot better and she is still improving her activities using her walker to get around.  She does report foot and ankle swelling but no calf pain.  She reports that she is making progress.  On exam I can easily put her right hip through internal and external rotation with no issues at all.  Her incision is healed over nicely.  Her calf is soft.  There is foot and ankle swelling.  From a foot and ankle swelling standpoint I recommended elevation and considering compressive garments.  She said they are hard to get on.  She can actually stop exercising at this standpoint since she is doing so well.  I would like to see her back in 6 weeks for final visit.  Will have a standing AP pelvis and lateral of her right hip at that visit.  If she looks good at that visit, follow-up can then be as needed.

## 2023-07-07 NOTE — Telephone Encounter (Signed)
 Called and spoke to pt.  RX for Metoprolol Tartrate 25 mg (one tablet in am and 1/2 tablet in pm) sent in to their preferred pharmacy ([facility] [address], [place], [place]).  Also sent in Nicotine Patch RX that is OTC (pt made aware).

## 2023-08-04 ENCOUNTER — Telehealth: Payer: Self-pay | Admitting: Orthopaedic Surgery

## 2023-08-04 NOTE — Telephone Encounter (Unsigned)
 Gerri Spore (PT) from [facility]

## 2023-08-25 ENCOUNTER — Ambulatory Visit: Payer: Medicare Other | Admitting: Orthopaedic Surgery

## 2023-08-26 ENCOUNTER — Ambulatory Visit: Admitting: Physician Assistant

## 2023-09-20 ENCOUNTER — Other Ambulatory Visit: Payer: Self-pay

## 2023-09-20 ENCOUNTER — Telehealth: Payer: Self-pay

## 2023-09-20 DIAGNOSIS — I739 Peripheral vascular disease, unspecified: Secondary | ICD-10-CM

## 2023-09-20 NOTE — Telephone Encounter (Signed)
 Camille from [facility] called to move pt's appt up sooner with ABIs due to pt having worsening RLE wounds and R great toe "turning black". This has been ongoing for a few weeks. Pt has palpable pedal pulses and foot is warm. She has been scheduled for this week and they are aware.

## 2023-09-21 ENCOUNTER — Ambulatory Visit (HOSPITAL_COMMUNITY)
Admission: RE | Admit: 2023-09-21 | Discharge: 2023-09-21 | Disposition: A | Discharge status: Home / Self Care | Source: Ambulatory Visit | Attending: Vascular Surgery | Admitting: Vascular Surgery

## 2023-09-21 DIAGNOSIS — I739 Peripheral vascular disease, unspecified: Secondary | ICD-10-CM | POA: Diagnosis present

## 2023-09-21 LAB — VAS US ABI WITH/WO TBI
Left ABI: 0.86
Right ABI: 0.32

## 2023-09-22 ENCOUNTER — Ambulatory Visit: Attending: Vascular Surgery | Admitting: Vascular Surgery

## 2023-09-22 ENCOUNTER — Encounter: Payer: Self-pay | Admitting: Vascular Surgery

## 2023-09-22 VITALS — BP 106/65 | HR 96 | Temp 98.3°F

## 2023-09-22 DIAGNOSIS — I70261 Atherosclerosis of native arteries of extremities with gangrene, right leg: Secondary | ICD-10-CM

## 2023-09-22 DIAGNOSIS — I96 Gangrene, not elsewhere classified: Secondary | ICD-10-CM

## 2023-09-22 NOTE — Progress Notes (Signed)
 Patient ID: [name], female   DOB: 02-10-1941, 83 y.o.   MRN: [id number]  Reason for Consult: Follow-up   Referred by Lajuanda Pile, PA-C  Subjective:     HPI:  Holly Guerra is a 83 y.o. female previously evaluated by me over 2 years ago she has undergone previous angiography with intervention of the left lower extremity.  At last visit we talked about possible bypass in the right lower extremity.  She now has several weeks of wounds on the right foot which have progressively worsened.  Patient has not walked in several months due to the wounds and weakness.  She denies any fevers or chills.  She has not been admitted for this problem.  She is currently a resident at [facility].  She takes Eliquis  daily.  Past Medical History:  Diagnosis Date   Abdominal bruit    Abnormal echocardiogram    Anemia    Aortic insufficiency    Aortic root dilation (HCC)    Aortic valve disorder    Arthritis    "all over" (07/22/2012)   Arthropathy    Bilateral carotid artery stenosis    Carotid artery disease (HCC)    Chest discomfort    Edema    Exertional shortness of breath    Fatigue    GERD (gastroesophageal reflux disease)    Gout    Hypercholesteremia    Hypertension    Mitral regurgitation and aortic stenosis    Mixed hyperlipidemia    Murmur, cardiac    Peripheral vascular disease (HCC)    Pernicious anemia    PVD (peripheral vascular disease) (HCC)    Shortness of breath    Sickle-cell trait (HCC)    Thoracic aortic aneurysm (HCC)    Tobacco use    URI (upper respiratory infection)    Family History  Problem Relation Age of Onset   Hypertension Mother    Heart disease Mother    Past Surgical History:  Procedure Laterality Date   ABDOMINAL AORTOGRAM W/LOWER EXTREMITY Bilateral 05/23/2020   Procedure: ABDOMINAL AORTOGRAM W/LOWER EXTREMITY;  Surgeon: Avanell Leigh, MD;  Location: [facility];  Service: Cardiovascular;  Laterality: Bilateral;   ABDOMINAL  HYSTERECTOMY  1970's   APPENDECTOMY  1970's   CARDIOVASCULAR STRESS TEST  06/16/2012   normal myocardial perfusion imaging, LV systolic function was normal without regional wall motion abnormalities, LV EF 60%   DOPPLER ECHOCARDIOGRAPHY  06/16/2012   EF 60-65%, mild concetric LVH, moderate aortic regurg   ESOPHAGOGASTRODUODENOSCOPY (EGD) WITH PROPOFOL  N/A 05/15/2023   Procedure: ESOPHAGOGASTRODUODENOSCOPY (EGD) WITH PROPOFOL ;  Surgeon: Alvis Jourdain, MD;  Location: [facility] ENDOSCOPY;  Service: Gastroenterology;  Laterality: N/A;   HIP ARTHROPLASTY Right 05/25/2023   Procedure: ARTHROPLASTY BIPOLAR HIP (HEMIARTHROPLASTY);  Surgeon: Arnie Lao, MD;  Location: [facility] OR;  Service: Orthopedics;  Laterality: Right;   LOWER EXTREMITY ANGIOGRAM N/A 07/22/2012   Procedure: LOWER EXTREMITY ANGIOGRAM;  Surgeon: Avanell Leigh, MD;  Location: [facility] CATH LAB;  Service: Cardiovascular;  Laterality: N/A;   LOWER EXTREMITY ARTERIAL DOPPLER  08/08/2012   Left SFA stent-open and patent without evidence of hemodynamically significant stenosis   PERCUTANEOUS STENT INTERVENTION Left 07/22/2012   SAVORY DILATION N/A 05/15/2023   Procedure: SAVORY DILATION;  Surgeon: Alvis Jourdain, MD;  Location: [facility] ENDOSCOPY;  Service: Gastroenterology;  Laterality: N/A;  16mm   TOTAL THYROIDECTOMY  ~ 2010    Short Social History:  Social History   Tobacco Use   Smoking status:  Every Day    Current packs/day: 0.00    Average packs/day: 0.5 packs/day for 45.0 years (22.5 ttl pk-yrs)    Types: Cigarettes    Start date: 05/11/1962    Last attempt to quit: 05/12/2007    Years since quitting: 16.3   Smokeless tobacco: Never   Tobacco comments:    Pt states she smokes "6-7 a day"  Substance Use Topics   Alcohol use: Yes    Comment: "3 beers a week"    Allergies  Allergen Reactions   Lactose Intolerance (Gi) Diarrhea    Current Outpatient Medications  Medication Sig Dispense Refill   acetaminophen  (TYLENOL ) 500 MG tablet Take  1,000 mg by mouth 3 (three) times daily. May take 2 tablets four times a day as needed     albuterol  (VENTOLIN  HFA) 108 (90 Base) MCG/ACT inhaler Inhale 2 puffs into the lungs 3 (three) times daily for 3 days, THEN 2 puffs every 6 (six) hours as needed for wheezing or shortness of breath.     allopurinol  (ZYLOPRIM ) 100 MG tablet Take 1 tablet (100 mg total) by mouth every evening. 30 tablet 0   ascorbic acid (VITAMIN C) 500 MG tablet Take 500 mg by mouth daily.     atorvastatin  (LIPITOR ) 40 MG tablet Take 1 tablet (40 mg total) by mouth daily. 30 tablet 0   busPIRone  (BUSPAR ) 5 MG tablet Take 1 tablet (5 mg total) by mouth 3 (three) times daily. 15 tablet 0   clopidogrel  (PLAVIX ) 75 MG tablet Take 1 tablet (75 mg total) by mouth daily. 30 tablet 11   cyanocobalamin  (VITAMIN B12) 1000 MCG/ML injection Inject 1 mL (1,000 mcg total) into the muscle every 30 (thirty) days.     cycloSPORINE  (RESTASIS ) 0.05 % ophthalmic emulsion Place 1 drop into both eyes 2 (two) times daily.     docusate sodium  (COLACE) 100 MG capsule Take 1 capsule (100 mg total) by mouth 2 (two) times daily.     feeding supplement (ENSURE ENLIVE / ENSURE PLUS) LIQD Take 237 mLs by mouth 2 (two) times daily between meals.     HYDROcodone -acetaminophen  (NORCO/VICODIN) 5-325 MG tablet Take 1-2 tablets by mouth every 6 (six) hours as needed for moderate pain (pain score 4-6). 30 tablet 0   Hydrogen Peroxide 3 % MISC Take 10 mLs by mouth 3 (three) times daily as needed (Oral Rinse). Mix 10ml peroxide with 10ml water for an oral rinse. Swish and spit out     Iron-Vitamins (GERITOL) LIQD Take 15 mLs by mouth daily.     melatonin 3 MG TABS tablet Take 1 tablet (3 mg total) by mouth at bedtime as needed.     metoprolol  tartrate (LOPRESSOR ) 25 MG tablet Take 1 tablet (25 mg total) by mouth every morning AND 0.5 tablets (12.5 mg total) every evening. 135 tablet 3   mometasone -formoterol  (DULERA ) 200-5 MCG/ACT AERO Inhale 2 puffs into the lungs 2  (two) times daily.     nicotine  (NICODERM CQ  - DOSED IN MG/24 HR) 7 mg/24hr patch Place 1 patch (7 mg total) onto the skin daily. 28 patch 0   omeprazole (PRILOSEC) 40 MG capsule Take 40 mg by mouth 2 (two) times daily.     QUEtiapine  (SEROQUEL ) 25 MG tablet Take 1 tablet (25 mg total) by mouth at bedtime as needed (agitation). 15 tablet 0   tiotropium (SPIRIVA  HANDIHALER) 18 MCG inhalation capsule Place 1 capsule (18 mcg total) into inhaler and inhale daily.     triamcinolone  (KENALOG )  0.1 % paste Use as directed 1 Application in the mouth or throat 3 (three) times daily.     apixaban  (ELIQUIS ) 2.5 MG TABS tablet Take 1 tablet (2.5 mg total) by mouth 2 (two) times daily. 60 tablet 0   No current facility-administered medications for this visit.    Review of Systems  Constitutional: Positive for fatigue and unexpected weight change.  HENT: HENT negative.  Eyes: Eyes negative.  Respiratory: Respiratory negative.  GI: Gastrointestinal negative.  Skin: Positive for wound.  Neurological: Neurological negative. Hematologic: Hematologic/lymphatic negative.  Psychiatric: Psychiatric negative.        Objective:  Objective   Vitals:   09/22/23 1349  BP: 106/65  Pulse: 96  Temp: 98.3 F (36.8 C)  SpO2: 99%     Physical Exam Constitutional:      Appearance: She is ill-appearing.  HENT:     Head: Normocephalic.     Nose: Nose normal.  Eyes:     Pupils: Pupils are equal, round, and reactive to light.  Cardiovascular:     Pulses:          Femoral pulses are 1+ on the right side and 2+ on the left side. Abdominal:     General: Abdomen is flat.     Palpations: Abdomen is soft.  Musculoskeletal:     Comments: Right foot has dry gangrene in the great toe, white gangrene of the right lateral heel Left foot has healed area of ulceration on the dorsum of the left foot  Neurological:     Mental Status: She is alert.     Data: ABI Findings:   +---------+------------------+-----+-------------------+---------+  Right   Rt Pressure (mmHg)IndexWaveform           Comment    +---------+------------------+-----+-------------------+---------+  Brachial 135                                                  +---------+------------------+-----+-------------------+---------+  PTA     43                0.32 dampened monophasic           +---------+------------------+-----+-------------------+---------+  DP      0                 0.00 absent             inaudible  +---------+------------------+-----+-------------------+---------+  Great Toe0                 0.00 Absent                        +---------+------------------+-----+-------------------+---------+   +---------+------------------+-----+----------+-------+  Left    Lt Pressure (mmHg)IndexWaveform  Comment  +---------+------------------+-----+----------+-------+  Brachial 131                                       +---------+------------------+-----+----------+-------+  PTA     116               0.86 monophasic         +---------+------------------+-----+----------+-------+  DP      94                0.70 monophasic         +---------+------------------+-----+----------+-------+  Great Toe89                0.66 Abnormal           +---------+------------------+-----+----------+-------+   +-------+-----------+-----------+------------+------------+  ABI/TBIToday's ABIToday's TBIPrevious ABIPrevious TBI  +-------+-----------+-----------+------------+------------+  Right .32        0.0        .54         .33           +-------+-----------+-----------+------------+------------+  Left  .86        .66        1.02        .70           +-------+-----------+-----------+------------+------------+         Bilateral ABIs and TBIs appear decreased compared to prior study on  06/23/2023.    Summary:  Right:  Resting right ankle-brachial index indicates severe right lower  extremity arterial disease. The right toe-brachial index is abnormal.   Left: Resting left ankle-brachial index indicates mild left lower  extremity arterial disease. The left toe-brachial index is abnormal.       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  As such I recommended right above-knee amputation.  Patient is on Eliquis  and this would require discontinuation 48 hours prior to procedure.  Patient and family are going to discuss and call to schedule right above-knee amputation.     Adine Hoof MD [facility]

## 2023-09-22 NOTE — H&P (View-Only) (Signed)
 Patient ID: [facility], female   DOB: 02-10-1941, 83 y.o.   MRN: [id number]  Reason for Consult: Follow-up   Referred by Lajuanda Pile, PA-C  Subjective:     HPI:  Holly Guerra is a 83 y.o. female previously evaluated by me over 2 years ago she has undergone previous angiography with intervention of the left lower extremity.  At last visit we talked about possible bypass in the right lower extremity.  She now has several weeks of wounds on the right foot which have progressively worsened.  Patient has not walked in several months due to the wounds and weakness.  She denies any fevers or chills.  She has not been admitted for this problem.  She is currently a resident at [facility].  She takes Eliquis  daily.  Past Medical History:  Diagnosis Date   Abdominal bruit    Abnormal echocardiogram    Anemia    Aortic insufficiency    Aortic root dilation (HCC)    Aortic valve disorder    Arthritis    "all over" (07/22/2012)   Arthropathy    Bilateral carotid artery stenosis    Carotid artery disease (HCC)    Chest discomfort    Edema    Exertional shortness of breath    Fatigue    GERD (gastroesophageal reflux disease)    Gout    Hypercholesteremia    Hypertension    Mitral regurgitation and aortic stenosis    Mixed hyperlipidemia    Murmur, cardiac    Peripheral vascular disease (HCC)    Pernicious anemia    PVD (peripheral vascular disease) (HCC)    Shortness of breath    Sickle-cell trait (HCC)    Thoracic aortic aneurysm (HCC)    Tobacco use    URI (upper respiratory infection)    Family History  Problem Relation Age of Onset   Hypertension Mother    Heart disease Mother    Past Surgical History:  Procedure Laterality Date   ABDOMINAL AORTOGRAM W/LOWER EXTREMITY Bilateral 05/23/2020   Procedure: ABDOMINAL AORTOGRAM W/LOWER EXTREMITY;  Surgeon: Avanell Leigh, MD;  Location: [facility] INVASIVE CV LAB;  Service: Cardiovascular;  Laterality: Bilateral;   ABDOMINAL  HYSTERECTOMY  1970's   APPENDECTOMY  1970's   CARDIOVASCULAR STRESS TEST  06/16/2012   normal myocardial perfusion imaging, LV systolic function was normal without regional wall motion abnormalities, LV EF 60%   DOPPLER ECHOCARDIOGRAPHY  06/16/2012   EF 60-65%, mild concetric LVH, moderate aortic regurg   ESOPHAGOGASTRODUODENOSCOPY (EGD) WITH PROPOFOL  N/A 05/15/2023   Procedure: ESOPHAGOGASTRODUODENOSCOPY (EGD) WITH PROPOFOL ;  Surgeon: Alvis Jourdain, MD;  Location: [facility] ENDOSCOPY;  Service: Gastroenterology;  Laterality: N/A;   HIP ARTHROPLASTY Right 05/25/2023   Procedure: ARTHROPLASTY BIPOLAR HIP (HEMIARTHROPLASTY);  Surgeon: Arnie Lao, MD;  Location: [facility] OR;  Service: Orthopedics;  Laterality: Right;   LOWER EXTREMITY ANGIOGRAM N/A 07/22/2012   Procedure: LOWER EXTREMITY ANGIOGRAM;  Surgeon: Avanell Leigh, MD;  Location: [facility] CATH LAB;  Service: Cardiovascular;  Laterality: N/A;   LOWER EXTREMITY ARTERIAL DOPPLER  08/08/2012   Left SFA stent-open and patent without evidence of hemodynamically significant stenosis   PERCUTANEOUS STENT INTERVENTION Left 07/22/2012   SAVORY DILATION N/A 05/15/2023   Procedure: SAVORY DILATION;  Surgeon: Alvis Jourdain, MD;  Location: [facility] ENDOSCOPY;  Service: Gastroenterology;  Laterality: N/A;  16mm   TOTAL THYROIDECTOMY  ~ 2010    Short Social History:  Social History   Tobacco Use   Smoking status:  Every Day    Current packs/day: 0.00    Average packs/day: 0.5 packs/day for 45.0 years (22.5 ttl pk-yrs)    Types: Cigarettes    Start date: 05/11/1962    Last attempt to quit: 05/12/2007    Years since quitting: 16.3   Smokeless tobacco: Never   Tobacco comments:    Pt states she smokes "6-7 a day"  Substance Use Topics   Alcohol use: Yes    Comment: "3 beers a week"    Allergies  Allergen Reactions   Lactose Intolerance (Gi) Diarrhea    Current Outpatient Medications  Medication Sig Dispense Refill   acetaminophen  (TYLENOL ) 500 MG tablet Take  1,000 mg by mouth 3 (three) times daily. May take 2 tablets four times a day as needed     albuterol  (VENTOLIN  HFA) 108 (90 Base) MCG/ACT inhaler Inhale 2 puffs into the lungs 3 (three) times daily for 3 days, THEN 2 puffs every 6 (six) hours as needed for wheezing or shortness of breath.     allopurinol  (ZYLOPRIM ) 100 MG tablet Take 1 tablet (100 mg total) by mouth every evening. 30 tablet 0   ascorbic acid (VITAMIN C) 500 MG tablet Take 500 mg by mouth daily.     atorvastatin  (LIPITOR ) 40 MG tablet Take 1 tablet (40 mg total) by mouth daily. 30 tablet 0   busPIRone  (BUSPAR ) 5 MG tablet Take 1 tablet (5 mg total) by mouth 3 (three) times daily. 15 tablet 0   clopidogrel  (PLAVIX ) 75 MG tablet Take 1 tablet (75 mg total) by mouth daily. 30 tablet 11   cyanocobalamin  (VITAMIN B12) 1000 MCG/ML injection Inject 1 mL (1,000 mcg total) into the muscle every 30 (thirty) days.     cycloSPORINE  (RESTASIS ) 0.05 % ophthalmic emulsion Place 1 drop into both eyes 2 (two) times daily.     docusate sodium  (COLACE) 100 MG capsule Take 1 capsule (100 mg total) by mouth 2 (two) times daily.     feeding supplement (ENSURE ENLIVE / ENSURE PLUS) LIQD Take 237 mLs by mouth 2 (two) times daily between meals.     HYDROcodone -acetaminophen  (NORCO/VICODIN) 5-325 MG tablet Take 1-2 tablets by mouth every 6 (six) hours as needed for moderate pain (pain score 4-6). 30 tablet 0   Hydrogen Peroxide 3 % MISC Take 10 mLs by mouth 3 (three) times daily as needed (Oral Rinse). Mix 10ml peroxide with 10ml water for an oral rinse. Swish and spit out     Iron-Vitamins (GERITOL) LIQD Take 15 mLs by mouth daily.     melatonin 3 MG TABS tablet Take 1 tablet (3 mg total) by mouth at bedtime as needed.     metoprolol  tartrate (LOPRESSOR ) 25 MG tablet Take 1 tablet (25 mg total) by mouth every morning AND 0.5 tablets (12.5 mg total) every evening. 135 tablet 3   mometasone -formoterol  (DULERA ) 200-5 MCG/ACT AERO Inhale 2 puffs into the lungs 2  (two) times daily.     nicotine  (NICODERM CQ  - DOSED IN MG/24 HR) 7 mg/24hr patch Place 1 patch (7 mg total) onto the skin daily. 28 patch 0   omeprazole (PRILOSEC) 40 MG capsule Take 40 mg by mouth 2 (two) times daily.     QUEtiapine  (SEROQUEL ) 25 MG tablet Take 1 tablet (25 mg total) by mouth at bedtime as needed (agitation). 15 tablet 0   tiotropium (SPIRIVA  HANDIHALER) 18 MCG inhalation capsule Place 1 capsule (18 mcg total) into inhaler and inhale daily.     triamcinolone  (KENALOG )  0.1 % paste Use as directed 1 Application in the mouth or throat 3 (three) times daily.     apixaban  (ELIQUIS ) 2.5 MG TABS tablet Take 1 tablet (2.5 mg total) by mouth 2 (two) times daily. 60 tablet 0   No current facility-administered medications for this visit.    Review of Systems  Constitutional: Positive for fatigue and unexpected weight change.  HENT: HENT negative.  Eyes: Eyes negative.  Respiratory: Respiratory negative.  GI: Gastrointestinal negative.  Skin: Positive for wound.  Neurological: Neurological negative. Hematologic: Hematologic/lymphatic negative.  Psychiatric: Psychiatric negative.        Objective:  Objective   Vitals:   09/22/23 1349  BP: 106/65  Pulse: 96  Temp: 98.3 F (36.8 C)  SpO2: 99%     Physical Exam Constitutional:      Appearance: She is ill-appearing.  HENT:     Head: Normocephalic.     Nose: Nose normal.  Eyes:     Pupils: Pupils are equal, round, and reactive to light.  Cardiovascular:     Pulses:          Femoral pulses are 1+ on the right side and 2+ on the left side. Abdominal:     General: Abdomen is flat.     Palpations: Abdomen is soft.  Musculoskeletal:     Comments: Right foot has dry gangrene in the great toe, white gangrene of the right lateral heel Left foot has healed area of ulceration on the dorsum of the left foot  Neurological:     Mental Status: She is alert.     Data: ABI Findings:   +---------+------------------+-----+-------------------+---------+  Right   Rt Pressure (mmHg)IndexWaveform           Comment    +---------+------------------+-----+-------------------+---------+  Brachial 135                                                  +---------+------------------+-----+-------------------+---------+  PTA     43                0.32 dampened monophasic           +---------+------------------+-----+-------------------+---------+  DP      0                 0.00 absent             inaudible  +---------+------------------+-----+-------------------+---------+  Great Toe0                 0.00 Absent                        +---------+------------------+-----+-------------------+---------+   +---------+------------------+-----+----------+-------+  Left    Lt Pressure (mmHg)IndexWaveform  Comment  +---------+------------------+-----+----------+-------+  Brachial 131                                       +---------+------------------+-----+----------+-------+  PTA     116               0.86 monophasic         +---------+------------------+-----+----------+-------+  DP      94                0.70 monophasic         +---------+------------------+-----+----------+-------+  Great Toe89                0.66 Abnormal           +---------+------------------+-----+----------+-------+   +-------+-----------+-----------+------------+------------+  ABI/TBIToday's ABIToday's TBIPrevious ABIPrevious TBI  +-------+-----------+-----------+------------+------------+  Right .32        0.0        .54         .33           +-------+-----------+-----------+------------+------------+  Left  .86        .66        1.02        .70           +-------+-----------+-----------+------------+------------+         Bilateral ABIs and TBIs appear decreased compared to prior study on  06/23/2023.    Summary:  Right:  Resting right ankle-brachial index indicates severe right lower  extremity arterial disease. The right toe-brachial index is abnormal.   Left: Resting left ankle-brachial index indicates mild left lower  extremity arterial disease. The left toe-brachial index is abnormal.       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  As such I recommended right above-knee amputation.  Patient is on Eliquis  and this would require discontinuation 48 hours prior to procedure.  Patient and family are going to discuss and call to schedule right above-knee amputation.     Adine Hoof MD [facility]

## 2023-09-23 ENCOUNTER — Ambulatory Visit

## 2023-09-29 ENCOUNTER — Telehealth: Payer: Self-pay

## 2023-09-29 NOTE — Telephone Encounter (Signed)
 Rec'd call from [place] at [facility] in regards to scheduling patient for R AKA.  LVM for Aron Lard informing that the family of Ms. Mazzeo was going to make a decision and call.

## 2023-09-30 ENCOUNTER — Other Ambulatory Visit: Payer: Self-pay

## 2023-09-30 ENCOUNTER — Encounter: Payer: Self-pay | Admitting: Vascular Surgery

## 2023-09-30 ENCOUNTER — Telehealth: Payer: Self-pay

## 2023-09-30 DIAGNOSIS — I70261 Atherosclerosis of native arteries of extremities with gangrene, right leg: Secondary | ICD-10-CM

## 2023-09-30 NOTE — Telephone Encounter (Signed)
 Patient's daughter, Burdette Carolin, returned this office's call.   Attempted to reach daughter again - LVM.

## 2023-09-30 NOTE — Telephone Encounter (Signed)
 Attempted to call for surgery scheduling. LVM

## 2023-10-07 ENCOUNTER — Encounter: Payer: Self-pay | Admitting: Physician Assistant

## 2023-10-07 NOTE — Progress Notes (Unsigned)
 OFFICE NOTE Date:  10/08/2023  ID:  Holly Guerra, DOB 1940/09/21, MRN [id number] PCP: Aviva Lemmings  [facility] Providers Cardiologist:  Lauro Portal, MD     Patient Profile:     HFmrEF (heart failure with mildly reduced ejection fraction)  Aortic insufficiency, aortic stenosis TTE 05/04/2023: EF 45-50, papillary muscle hypertrophy, severe concentric LVH, GRII DD, normal RVSF, severe LAE, mild MR, mild-moderate AI, moderate aortic stenosis (mean 18 mmHg, V-max 302 cm/s, DI 0.36), mildly dilated ascending aorta 39 mm, mid to distal anterior, mid to distal lateral, mid and distal anterior, mid and distal inferior, mid anterolateral, mid inferoseptal HK TTE 08/09/2023 [facility]): Mild concentric LVH, EF 50-55, severe LAE, moderate AI, mild aortic stenosis (mean 14 mmHg V-max 249 cm/s, DI 0.37), moderate MR, moderate TR, moderate to severe pulmonary hypertension, RVSP 71, mild to moderate PI, mildly dilated ascending aorta 40 mm Paroxysmal atrial fibrillation Peripheral arterial disease S/p L SFA stent in 07/2012 Status post failed intervention to right SFA 07/2019 LE a-gram 05/2020 >> occluded R SFA >> med Rx  Critical limb ischemia >> right AKA plan in 10/2023 ABIs 09/21/2023: R 0.32, L 0.86 Carotid artery disease Ascending aorta dilation TTE 04/2023: 39 mm TTE 3/25: 40 mm CT 07/2023: 43 mm Hypertension Hyperlipidemia Sickle cell trait Right hip fracture (s/p fall) 05/2023 s/p hemiarthroplasty       Discussed the use of AI scribe software for clinical note transcription with the patient, who gave verbal consent to proceed. History of Present Illness Holly Guerra is a 83 y.o. female Holly Guerra is a 83 y.o. female who returns for post hospitalization follow up. She was last seen in clinic by Lawana Pray, NP in 06/2023.   She was admitted to [facility] 3/28-4/4 sepsis in the setting of COVID 19 infection, UTI and possible pneumonia complicated by volume  overload, acute on chronic kidney disease, elevated LFTs and acute on chronic anemia.  The patient required right sided thoracentesis.She was followed by nephrology.  Creatinine improved prior to discharge.  She was noted to have an 8-second pause and was seen by cardiology/EP.  She was significantly hypokalemic at the time.  It was suspected she may have tachybradycardia syndrome worsened by electrolyte derangement, AKI and current infectious process.  It was recommended to avoid AV nodal blocking agents and to follow-up as an outpatient.  Echo demonstrated low normal LV function with EF 50-55, severe pulmonary hypertension, moderate MR, mild AS, moderate AI.  Since discharge, she has seen Dr. Vikki Graves with vascular surgery.  The patient is noted to have a nonviable right foot with wet gangrene and dry gangrene.  Plan is to proceed with right above-the-knee amputation.  She is here alone.  She is currently staying at [facility] skilled nursing facility. She tells me that she uses a walker and can ambulate down the hallway.  She has not had significant shortness of breath, but experiences occasional dyspnea, particularly when ascending stairs. She sleeps with three pillows and seems to describe nocturnal dyspnea.  She has not had chest discomfort, pain, pressure, or tightness. She does not recall recent syncope, although her family mentioned an episode several months ago.  We had difficulty obtaining her O2 sats in the office today. We contacted her SNF.  Her oxygen saturation was 94% last night.  ROS-See HPI    Studies Reviewed:      Results LABS Potassium: 3.6 (08/13/2023) Creatinine: 1.4 (08/13/2023) Albumin: 2.4 (08/13/2023) Total  Protein: 5.6 (08/13/2023) ALT: 138 (08/13/2023) Magnesium : 1.7 (08/13/2023) Hemoglobin: 8 (08/13/2023)  Risk Assessment/Calculations:  CHA2DS2-VASc Score = 6   This indicates a 9.7% annual risk of stroke. The patient's score is based upon: CHF History: 1 HTN  History: 1 Diabetes History: 0 Stroke History: 0 Vascular Disease History: 1 Age Score: 2 Gender Score: 1           Physical Exam:  VS:  BP 108/72   Pulse 84   Ht 5\' 2"  (1.575 m)   BMI 19.94 kg/m    Wt Readings from Last 3 Encounters:  06/29/23 109 lb (49.4 kg)  05/22/23 89 lb (40.4 kg)  05/19/23 89 lb 9.6 oz (40.6 kg)    Constitutional:      Appearance: Not in distress. Frail. Chronically ill-appearing.  Pulmonary:     Breath sounds: Normal breath sounds. No wheezing. No rales.     Comments: No egophony  Cardiovascular:     Normal rate. Regular rhythm.     Murmurs: There is no murmur.  Edema:    Peripheral edema absent.  Abdominal:     Palpations: Abdomen is soft.  Musculoskeletal:     Cervical back: Neck supple. Skin:    General: Skin is warm and dry.      Assessment and Plan: Assessment & Plan Chronic heart failure with mildly reduced ejection fraction (HFmrEF, 41-49%) (HCC) Chronic heart failure with mildly reduced ejection fraction. Most recent echocardiogram in March 2025 showed EF low normal at 50-55%.  Currently, her volume status is stable.  - Continue torsemide 10 mg daily. - She has labs pending next week prior to her amputation.  - Order follow-up echocardiogram in the next couple of months to recheck pulmonary pressures and ejection fraction. PAF (paroxysmal atrial fibrillation) (HCC) Rate is regular and stable on examination. Currently on dose-appropriate anticoagulation with Eliquis  2.5 mg twice daily.  The hospital notes from [facility] in late March indicate AV nodal agents were discontinued.  However, she has been placed back on metoprolol  to tartrate at some point. - Continue Eliquis  2.5 mg twice daily. - Continue metoprolol  tartrate 25 mg in the morning and 12.5 mg in the evening. Pulmonary hypertension, unspecified (HCC) Elevated PASP on echocardiogram in March 2025 was likely related to acute heart failure.  I will make sure she has a follow-up  echocardiogram to reassess pulmonary pressures.  This can be done after she recovers from her amputation. - Arrange follow-up echocardiogram Tachycardia-bradycardia syndrome (HCC) Eight-second pause described in the notes from her discharge summary from hospitalization in March.  She has not had symptoms of syncope or near syncope.  As noted, she was placed back on beta blocker therapy at some point without incident. - Arrange a follow-up 30-day event monitor.  This can be placed after she recovers from her amputation. PAD (peripheral artery disease) (HCC) Non-viable right foot with wet and dry gangrene.  She has planned right above-knee amputation next week with vascular surgery. Nonrheumatic aortic valve insufficiency Moderate mitral regurgitation, moderate aortic insufficiency, and mild aortic stenosis noted on echocardiogram.  As noted, she will have another echocardiogram in a few months to reassess her pulmonary pressures.      Dispo:  Return in about 3 months (around 01/08/2024) for Routine Follow Up w/ Dr. Katheryne Pane, or Marlyse Single, PA-C. Signed, Marlyse Single, PA-C

## 2023-10-08 ENCOUNTER — Encounter: Payer: Self-pay | Admitting: Physician Assistant

## 2023-10-08 ENCOUNTER — Ambulatory Visit: Attending: Physician Assistant | Admitting: Physician Assistant

## 2023-10-08 VITALS — BP 108/72 | HR 84 | Ht 62.0 in

## 2023-10-08 DIAGNOSIS — I495 Sick sinus syndrome: Secondary | ICD-10-CM

## 2023-10-08 DIAGNOSIS — I5022 Chronic systolic (congestive) heart failure: Secondary | ICD-10-CM

## 2023-10-08 DIAGNOSIS — I272 Pulmonary hypertension, unspecified: Secondary | ICD-10-CM | POA: Diagnosis not present

## 2023-10-08 DIAGNOSIS — I350 Nonrheumatic aortic (valve) stenosis: Secondary | ICD-10-CM

## 2023-10-08 DIAGNOSIS — I480 Paroxysmal atrial fibrillation: Secondary | ICD-10-CM

## 2023-10-08 DIAGNOSIS — I351 Nonrheumatic aortic (valve) insufficiency: Secondary | ICD-10-CM

## 2023-10-08 DIAGNOSIS — I739 Peripheral vascular disease, unspecified: Secondary | ICD-10-CM

## 2023-10-08 NOTE — Assessment & Plan Note (Signed)
 Rate is regular and stable on examination. Currently on dose-appropriate anticoagulation with Eliquis  2.5 mg twice daily.  The hospital notes from [facility] in late March indicate AV nodal agents were discontinued.  However, she has been placed back on metoprolol  to tartrate at some point. - Continue Eliquis  2.5 mg twice daily. - Continue metoprolol  tartrate 25 mg in the morning and 12.5 mg in the evening.

## 2023-10-08 NOTE — Assessment & Plan Note (Signed)
 Chronic heart failure with mildly reduced ejection fraction. Most recent echocardiogram in March 2025 showed EF low normal at 50-55%.  Currently, her volume status is stable.  - Continue torsemide 10 mg daily. - She has labs pending next week prior to her amputation.  - Order follow-up echocardiogram in the next couple of months to recheck pulmonary pressures and ejection fraction.

## 2023-10-08 NOTE — Patient Instructions (Signed)
 Medication Instructions:  No changes. See medication list. *If you need a refill on your cardiac medications before your next appointment, please call your pharmacy*   Testing/Procedures: Event Monitor  Echocardiogram   Follow-Up: At [facility], you and your health needs are our priority.  As part of our continuing mission to provide you with exceptional heart care, our providers are all part of one team.  This team includes your primary Cardiologist (physician) and Advanced Practice Providers or APPs (Physician Assistants and Nurse Practitioners) who all work together to provide you with the care you need, when you need it.  Your next appointment:   3 month(s)  Provider:   Lauro Portal, MD or Lawana Pray, NP or Marlyse Single, PA-C          We recommend signing up for the patient portal called "MyChart".  Sign up information is provided on this After Visit Summary.  MyChart is used to connect with patients for Virtual Visits (Telemedicine).  Patients are able to view lab/test results, encounter notes, upcoming appointments, etc.  Non-urgent messages can be sent to your provider as well.   To learn more about what you can do with MyChart, go to [email or web address].   Other Instructions

## 2023-10-13 ENCOUNTER — Encounter (HOSPITAL_COMMUNITY): Payer: Self-pay | Admitting: Vascular Surgery

## 2023-10-13 ENCOUNTER — Other Ambulatory Visit: Payer: Self-pay

## 2023-10-13 NOTE — Progress Notes (Signed)
 PCP - denies Cardiologist - Dr. Lauro Portal  PPM/ICD - denies   Chest x-ray - 08/09/23 EKG - 08/09/23- tracing requested Stress Test - 01/07/15 ECHO - 05/04/23 Cardiac Cath - 08/01/19  CPAP - denies  DM- denies  Blood Thinner Instructions: Hold Eliquis  2 days. Last dose 6/3. Start holding Plavix  ASAP per Mylinda Asa, Charity fundraiser. Last dose 6/4 Aspirin  Instructions: n/a  ERAS Protcol - no, NPO  COVID TEST- n/a  Anesthesia review: yes, cardiac hx  Patient's daughter verbally denies pt has any shortness of breath, fever, cough and chest pain during phone call      Questions were answered for pt's daughter.  Kim verbalized understanding of instructions.

## 2023-10-13 NOTE — Progress Notes (Signed)
 Surgical instructions faxed to [facility] # [phone]

## 2023-10-13 NOTE — Pre-Procedure Instructions (Signed)
-------------    SDW INSTRUCTIONS given: (for [facility]) ATTN: Maurie Southern  Your procedure is scheduled on 6/6.  Report to [facility] Main Entrance "A" at 05:30 A.M., and check in at the Admitting office.  Any questions or running late day of surgery: call [phone]    Remember:  Do not eat or drink after midnight the night before your surgery     Take these medicines the morning of surgery with A SIP OF WATER  amlodipine , flonase, metoprolol            May take these medicines IF NEEDED: Tylenol    STOP taking Eliquis  2 days prior. Last dose 6/3. STOP taking Plavix  ASAP. Last dose 6/4.  PLEASE BRING A COPY OF PATIENT'S MEDICATION LIST, INCLUDING LAST DOSE OF EACH MEDICATION   As of today, STOP taking any Aspirin  (unless otherwise instructed by your surgeon) Aleve, Naproxen, Ibuprofen , Motrin , Advil , Goody's, BC's, all herbal medications, fish oil, and all vitamins.   Do NOT Smoke (Tobacco/Vaping) 24 hours prior to your procedure  If you use a CPAP at night, you may bring all equipment for your overnight stay.     You will be asked to remove any contacts, glasses, piercing's, hearing aid's, dentures/partials prior to surgery. Please bring cases for these items if needed.     Patients discharged the day of surgery will not be allowed to drive home, and someone needs to stay with them for 24 hours.  SURGICAL WAITING ROOM VISITATION Patients may have no more than 2 support people in the waiting area - these visitors may rotate.   Pre-op nurse will coordinate an appropriate time for 1 ADULT support person, who may not rotate, to accompany patient in pre-op.  Children under the age of 73 must have an adult with them who is not the patient and must remain in the main waiting area with an adult.  If the patient needs to stay at the hospital during part of their recovery, the visitor guidelines for inpatient rooms apply.  Please refer to the [facility] website for the visitor guidelines  for any additional information.   Special instructions:   [place]- Preparing For Surgery   Please follow these instructions carefully.   Shower the NIGHT BEFORE SURGERY and the MORNING OF SURGERY with DIAL Soap.   Pat yourself dry with a CLEAN TOWEL.  Wear CLEAN PAJAMAS to bed the night before surgery  Place CLEAN SHEETS on your bed the night of your first shower and DO NOT SLEEP WITH PETS.   Additional instructions for the day of surgery: DO NOT APPLY any lotions, deodorants, cologne, or perfumes.   Do not wear jewelry or makeup Do not wear nail polish, gel polish, artificial nails, or any other type of covering on natural nails (fingers and toes) Do not bring valuables to the hospital. [facility] is not responsible for valuables/personal belongings. Put on clean/comfortable clothes.  Please brush your teeth.  Ask your nurse before applying any prescription medications to the skin.

## 2023-10-14 NOTE — Progress Notes (Signed)
 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    08/06/23-08/13/23 admission [facility] for sepsis with hypothermia and septic shock 2/2 UTI and PNA (CAP, bacterial vs COVID-19). Required right thoracentesis. Completed IV antibiotics. Had 8 second pause on telemetry on 08/09/23 in setting of hypokalemia. Echo showed EF 50 to 55%, left atrium severely dilated, right atrium borderline dilated, mild AR, mild AS, moderate MR, moderate TR, moderate to severe PAH no pericardial effusion.  "EP saw patient and stated no implanted devices until patient overcomes acute illness. Likely tachy-brady syndrome worsened by electrolyte derangements, AKI, and current infectious process. Avoid AV nodal blocking agents, continue tele monitoring and EP/cardiology will continue to follow (no indication for temporary pacing at this time)."  Last cardiology follow-up was on 10/08/23 with Marlyse Single, PA-C. He noted plans to proceed with right AKA for right foot gangrene. She was residing at [facility]. Using a walker to ambulate in the hallway. Occasional dyspnea, particularly with  incline/stairs. Also with some nocturnal dyspnea and sleeps on 3 pillows. No chest pains. Volume status felt stable. He noted rhythm stable and had been placed back on metorprolol since March admission. Plant for 30 day monitor after surgery to further evaluation for any recurrent pauses or arrhthymias. Elevated PASP on TTE then was likely related to acute HF with acute illness. Follow-up echo is planned after she recovers from AKA. 3 month follow-up planned.   Per VVS, last dose Eliquis  10/12/23 and last dose Plavix  10/13/23.   Anesthesia team to evaluate on the day of surgery.    VS:  BP Readings from Last 3 Encounters:  10/08/23 108/72  09/22/23 106/65  06/29/23 138/78   Pulse Readings from Last 3 Encounters:  10/08/23 84  09/22/23 96  06/29/23 97     PROVIDERS: Lauro Portal, MD is cardiologist   LABS: For day of surgery as indicated. Last results in [facility] include: Lab Results  Component Value Date   WBC 7.5 05/27/2023   HGB 9.0 (L) 05/27/2023   HCT 27.4 (L) 05/27/2023   PLT 420 (H) 05/27/2023   GLUCOSE 109 (H) 05/27/2023   ALT 30 05/13/2023   AST 28 05/13/2023   NA 132 (L) 05/27/2023   K 4.4 05/27/2023   CL 101 05/27/2023   CREATININE 1.07 (H) 05/27/2023   BUN 17 05/27/2023   CO2 24 05/27/2023   TSH 2.555 10/22/2021   INR 1.2 05/23/2023     IMAGES: MRI Abd/MRCP 08/09/23 ([facility]): 1. Despite efforts by the technologist and patient, motion artifact  is present on today's exam and could not be eliminated. This reduces  exam sensitivity and specificity.  2. Prominent cardiomegaly.  3. Bilateral pleural effusions with associated basilar atelectasis.  Extensive subcutaneous edema in the chest and upper abdomen.  Mesenteric edema with trace ascites. Appearance compatible with  third spacing of fluids.  4. No biliary dilatation identified. The MRCP images are  nondiagnostic (due to motion) despite repeat attempts.  5. Mild nonspecific gallbladder wall thickening.   6. Sigmoid colon diverticulosis.  7. Abdominal aortic atherosclerosis.   CT Chest 08/08/23 ([facility]): 1. Anasarca with generalized soft tissue edema, moderate right and  small left pleural effusions.  2. Dependent opacities in both lower lobes which may reflect  atelectasis or pneumonia. Correlate clinically. Patchy ground-glass  opacities in the right upper lobe.  3. Cardiomegaly with trace pericardial fluid.  4. Stable dilatation of the ascending aorta to 4.3 cm. Chronic  enlargement of the pulmonary arteries consistent with pulmonary  arterial hypertension.  5. Aortic Atherosclerosis (ICD10-I70.0) and Emphysema (ICD10-J43.9).   CT Head 08/06/23 ([facility]): IMPRESSION: 1. No acute intracranial findings.  2. Chronic microvascular ischemic change and cerebral volume loss.    EKG:  EKG 08/09/23 ([facility]): Per Narrative in [facility]: Sinus bradycardia at 56 bpm T wave abnormality, consider anterior ischemia  When compared with ECG of 09-Aug-2023 08 23,  Premature supraventricular complexes are no longer present  Nonspecific T wave abnormality has replaced inverted T waves in lateral leads  QT has shortened  Confirmed by Marylouise Socks  [phone]  on 08-09-2023 1 55 14 PM   EKG 05/23/23: Sinus rhythm Atrial premature complex LVH with secondary repolarization abnormality ST depr, consider ischemia, inferior leads Borderline prolonged QT interval When compared with ECG of 05/10/2023, No significant change was found Confirmed by Alissa April ([id number]) on 05/23/2023 1:48:16 AM   CV: Echo 08/09/23 ([facility]): There is mild concentric left ventricular hypertrophy.  The left ventricular size is normal.  Left ventricular systolic function is normal.  LV ejection fraction = 50-55%.  The left atrium is severely dilated.  The right atrium is borderline dilated.  There is moderate aortic regurgitation.  There is mild aortic stenosis.  Aortic valve mean pressure gradient is 14 mmHg. The  calculated aortic valve area using the continuity equation is 1.0 cm2. AS dimensionless index  VTI  is 0.37.  There is moderate mitral regurgitation.  There is moderate tricuspid regurgitation.  Moderate to severe pulmonary hypertension. Estimated right  ventricular systolic pressure is 71 mmHg.  There is no pericardial effusion.  IVC size was mildly dilated.  Mildly dilated ascending aorta, 4.0 cm.  - Comparison TTE 05/04/23: Papillary muscle hypertrophy. LVEF 45-50%, severe concentric LVH of the inferior segment, grade 2 DD, normal RV systolic function,  TR signal is inadequate for assessing  PA pressure, severely dilated LA, mild MR, mild-moderate AR, moderate AS, AV mean gradient 18.0 mmHg, AV peak gradient  36.5 mmHg, AVA by VTI measures 1.02 cm, 39 mm ascending aorta.   Past Medical History:  Diagnosis Date   Abdominal bruit    Abnormal echocardiogram    Anemia    Aortic insufficiency    Aortic root dilation (HCC)    Aortic valve disorder    Arthritis    "all over" (07/22/2012)   Arthropathy    Atrial fibrillation (HCC)    Bilateral carotid artery stenosis    Carotid artery disease (HCC)    Chest discomfort    Dementia (HCC)    Edema    Exertional shortness of breath    Fatigue    GERD (gastroesophageal reflux disease)    Gout    Hypercholesteremia    Hypertension  Mitral regurgitation and aortic stenosis    Mixed hyperlipidemia    Murmur, cardiac    Peripheral vascular disease (HCC)    Pernicious anemia    PVD (peripheral vascular disease) (HCC)    Shortness of breath    Sickle-cell trait (HCC)    Thoracic aortic aneurysm (HCC)    Tobacco use    URI (upper respiratory infection)     Past Surgical History:  Procedure Laterality Date   ABDOMINAL AORTOGRAM W/LOWER EXTREMITY Bilateral 05/23/2020   Procedure: ABDOMINAL AORTOGRAM W/LOWER EXTREMITY;  Surgeon: Avanell Leigh, MD;  [facility] INVASIVE CV LAB;  Service: Cardiovascular;  Laterality: Bilateral;    ABDOMINAL HYSTERECTOMY  01/09/1969   APPENDECTOMY  01/09/1969   CARDIOVASCULAR STRESS TEST  06/16/2012   normal myocardial perfusion imaging, LV systolic function was normal without regional wall motion abnormalities, LV EF 60%   DOPPLER ECHOCARDIOGRAPHY  06/16/2012   EF 60-65%, mild concetric LVH, moderate aortic regurg   ESOPHAGOGASTRODUODENOSCOPY (EGD) WITH PROPOFOL  N/A 05/15/2023   Procedure: ESOPHAGOGASTRODUODENOSCOPY (EGD) WITH PROPOFOL ;  Surgeon: Alvis Jourdain, MD;  Location: [facility] ENDOSCOPY;  Service: Gastroenterology;  Laterality: N/A;   HIP ARTHROPLASTY Right 05/25/2023   Procedure: ARTHROPLASTY BIPOLAR HIP (HEMIARTHROPLASTY);  Surgeon: Arnie Lao, MD;  Location: [facility] OR;  Service: Orthopedics;  Laterality: Right;   LOWER EXTREMITY ANGIOGRAM N/A 07/22/2012   Procedure: LOWER EXTREMITY ANGIOGRAM;  Surgeon: Avanell Leigh, MD;  Location: [facility] CATH LAB;  Service: Cardiovascular;  Laterality: N/A;   LOWER EXTREMITY ARTERIAL DOPPLER  08/08/2012   Left SFA stent-open and patent without evidence of hemodynamically significant stenosis   PERCUTANEOUS STENT INTERVENTION Left 07/22/2012   SAVORY DILATION N/A 05/15/2023   Procedure: SAVORY DILATION;  Surgeon: Alvis Jourdain, MD;  Location: [facility] ENDOSCOPY;  Service: Gastroenterology;  Laterality: N/A;  16mm   TOTAL THYROIDECTOMY  ~ 2010    MEDICATIONS: No current facility-administered medications for this encounter.    acetaminophen  (TYLENOL ) 500 MG tablet   allopurinol  (ZYLOPRIM ) 100 MG tablet   Amino Acids-Protein Hydrolys (FEEDING SUPPLEMENT, PRO-STAT 64,) LIQD   amLODipine  (NORVASC ) 5 MG tablet   apixaban  (ELIQUIS ) 2.5 MG TABS tablet   apixaban  (ELIQUIS ) 2.5 MG TABS tablet   calcium  carbonate (TUMS - DOSED IN MG ELEMENTAL CALCIUM ) 500 MG chewable tablet   Calcium  Carbonate Antacid (CALCIUM  CARBONATE PO)   clopidogrel  (PLAVIX ) 75 MG tablet   cyanocobalamin  (VITAMIN B12) 1000 MCG/ML injection   cyclobenzaprine  (FLEXERIL ) 10  MG tablet   ferrous sulfate  325 (65 FE) MG tablet   fluticasone (FLONASE) 50 MCG/ACT nasal spray   hydrOXYzine (ATARAX) 25 MG tablet   ibuprofen  (ADVIL ) 200 MG tablet   ibuprofen  (ADVIL ) 200 MG tablet   metoprolol  tartrate (LOPRESSOR ) 25 MG tablet   Multiple Vitamins-Minerals (MULTIVITAMIN WITH MINERALS) tablet   Multiple Vitamins-Minerals (MULTIVITAMIN WITH MINERALS) tablet   nicotine  (NICODERM CQ  - DOSED IN MG/24 HOURS) 21 mg/24hr patch   omeprazole (PRILOSEC) 40 MG capsule   thiamine  (VITAMIN B1) 100 MG tablet   torsemide (DEMADEX) 10 MG tablet   Vitamin D, Ergocalciferol, (DRISDOL) 1.25 MG (50000 UNIT) CAPS capsule   nicotine  (NICODERM CQ  - DOSED IN MG/24 HR) 7 mg/24hr patch    Ella Gun, PA-C Surgical Short Stay/Anesthesiology [facility] Phone [phone] [facility] Phone [phone] 10/14/2023 5:11 PM

## 2023-10-14 NOTE — Anesthesia Preprocedure Evaluation (Signed)
 Anesthesia Evaluation  Patient identified by MRN, date of birth, ID band Patient awake    Reviewed: Allergy & Precautions, NPO status , Patient's Chart, lab work & pertinent test results, reviewed documented beta blocker date and time   Airway Mallampati: II  TM Distance: >3 FB Neck ROM: Full    Dental  (+) Dental Advisory Given, Edentulous Upper, Edentulous Lower   Pulmonary Patient abstained from smoking., former smoker   Pulmonary exam normal breath sounds clear to auscultation       Cardiovascular hypertension, Pt. on home beta blockers + Peripheral Vascular Disease  Normal cardiovascular exam+ Valvular Problems/Murmurs MR and AS  Rhythm:Regular Rate:Normal  Echo 08/09/23 ([facility]): There is mild concentric left ventricular hypertrophy.  The left ventricular size is normal.  Left ventricular systolic function is normal.  LV ejection fraction = 50-55%.  The left atrium isseverely dilated.  The right atrium is borderline dilated.  There is moderate aortic regurgitation.  There is mild aortic stenosis.  Aortic valve mean pressure gradient is 14 mmHg. The calculated aortic valve area using the continuity equation is 1.0 cm2. AS dimensionless index VTI is 0.37.  There is moderate mitral regurgitation.  There is moderate tricuspid regurgitation.  Moderate to severe pulmonary hypertension. Estimated right  ventricular systolic pressure is 71 mmHg.  There is no pericardial effusion.  IVC size was mildly dilated.  Mildly dilated ascending aorta, 4.0 cm.     Neuro/Psych       Dementia negative neurological ROS     GI/Hepatic Neg liver ROS,GERD  Medicated,,  Endo/Other  negative endocrine ROS    Renal/GU negative Renal ROS     Musculoskeletal negative musculoskeletal ROS (+) Arthritis ,  RIGHT HIP FRACTURE   Abdominal   Peds  Hematology  (+) Blood dyscrasia (Eliquis , Plavix ), Sickle cell trait and anemia    Anesthesia Other Findings Day of surgery medications reviewed with the patient.  Reproductive/Obstetrics                             Anesthesia Physical Anesthesia Plan  ASA: 4  Anesthesia Plan: General   Post-op Pain Management:    Induction: Intravenous  PONV Risk Score and Plan: 3 and Dexamethasone , Ondansetron , Midazolam  and Treatment may vary due to age or medical condition  Airway Management Planned: LMA  Additional Equipment:   Intra-op Plan:   Post-operative Plan: Extubation in OR  Informed Consent: I have reviewed the patients History and Physical, chart, labs and discussed the procedure including the risks, benefits and alternatives for the proposed anesthesia with the patient or authorized representative who has indicated his/her understanding and acceptance.     Dental advisory given  Plan Discussed with: CRNA  Anesthesia Plan Comments: (PAT note written 10/14/2023 by Keiarah Orlowski, PA-C.  )        Anesthesia Quick Evaluation

## 2023-10-15 ENCOUNTER — Inpatient Hospital Stay (HOSPITAL_COMMUNITY)
Admission: RE | Admit: 2023-10-15 | Discharge: 2023-10-20 | DRG: 240 | Disposition: A | Discharge status: Skilled Nursing Facility | Attending: Student | Admitting: Student

## 2023-10-15 ENCOUNTER — Encounter (HOSPITAL_COMMUNITY)
Admission: RE | Disposition: A | Discharge status: Skilled Nursing Facility | Payer: Self-pay | Source: Home / Self Care | Attending: Vascular Surgery

## 2023-10-15 ENCOUNTER — Inpatient Hospital Stay (HOSPITAL_COMMUNITY)

## 2023-10-15 ENCOUNTER — Encounter (HOSPITAL_COMMUNITY): Payer: Self-pay | Admitting: Vascular Surgery

## 2023-10-15 ENCOUNTER — Other Ambulatory Visit: Payer: Self-pay

## 2023-10-15 DIAGNOSIS — Z96641 Presence of right artificial hip joint: Secondary | ICD-10-CM | POA: Diagnosis present

## 2023-10-15 DIAGNOSIS — I70261 Atherosclerosis of native arteries of extremities with gangrene, right leg: Secondary | ICD-10-CM | POA: Diagnosis present

## 2023-10-15 DIAGNOSIS — I10 Essential (primary) hypertension: Secondary | ICD-10-CM | POA: Diagnosis present

## 2023-10-15 DIAGNOSIS — E782 Mixed hyperlipidemia: Secondary | ICD-10-CM | POA: Diagnosis present

## 2023-10-15 DIAGNOSIS — F0390 Unspecified dementia without behavioral disturbance: Secondary | ICD-10-CM | POA: Diagnosis present

## 2023-10-15 DIAGNOSIS — I5020 Unspecified systolic (congestive) heart failure: Secondary | ICD-10-CM

## 2023-10-15 DIAGNOSIS — Z7951 Long term (current) use of inhaled steroids: Secondary | ICD-10-CM | POA: Diagnosis not present

## 2023-10-15 DIAGNOSIS — I110 Hypertensive heart disease with heart failure: Secondary | ICD-10-CM

## 2023-10-15 DIAGNOSIS — Z87440 Personal history of urinary (tract) infections: Secondary | ICD-10-CM | POA: Diagnosis not present

## 2023-10-15 DIAGNOSIS — E890 Postprocedural hypothyroidism: Secondary | ICD-10-CM | POA: Diagnosis present

## 2023-10-15 DIAGNOSIS — D573 Sickle-cell trait: Secondary | ICD-10-CM | POA: Diagnosis present

## 2023-10-15 DIAGNOSIS — I480 Paroxysmal atrial fibrillation: Secondary | ICD-10-CM | POA: Diagnosis present

## 2023-10-15 DIAGNOSIS — K219 Gastro-esophageal reflux disease without esophagitis: Secondary | ICD-10-CM | POA: Diagnosis present

## 2023-10-15 DIAGNOSIS — I080 Rheumatic disorders of both mitral and aortic valves: Secondary | ICD-10-CM | POA: Diagnosis present

## 2023-10-15 DIAGNOSIS — I5022 Chronic systolic (congestive) heart failure: Secondary | ICD-10-CM | POA: Diagnosis present

## 2023-10-15 DIAGNOSIS — Z7902 Long term (current) use of antithrombotics/antiplatelets: Secondary | ICD-10-CM

## 2023-10-15 DIAGNOSIS — I70221 Atherosclerosis of native arteries of extremities with rest pain, right leg: Secondary | ICD-10-CM | POA: Diagnosis present

## 2023-10-15 DIAGNOSIS — Z8249 Family history of ischemic heart disease and other diseases of the circulatory system: Secondary | ICD-10-CM | POA: Diagnosis not present

## 2023-10-15 DIAGNOSIS — E739 Lactose intolerance, unspecified: Secondary | ICD-10-CM | POA: Diagnosis present

## 2023-10-15 DIAGNOSIS — I495 Sick sinus syndrome: Secondary | ICD-10-CM | POA: Diagnosis present

## 2023-10-15 DIAGNOSIS — Z7901 Long term (current) use of anticoagulants: Secondary | ICD-10-CM | POA: Diagnosis not present

## 2023-10-15 DIAGNOSIS — D509 Iron deficiency anemia, unspecified: Secondary | ICD-10-CM | POA: Diagnosis present

## 2023-10-15 DIAGNOSIS — D75839 Thrombocytosis, unspecified: Secondary | ICD-10-CM | POA: Diagnosis present

## 2023-10-15 DIAGNOSIS — M159 Polyosteoarthritis, unspecified: Secondary | ICD-10-CM | POA: Diagnosis present

## 2023-10-15 DIAGNOSIS — D62 Acute posthemorrhagic anemia: Secondary | ICD-10-CM | POA: Diagnosis not present

## 2023-10-15 DIAGNOSIS — Z8619 Personal history of other infectious and parasitic diseases: Secondary | ICD-10-CM

## 2023-10-15 DIAGNOSIS — Z87891 Personal history of nicotine dependence: Secondary | ICD-10-CM

## 2023-10-15 DIAGNOSIS — I739 Peripheral vascular disease, unspecified: Secondary | ICD-10-CM | POA: Diagnosis not present

## 2023-10-15 DIAGNOSIS — I788 Other diseases of capillaries: Secondary | ICD-10-CM | POA: Diagnosis not present

## 2023-10-15 DIAGNOSIS — Z89511 Acquired absence of right leg below knee: Principal | ICD-10-CM | POA: Insufficient documentation

## 2023-10-15 DIAGNOSIS — D72829 Elevated white blood cell count, unspecified: Secondary | ICD-10-CM | POA: Diagnosis not present

## 2023-10-15 DIAGNOSIS — Z79899 Other long term (current) drug therapy: Secondary | ICD-10-CM

## 2023-10-15 DIAGNOSIS — Z89611 Acquired absence of right leg above knee: Principal | ICD-10-CM

## 2023-10-15 DIAGNOSIS — Z8701 Personal history of pneumonia (recurrent): Secondary | ICD-10-CM | POA: Diagnosis not present

## 2023-10-15 DIAGNOSIS — Z90710 Acquired absence of both cervix and uterus: Secondary | ICD-10-CM

## 2023-10-15 DIAGNOSIS — D649 Anemia, unspecified: Secondary | ICD-10-CM | POA: Diagnosis present

## 2023-10-15 DIAGNOSIS — R040 Epistaxis: Secondary | ICD-10-CM | POA: Diagnosis not present

## 2023-10-15 DIAGNOSIS — M109 Gout, unspecified: Secondary | ICD-10-CM | POA: Diagnosis present

## 2023-10-15 HISTORY — PX: AMPUTATION: SHX166

## 2023-10-15 HISTORY — DX: Unspecified atrial fibrillation: I48.91

## 2023-10-15 HISTORY — DX: Unspecified dementia, unspecified severity, without behavioral disturbance, psychotic disturbance, mood disturbance, and anxiety: F03.90

## 2023-10-15 LAB — COMPREHENSIVE METABOLIC PANEL WITH GFR
ALT: 13 U/L (ref 0–44)
AST: 20 U/L (ref 15–41)
Albumin: 2.7 g/dL — ABNORMAL LOW (ref 3.5–5.0)
Alkaline Phosphatase: 78 U/L (ref 38–126)
Anion gap: 10 (ref 5–15)
BUN: 33 mg/dL — ABNORMAL HIGH (ref 8–23)
CO2: 26 mmol/L (ref 22–32)
Calcium: 9.6 mg/dL (ref 8.9–10.3)
Chloride: 102 mmol/L (ref 98–111)
Creatinine, Ser: 0.95 mg/dL (ref 0.44–1.00)
GFR, Estimated: >60 mL/min (ref >60)
Glucose, Bld: 127 mg/dL — ABNORMAL HIGH (ref 70–99)
Potassium: 4.3 mmol/L (ref 3.5–5.1)
Sodium: 138 mmol/L (ref 135–145)
Total Bilirubin: 0.6 mg/dL (ref 0.0–1.2)
Total Protein: 8 g/dL (ref 6.5–8.1)

## 2023-10-15 LAB — TYPE AND SCREEN
ABO/RH(D): B POS
Antibody Screen: NEGATIVE

## 2023-10-15 LAB — CREATININE, SERUM
Creatinine, Ser: 0.87 mg/dL (ref 0.44–1.00)
GFR, Estimated: >60 mL/min (ref >60)

## 2023-10-15 LAB — CBC
HCT: 30.5 % — ABNORMAL LOW (ref 36.0–46.0)
Hemoglobin: 9.5 g/dL — ABNORMAL LOW (ref 12.0–15.0)
MCH: 22.6 pg — ABNORMAL LOW (ref 26.0–34.0)
MCHC: 31.1 g/dL (ref 30.0–36.0)
MCV: 72.6 fL — ABNORMAL LOW (ref 80.0–100.0)
Platelets: 636 10*3/uL — ABNORMAL HIGH (ref 150–400)
RBC: 4.2 MIL/uL (ref 3.87–5.11)
RDW: 19 % — ABNORMAL HIGH (ref 11.5–15.5)
WBC: 14.2 10*3/uL — ABNORMAL HIGH (ref 4.0–10.5)
nRBC: 0 % (ref 0.0–0.2)

## 2023-10-15 LAB — PROTIME-INR
INR: 1.2 (ref 0.8–1.2)
Prothrombin Time: 15 s (ref 11.4–15.2)

## 2023-10-15 LAB — APTT: aPTT: 32 s (ref 24–36)

## 2023-10-15 SURGERY — AMPUTATION, ABOVE KNEE
Anesthesia: General | Site: Knee | Laterality: Right

## 2023-10-15 MED ORDER — AMISULPRIDE (ANTIEMETIC) 5 MG/2ML IV SOLN
10.0000 mg | Freq: Once | INTRAVENOUS | Status: DC | PRN
Start: 2023-10-15 — End: 2023-10-15

## 2023-10-15 MED ORDER — HYDROMORPHONE HCL 1 MG/ML IJ SOLN: 0.5000 mg | INTRAMUSCULAR | Status: DC | PRN

## 2023-10-15 MED ORDER — PROPOFOL 10 MG/ML IV BOLUS
INTRAVENOUS | Status: DC | PRN
  Administered 2023-10-15: 50 mg via INTRAVENOUS
  Administered 2023-10-15: 20 mg via INTRAVENOUS
  Administered 2023-10-15: 50 mg via INTRAVENOUS

## 2023-10-15 MED ORDER — MAGNESIUM SULFATE 2 GM/50ML IV SOLN: 2.0000 g | Freq: Every day | INTRAVENOUS | Status: DC | PRN

## 2023-10-15 MED ORDER — OXYCODONE HCL 5 MG PO TABS: 5.0000 mg | ORAL_TABLET | Freq: Once | ORAL | Status: DC | PRN

## 2023-10-15 MED ORDER — DEXAMETHASONE SODIUM PHOSPHATE 10 MG/ML IJ SOLN
INTRAMUSCULAR | Status: DC | PRN
  Administered 2023-10-15: 5 mg via INTRAVENOUS

## 2023-10-15 MED ORDER — CHLORHEXIDINE GLUCONATE 0.12 % MT SOLN
OROMUCOSAL | Status: AC
  Administered 2023-10-15: 15 mL via OROMUCOSAL
  Filled 2023-10-15: qty 15

## 2023-10-15 MED ORDER — BACITRACIN ZINC 500 UNIT/GM EX OINT
TOPICAL_OINTMENT | CUTANEOUS | Status: AC
Start: 2023-10-15 — End: ?
  Filled 2023-10-15: qty 28.35

## 2023-10-15 MED ORDER — METOPROLOL TARTRATE 12.5 MG HALF TABLET
12.5000 mg | ORAL_TABLET | Freq: Two times a day (BID) | ORAL | Status: DC
  Administered 2023-10-15 – 2023-10-20 (×8): 12.5 mg via ORAL
  Filled 2023-10-15 (×10): qty 1

## 2023-10-15 MED ORDER — CHLORHEXIDINE GLUCONATE CLOTH 2 % EX PADS: 6.0000 | MEDICATED_PAD | Freq: Once | CUTANEOUS | Status: DC

## 2023-10-15 MED ORDER — FENTANYL CITRATE (PF) 250 MCG/5ML IJ SOLN
INTRAMUSCULAR | Status: AC
  Filled 2023-10-15: qty 5

## 2023-10-15 MED ORDER — FENTANYL CITRATE (PF) 250 MCG/5ML IJ SOLN
INTRAMUSCULAR | Status: DC | PRN
Start: 2023-10-15 — End: 2023-10-15
  Administered 2023-10-15: 50 ug via INTRAVENOUS
  Administered 2023-10-15 (×6): 25 ug via INTRAVENOUS

## 2023-10-15 MED ORDER — PROPOFOL 10 MG/ML IV BOLUS
INTRAVENOUS | Status: AC
  Filled 2023-10-15: qty 20

## 2023-10-15 MED ORDER — PHENYLEPHRINE 80 MCG/ML (10ML) SYRINGE FOR IV PUSH (FOR BLOOD PRESSURE SUPPORT)
PREFILLED_SYRINGE | INTRAVENOUS | Status: AC
Start: 2023-10-15 — End: ?
  Filled 2023-10-15: qty 10

## 2023-10-15 MED ORDER — CEFAZOLIN SODIUM-DEXTROSE 2-4 GM/100ML-% IV SOLN
2.0000 g | INTRAVENOUS | Status: AC
  Administered 2023-10-15: 2 g via INTRAVENOUS
  Filled 2023-10-15: qty 100

## 2023-10-15 MED ORDER — EPHEDRINE 5 MG/ML INJ
INTRAVENOUS | Status: AC
Start: 2023-10-15 — End: ?
  Filled 2023-10-15: qty 5

## 2023-10-15 MED ORDER — PANTOPRAZOLE SODIUM 40 MG PO TBEC
40.0000 mg | DELAYED_RELEASE_TABLET | Freq: Every day | ORAL | Status: DC
  Administered 2023-10-15 – 2023-10-20 (×6): 40 mg via ORAL
  Filled 2023-10-15 (×6): qty 1

## 2023-10-15 MED ORDER — ONDANSETRON HCL 4 MG/2ML IJ SOLN
INTRAMUSCULAR | Status: DC | PRN
  Administered 2023-10-15: 4 mg via INTRAVENOUS

## 2023-10-15 MED ORDER — PHENOL 1.4 % MT LIQD: 1.0000 | OROMUCOSAL | Status: DC | PRN

## 2023-10-15 MED ORDER — OXYCODONE HCL 5 MG/5ML PO SOLN: 5.0000 mg | Freq: Once | ORAL | Status: DC | PRN

## 2023-10-15 MED ORDER — CYCLOBENZAPRINE HCL 10 MG PO TABS
10.0000 mg | ORAL_TABLET | Freq: Every day | ORAL | Status: DC
  Administered 2023-10-15 – 2023-10-19 (×5): 10 mg via ORAL
  Filled 2023-10-15 (×5): qty 1

## 2023-10-15 MED ORDER — SODIUM CHLORIDE 0.9 % IV SOLN: INTRAVENOUS | Status: DC

## 2023-10-15 MED ORDER — CALCIUM CARBONATE ANTACID 500 MG PO CHEW: 1.0000 | CHEWABLE_TABLET | Freq: Every day | ORAL | Status: DC

## 2023-10-15 MED ORDER — FERROUS SULFATE 325 (65 FE) MG PO TABS
325.0000 mg | ORAL_TABLET | Freq: Every day | ORAL | Status: DC
  Administered 2023-10-16 – 2023-10-20 (×5): 325 mg via ORAL
  Filled 2023-10-15 (×5): qty 1

## 2023-10-15 MED ORDER — LIDOCAINE 2% (20 MG/ML) 5 ML SYRINGE
INTRAMUSCULAR | Status: DC | PRN
  Administered 2023-10-15: 40 mg via INTRAVENOUS

## 2023-10-15 MED ORDER — ONDANSETRON HCL 4 MG/2ML IJ SOLN
INTRAMUSCULAR | Status: AC
Start: 2023-10-15 — End: ?
  Filled 2023-10-15: qty 2

## 2023-10-15 MED ORDER — BACITRACIN ZINC 500 UNIT/GM EX OINT
TOPICAL_OINTMENT | CUTANEOUS | Status: DC | PRN
  Administered 2023-10-15: 1 via TOPICAL

## 2023-10-15 MED ORDER — PHENYLEPHRINE 80 MCG/ML (10ML) SYRINGE FOR IV PUSH (FOR BLOOD PRESSURE SUPPORT)
PREFILLED_SYRINGE | INTRAVENOUS | Status: DC | PRN
  Administered 2023-10-15: 120 ug via INTRAVENOUS
  Administered 2023-10-15: 240 ug via INTRAVENOUS
  Administered 2023-10-15 (×3): 200 ug via INTRAVENOUS
  Administered 2023-10-15: 160 ug via INTRAVENOUS
  Administered 2023-10-15: 80 ug via INTRAVENOUS
  Administered 2023-10-15 (×2): 120 ug via INTRAVENOUS
  Administered 2023-10-15: 160 ug via INTRAVENOUS

## 2023-10-15 MED ORDER — ACETAMINOPHEN 325 MG PO TABS
650.0000 mg | ORAL_TABLET | Freq: Four times a day (QID) | ORAL | Status: DC | PRN
  Filled 2023-10-15: qty 2

## 2023-10-15 MED ORDER — ORAL CARE MOUTH RINSE: 15.0000 mL | Freq: Once | OROMUCOSAL | Status: AC

## 2023-10-15 MED ORDER — ALLOPURINOL 100 MG PO TABS
100.0000 mg | ORAL_TABLET | Freq: Every evening | ORAL | Status: DC
  Administered 2023-10-15 – 2023-10-19 (×5): 100 mg via ORAL
  Filled 2023-10-15 (×5): qty 1

## 2023-10-15 MED ORDER — CLOPIDOGREL BISULFATE 75 MG PO TABS
75.0000 mg | ORAL_TABLET | Freq: Every day | ORAL | Status: DC
  Administered 2023-10-16 – 2023-10-18 (×3): 75 mg via ORAL
  Filled 2023-10-15 (×3): qty 1

## 2023-10-15 MED ORDER — 0.9 % SODIUM CHLORIDE (POUR BTL) OPTIME
TOPICAL | Status: DC | PRN
  Administered 2023-10-15: 1000 mL

## 2023-10-15 MED ORDER — HYDROXYZINE HCL 25 MG PO TABS: 25.0000 mg | ORAL_TABLET | Freq: Every evening | ORAL | Status: DC | PRN

## 2023-10-15 MED ORDER — EPHEDRINE SULFATE-NACL 50-0.9 MG/10ML-% IV SOSY
PREFILLED_SYRINGE | INTRAVENOUS | Status: DC | PRN
  Administered 2023-10-15: 7.5 mg via INTRAVENOUS

## 2023-10-15 MED ORDER — LACTATED RINGERS IV SOLN: INTRAVENOUS | Status: DC

## 2023-10-15 MED ORDER — POTASSIUM CHLORIDE CRYS ER 20 MEQ PO TBCR: 20.0000 meq | EXTENDED_RELEASE_TABLET | Freq: Every day | ORAL | Status: DC | PRN

## 2023-10-15 MED ORDER — DOCUSATE SODIUM 100 MG PO CAPS
100.0000 mg | ORAL_CAPSULE | Freq: Every day | ORAL | Status: DC
  Administered 2023-10-16 – 2023-10-20 (×5): 100 mg via ORAL
  Filled 2023-10-15 (×5): qty 1

## 2023-10-15 MED ORDER — HEPARIN SODIUM (PORCINE) 5000 UNIT/ML IJ SOLN
5000.0000 [IU] | Freq: Three times a day (TID) | INTRAMUSCULAR | Status: DC
Start: 2023-10-16 — End: 2023-10-18
  Administered 2023-10-16 – 2023-10-18 (×7): 5000 [IU] via SUBCUTANEOUS
  Filled 2023-10-15 (×7): qty 1

## 2023-10-15 MED ORDER — CHLORHEXIDINE GLUCONATE 0.12 % MT SOLN: 15.0000 mL | Freq: Once | OROMUCOSAL | Status: AC

## 2023-10-15 MED ORDER — ACETAMINOPHEN 650 MG RE SUPP: 650.0000 mg | Freq: Four times a day (QID) | RECTAL | Status: DC | PRN

## 2023-10-15 MED ORDER — CEFAZOLIN SODIUM-DEXTROSE 2-4 GM/100ML-% IV SOLN
2.0000 g | Freq: Three times a day (TID) | INTRAVENOUS | Status: AC
  Administered 2023-10-15 (×2): 2 g via INTRAVENOUS
  Filled 2023-10-15 (×2): qty 100

## 2023-10-15 MED ORDER — TORSEMIDE 10 MG PO TABS
10.0000 mg | ORAL_TABLET | Freq: Every day | ORAL | Status: DC
  Administered 2023-10-16 – 2023-10-20 (×5): 10 mg via ORAL
  Filled 2023-10-15 (×6): qty 1

## 2023-10-15 MED ORDER — CALCIUM CARBONATE 1250 (500 CA) MG PO TABS
1.0000 | ORAL_TABLET | Freq: Every day | ORAL | Status: DC
  Administered 2023-10-16 – 2023-10-20 (×5): 1250 mg via ORAL
  Filled 2023-10-15 (×5): qty 1

## 2023-10-15 MED ORDER — OXYCODONE-ACETAMINOPHEN 5-325 MG PO TABS
1.0000 | ORAL_TABLET | ORAL | Status: DC | PRN
  Administered 2023-10-15 – 2023-10-20 (×7): 1 via ORAL
  Filled 2023-10-15 (×7): qty 1

## 2023-10-15 MED ORDER — ALBUTEROL SULFATE (2.5 MG/3ML) 0.083% IN NEBU: 2.5000 mg | INHALATION_SOLUTION | RESPIRATORY_TRACT | Status: DC | PRN

## 2023-10-15 MED ORDER — AMLODIPINE BESYLATE 5 MG PO TABS
5.0000 mg | ORAL_TABLET | Freq: Every day | ORAL | Status: DC
  Administered 2023-10-18 – 2023-10-20 (×3): 5 mg via ORAL
  Filled 2023-10-15 (×5): qty 1

## 2023-10-15 MED ORDER — NICOTINE 21 MG/24HR TD PT24
21.0000 mg | MEDICATED_PATCH | Freq: Every day | TRANSDERMAL | Status: DC
  Administered 2023-10-16 – 2023-10-20 (×4): 21 mg via TRANSDERMAL
  Filled 2023-10-15 (×4): qty 1

## 2023-10-15 MED ORDER — HYDROMORPHONE HCL 1 MG/ML IJ SOLN: 0.2500 mg | INTRAMUSCULAR | Status: DC | PRN

## 2023-10-15 MED ORDER — DEXAMETHASONE SODIUM PHOSPHATE 10 MG/ML IJ SOLN
INTRAMUSCULAR | Status: AC
  Filled 2023-10-15: qty 1

## 2023-10-15 MED ORDER — PHENYLEPHRINE 80 MCG/ML (10ML) SYRINGE FOR IV PUSH (FOR BLOOD PRESSURE SUPPORT)
PREFILLED_SYRINGE | INTRAVENOUS | Status: AC
  Filled 2023-10-15: qty 10

## 2023-10-15 MED ORDER — SODIUM CHLORIDE 0.9 % IV SOLN: 12.5000 mg | INTRAVENOUS | Status: DC | PRN

## 2023-10-15 MED ORDER — LIDOCAINE 2% (20 MG/ML) 5 ML SYRINGE
INTRAMUSCULAR | Status: AC
Start: 2023-10-15 — End: ?
  Filled 2023-10-15: qty 5

## 2023-10-15 SURGICAL SUPPLY — 47 items
BAG COUNTER SPONGE SURGICOUNT (BAG) ×1 IMPLANT
BANDAGE ESMARK 6X9 LF (GAUZE/BANDAGES/DRESSINGS) ×1 IMPLANT
BLADE SAGITTAL 25.0X1.19X90 (BLADE) IMPLANT
BLADE SAW GIGLI 510 (BLADE) ×1 IMPLANT
BLADE SAW THK.89X75X18XSGTL (BLADE) IMPLANT
BNDG COHESIVE 6X5 TAN ST LF (GAUZE/BANDAGES/DRESSINGS) ×1 IMPLANT
BNDG ELASTIC 4X5.8 VLCR STR LF (GAUZE/BANDAGES/DRESSINGS) ×1 IMPLANT
BNDG ELASTIC 6INX 5YD STR LF (GAUZE/BANDAGES/DRESSINGS) ×1 IMPLANT
BNDG ELASTIC 6X10 VLCR STRL LF (GAUZE/BANDAGES/DRESSINGS) IMPLANT
BNDG GAUZE DERMACEA FLUFF 4 (GAUZE/BANDAGES/DRESSINGS) ×1 IMPLANT
BUR DISC 0.8X25 (BURR) IMPLANT
CANISTER SUCTION 3000ML PPV (SUCTIONS) ×1 IMPLANT
CLIP LIGATING EXTRA MED SLVR (CLIP) ×1 IMPLANT
CLIP LIGATING EXTRA SM BLUE (MISCELLANEOUS) ×1 IMPLANT
COVER SURGICAL LIGHT HANDLE (MISCELLANEOUS) ×1 IMPLANT
CUFF TRNQT CYL 24X4X16.5-23 (TOURNIQUET CUFF) IMPLANT
CUFF TRNQT CYL 34X4.125X (TOURNIQUET CUFF) IMPLANT
DRAIN CHANNEL 19F RND (DRAIN) IMPLANT
DRAPE DERMATAC (DRAPES) IMPLANT
DRAPE HALF SHEET 40X57 (DRAPES) ×1 IMPLANT
DRAPE INCISE IOBAN 66X45 STRL (DRAPES) IMPLANT
DRAPE SURG ORHT 6 SPLT 77X108 (DRAPES) ×2 IMPLANT
DRSG ADAPTIC 3X8 NADH LF (GAUZE/BANDAGES/DRESSINGS) ×1 IMPLANT
ELECT CAUTERY BLADE 6.4 (BLADE) ×1 IMPLANT
ELECTRODE REM PT RTRN 9FT ADLT (ELECTROSURGICAL) ×1 IMPLANT
EVACUATOR SILICONE 100CC (DRAIN) IMPLANT
GAUZE SPONGE 4X4 12PLY STRL (GAUZE/BANDAGES/DRESSINGS) ×1 IMPLANT
GLOVE BIO SURGEON STRL SZ7.5 (GLOVE) ×1 IMPLANT
GOWN STRL REUS W/ TWL LRG LVL3 (GOWN DISPOSABLE) ×2 IMPLANT
GOWN STRL REUS W/ TWL XL LVL3 (GOWN DISPOSABLE) ×1 IMPLANT
KIT BASIN OR (CUSTOM PROCEDURE TRAY) ×1 IMPLANT
KIT TURNOVER KIT B (KITS) ×1 IMPLANT
NS IRRIG 1000ML POUR BTL (IV SOLUTION) ×1 IMPLANT
PACK GENERAL/GYN (CUSTOM PROCEDURE TRAY) ×1 IMPLANT
PAD ARMBOARD POSITIONER FOAM (MISCELLANEOUS) ×2 IMPLANT
POWDER SURGICEL 3.0 GRAM (HEMOSTASIS) IMPLANT
RASP HELIOCORDIAL MED (MISCELLANEOUS) IMPLANT
STAPLER SKIN PROX 35W (STAPLE) ×1 IMPLANT
STOCKINETTE IMPERVIOUS LG (DRAPES) ×1 IMPLANT
SUT ETHILON 3 0 PS 1 (SUTURE) IMPLANT
SUT SILK 0 TIES 10X30 (SUTURE) ×1 IMPLANT
SUT SILK 2-0 18XBRD TIE 12 (SUTURE) ×1 IMPLANT
SUT SILK 3-0 18XBRD TIE 12 (SUTURE) IMPLANT
SUT VIC AB 2-0 CT1 18 (SUTURE) ×2 IMPLANT
TOWEL GREEN STERILE (TOWEL DISPOSABLE) ×2 IMPLANT
UNDERPAD 30X36 HEAVY ABSORB (UNDERPADS AND DIAPERS) ×1 IMPLANT
WATER STERILE IRR 1000ML POUR (IV SOLUTION) ×1 IMPLANT

## 2023-10-15 NOTE — Interval H&P Note (Signed)
 History and Physical Interval Note:  10/15/2023 7:25 AM  Holly Guerra  has presented today for surgery, with the diagnosis of athrosclerosis RLE with gangrene.  The various methods of treatment have been discussed with the patient and family. After consideration of risks, benefits and other options for treatment, the patient has consented to  Procedure(s): AMPUTATION, ABOVE KNEE (Right) as a surgical intervention.  The patient's history has been reviewed, patient examined, no change in status, stable for surgery.  I have reviewed the patient's chart and labs.  Questions were answered to the patient's satisfaction.     Angela Kell

## 2023-10-15 NOTE — Consult Note (Signed)
 Initial Consultation Note   Patient: Holly Guerra [facility] ZOX:[id number] DOB: 1940/05/31 PCP: Aviva Lemmings DOA: 10/15/2023 DOS: the patient was seen and examined on 10/15/2023 Primary service: Shera Diego*  Referring physician: Dr. 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 - Per vascular surgery  Leukocytosis Acute.  WBC elevated at 14.2.  Suspect reactive to gangrene noted. -Recheck CBC tomorrow morning  Essential hypertension Blood pressures postoperatively appear to be soft 101/63 to 106/61. - Continue amlodipine  and metoprolol  with holding parameters  Paroxysmal atrial fibrillation Patient appears to be in a sinus rhythm at this time. - Continue metoprolol  as tolerated - Touch base with vascular surgery and discuss when medically appropriate to resume Eliquis   Normocytic anemia Chronic.  Hemoglobin 9.5 with low MCV and MCH.  Patient on iron supplements at baseline.  -Check recheck CBC in a.m.  Chronic heart failure with mildly reduced EF Patient appears to be compensated.  EF noted to be 45 to 50% with grade 2 diastolic dysfunction.  - Continue torsemide - Continue to monitor volume status postoperatively  Peripheral artery disease - Continue Plavix   Dementia - Delirium precautions  GERD History of esophageal stricture Patient is status post EGD with dilation. - Continue Protonix   TRH will continue to follow the patient.  HPI: YISEL MEGILL is a 83 y.o. female with past medical history of hypertension, hyperlipidemia, atrial fibrillation, peripheral vascular disease who had been diagnosed with atherosclerosis of the right lower extremity with gangrene.  Patient was taken for a right above-knee amputation by Dr. Vikki Graves today.  Patient does not give additional history as she still very lethargic postoperatively.  Initial  labs revealed WBC 14.2, hemoglobin 9.5, platelets 636, BUN 33, creatinine 0.95, glucose 127.  Review of Systems: As mentioned in the history of present illness. All other systems reviewed and are negative. Past Medical History:  Diagnosis Date   Abdominal bruit    Abnormal echocardiogram    Anemia    Aortic insufficiency    Aortic root dilation (HCC)    Aortic valve disorder    Arthritis    "all over" (07/22/2012)   Arthropathy    Atrial fibrillation (HCC)    Bilateral carotid artery stenosis    Carotid artery disease (HCC)    Chest discomfort    Dementia (HCC)    Edema    Exertional shortness of breath    Fatigue    GERD (gastroesophageal reflux disease)    Gout    Hypercholesteremia    Hypertension    Mitral regurgitation and aortic stenosis    Mixed hyperlipidemia    Murmur, cardiac    Peripheral vascular disease (HCC)    Pernicious anemia    PVD (peripheral vascular disease) (HCC)    Shortness of breath    Sickle-cell trait (HCC)    Thoracic aortic aneurysm (HCC)    Tobacco use    URI (upper respiratory infection)    Past Surgical History:  Procedure Laterality Date   ABDOMINAL AORTOGRAM W/LOWER EXTREMITY Bilateral 05/23/2020   Procedure: ABDOMINAL AORTOGRAM W/LOWER EXTREMITY;  Surgeon: Avanell Leigh, MD;  Location: [facility] INVASIVE CV LAB;  Service: Cardiovascular;  Laterality: Bilateral;   ABDOMINAL HYSTERECTOMY  01/09/1969   APPENDECTOMY  01/09/1969   CARDIOVASCULAR STRESS TEST  06/16/2012   normal myocardial perfusion imaging, LV systolic function was normal without regional wall motion abnormalities, LV EF  60%   DOPPLER ECHOCARDIOGRAPHY  06/16/2012   EF 60-65%, mild concetric LVH, moderate aortic regurg   ESOPHAGOGASTRODUODENOSCOPY (EGD) WITH PROPOFOL  N/A 05/15/2023   Procedure: ESOPHAGOGASTRODUODENOSCOPY (EGD) WITH PROPOFOL ;  Surgeon: Alvis Jourdain, MD;  Location: [facility] ENDOSCOPY;  Service: Gastroenterology;  Laterality: N/A;   HIP ARTHROPLASTY Right 05/25/2023    Procedure: ARTHROPLASTY BIPOLAR HIP (HEMIARTHROPLASTY);  Surgeon: Arnie Lao, MD;  Location: [facility] OR;  Service: Orthopedics;  Laterality: Right;   LOWER EXTREMITY ANGIOGRAM N/A 07/22/2012   Procedure: LOWER EXTREMITY ANGIOGRAM;  Surgeon: Avanell Leigh, MD;  Location: [facility] CATH LAB;  Service: Cardiovascular;  Laterality: N/A;   LOWER EXTREMITY ARTERIAL DOPPLER  08/08/2012   Left SFA stent-open and patent without evidence of hemodynamically significant stenosis   PERCUTANEOUS STENT INTERVENTION Left 07/22/2012   SAVORY DILATION N/A 05/15/2023   Procedure: SAVORY DILATION;  Surgeon: Alvis Jourdain, MD;  Location: [facility] ENDOSCOPY;  Service: Gastroenterology;  Laterality: N/A;  16mm   TOTAL THYROIDECTOMY  ~ 2010   Social History:  reports that she quit smoking about 16 years ago. Her smoking use included cigarettes. She started smoking about 61 years ago. She has a 22.5 pack-year smoking history. She has never used smokeless tobacco. She reports that she does not currently use alcohol. She reports that she does not use drugs.  Allergies  Allergen Reactions   Lactose Intolerance (Gi) Diarrhea    Family History  Problem Relation Age of Onset   Hypertension Mother    Heart disease Mother     Prior to Admission medications   Medication Sig Start Date End Date Taking? Authorizing Provider  acetaminophen  (TYLENOL ) 500 MG tablet Take 1,000 mg by mouth every 6 (six) hours as needed for moderate pain (pain score 4-6).   Yes [provider]  allopurinol  (ZYLOPRIM ) 100 MG tablet Take 1 tablet (100 mg total) by mouth every evening. 05/19/23  Yes Pahwani, Martha Slack, MD  Amino Acids-Protein Hydrolys (FEEDING SUPPLEMENT, PRO-STAT 64,) LIQD Take 30 mLs by mouth 2 (two) times daily.   Yes [provider]  amLODipine  (NORVASC ) 5 MG tablet Take 5 mg by mouth daily.   Yes [provider]  apixaban  (ELIQUIS ) 2.5 MG TABS tablet Take 2.5 mg by mouth 2 (two) times daily.   Yes  [provider]  apixaban  (ELIQUIS ) 2.5 MG TABS tablet Take 2.5 mg by mouth 2 (two) times daily.   Yes [provider]  calcium  carbonate (TUMS - DOSED IN MG ELEMENTAL CALCIUM ) 500 MG chewable tablet Chew 1 tablet by mouth daily.   Yes [provider]  Calcium  Carbonate Antacid (CALCIUM  CARBONATE PO) Take 1 tablet by mouth daily.   Yes [provider]  clopidogrel  (PLAVIX ) 75 MG tablet Take 1 tablet (75 mg total) by mouth daily. 07/24/12  Yes Ruddy Corral M, PA-C  cyanocobalamin  (VITAMIN B12) 1000 MCG/ML injection Inject 1 mL (1,000 mcg total) into the muscle every 30 (thirty) days. 05/27/23  Yes Armenta Landau, MD  cyclobenzaprine  (FLEXERIL ) 10 MG tablet Take 10 mg by mouth at bedtime.   Yes [provider]  ferrous sulfate  325 (65 FE) MG tablet Take 325 mg by mouth daily with breakfast.   Yes [provider]  fluticasone (FLONASE) 50 MCG/ACT nasal spray Place 1 spray into both nostrils daily.   Yes [provider]  hydrOXYzine (ATARAX) 25 MG tablet Take 25 mg by mouth at bedtime as needed for anxiety.   Yes [provider]  ibuprofen  (ADVIL ) 200 MG tablet Take  200 mg by mouth every 6 (six) hours as needed for moderate pain (pain score 4-6) or mild pain (pain score 1-3).   Yes [provider]  ibuprofen  (ADVIL ) 200 MG tablet Take 200 mg by mouth every 6 (six) hours as needed for moderate pain (pain score 4-6).   Yes [provider]  metoprolol  tartrate (LOPRESSOR ) 25 MG tablet Take 12.5 mg by mouth 2 (two) times daily.   Yes [provider]  Multiple Vitamins-Minerals (MULTIVITAMIN WITH MINERALS) tablet Take 1 tablet by mouth daily.   Yes [provider]  Multiple Vitamins-Minerals (MULTIVITAMIN WITH MINERALS) tablet Take 1 tablet by mouth daily.   Yes [provider]  nicotine  (NICODERM CQ  - DOSED IN MG/24 HOURS) 21 mg/24hr patch Place 21 mg onto the skin daily.   Yes  [provider]  omeprazole (PRILOSEC) 40 MG capsule Take 40 mg by mouth every evening.   Yes [provider]  thiamine  (VITAMIN B1) 100 MG tablet Take 100 mg by mouth daily.   Yes [provider]  torsemide (DEMADEX) 10 MG tablet Take 10 mg by mouth daily.   Yes [provider]  Vitamin D, Ergocalciferol, (DRISDOL) 1.25 MG (50000 UNIT) CAPS capsule Take 50,000 Units by mouth every 7 (seven) days.   Yes [provider]  nicotine  (NICODERM CQ  - DOSED IN MG/24 HR) 7 mg/24hr patch Place 1 patch (7 mg total) onto the skin daily. Patient not taking: Reported on 10/13/2023 07/07/23   Carie Charity, NP    Physical Exam: Vitals:   10/15/23 0553 10/15/23 0627  BP: (!) 150/90   Pulse:  88  Resp:  16  Temp: 98.2 F (36.8 C)   TempSrc: Oral   SpO2:  98%  Weight: 40.8 kg   Height: 5\' 2"  (1.575 m)    Constitutional: Thin elderly female who is in no acute distress at this time.  Patient is lethargic but will awaken  eyes: PERRL, lids and conjunctivae normal ENMT: Mucous membranes are moist   Neck: normal, supple  Respiratory: clear to auscultation bilaterally, no wheezing, no crackles. Normal respiratory effort. No accessory muscle use.  Cardiovascular: Regular rate and rhythm, positive systolic murmur present.  2+ pedal pulses. No carotid bruits.  Abdomen: no tenderness, no masses palpated. . Bowel sounds positive.  Musculoskeletal: no clubbing / cyanosis.  Right AKA. Skin: no rashes, lesions, ulcers. No induration Neurologic: CN 2-12 grossly intact.   Appears able to move all extremities. Psychiatric:  Normal mood.   Data Reviewed:      Family Communication: none Primary team communication:   Thank you very much for involving us  in the care of your patient.  Author: Lena Qualia, MD 10/15/2023 9:15 AM  For on call review [email or web address].

## 2023-10-15 NOTE — Op Note (Signed)
    Patient name: TZIPORAH KNOKE MRN: [id number] DOB: Oct 24, 1940 Sex: female  10/15/2023 Pre-operative Diagnosis: Nonviable right lower extremity Post-operative diagnosis:  Same Surgeon:  Ace Holder C. Vikki Graves, MD Assistant: Donivan Furry, MS3 Procedure Performed:  Right above-knee amputation  Indications: 83 year old female with nonviable right lower extremity.  She is not ambulatory.  We have discussed proceeding with right above-knee amputation and she demonstrates good understanding and agrees to proceed.  Findings: Adequate capillary bleeding in the wound bed to suspect reasonable healing.   Procedure:  The patient was identified in the holding area and taken to the operating room where she was placed upon operating table and general anesthesia was induced.  She was sterilely prepped and draped in the right lower extremity usual fashion, antibiotics were administered timeout was called.  A standard fishmouth type incision was made above the knee.  We dissected down through the skin subcutaneous tissue and muscle down to the level of the femur.  This was transected with a Gigli.  The neurovascular bundle was clamped and divided and the posterior flap was created.  The neurovascular bundle was oversewn with 2-0 Vicryl suture.  The bone was smoothed with rasp.  Hemostasis was obtained with cautery.  We reapproximated the anterior and posterior fascia with interrupted Vicryl suture and the skin was closed with staples.  A sterile dressing was applied.  She was awakened from anesthesia having tolerated the procedure without any complication.  All counts were correct at completion.  EBL: 50cc  Gaetano Romberger C. Vikki Graves, MD [facility] [place] Office: [phone] Pager: [phone]

## 2023-10-15 NOTE — Progress Notes (Signed)
 Patient had telemetry on and no orders B Boston Byers NP [facility] notified and was inquired if cardiac monitoring should be continued was informed via secure chat if no order discontinue monitor was removed and CMT was notified

## 2023-10-15 NOTE — Anesthesia Postprocedure Evaluation (Signed)
 Anesthesia Post Note  Patient: Holly Guerra  Procedure(s) Performed: AMPUTATION, ABOVE KNEE (Right: Knee)     Patient location during evaluation: PACU Anesthesia Type: General Level of consciousness: awake and alert Pain management: pain level controlled Vital Signs Assessment: post-procedure vital signs reviewed and stable Respiratory status: spontaneous breathing, nonlabored ventilation and respiratory function stable Cardiovascular status: blood pressure returned to baseline and stable Postop Assessment: no apparent nausea or vomiting Anesthetic complications: no   No notable events documented.  Last Vitals:  Vitals:   10/15/23 0930 10/15/23 0945  BP: 98/63 113/66  Pulse:    Resp: 14 15  Temp:    SpO2:      Last Pain:  Vitals:   10/15/23 0914  TempSrc:   PainSc: Asleep                 Earvin Goldberg

## 2023-10-15 NOTE — Transfer of Care (Signed)
 Immediate Anesthesia Transfer of Care Note  Patient: Holly Guerra  Procedure(s) Performed: AMPUTATION, ABOVE KNEE (Right: Knee)  Patient Location: PACU  Anesthesia Type:General  Level of Consciousness: drowsy  Airway & Oxygen Therapy: Patient Spontanous Breathing and Patient connected to face mask oxygen  Post-op Assessment: Report given to RN and Post -op Vital signs reviewed and stable  Post vital signs: Reviewed and stable  Last Vitals:  Vitals Value Taken Time  BP 106/61 10/15/23 0913  Temp    Pulse 88 10/15/23 0915  Resp 12 10/15/23 0915  SpO2 100% 10/15/23 0916  Vitals shown include unfiled device data.  Last Pain:  Vitals:   10/15/23 0553  TempSrc: Oral         Complications: No notable events documented.

## 2023-10-15 NOTE — Anesthesia Procedure Notes (Signed)
 Procedure Name: LMA Insertion Date/Time: 10/15/2023 7:54 AM  Performed by: Candance Certain, CRNAPre-anesthesia Checklist: Patient identified, Emergency Drugs available, Suction available and Patient being monitored Patient Re-evaluated:Patient Re-evaluated prior to induction Oxygen Delivery Method: Circle System Utilized Preoxygenation: Pre-oxygenation with 100% oxygen Induction Type: IV induction Ventilation: Mask ventilation without difficulty LMA: LMA inserted LMA Size: 4.0 and 3.0 Number of attempts: 1 Placement Confirmation: positive ETCO2 Tube secured with: Tape Dental Injury: Teeth and Oropharynx as per pre-operative assessment  Comments: Some difficulty getting LMA to advance past hypopharynx. However, with jaw thrust, able to easily advance LMA. Dentition and oral mucosa as per preop.

## 2023-10-15 NOTE — Progress Notes (Signed)
 Dr. Annabell Key made aware that the patient has dementia and the patient's daughter is at the bedside. When asked about pain the patient c/o pain in her left arm pit. Asked the patient if it was chest pain or if it feels like a pulled muscle and she said both. Per the patient's daughter and granddaughter the patient has been complaining of left under arm pain for about 2 weeks. Patient denies SOB. Ok per Dr. Annabell Key. Dr. Annabell Key also made aware that the patient did not take any medications this morning including Metoprolol . Patient normally takes meds in applesauce per the daughter. Ok per Dr. Annabell Key.

## 2023-10-16 ENCOUNTER — Encounter (HOSPITAL_COMMUNITY): Payer: Self-pay | Admitting: Vascular Surgery

## 2023-10-16 DIAGNOSIS — I5022 Chronic systolic (congestive) heart failure: Secondary | ICD-10-CM | POA: Diagnosis not present

## 2023-10-16 DIAGNOSIS — Z89511 Acquired absence of right leg below knee: Secondary | ICD-10-CM | POA: Diagnosis not present

## 2023-10-16 DIAGNOSIS — I480 Paroxysmal atrial fibrillation: Secondary | ICD-10-CM | POA: Diagnosis not present

## 2023-10-16 DIAGNOSIS — Z89611 Acquired absence of right leg above knee: Secondary | ICD-10-CM

## 2023-10-16 DIAGNOSIS — D72829 Elevated white blood cell count, unspecified: Secondary | ICD-10-CM | POA: Diagnosis not present

## 2023-10-16 LAB — BASIC METABOLIC PANEL WITH GFR
Anion gap: 8 (ref 5–15)
BUN: 31 mg/dL — ABNORMAL HIGH (ref 8–23)
CO2: 25 mmol/L (ref 22–32)
Calcium: 7.9 mg/dL — ABNORMAL LOW (ref 8.9–10.3)
Chloride: 100 mmol/L (ref 98–111)
Creatinine, Ser: 1.08 mg/dL — ABNORMAL HIGH (ref 0.44–1.00)
GFR, Estimated: 52 mL/min — ABNORMAL LOW (ref >60)
Glucose, Bld: 98 mg/dL (ref 70–99)
Potassium: 4.7 mmol/L (ref 3.5–5.1)
Sodium: 133 mmol/L — ABNORMAL LOW (ref 135–145)

## 2023-10-16 LAB — CBC
HCT: 26.4 % — ABNORMAL LOW (ref 36.0–46.0)
Hemoglobin: 8.4 g/dL — ABNORMAL LOW (ref 12.0–15.0)
MCH: 23 pg — ABNORMAL LOW (ref 26.0–34.0)
MCHC: 31.8 g/dL (ref 30.0–36.0)
MCV: 72.1 fL — ABNORMAL LOW (ref 80.0–100.0)
Platelets: 555 10*3/uL — ABNORMAL HIGH (ref 150–400)
RBC: 3.66 MIL/uL — ABNORMAL LOW (ref 3.87–5.11)
RDW: 18.1 % — ABNORMAL HIGH (ref 11.5–15.5)
WBC: 12.3 10*3/uL — ABNORMAL HIGH (ref 4.0–10.5)
nRBC: 0 % (ref 0.0–0.2)

## 2023-10-16 MED ORDER — POLYETHYLENE GLYCOL 3350 17 G PO PACK
17.0000 g | PACK | Freq: Two times a day (BID) | ORAL | Status: AC
  Administered 2023-10-16 – 2023-10-17 (×4): 17 g via ORAL
  Filled 2023-10-16 (×4): qty 1

## 2023-10-16 MED ORDER — MELATONIN 3 MG PO TABS
3.0000 mg | ORAL_TABLET | Freq: Every day | ORAL | Status: DC
  Administered 2023-10-16 – 2023-10-19 (×4): 3 mg via ORAL
  Filled 2023-10-16 (×4): qty 1

## 2023-10-16 MED ORDER — HALOPERIDOL LACTATE 5 MG/ML IJ SOLN: 1.0000 mg | Freq: Four times a day (QID) | INTRAMUSCULAR | Status: DC | PRN

## 2023-10-16 MED ORDER — SODIUM CHLORIDE 0.9 % IV SOLN: INTRAVENOUS | Status: AC

## 2023-10-16 MED ORDER — SODIUM CHLORIDE 0.9% FLUSH
10.0000 mL | Freq: Two times a day (BID) | INTRAVENOUS | Status: DC
  Administered 2023-10-16 – 2023-10-20 (×8): 10 mL

## 2023-10-16 MED ORDER — SODIUM CHLORIDE 0.9% FLUSH: 10.0000 mL | INTRAVENOUS | Status: DC | PRN

## 2023-10-16 NOTE — Progress Notes (Addendum)
  Progress Note    10/16/2023 6:59 AM 1 Day Post-Op  Subjective:  sleeping, wakes easily, no complaints  Afebrile  Vitals:   10/15/23 2325 10/16/23 0432  BP: (!) 92/49 132/70  Pulse: 88 93  Resp: 16 18  Temp: 98.6 F (37 C) 98.5 F (36.9 C)  SpO2: 100% 99%    Physical Exam: Incisions:  ace wrap in place and clean and dry   CBC    Component Value Date/Time   WBC 12.3 (H) 10/16/2023 0459   RBC 3.66 (L) 10/16/2023 0459   HGB 8.4 (L) 10/16/2023 0459   HGB 10.7 (L) 05/21/2020 0930   HCT 26.4 (L) 10/16/2023 0459   HCT 37.0 05/21/2020 0930   PLT 555 (H) 10/16/2023 0459   PLT 367 05/21/2020 0930   MCV 72.1 (L) 10/16/2023 0459   MCV 72 (L) 05/21/2020 0930   MCH 23.0 (L) 10/16/2023 0459   MCHC 31.8 10/16/2023 0459   RDW 18.1 (H) 10/16/2023 0459   RDW 21.0 (H) 05/21/2020 0930   LYMPHSABS 0.8 05/23/2023 0015   MONOABS 0.9 05/23/2023 0015   EOSABS 0.1 05/23/2023 0015   BASOSABS 0.0 05/23/2023 0015    BMET    Component Value Date/Time   NA 133 (L) 10/16/2023 0459   NA 137 05/21/2020 0930   K 4.7 10/16/2023 0459   CL 100 10/16/2023 0459   CO2 25 10/16/2023 0459   GLUCOSE 98 10/16/2023 0459   BUN 31 (H) 10/16/2023 0459   BUN 8 05/21/2020 0930   CREATININE 1.08 (H) 10/16/2023 0459   CALCIUM  7.9 (L) 10/16/2023 0459   GFRNONAA 52 (L) 10/16/2023 0459   GFRAA 74 05/21/2020 0930    INR    Component Value Date/Time   INR 1.2 10/15/2023 0605     Intake/Output Summary (Last 24 hours) at 10/16/2023 0659 Last data filed at 10/15/2023 1200 Gross per 24 hour  Intake 850 ml  Output 40 ml  Net 810 ml     Assessment/Plan:  83 y.o. female is s/p right above knee amputation on 10/15/2023 by Dr. Vikki Graves 1 Day Post-Op   -dressing is clean and dry  -pain is well controlled -will take down dressing on Monday   Maryanna Smart, PA-C [facility] [phone] 10/16/2023 6:59 AM   VASCULAR STAFF ADDENDUM: I have independently interviewed and examined the  patient. I agree with the above.    Philipp Brawn MD [facility] Phone Number: [phone] 10/16/2023 8:55 AM

## 2023-10-16 NOTE — Plan of Care (Signed)
  Problem: Education: Goal: Knowledge of General Education information will improve Description: Including pain rating scale, medication(s)/side effects and non-pharmacologic comfort measures Outcome: Adequate for Discharge   Problem: Health Behavior/Discharge Planning: Goal: Ability to manage health-related needs will improve Outcome: Progressing   Problem: Clinical Measurements: Goal: Ability to maintain clinical measurements within normal limits will improve Outcome: Progressing Goal: Will remain free from infection Outcome: Progressing Goal: Diagnostic test results will improve Outcome: Progressing Goal: Respiratory complications will improve Outcome: Progressing Goal: Cardiovascular complication will be avoided Outcome: Progressing   Problem: Activity: Goal: Risk for activity intolerance will decrease Outcome: Progressing   Problem: Nutrition: Goal: Adequate nutrition will be maintained Outcome: Progressing   Problem: Coping: Goal: Level of anxiety will decrease Outcome: Progressing   Problem: Elimination: Goal: Will not experience complications related to bowel motility Outcome: Progressing Goal: Will not experience complications related to urinary retention Outcome: Progressing   Problem: Pain Managment: Goal: General experience of comfort will improve and/or be controlled Outcome: Progressing   Problem: Safety: Goal: Ability to remain free from injury will improve Outcome: Progressing

## 2023-10-16 NOTE — Progress Notes (Signed)

## 2023-10-16 NOTE — Progress Notes (Signed)
 [facility] CONSULT PROGRESS NOTE    Progress Note  Holly Guerra [facility]  HQI:[id number] DOB: 1940/07/29 DOA: 10/15/2023 PCP: Lajuanda Pile, PA-C     Brief Narrative:   Holly Guerra is an 83 y.o. female past medical history significant for essential hypertension, chronic atrial fibrillation on Eliquis , peripheral vascular disease, diagnosed with a right lower extremity gangrene patient was taken for above-the-knee right amputation and admission by Dr. Vikki Graves..  We have been consulted for medical management.  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 Further management per vascular surgery. Antibiotics per vascular surgery DVT prophylaxis and weightbearing per vascular surgery.  Leukocytosis Likely due to gangrene leukocytosis improving.  Essential hypertension Continue amlodipine  and metoprolol . Blood pressures relatively stable.  Microcytic anemia: Hemoglobin is stable at 8.5. MCV is low RDW is high will need further workup as an outpatient. Patient does not remember what was the last colonoscopy. Will need further evaluation as an outpatient.  Thrombocytosis: Likely reactive now improving.  PAF (paroxysmal atrial fibrillation) (HCC) He is to be rate controlled in sinus rhythm with metoprolol . Vascular surgery to dictate when to start Eliquis .  Chronic heart failure with mildly reduced ejection fraction (HFmrEF, 41-49%) (HCC) Renal function is slightly elevated this morning. Will hold diuretic therapy.  Peripheral artery disease (HCC) Continue Plavix .  Dementia (HCC) Continue home medications he is at risk of acute confusional state. Also had high risk of aspiration.  DVT prophylaxis: Lovenox Family Communication:none Status is: Inpatient Remains inpatient appropriate because: Right foot gangrene status post amputation    Code Status:     Code Status Orders  (From  admission, onward)           Start     Ordered   10/15/23 1047  Full code  Continuous       Question:  By:  Answer:  Consent: discussion documented in EHR   10/15/23 1050           Code Status History     Date Active Date Inactive Code Status Order ID Comments User Context   05/23/2023 0208 05/28/2023 0224 Limited: Do not attempt resuscitation (DNR) -DNR-LIMITED -Do Not Intubate/DNI  [id number]  Walton Guppy, MD ED   05/16/2023 1123 05/19/2023 2208 Limited: Do not attempt resuscitation (DNR) -DNR-LIMITED -Do Not Intubate/DNI  [id number]  Shellye Dibble, NP Inpatient   05/04/2023 0004 05/16/2023 1123 Full Code [id number]  Jetty Mort, MD Inpatient   05/23/2020 1104 05/23/2020 1939 Full Code [id number]  Avanell Leigh, MD Inpatient         IV Access:   Peripheral IV   Procedures and diagnostic studies:   No results found.   Medical Consultants:   None.   Subjective:    Holly Guerra she is complaining of right knee pain  Objective:    Vitals:   10/15/23 1300 10/15/23 1941 10/15/23 2325 10/16/23 0432  BP: 123/71 109/70 (!) 92/49 132/70  Pulse: 98 (!) 53 88 93  Resp: 18 18 16 18   Temp:  98.4 F (36.9 C) 98.6 F (37 C) 98.5 F (36.9 C)  TempSrc:  Oral Oral   SpO2: 100% 97% 100% 99%  Weight:      Height:       SpO2: 99 %   Intake/Output Summary (Last 24 hours) at 10/16/2023 9518 Last data filed at 10/15/2023 1200 Gross per 24 hour  Intake 850 ml  Output 40 ml  Net 810  ml   Filed Weights   10/15/23 0553  Weight: 40.8 kg    Exam: General exam: In no acute distress. Respiratory system: Good air movement and clear to auscultation. Cardiovascular system: S1 & S2 heard, RRR. No JVD. Gastrointestinal system: Abdomen is nondistended, soft and nontender.  Extremities: Below the knee amputation.  Leg is wrapped Skin: No rashes, lesions or ulcers Psychiatry: Judgement and insight appear normal. Mood & affect appropriate.   Data Reviewed:     Labs: Basic Metabolic Panel: Recent Labs  Lab 10/15/23 0605 10/16/23 0459  NA 138 133*  K 4.3 4.7  CL 102 100  CO2 26 25  GLUCOSE 127* 98  BUN 33* 31*  CREATININE 0.87  0.95 1.08*  CALCIUM  9.6 7.9*   GFR Estimated Creatinine Clearance: 26.3 mL/min (A) (by C-G formula based on SCr of 1.08 mg/dL (H)). Liver Function Tests: Recent Labs  Lab 10/15/23 0605  AST 20  ALT 13  ALKPHOS 78  BILITOT 0.6  PROT 8.0  ALBUMIN 2.7*   No results for input(s): "LIPASE", "AMYLASE" in the last 168 hours. No results for input(s): "AMMONIA" in the last 168 hours. Coagulation profile Recent Labs  Lab 10/15/23 0605  INR 1.2   COVID-19 Labs  No results for input(s): "DDIMER", "FERRITIN", "LDH", "CRP" in the last 72 hours.  Lab Results  Component Value Date   SARSCOV2NAA NEGATIVE 05/03/2023   SARSCOV2NAA NEGATIVE 05/21/2020    CBC: Recent Labs  Lab 10/15/23 0605 10/16/23 0459  WBC 14.2* 12.3*  HGB 9.5* 8.4*  HCT 30.5* 26.4*  MCV 72.6* 72.1*  PLT 636* 555*   Cardiac Enzymes: No results for input(s): "CKTOTAL", "CKMB", "CKMBINDEX", "TROPONINI" in the last 168 hours. BNP (last 3 results) No results for input(s): "PROBNP" in the last 8760 hours. CBG: No results for input(s): "GLUCAP" in the last 168 hours. D-Dimer: No results for input(s): "DDIMER" in the last 72 hours. Hgb A1c: No results for input(s): "HGBA1C" in the last 72 hours. Lipid Profile: No results for input(s): "CHOL", "HDL", "LDLCALC", "TRIG", "CHOLHDL", "LDLDIRECT" in the last 72 hours. Thyroid  function studies: No results for input(s): "TSH", "T4TOTAL", "T3FREE", "THYROIDAB" in the last 72 hours.  Invalid input(s): "FREET3" Anemia work up: No results for input(s): "VITAMINB12", "FOLATE", "FERRITIN", "TIBC", "IRON", "RETICCTPCT" in the last 72 hours. Sepsis Labs: Recent Labs  Lab 10/15/23 0605 10/16/23 0459  WBC 14.2* 12.3*   Microbiology No results found for this or any previous visit (from  the past 240 hours).   Medications:    allopurinol   100 mg Oral QPM   amLODipine   5 mg Oral Daily   calcium  carbonate  1 tablet Oral Q breakfast   clopidogrel   75 mg Oral Daily   cyclobenzaprine   10 mg Oral QHS   docusate sodium   100 mg Oral Daily   ferrous sulfate   325 mg Oral Q breakfast   heparin   5,000 Units Subcutaneous Q8H   metoprolol  tartrate  12.5 mg Oral BID   nicotine   21 mg Transdermal Daily   pantoprazole   40 mg Oral Daily   torsemide   10 mg Oral Daily   Continuous Infusions:  magnesium  sulfate bolus IVPB        LOS: 1 day   Macdonald Savoy  [facility]  10/16/2023, 7:22 AM

## 2023-10-16 NOTE — Evaluation (Signed)
 Physical Therapy Evaluation Patient Details Name: Holly Guerra MRN: [id number] DOB: 01/26/1941 Today's Date: 10/16/2023  History of Present Illness  Pt is an 83 y.o. female admitted 10/15/23 with R foot gangrene and underwent R AKA. PMH: HTN, PAD, CHF, PAF, dementia, fall Jan 2025 with R hip fx s/p THA  Clinical Impression  Pt admitted with above diagnosis. PTA pt resided at [facility]. Per chart, pt amb with RW but pt does have a wheelchair in her room. Pt currently with functional limitations due to the deficits listed below (see PT Problem List). On eval, pt presented with significant confusion. Disoriented to time, place, and situation. Very disgruntled and unable to re-orient/redirect. She required mod assist bed mobility and demo poor sitting balance. Pt without c/o pain. Pt will benefit from acute skilled PT to increase their independence and safety with mobility to allow discharge. Upon d/c, pt would benefit from return to SNF for further therapy.         If plan is discharge home, recommend the following: A lot of help with walking and/or transfers;A lot of help with bathing/dressing/bathroom   Can travel by private vehicle   No    Equipment Recommendations Other (comment) (TBD)  Recommendations for Other Services       Functional Status Assessment Patient has had a recent decline in their functional status and demonstrates the ability to make significant improvements in function in a reasonable and predictable amount of time.     Precautions / Restrictions Precautions Precautions: Fall Recall of Precautions/Restrictions: Impaired Restrictions Weight Bearing Restrictions Per Provider Order: Yes RLE Weight Bearing Per Provider Order: Non weight bearing Other Position/Activity Restrictions: surgical dressing in place R residual limb      Mobility  Bed Mobility Overal bed mobility: Needs Assistance Bed Mobility: Supine to Sit, Sit to Supine     Supine to sit: Mod  assist, HOB elevated, Used rails Sit to supine: Mod assist, HOB elevated, Used rails   General bed mobility comments: Pt very confused/disgruntled. Unwilling to sustain sitting position EOB, immediately returning to supine.    Transfers                   General transfer comment: unable to progress beyond EOB due to confusion    Ambulation/Gait                  Stairs            Wheelchair Mobility     Tilt Bed    Modified Rankin (Stroke Patients Only)       Balance Overall balance assessment: Needs assistance Sitting-balance support: Feet unsupported, Bilateral upper extremity supported Sitting balance-Leahy Scale: Poor                                       Pertinent Vitals/Pain Pain Assessment Pain Assessment: Faces Pain Score: 0-No pain    Home Living Family/patient expects to be discharged to:: Skilled nursing facility                   Additional Comments: [facility] since Jan 2025 hospitalization    Prior Function Prior Level of Function : Needs assist;History of Falls (last six months)             Mobility Comments: Per chart, amb with RW at SNF. Pt does have w/c in room.  Extremity/Trunk Assessment   Upper Extremity Assessment Upper Extremity Assessment: Defer to OT evaluation    Lower Extremity Assessment Lower Extremity Assessment: Generalized weakness;RLE deficits/detail RLE Deficits / Details: s/p AKA       Communication   Communication Communication: No apparent difficulties    Cognition Arousal: Alert Behavior During Therapy: Agitated, Restless   PT - Cognitive impairments: No family/caregiver present to determine baseline, Orientation, Awareness, Memory, Attention, Safety/Judgement, Problem solving, Initiation, Sequencing   Orientation impairments: Place, Time, Situation                   PT - Cognition Comments: Pt very confused. Upon entry, pt had  removed her gown and pulled out her IV. Very disgruntled. Unable to re-orient. Following commands: Impaired Following commands impaired: Follows one step commands inconsistently, Follows one step commands with increased time     Cueing Cueing Techniques: Verbal cues, Gestural cues, Tactile cues     General Comments      Exercises     Assessment/Plan    PT Assessment Patient needs continued PT services  PT Problem List Decreased strength;Decreased balance;Decreased cognition;Decreased knowledge of precautions;Decreased mobility;Decreased activity tolerance;Decreased safety awareness       PT Treatment Interventions DME instruction;Therapeutic activities;Cognitive remediation;Therapeutic exercise;Patient/family education;Balance training;Functional mobility training    PT Goals (Current goals can be found in the Care Plan section)  Acute Rehab PT Goals Patient Stated Goal: not stated PT Goal Formulation: Patient unable to participate in goal setting Time For Goal Achievement: 10/30/23 Potential to Achieve Goals: Fair    Frequency Min 2X/week     Co-evaluation               AM-PAC PT "6 Clicks" Mobility  Outcome Measure Help needed turning from your back to your side while in a flat bed without using bedrails?: A Little Help needed moving from lying on your back to sitting on the side of a flat bed without using bedrails?: A Lot Help needed moving to and from a bed to a chair (including a wheelchair)?: A Lot Help needed standing up from a chair using your arms (e.g., wheelchair or bedside chair)?: Total Help needed to walk in hospital room?: Total Help needed climbing 3-5 steps with a railing? : Total 6 Click Score: 10    End of Session   Activity Tolerance: Treatment limited secondary to agitation Patient left: in bed;with bed alarm set;with call bell/phone within reach Nurse Communication: Mobility status;Other (comment) (Pt pulled out IV.) PT Visit Diagnosis:  Other abnormalities of gait and mobility (R26.89);Muscle weakness (generalized) (M62.81)    Time: 1001-1013 PT Time Calculation (min) (ACUTE ONLY): 12 min   Charges:   PT Evaluation $PT Eval Moderate Complexity: 1 Mod   PT General Charges $$ ACUTE PT VISIT: 1 Visit         Dorothye Gathers., PT  Office # [phone]   [name] Leech 10/16/2023, 12:15 PM

## 2023-10-16 NOTE — Evaluation (Signed)
 Occupational Therapy Evaluation Patient Details Name: Holly Guerra MRN: [id number] DOB: 04-10-1941 Today's Date: 10/16/2023   History of Present Illness   Pt is an 83 y.o. female admitted 10/15/23 with R foot gangrene and underwent R AKA. PMH: HTN, PAD, CHF, PAF, dementia, fall Jan 2025 with R hip fx s/p THA     Clinical Impressions Pt presents with decline in function and safety with ADLs and ADL mobility with impaired strength, balance, endurance and cognition. PTA pt lived at [name] and pt reports that she was Ind with selfcare and toileting, unertain of accuracy of info as pt demos some confusion and that she used a RW at [facility]. Pt does currently have a w/c in room. Pt currently requires mod A with sit EOB, min A with UB ADLs bed level, total A with LB ADLs and toileting; pt declined any transfers/OOB activity. Pt set up with lunch tray at end of session and HOB raised to safe angle. OT will follow acutely to maximize level of function and safety     If plan is discharge home, recommend the following:   A lot of help with bathing/dressing/bathroom;A lot of help with walking and/or transfers;Supervision due to cognitive status;Direct supervision/assist for financial management;Direct supervision/assist for medications management     Functional Status Assessment   Patient has had a recent decline in their functional status and demonstrates the ability to make significant improvements in function in a reasonable and predictable amount of time.     Equipment Recommendations   Other (comment) (defer)     Recommendations for Other Services         Precautions/Restrictions   Precautions Precautions: Fall Recall of Precautions/Restrictions: Impaired Restrictions Weight Bearing Restrictions Per Provider Order: Yes RLE Weight Bearing Per Provider Order: Non weight bearing Other Position/Activity Restrictions: surgical dressing in place R residual limb     Mobility Bed  Mobility Overal bed mobility: Needs Assistance Bed Mobility: Supine to Sit, Sit to Supine     Supine to sit: Mod assist, HOB elevated, Used rails Sit to supine: Mod assist, HOB elevated, Used rails   General bed mobility comments: Pt  confused. Sat EOB less than 1 minute to drink water the returning to supine, max A to scoot to [facility] with use of bed controls to assist    Transfers                          Balance Overall balance assessment: Needs assistance Sitting-balance support: Feet unsupported, Bilateral upper extremity supported Sitting balance-Leahy Scale: Poor                                     ADL either performed or assessed with clinical judgement   ADL Overall ADL's : Needs assistance/impaired Eating/Feeding: Set up;Sitting;Bed level Eating/Feeding Details (indicate cue type and reason): HOB raised to 60 degrees Grooming: Set up;Bed level   Upper Body Bathing: Minimal assistance;Bed level   Lower Body Bathing: Total assistance;Bed level   Upper Body Dressing : Minimal assistance;Bed level   Lower Body Dressing: Total assistance;Bed level       Toileting- Clothing Manipulation and Hygiene: Total assistance;Bed level         General ADL Comments: pt unwilling to sit EOB > 1 minute, declined transfers     Vision Patient Visual Report: No change from baseline  Perception         Praxis         Pertinent Vitals/Pain Pain Assessment Pain Assessment: Faces Pain Score: 0-No pain Faces Pain Scale: No hurt Pain Intervention(s): Monitored during session, Repositioned     Extremity/Trunk Assessment Upper Extremity Assessment Upper Extremity Assessment: Generalized weakness;Right hand dominant   Lower Extremity Assessment Lower Extremity Assessment: Defer to PT evaluation RLE Deficits / Details: s/p AKA       Communication Communication Communication: No apparent difficulties   Cognition Arousal: Alert    Cognition: No family/caregiver present to determine baseline             OT - Cognition Comments: pt demos some confusion                 Following commands: Impaired Following commands impaired: Follows one step commands inconsistently, Follows one step commands with increased time     Cueing  General Comments   Cueing Techniques: Verbal cues;Gestural cues;Tactile cues      Exercises     Shoulder Instructions      Home Living Family/patient expects to be discharged to:: Skilled nursing facility Living Arrangements: Other (Comment) (LTC facility)                               Additional Comments: [facility] since Jan 2025 hospitalization      Prior Functioning/Environment Prior Level of Function : Needs assist;History of Falls (last six months)             Mobility Comments: Per chart, amb with RW at SNF. Pt does have w/c in room. ADLs Comments: pt reports that she was Ind with selfcare and toileting, unertain of accuracy of info as pt demos some confusion    OT Problem List: Decreased strength;Decreased knowledge of precautions;Decreased activity tolerance;Decreased cognition;Impaired balance (sitting and/or standing);Decreased safety awareness   OT Treatment/Interventions: Self-care/ADL training;Therapeutic exercise;Patient/family education;Balance training;Therapeutic activities;DME and/or AE instruction      OT Goals(Current goals can be found in the care plan section)   Acute Rehab OT Goals Patient Stated Goal: none stated OT Goal Formulation: With patient Time For Goal Achievement: 10/30/23 ADL Goals Pt Will Perform Grooming: with min assist;with contact guard assist;sitting Pt Will Perform Upper Body Bathing: with min assist;with contact guard assist;sitting Pt Will Perform Upper Body Dressing: with min assist;with contact guard assist;sitting Pt Will Transfer to Toilet: with max assist;with mod assist;stand  pivot transfer;squat pivot transfer;bedside commode   OT Frequency:  Min 2X/week    Co-evaluation              AM-PAC OT "6 Clicks" Daily Activity     Outcome Measure Help from another person eating meals?: A Little Help from another person taking care of personal grooming?: A Little Help from another person toileting, which includes using toliet, bedpan, or urinal?: Total Help from another person bathing (including washing, rinsing, drying)?: A Lot Help from another person to put on and taking off regular upper body clothing?: Total Help from another person to put on and taking off regular lower body clothing?: A Little 6 Click Score: 13   End of Session    Activity Tolerance: Treatment limited secondary to agitation Patient left: in bed;with call bell/phone within reach;with bed alarm set  OT Visit Diagnosis: Other abnormalities of gait and mobility (R26.89);Muscle weakness (generalized) (M62.81)  Time: 8119-1478 OT Time Calculation (min): 21 min Charges:  OT General Charges $OT Visit: 1 Visit OT Evaluation $OT Eval Moderate Complexity: 1 Mod    Alfred Ann 10/16/2023, 1:39 PM

## 2023-10-17 DIAGNOSIS — Z89511 Acquired absence of right leg below knee: Secondary | ICD-10-CM | POA: Diagnosis not present

## 2023-10-17 LAB — CBC
HCT: 24.3 % — ABNORMAL LOW (ref 36.0–46.0)
Hemoglobin: 7.7 g/dL — ABNORMAL LOW (ref 12.0–15.0)
MCH: 22.6 pg — ABNORMAL LOW (ref 26.0–34.0)
MCHC: 31.7 g/dL (ref 30.0–36.0)
MCV: 71.3 fL — ABNORMAL LOW (ref 80.0–100.0)
Platelets: 545 10*3/uL — ABNORMAL HIGH (ref 150–400)
RBC: 3.41 MIL/uL — ABNORMAL LOW (ref 3.87–5.11)
RDW: 17.8 % — ABNORMAL HIGH (ref 11.5–15.5)
WBC: 9.4 10*3/uL (ref 4.0–10.5)
nRBC: 0 % (ref 0.0–0.2)

## 2023-10-17 LAB — BASIC METABOLIC PANEL WITH GFR
Anion gap: 6 (ref 5–15)
BUN: 26 mg/dL — ABNORMAL HIGH (ref 8–23)
CO2: 25 mmol/L (ref 22–32)
Calcium: 8.1 mg/dL — ABNORMAL LOW (ref 8.9–10.3)
Chloride: 105 mmol/L (ref 98–111)
Creatinine, Ser: 0.92 mg/dL (ref 0.44–1.00)
GFR, Estimated: >60 mL/min (ref >60)
Glucose, Bld: 109 mg/dL — ABNORMAL HIGH (ref 70–99)
Potassium: 4 mmol/L (ref 3.5–5.1)
Sodium: 136 mmol/L (ref 135–145)

## 2023-10-17 NOTE — Progress Notes (Signed)
  Progress Note    10/17/2023 7:02 AM 2 Days Post-Op  Subjective:  says her leg feels better; c/o left shoulder nerve pain.  Afebrile 100% RA  Vitals:   10/16/23 2217 10/17/23 0426  BP: 119/67 126/76  Pulse: 93 97  Resp:  18  Temp:  98.2 F (36.8 C)  SpO2:  100%    Physical Exam: Incisions:  pt with food tray in bed with her eating-unable to see bandage.    CBC    Component Value Date/Time   WBC 9.4 10/17/2023 0304   RBC 3.41 (L) 10/17/2023 0304   HGB 7.7 (L) 10/17/2023 0304   HGB 10.7 (L) 05/21/2020 0930   HCT 24.3 (L) 10/17/2023 0304   HCT 37.0 05/21/2020 0930   PLT 545 (H) 10/17/2023 0304   PLT 367 05/21/2020 0930   MCV 71.3 (L) 10/17/2023 0304   MCV 72 (L) 05/21/2020 0930   MCH 22.6 (L) 10/17/2023 0304   MCHC 31.7 10/17/2023 0304   RDW 17.8 (H) 10/17/2023 0304   RDW 21.0 (H) 05/21/2020 0930   LYMPHSABS 0.8 05/23/2023 0015   MONOABS 0.9 05/23/2023 0015   EOSABS 0.1 05/23/2023 0015   BASOSABS 0.0 05/23/2023 0015    BMET    Component Value Date/Time   NA 136 10/17/2023 0304   NA 137 05/21/2020 0930   K 4.0 10/17/2023 0304   CL 105 10/17/2023 0304   CO2 25 10/17/2023 0304   GLUCOSE 109 (H) 10/17/2023 0304   BUN 26 (H) 10/17/2023 0304   BUN 8 05/21/2020 0930   CREATININE 0.92 10/17/2023 0304   CALCIUM  8.1 (L) 10/17/2023 0304   GFRNONAA >60 10/17/2023 0304   GFRAA 74 05/21/2020 0930    INR    Component Value Date/Time   INR 1.2 10/15/2023 0605     Intake/Output Summary (Last 24 hours) at 10/17/2023 1478 Last data filed at 10/16/2023 0726 Gross per 24 hour  Intake 200 ml  Output --  Net 200 ml     Assessment/Plan:  83 y.o. female is s/p right above knee amputation   2 Days Post-Op   -pt's pain improved today -plan to remove dressing tomorrow.  -OT/PT recommending short term SNF rehab   Maryanna Smart, [facility] [phone] 10/17/2023 7:02 AM

## 2023-10-17 NOTE — NC FL2 (Signed)
 [place]  MEDICAID FL2 LEVEL OF CARE FORM     IDENTIFICATION  Patient Name: Holly Guerra [facility] Birthdate: 16-May-1940 Sex: female Admission Date (Current Location): 10/15/2023  [facility] and [place] Number:  Producer, television/film/video and Address:  [facility], [address] [place], [place] [address]      Provider Number: [id number]  Attending Physician Name and Address:  Shera Diego*  Relative Name and Phone Number:  Ihrig,Kimberly  Daughter [phone]    Current Level of Care: Hospital Recommended Level of Care: Skilled Nursing Facility Prior Approval Number:    Date Approved/Denied: 10/17/23 PASRR Number: [id number]  Discharge Plan: SNF    Current Diagnoses: Patient Active Problem List   Diagnosis Date Noted   S/P below knee amputation, right (HCC) 10/15/2023   Leukocytosis 10/15/2023   Normocytic anemia 10/15/2023   Dementia (HCC) 10/15/2023   Closed right hip fracture, initial encounter (HCC) 05/23/2023   PAF (paroxysmal atrial fibrillation) (HCC) 05/23/2023   Chronic heart failure with mildly reduced ejection fraction (HFmrEF, 41-49%) (HCC) 05/23/2023   Esophageal stricture 05/17/2023   Loss of weight 05/11/2023   Protein-calorie malnutrition, severe 05/10/2023   Dysphagia 05/10/2023   Abnormal barium swallow 05/10/2023   Chronic anticoagulation 05/10/2023   Microcytic anemia 05/04/2023   GERD (gastroesophageal reflux disease) 05/04/2023   Pernicious anemia 05/04/2023   Hypertensive crisis 05/04/2023   Acute heart failure (HCC) 05/03/2023   Peripheral artery disease (HCC) 07/23/2012   Essential hypertension 07/23/2012   HLD (hyperlipidemia) 07/23/2012   Tobacco abuse 07/23/2012    Orientation RESPIRATION BLADDER Height & Weight     Self, Place  O2   Weight: 90 lb (40.8 kg) Height:  5\' 2"  (157.5 cm)  BEHAVIORAL SYMPTOMS/MOOD NEUROLOGICAL BOWEL NUTRITION STATUS      Continent Diet  AMBULATORY STATUS COMMUNICATION OF NEEDS  Skin   Limited Assist   Normal                       Personal Care Assistance Level of Assistance              Functional Limitations Info  Sight Sight Info: Impaired        SPECIAL CARE FACTORS FREQUENCY  PT (By licensed PT), OT (By licensed OT)     PT Frequency: 5x OT Frequency: 3x            Contractures Contractures Info: Not present    Additional Factors Info  Code Status Code Status Info: Full             Current Medications (10/17/2023):  This is the current hospital active medication list Current Facility-Administered Medications  Medication Dose Route Frequency Provider Last Rate Last Admin   acetaminophen  (TYLENOL ) tablet 650 mg  650 mg Oral Q6H PRN Rhyne, Samantha J, PA-C       Or   acetaminophen  (TYLENOL ) suppository 650 mg  650 mg Rectal Q6H PRN Rhyne, Samantha J, PA-C       albuterol  (PROVENTIL ) (2.5 MG/3ML) 0.083% nebulizer solution 2.5 mg  2.5 mg Nebulization Q4H PRN Rhyne, Samantha J, PA-C       allopurinol  (ZYLOPRIM ) tablet 100 mg  100 mg Oral QPM Rhyne, Samantha J, PA-C   100 mg at 10/16/23 8469   amLODipine  (NORVASC ) tablet 5 mg  5 mg Oral Daily Smith, Rondell A, MD       calcium  carbonate (OS-CAL - dosed in mg of elemental calcium ) tablet 1,250 mg  1  tablet Oral Q breakfast Rhyne, Samantha J, PA-C   1,250 mg at 10/17/23 1020   clopidogrel  (PLAVIX ) tablet 75 mg  75 mg Oral Daily Rhyne, Samantha J, PA-C   75 mg at 10/17/23 1021   cyclobenzaprine  (FLEXERIL ) tablet 10 mg  10 mg Oral QHS Rhyne, Samantha J, PA-C   10 mg at 10/16/23 2219   docusate sodium  (COLACE) capsule 100 mg  100 mg Oral Daily Rhyne, Samantha J, PA-C   100 mg at 10/17/23 1020   ferrous sulfate  tablet 325 mg  325 mg Oral Q breakfast Rhyne, Samantha J, PA-C   325 mg at 10/17/23 1020   haloperidol  lactate (HALDOL ) injection 1 mg  1 mg Intravenous Q6H PRN Macdonald Savoy, MD       heparin  injection 5,000 Units  5,000 Units Subcutaneous Q8H Rhyne, Samantha J, PA-C   5,000  Units at 10/17/23 7829   HYDROmorphone  (DILAUDID ) injection 0.5 mg  0.5 mg Intravenous Q3H PRN Rhyne, Samantha J, PA-C       hydrOXYzine  (ATARAX ) tablet 25 mg  25 mg Oral QHS PRN Rhyne, Samantha J, PA-C       magnesium  sulfate IVPB 2 g 50 mL  2 g Intravenous Daily PRN Rhyne, Samantha J, PA-C       melatonin tablet 3 mg  3 mg Oral QHS Macdonald Savoy, MD   3 mg at 10/16/23 2309   metoprolol  tartrate (LOPRESSOR ) tablet 12.5 mg  12.5 mg Oral BID Smith, Rondell A, MD   12.5 mg at 10/16/23 2219   nicotine  (NICODERM CQ  - dosed in mg/24 hours) patch 21 mg  21 mg Transdermal Daily Rhyne, Samantha J, PA-C   21 mg at 10/16/23 1039   oxyCODONE -acetaminophen  (PERCOCET/ROXICET) 5-325 MG per tablet 1 tablet  1 tablet Oral Q4H PRN Rhyne, Samantha J, PA-C   1 tablet at 10/16/23 1837   pantoprazole  (PROTONIX ) EC tablet 40 mg  40 mg Oral Daily Rhyne, Samantha J, PA-C   40 mg at 10/17/23 1020   phenol (CHLORASEPTIC) mouth spray 1 spray  1 spray Mouth/Throat PRN Rhyne, Samantha J, PA-C       polyethylene glycol (MIRALAX / GLYCOLAX) packet 17 g  17 g Oral BID Macdonald Savoy, MD   17 g at 10/17/23 1023   potassium chloride  SA (KLOR-CON  M) CR tablet 20-40 mEq  20-40 mEq Oral Daily PRN Rhyne, Samantha J, PA-C       sodium chloride  flush (NS) 0.9 % injection 10-40 mL  10-40 mL Intracatheter Q12H Adine Hoof, MD   10 mL at 10/17/23 1023   sodium chloride  flush (NS) 0.9 % injection 10-40 mL  10-40 mL Intracatheter PRN Adine Hoof, MD       torsemide  (DEMADEX ) tablet 10 mg  10 mg Oral Daily Rhyne, Samantha J, PA-C   10 mg at 10/17/23 1020     Discharge Medications: Please see discharge summary for a list of discharge medications.  Relevant Imaging Results:  Relevant Lab Results:   Additional Information SSN [id number] [place], [place]

## 2023-10-17 NOTE — Progress Notes (Signed)
 [facility] CONSULT PROGRESS NOTE    Progress Note  Tanvi Gatling [facility]  ZOX:[id number] DOB: July 14, 1940 DOA: 10/15/2023 PCP: Lajuanda Pile, PA-C     Brief Narrative:   Holly Guerra is an 83 y.o. female past medical history significant for essential hypertension, chronic atrial fibrillation on Eliquis , peripheral vascular disease, diagnosed with a right lower extremity gangrene patient was taken for above-the-knee right amputation and admission by Dr. Vikki Graves..  We have been consulted for medical management.  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 Further management per vascular surgery. Antibiotics per vascular surgery DVT prophylaxis and weightbearing per vascular surgery. PT evaluated the patient recommended skilled nursing facility  Leukocytosis Likely due to gangrene leukocytosis improving.  Essential hypertension Continue amlodipine  and metoprolol . Blood pressures relatively stable.  Microcytic anemia: Hemoglobin is stable at 8.5. MCV is low RDW is high will need further workup as an outpatient. Patient does not remember what was the last colonoscopy. Will need further evaluation as an outpatient.  Thrombocytosis: Likely reactive now improving.  PAF (paroxysmal atrial fibrillation) (HCC) He is to be rate controlled in sinus rhythm with metoprolol . Vascular surgery to dictate when to start Eliquis .  Chronic heart failure with mildly reduced ejection fraction (HFmrEF, 41-49%) (HCC) Renal function is slightly elevated this morning. Torsemide  has been resumed.  Peripheral artery disease (HCC) Continue Plavix .  Dementia (HCC) Continue home medications he is at risk of acute confusional state. Also had high risk of aspiration.  DVT prophylaxis: Lovenox Family Communication:none Status is: Inpatient Remains inpatient appropriate because: Right foot gangrene status post  amputation    Code Status:     Code Status Orders  (From admission, onward)           Start     Ordered   10/15/23 1047  Full code  Continuous       Question:  By:  Answer:  Consent: discussion documented in EHR   10/15/23 1050           Code Status History     Date Active Date Inactive Code Status Order ID Comments User Context   05/23/2023 0208 05/28/2023 0224 Limited: Do not attempt resuscitation (DNR) -DNR-LIMITED -Do Not Intubate/DNI  [id number]  Walton Guppy, MD ED   05/16/2023 1123 05/19/2023 2208 Limited: Do not attempt resuscitation (DNR) -DNR-LIMITED -Do Not Intubate/DNI  [id number]  Shellye Dibble, NP Inpatient   05/04/2023 0004 05/16/2023 1123 Full Code [id number]  Jetty Mort, MD Inpatient   05/23/2020 1104 05/23/2020 1939 Full Code [id number]  Avanell Leigh, MD Inpatient         IV Access:   Peripheral IV   Procedures and diagnostic studies:   No results found.   Medical Consultants:   None.   Subjective:    Secret Kristensen Sweeny no complaints.  Objective:    Vitals:   10/16/23 2017 10/16/23 2017 10/16/23 2217 10/17/23 0426  BP: 119/68 119/68 119/67 126/76  Pulse: 100  93 97  Resp: 18 18  18   Temp: 98.3 F (36.8 C) 98.3 F (36.8 C)  98.2 F (36.8 C)  TempSrc: Oral     SpO2: 100% 100%  100%  Weight:      Height:       SpO2: 100 %  No intake or output data in the 24 hours ending 10/17/23 0806  Filed Weights   10/15/23 0553  Weight: 40.8 kg    Exam: General exam:  In no acute distress. Respiratory system: Good air movement and clear to auscultation. Cardiovascular system: S1 & S2 heard, RRR. No JVD. Gastrointestinal system: Abdomen is nondistended, soft and nontender.  Extremities: No pedal edema. Skin: No rashes, lesions or ulcers Psychiatry: Judgement and insight appear normal. Mood & affect appropriate. Data Reviewed:    Labs: Basic Metabolic Panel: Recent Labs  Lab 10/15/23 0605 10/16/23 0459 10/17/23 0304   NA 138 133* 136  K 4.3 4.7 4.0  CL 102 100 105  CO2 26 25 25   GLUCOSE 127* 98 109*  BUN 33* 31* 26*  CREATININE 0.87  0.95 1.08* 0.92  CALCIUM  9.6 7.9* 8.1*   GFR Estimated Creatinine Clearance: 30.9 mL/min (by C-G formula based on SCr of 0.92 mg/dL). Liver Function Tests: Recent Labs  Lab 10/15/23 0605  AST 20  ALT 13  ALKPHOS 78  BILITOT 0.6  PROT 8.0  ALBUMIN 2.7*   No results for input(s): "LIPASE", "AMYLASE" in the last 168 hours. No results for input(s): "AMMONIA" in the last 168 hours. Coagulation profile Recent Labs  Lab 10/15/23 0605  INR 1.2   COVID-19 Labs  No results for input(s): "DDIMER", "FERRITIN", "LDH", "CRP" in the last 72 hours.  Lab Results  Component Value Date   SARSCOV2NAA NEGATIVE 05/03/2023   SARSCOV2NAA NEGATIVE 05/21/2020    CBC: Recent Labs  Lab 10/15/23 0605 10/16/23 0459 10/17/23 0304  WBC 14.2* 12.3* 9.4  HGB 9.5* 8.4* 7.7*  HCT 30.5* 26.4* 24.3*  MCV 72.6* 72.1* 71.3*  PLT 636* 555* 545*   Cardiac Enzymes: No results for input(s): "CKTOTAL", "CKMB", "CKMBINDEX", "TROPONINI" in the last 168 hours. BNP (last 3 results) No results for input(s): "PROBNP" in the last 8760 hours. CBG: No results for input(s): "GLUCAP" in the last 168 hours. D-Dimer: No results for input(s): "DDIMER" in the last 72 hours. Hgb A1c: No results for input(s): "HGBA1C" in the last 72 hours. Lipid Profile: No results for input(s): "CHOL", "HDL", "LDLCALC", "TRIG", "CHOLHDL", "LDLDIRECT" in the last 72 hours. Thyroid  function studies: No results for input(s): "TSH", "T4TOTAL", "T3FREE", "THYROIDAB" in the last 72 hours.  Invalid input(s): "FREET3" Anemia work up: No results for input(s): "VITAMINB12", "FOLATE", "FERRITIN", "TIBC", "IRON", "RETICCTPCT" in the last 72 hours. Sepsis Labs: Recent Labs  Lab 10/15/23 0605 10/16/23 0459 10/17/23 0304  WBC 14.2* 12.3* 9.4   Microbiology No results found for this or any previous visit (from  the past 240 hours).   Medications:    allopurinol   100 mg Oral QPM   amLODipine   5 mg Oral Daily   calcium  carbonate  1 tablet Oral Q breakfast   clopidogrel   75 mg Oral Daily   cyclobenzaprine   10 mg Oral QHS   docusate sodium   100 mg Oral Daily   ferrous sulfate   325 mg Oral Q breakfast   heparin   5,000 Units Subcutaneous Q8H   melatonin  3 mg Oral QHS   metoprolol  tartrate  12.5 mg Oral BID   nicotine   21 mg Transdermal Daily   pantoprazole   40 mg Oral Daily   polyethylene glycol  17 g Oral BID   sodium chloride  flush  10-40 mL Intracatheter Q12H   torsemide   10 mg Oral Daily   Continuous Infusions:  magnesium  sulfate bolus IVPB        LOS: 2 days   Macdonald Savoy  [facility]  10/17/2023, 8:06 AM

## 2023-10-17 NOTE — TOC Initial Note (Signed)
 Transition of Care [facility]) - Initial/Assessment Note    Patient Details  Name: Holly Guerra MRN: [id number] Date of Birth: 05/05/1941  Transition of Care [facility]) CM/SW Contact:    Hilda Lovings, LCSW Phone Number: 10/17/2023, 12:17 PM  Clinical Narrative:                 CSW followed-up disposition recommendations (SNF placement).  CSW completed initial TOC work-up/assessment as noted by the following below.   CSW spoke with the patient to review SNF referral process per clinical recommendations and assessed the pt's SNF preference.   The patient expressed no preference.   The CSW attempted to contact the patient's natural support (Samonte,Kimberly  Daughter [phone] ) to update them to the patient's disposition. CSW was unable to make contact with the natural support. CSW   CSW referral efforts to support the patient's disposition FL2:  PASRR:  SNF referrals:   TOC Disposition follow-up needs  Please provide the patient or natural support with bed-offer updates.  Please continue with SNF placement efforts. Please update the clinical team to SNF placement efforts:  No other needs identified by this Clinical research associate currently. Patient needs and current disposition to be followed by    Expected Discharge Plan: Skilled Nursing Facility Barriers to Discharge: Continued Medical Work up   Patient Goals and CMS Choice Patient states their goals for this hospitalization and ongoing recovery are:: To get better   Choice offered to / list presented to : Patient, Adult Children      Expected Discharge Plan and Services     Post Acute Care Choice: Skilled Nursing Facility Living arrangements for the past 2 months: Single Family Home                    Prior Living Arrangements/Services Living arrangements for the past 2 months: Single Family Home Lives with:: Self, Adult Children Patient language and need for interpreter reviewed:: Yes Do you feel safe going back to the place where  you live?: Yes      Need for Family Participation in Patient Care: No (Comment) Care giver support system in place?: Yes (comment)   Criminal Activity/Legal Involvement Pertinent to Current Situation/Hospitalization: No - Comment as needed  Activities of Daily Living   ADL Screening (condition at time of admission) Independently performs ADLs?: No Is the patient deaf or have difficulty hearing?: No Does the patient have difficulty seeing, even when wearing glasses/contacts?: No Does the patient have difficulty concentrating, remembering, or making decisions?: Yes  Permission Sought/Granted Permission sought to share information with : Family Supports, Magazine features editor, Case Manager Permission granted to share information with : Yes, Verbal Permission Granted  Share Information with NAME: Kiehn,Kimberly     Permission granted to share info w Relationship: Molden,Kimberly  Daughter [phone]  Permission granted to share info w Contact Information: Yes  Emotional Assessment       Orientation: : Oriented to Self, Oriented to Place Alcohol / Substance Use: Not Applicable Psych Involvement: No (comment)  Admission diagnosis:  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) [I70.261] S/P below knee amputation, right (HCC) [Z89.511] Patient Active Problem List   Diagnosis Date Noted   S/P below knee amputation, right (HCC) 10/15/2023   Leukocytosis 10/15/2023   Normocytic anemia 10/15/2023   Dementia (HCC) 10/15/2023   Closed right hip fracture, initial encounter (HCC) 05/23/2023   PAF (paroxysmal atrial fibrillation) (HCC) 05/23/2023   Chronic heart failure with mildly reduced  ejection fraction (HFmrEF, 41-49%) (HCC) 05/23/2023   Esophageal stricture 05/17/2023   Loss of weight 05/11/2023   Protein-calorie malnutrition, severe 05/10/2023   Dysphagia 05/10/2023   Abnormal barium swallow 05/10/2023   Chronic anticoagulation 05/10/2023    Microcytic anemia 05/04/2023   GERD (gastroesophageal reflux disease) 05/04/2023   Pernicious anemia 05/04/2023   Hypertensive crisis 05/04/2023   Acute heart failure (HCC) 05/03/2023   Peripheral artery disease (HCC) 07/23/2012   Essential hypertension 07/23/2012   HLD (hyperlipidemia) 07/23/2012   Tobacco abuse 07/23/2012   PCP:  Lajuanda Pile, PA-C Pharmacy:   [facility] [phone] - [place], [place] - [address] [place] [address] Phone: [phone] Fax: [phone]     Social Drivers of Health (SDOH) Social History: SDOH Screenings   Food Insecurity: Patient Unable To Answer (10/17/2023)  Housing: Patient Unable To Answer (10/17/2023)  Transportation Needs: No Transportation Needs (10/17/2023)  Utilities: Not At Risk (10/17/2023)  Alcohol Screen: Low Risk  (05/06/2023)  Financial Resource Strain: Low Risk  (05/06/2023)  Social Connections: Patient Unable To Answer (10/17/2023)  Tobacco Use: Medium Risk (10/15/2023)   SDOH Interventions:     Readmission Risk Interventions     No data to display

## 2023-10-18 DIAGNOSIS — Z89511 Acquired absence of right leg below knee: Secondary | ICD-10-CM | POA: Diagnosis not present

## 2023-10-18 DIAGNOSIS — I5022 Chronic systolic (congestive) heart failure: Secondary | ICD-10-CM | POA: Diagnosis not present

## 2023-10-18 DIAGNOSIS — D72829 Elevated white blood cell count, unspecified: Secondary | ICD-10-CM | POA: Diagnosis not present

## 2023-10-18 DIAGNOSIS — I480 Paroxysmal atrial fibrillation: Secondary | ICD-10-CM | POA: Diagnosis not present

## 2023-10-18 LAB — CBC
HCT: 31.1 % — ABNORMAL LOW (ref 36.0–46.0)
Hemoglobin: 9.6 g/dL — ABNORMAL LOW (ref 12.0–15.0)
MCH: 22.2 pg — ABNORMAL LOW (ref 26.0–34.0)
MCHC: 30.9 g/dL (ref 30.0–36.0)
MCV: 71.8 fL — ABNORMAL LOW (ref 80.0–100.0)
Platelets: 649 10*3/uL — ABNORMAL HIGH (ref 150–400)
RBC: 4.33 MIL/uL (ref 3.87–5.11)
RDW: 17.8 % — ABNORMAL HIGH (ref 11.5–15.5)
WBC: 8.1 10*3/uL (ref 4.0–10.5)
nRBC: 0 % (ref 0.0–0.2)

## 2023-10-18 LAB — TYPE AND SCREEN
ABO/RH(D): B POS
Antibody Screen: NEGATIVE

## 2023-10-18 LAB — FOLATE: Folate: 11.7 ng/mL (ref >5.9)

## 2023-10-18 LAB — RETICULOCYTES
Immature Retic Fract: 23.2 % — ABNORMAL HIGH (ref 2.3–15.9)
RBC.: 4.23 MIL/uL (ref 3.87–5.11)
Retic Count, Absolute: 86.3 10*3/uL (ref 19.0–186.0)
Retic Ct Pct: 2 % (ref 0.4–3.1)

## 2023-10-18 LAB — IRON AND TIBC
Iron: 48 ug/dL (ref 28–170)
Saturation Ratios: 12 % (ref 10.4–31.8)
TIBC: 400 ug/dL (ref 250–450)
UIBC: 352 ug/dL

## 2023-10-18 LAB — FERRITIN: Ferritin: 26 ng/mL (ref 11–307)

## 2023-10-18 LAB — PREPARE RBC (CROSSMATCH)

## 2023-10-18 LAB — VITAMIN B12: Vitamin B-12: 525 pg/mL (ref 180–914)

## 2023-10-18 LAB — SURGICAL PATHOLOGY

## 2023-10-18 MED ORDER — SODIUM CHLORIDE 0.9% IV SOLUTION: Freq: Once | INTRAVENOUS | Status: AC

## 2023-10-18 NOTE — Progress Notes (Addendum)
  Progress Note    10/18/2023 8:53 AM 3 Days Post-Op  Subjective:  she feels okay. She says that her right AKA is tender    Vitals:   10/18/23 0415 10/18/23 0718  BP: 109/68 120/70  Pulse: (!) 103 99  Resp: 18   Temp: 99.2 F (37.3 C) 98.3 F (36.8 C)  SpO2: 100% 100%    Physical Exam: General:  laying in bed, NAD Lungs:  nonlabored Incisions:  right AKA incision intact and dry with small amount of dried blood on dressing    CBC    Component Value Date/Time   WBC 9.4 10/17/2023 0304   RBC 3.41 (L) 10/17/2023 0304   HGB 7.7 (L) 10/17/2023 0304   HGB 10.7 (L) 05/21/2020 0930   HCT 24.3 (L) 10/17/2023 0304   HCT 37.0 05/21/2020 0930   PLT 545 (H) 10/17/2023 0304   PLT 367 05/21/2020 0930   MCV 71.3 (L) 10/17/2023 0304   MCV 72 (L) 05/21/2020 0930   MCH 22.6 (L) 10/17/2023 0304   MCHC 31.7 10/17/2023 0304   RDW 17.8 (H) 10/17/2023 0304   RDW 21.0 (H) 05/21/2020 0930   LYMPHSABS 0.8 05/23/2023 0015   MONOABS 0.9 05/23/2023 0015   EOSABS 0.1 05/23/2023 0015   BASOSABS 0.0 05/23/2023 0015    BMET    Component Value Date/Time   NA 136 10/17/2023 0304   NA 137 05/21/2020 0930   K 4.0 10/17/2023 0304   CL 105 10/17/2023 0304   CO2 25 10/17/2023 0304   GLUCOSE 109 (H) 10/17/2023 0304   BUN 26 (H) 10/17/2023 0304   BUN 8 05/21/2020 0930   CREATININE 0.92 10/17/2023 0304   CALCIUM  8.1 (L) 10/17/2023 0304   GFRNONAA >60 10/17/2023 0304   GFRAA 74 05/21/2020 0930    INR    Component Value Date/Time   INR 1.2 10/15/2023 0605    No intake or output data in the 24 hours ending 10/18/23 0853    Assessment/Plan:  83 y.o. female is 3 days post op, s/p: right AKA   -She is doing okay this morning but reports continued tenderness to the right AKA -I have taken the dressing down and her right AKA looks healthy and all staples are in place. She did have a small amount of dried blood on her dressing -No signs of infection or hematoma of R AKA stump -Will  likely restart home Eliquis  today if CBC is stable this morning -Anticipate discharge later this week to SNF   Deneise Finlay, PA-C [facility] [phone] 10/18/2023 8:53 AM  I have independently interviewed and examined patient and agree with PA assessment and plan above.   Cela Newcom C. Vikki Graves, MD [facility] [place] Office: [phone] Pager: [phone]

## 2023-10-18 NOTE — TOC Progression Note (Signed)
 Transition of Care [facility]) - Progression Note    Patient Details  Name: [name] MRN: [id number] Date of Birth: [birth year]  [facility]) CM/SW Contact  Elspeth Hals, LCSW Phone Number: 10/18/2023, 10:50 AM  Clinical Narrative:   CSW spoke with pt daughter Jullie Oiler.  Pt was admitted from [facility], has been there several months, Jullie Oiler does want her to return at DC.  CSW confirmed with [facility] that pt can return.      Expected Discharge Plan: Skilled Nursing Facility Barriers to Discharge: Continued Medical Work up  Expected Discharge Plan and Services     Post Acute Care Choice: Skilled Nursing Facility Living arrangements for the past 2 months: Single Family Home                                       Social Determinants of Health (SDOH) Interventions SDOH Screenings   Food Insecurity: Patient Unable To Answer (10/17/2023)  Housing: Patient Unable To Answer (10/17/2023)  Transportation Needs: No Transportation Needs (10/17/2023)  Utilities: Not At Risk (10/17/2023)  Alcohol Screen: Low Risk  (05/06/2023)  Financial Resource Strain: Low Risk  (05/06/2023)  Social Connections: Patient Unable To Answer (10/17/2023)  Tobacco Use: Medium Risk (10/15/2023)    Readmission Risk Interventions     No data to display

## 2023-10-18 NOTE — Care Management Important Message (Signed)
 Important Message  Patient Details  Name: Holly Guerra MRN: [id number] Date of Birth: Apr 29, 1941   Important Message Given:  Yes - Medicare IM     Felix Host 10/18/2023, 11:28 AM

## 2023-10-18 NOTE — Progress Notes (Signed)
 [facility] CONSULT PROGRESS NOTE    Progress Note  Holly Guerra [facility]  WJX:[id number] DOB: [birth year] DOA: 10/15/2023 PCP: Lajuanda Pile, PA-C     Brief Narrative:   Holly Guerra is an 83 y.o. female past medical history significant for essential hypertension, chronic atrial fibrillation on Eliquis , peripheral vascular disease, diagnosed with a right lower extremity gangrene patient was taken for above-the-knee right amputation and admission by Dr. Vikki Graves..  We have been consulted for medical management.  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 Further management per vascular surgery. Antibiotics per vascular surgery DVT prophylaxis and weightbearing per vascular surgery. PT evaluated the patient recommended skilled nursing facility, awaiting skilled nursing facility placement.  New Epistaxis: I was in the room the patient had 2 large clots that came out. Her hemoglobin has is drifting down, she did not have hematemesis or hemoptysis. Will go ahead and recheck a CBC, type and screen, discussed with vascular surgery if it is okay to hold Plavix . Type and screen 2 units of packed red blood cells.   1 unit packed red blood cells check a CBC posttransfusion.  Leukocytosis Likely due to gangrene leukocytosis improving.  Essential hypertension Continue amlodipine  and metoprolol . Blood pressures relatively stable.  Microcytic anemia: MCV is low RDW is high. Patient does not remember what was the last colonoscopy. Mild drop in hemoglobin this morning to 7.7. See above for further details hold heparin . Check an anemia panel.  Thrombocytosis: Likely reactive now improving.  PAF (paroxysmal atrial fibrillation) (HCC) He is to be rate controlled in sinus rhythm with metoprolol . Continue to hold Eliquis . See above for the details. Check a CBC tomorrow morning.  Chronic heart failure with  mildly reduced ejection fraction (HFmrEF, 41-49%) (HCC) Continue torsemide  renal function is stabilized.  Peripheral artery disease (HCC) Continue Plavix  for now. Will discuss with vascular surgery to see if we could hold Plavix .  Dementia (HCC) Continue home medications he is at risk of acute confusional state. Also had high risk of aspiration.  DVT prophylaxis: Lovenox Family Communication:none Status is: Inpatient Remains inpatient appropriate because: Right foot gangrene status post amputation    Code Status:     Code Status Orders  (From admission, onward)           Start     Ordered   10/15/23 1047  Full code  Continuous       Question:  By:  Answer:  Consent: discussion documented in EHR   10/15/23 1050           Code Status History     Date Active Date Inactive Code Status Order ID Comments User Context   05/23/2023 0208 05/28/2023 0224 Limited: Do not attempt resuscitation (DNR) -DNR-LIMITED -Do Not Intubate/DNI  [id number]  Walton Guppy, MD ED   05/16/2023 1123 05/19/2023 2208 Limited: Do not attempt resuscitation (DNR) -DNR-LIMITED -Do Not Intubate/DNI  [id number]  Shellye Dibble, NP Inpatient   05/04/2023 0004 05/16/2023 1123 Full Code [id number]  Jetty Mort, MD Inpatient   05/23/2020 1104 05/23/2020 1939 Full Code [id number]  Avanell Leigh, MD Inpatient         IV Access:   Peripheral IV   Procedures and diagnostic studies:   No results found.   Medical Consultants:   None.   Subjective:    Holly Guerra no complaints.  Objective:    Vitals:   10/17/23 2045 10/17/23 2238 10/18/23 0415 10/18/23  0718  BP: 111/68 130/81 109/68 120/70  Pulse: (!) 116 (!) 108 (!) 103 99  Resp: 18 18 18    Temp: 98.6 F (37 C)  99.2 F (37.3 C) 98.3 F (36.8 C)  TempSrc: Oral  Oral   SpO2: 98%  100% 100%  Weight:      Height:       SpO2: 100 %  No intake or output data in the 24 hours ending 10/18/23 0925  Filed Weights   10/15/23  0553  Weight: 40.8 kg    Exam: General exam: In no acute distress. Respiratory system: Good air movement and clear to auscultation. Cardiovascular system: S1 & S2 heard, RRR. No JVD. Gastrointestinal system: Abdomen is nondistended, soft and nontender.  Extremities: No pedal edema. Skin: No rashes, lesions or ulcers Psychiatry: Judgement and insight appear normal. Mood & affect appropriate. Data Reviewed:    Labs: Basic Metabolic Panel: Recent Labs  Lab 10/15/23 0605 10/16/23 0459 10/17/23 0304  NA 138 133* 136  K 4.3 4.7 4.0  CL 102 100 105  CO2 26 25 25   GLUCOSE 127* 98 109*  BUN 33* 31* 26*  CREATININE 0.87  0.95 1.08* 0.92  CALCIUM  9.6 7.9* 8.1*   GFR Estimated Creatinine Clearance: 30.9 mL/min (by C-G formula based on SCr of 0.92 mg/dL). Liver Function Tests: Recent Labs  Lab 10/15/23 0605  AST 20  ALT 13  ALKPHOS 78  BILITOT 0.6  PROT 8.0  ALBUMIN 2.7*   No results for input(s): "LIPASE", "AMYLASE" in the last 168 hours. No results for input(s): "AMMONIA" in the last 168 hours. Coagulation profile Recent Labs  Lab 10/15/23 0605  INR 1.2   COVID-19 Labs  No results for input(s): "DDIMER", "FERRITIN", "LDH", "CRP" in the last 72 hours.  Lab Results  Component Value Date   SARSCOV2NAA NEGATIVE 05/03/2023   SARSCOV2NAA NEGATIVE 05/21/2020    CBC: Recent Labs  Lab 10/15/23 0605 10/16/23 0459 10/17/23 0304  WBC 14.2* 12.3* 9.4  HGB 9.5* 8.4* 7.7*  HCT 30.5* 26.4* 24.3*  MCV 72.6* 72.1* 71.3*  PLT 636* 555* 545*   Cardiac Enzymes: No results for input(s): "CKTOTAL", "CKMB", "CKMBINDEX", "TROPONINI" in the last 168 hours. BNP (last 3 results) No results for input(s): "PROBNP" in the last 8760 hours. CBG: No results for input(s): "GLUCAP" in the last 168 hours. D-Dimer: No results for input(s): "DDIMER" in the last 72 hours. Hgb A1c: No results for input(s): "HGBA1C" in the last 72 hours. Lipid Profile: No results for input(s):  "CHOL", "HDL", "LDLCALC", "TRIG", "CHOLHDL", "LDLDIRECT" in the last 72 hours. Thyroid  function studies: No results for input(s): "TSH", "T4TOTAL", "T3FREE", "THYROIDAB" in the last 72 hours.  Invalid input(s): "FREET3" Anemia work up: No results for input(s): "VITAMINB12", "FOLATE", "FERRITIN", "TIBC", "IRON", "RETICCTPCT" in the last 72 hours. Sepsis Labs: Recent Labs  Lab 10/15/23 0605 10/16/23 0459 10/17/23 0304  WBC 14.2* 12.3* 9.4   Microbiology No results found for this or any previous visit (from the past 240 hours).   Medications:    allopurinol   100 mg Oral QPM   amLODipine   5 mg Oral Daily   calcium  carbonate  1 tablet Oral Q breakfast   clopidogrel   75 mg Oral Daily   cyclobenzaprine   10 mg Oral QHS   docusate sodium   100 mg Oral Daily   ferrous sulfate   325 mg Oral Q breakfast   heparin   5,000 Units Subcutaneous Q8H   melatonin  3 mg Oral QHS   metoprolol   tartrate  12.5 mg Oral BID   nicotine   21 mg Transdermal Daily   pantoprazole   40 mg Oral Daily   sodium chloride  flush  10-40 mL Intracatheter Q12H   torsemide   10 mg Oral Daily   Continuous Infusions:  magnesium  sulfate bolus IVPB        LOS: 3 days   Macdonald Savoy  [facility]  10/18/2023, 9:25 AM

## 2023-10-19 DIAGNOSIS — I5022 Chronic systolic (congestive) heart failure: Secondary | ICD-10-CM | POA: Diagnosis not present

## 2023-10-19 DIAGNOSIS — D72829 Elevated white blood cell count, unspecified: Secondary | ICD-10-CM | POA: Diagnosis not present

## 2023-10-19 DIAGNOSIS — I480 Paroxysmal atrial fibrillation: Secondary | ICD-10-CM | POA: Diagnosis not present

## 2023-10-19 DIAGNOSIS — Z89511 Acquired absence of right leg below knee: Secondary | ICD-10-CM | POA: Diagnosis not present

## 2023-10-19 LAB — CBC
HCT: 27.6 % — ABNORMAL LOW (ref 36.0–46.0)
Hemoglobin: 8.8 g/dL — ABNORMAL LOW (ref 12.0–15.0)
MCH: 22.5 pg — ABNORMAL LOW (ref 26.0–34.0)
MCHC: 31.9 g/dL (ref 30.0–36.0)
MCV: 70.6 fL — ABNORMAL LOW (ref 80.0–100.0)
Platelets: 629 10*3/uL — ABNORMAL HIGH (ref 150–400)
RBC: 3.91 MIL/uL (ref 3.87–5.11)
RDW: 17.6 % — ABNORMAL HIGH (ref 11.5–15.5)
WBC: 8.1 10*3/uL (ref 4.0–10.5)
nRBC: 0 % (ref 0.0–0.2)

## 2023-10-19 NOTE — Progress Notes (Addendum)
 [facility] CONSULT PROGRESS NOTE    Progress Note  Holly Guerra [facility]  FAO:[id number] DOB: 18-Oct-1940 DOA: 10/15/2023 PCP: Lajuanda Pile, PA-C     Brief Narrative:   Holly Guerra is an 83 y.o. female past medical history significant for essential hypertension, chronic atrial fibrillation on Eliquis , peripheral vascular disease, diagnosed with a right lower extremity gangrene patient was taken for above-the-knee right amputation and admission by Dr. Vikki Graves..  We have been consulted for medical management.  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 Further management per vascular surgery. Antibiotics per vascular surgery DVT prophylaxis and weightbearing per vascular surgery. PT evaluated the patient recommended skilled nursing facility, awaiting skilled nursing facility placement. Eliquis  and Plavix  were held due to epistaxis, now resolved. Stable to be transferred to skilled from internal medicine standpoint.  New Epistaxis: I was in the room the patient had 2 large clots that came out. Her hemoglobin has is drifting down, she did not have hematemesis or hemoptysis. Heparin  and Plavix  were held. Hemoglobin appears to remain stable. Can probably restart Eliquis  and Plavix  tomorrow.  Leukocytosis Likely due to gangrene leukocytosis improving.  Essential hypertension Continue amlodipine  and metoprolol . Blood pressures relatively stable.  Microcytic anemia/acute blood loss anemia: MCV is low RDW is high. Patient does not remember what was the last colonoscopy. Hemoglobin ranging around 8-9 Continue ferrous sulfate .  Thrombocytosis: Likely reactive now improving.  PAF (paroxysmal atrial fibrillation) (HCC) He is to be rate controlled in sinus rhythm with metoprolol . Continue to hold Eliquis . She has had no further epistaxis.  Chronic heart failure with mildly reduced ejection fraction  (HFmrEF, 41-49%) (HCC) Continue torsemide  renal function is stabilized.  Peripheral artery disease (HCC) Continue to hold Plavix . Can probably restart tomorrow no further epistaxis.  Dementia (HCC) Continue home medications he is at risk of acute confusional state. Also had high risk of aspiration.  DVT prophylaxis: Lovenox Family Communication:none Status is: Inpatient Remains inpatient appropriate because: Right foot gangrene status post amputation    Code Status:     Code Status Orders  (From admission, onward)           Start     Ordered   10/15/23 1047  Full code  Continuous       Question:  By:  Answer:  Consent: discussion documented in EHR   10/15/23 1050           Code Status History     Date Active Date Inactive Code Status Order ID Comments User Context   05/23/2023 0208 05/28/2023 0224 Limited: Do not attempt resuscitation (DNR) -DNR-LIMITED -Do Not Intubate/DNI  [id number]  Walton Guppy, MD ED   05/16/2023 1123 05/19/2023 2208 Limited: Do not attempt resuscitation (DNR) -DNR-LIMITED -Do Not Intubate/DNI  [id number]  Shellye Dibble, NP Inpatient   05/04/2023 0004 05/16/2023 1123 Full Code [id number]  Jetty Mort, MD Inpatient   05/23/2020 1104 05/23/2020 1939 Full Code [id number]  Avanell Leigh, MD Inpatient         IV Access:   Peripheral IV   Procedures and diagnostic studies:   No results found.   Medical Consultants:   None.   Subjective:    Holly Guerra has no new complaints.  Objective:    Vitals:   10/18/23 1541 10/18/23 2042 10/19/23 0456 10/19/23 0736  BP: 105/83 109/66 112/64 110/82  Pulse: (!) 109 (!) 119 (!) 116 (!) 107  Resp:  17 17  18  Temp: 98.5 F (36.9 C) 98.3 F (36.8 C) 98.3 F (36.8 C) 98.2 F (36.8 C)  TempSrc:  Oral Oral Oral  SpO2: 99% 99% 100% 100%  Weight:      Height:       SpO2: 100 %   Intake/Output Summary (Last 24 hours) at 10/19/2023 0753 Last data filed at 10/18/2023 2300 Gross  per 24 hour  Intake 200.5 ml  Output --  Net 200.5 ml    Filed Weights   10/15/23 0553  Weight: 40.8 kg    Exam: General exam: In no acute distress. Respiratory system: Good air movement and clear to auscultation. Cardiovascular system: S1 & S2 heard, RRR. No JVD. Gastrointestinal system: Abdomen is nondistended, soft and nontender.  Extremities:  Skin: No rashes, lesions or ulcers Psychiatry: Judgement and insight appear normal. Mood & affect appropriate. Data Reviewed:    Labs: Basic Metabolic Panel: Recent Labs  Lab 10/15/23 0605 10/16/23 0459 10/17/23 0304  NA 138 133* 136  K 4.3 4.7 4.0  CL 102 100 105  CO2 26 25 25   GLUCOSE 127* 98 109*  BUN 33* 31* 26*  CREATININE 0.87  0.95 1.08* 0.92  CALCIUM  9.6 7.9* 8.1*   GFR Estimated Creatinine Clearance: 30.9 mL/min (by C-G formula based on SCr of 0.92 mg/dL). Liver Function Tests: Recent Labs  Lab 10/15/23 0605  AST 20  ALT 13  ALKPHOS 78  BILITOT 0.6  PROT 8.0  ALBUMIN 2.7*   No results for input(s): LIPASE, AMYLASE in the last 168 hours. No results for input(s): AMMONIA in the last 168 hours. Coagulation profile Recent Labs  Lab 10/15/23 0605  INR 1.2   COVID-19 Labs  Recent Labs    10/18/23 1012  FERRITIN 26    Lab Results  Component Value Date   SARSCOV2NAA NEGATIVE 05/03/2023   SARSCOV2NAA NEGATIVE 05/21/2020    CBC: Recent Labs  Lab 10/15/23 0605 10/16/23 0459 10/17/23 0304 10/18/23 1012 10/19/23 0556  WBC 14.2* 12.3* 9.4 8.1 8.1  HGB 9.5* 8.4* 7.7* 9.6* 8.8*  HCT 30.5* 26.4* 24.3* 31.1* 27.6*  MCV 72.6* 72.1* 71.3* 71.8* 70.6*  PLT 636* 555* 545* 649* 629*   Cardiac Enzymes: No results for input(s): CKTOTAL, CKMB, CKMBINDEX, TROPONINI in the last 168 hours. BNP (last 3 results) No results for input(s): PROBNP in the last 8760 hours. CBG: No results for input(s): GLUCAP in the last 168 hours. D-Dimer: No results for input(s): DDIMER in the last 72  hours. Hgb A1c: No results for input(s): HGBA1C in the last 72 hours. Lipid Profile: No results for input(s): CHOL, HDL, LDLCALC, TRIG, CHOLHDL, LDLDIRECT in the last 72 hours. Thyroid  function studies: No results for input(s): TSH, T4TOTAL, T3FREE, THYROIDAB in the last 72 hours.  Invalid input(s): FREET3 Anemia work up: Recent Labs    10/18/23 1012  VITAMINB12 525  FOLATE 11.7  FERRITIN 26  TIBC 400  IRON 48  RETICCTPCT 2.0   Sepsis Labs: Recent Labs  Lab 10/16/23 0459 10/17/23 0304 10/18/23 1012 10/19/23 0556  WBC 12.3* 9.4 8.1 8.1   Microbiology No results found for this or any previous visit (from the past 240 hours).   Medications:    allopurinol   100 mg Oral QPM   amLODipine   5 mg Oral Daily   calcium  carbonate  1 tablet Oral Q breakfast   cyclobenzaprine   10 mg Oral QHS   docusate sodium   100 mg Oral Daily   ferrous sulfate   325 mg Oral Q  breakfast   melatonin  3 mg Oral QHS   metoprolol  tartrate  12.5 mg Oral BID   nicotine   21 mg Transdermal Daily   pantoprazole   40 mg Oral Daily   sodium chloride  flush  10-40 mL Intracatheter Q12H   torsemide   10 mg Oral Daily   Continuous Infusions:  magnesium  sulfate bolus IVPB        LOS: 4 days   Macdonald Savoy  [facility]  10/19/2023, 7:53 AM

## 2023-10-19 NOTE — Progress Notes (Signed)
 Mobility Specialist Progress Note:    10/19/23 1500  Mobility  Activity Transferred from chair to bed  Level of Assistance +2 (takes two people)  Assistive Device None  RLE Weight Bearing Per Provider Order NWB  Activity Response Tolerated well  Mobility Referral Yes  Mobility visit 1 Mobility  Mobility Specialist Start Time (ACUTE ONLY) 1530  Mobility Specialist Stop Time (ACUTE ONLY) 1535  Mobility Specialist Time Calculation (min) (ACUTE ONLY) 5 min   Pt received trying to get up from chair w/ alarm sounding. Requesting assistance back to bed. Required maxA+2 to lateral scoot to bed. C/o BUE fatigue, otherwise asymptomatic. Pt left in bed w/ NT present.  D'Vante Nolon Baxter Mobility Specialist Please contact via Special educational needs teacher or Rehab office at [phone]

## 2023-10-19 NOTE — TOC Progression Note (Signed)
 Transition of Care [facility]) - Progression Note    Patient Details  Name: SENECA GADBOIS MRN: [id number] Date of Birth: [birth year]  Transition of Care [facility]) CM/SW Contact  Elspeth Hals, LCSW Phone Number: 10/19/2023, 1:02 PM  Clinical Narrative:   CSW confirmed with Nikki/[facility] that they can receive pt today.  New PT note needed for insurance auth.  PT aware.  1300: SNF auth request submitted in [place].     Expected Discharge Plan: Skilled Nursing Facility Barriers to Discharge: Continued Medical Work up  Expected Discharge Plan and Services     Post Acute Care Choice: Skilled Nursing Facility Living arrangements for the past 2 months: Single Family Home                                       Social Determinants of Health (SDOH) Interventions SDOH Screenings   Food Insecurity: Patient Unable To Answer (10/17/2023)  Housing: Patient Unable To Answer (10/17/2023)  Transportation Needs: No Transportation Needs (10/17/2023)  Utilities: Not At Risk (10/17/2023)  Alcohol Screen: Low Risk  (05/06/2023)  Financial Resource Strain: Low Risk  (05/06/2023)  Social Connections: Patient Unable To Answer (10/17/2023)  Tobacco Use: Medium Risk (10/15/2023)    Readmission Risk Interventions     No data to display

## 2023-10-19 NOTE — Progress Notes (Signed)
 Physical Therapy Treatment Patient Details Name: Holly Guerra MRN: [id number] DOB: 11/23/1940 Today's Date: 10/19/2023   History of Present Illness Pt is an 83 y.o. female admitted 10/15/23 with R foot gangrene and underwent R AKA. PMH: HTN, PAD, CHF, PAF, dementia, fall Jan 2025 with R hip fx s/p THA    PT Comments  Pt received in supine and agreeable to session. Pt reports trying to get up and is educated on the importance of calling for assist.  Pt able to complete bed mobility and transfer to the recliner with up to min A +2. Pt demonstrates good hip clearance with cues for lateral scoots. Pt confused and requires increased cues and redirection throughout session. Pt continues to benefit from PT services to progress toward functional mobility goals.    If plan is discharge home, recommend the following: A lot of help with walking and/or transfers;A lot of help with bathing/dressing/bathroom   Can travel by private vehicle     No  Equipment Recommendations  Other (comment) (TBD)    Recommendations for Other Services       Precautions / Restrictions Precautions Precautions: Fall Recall of Precautions/Restrictions: Impaired Restrictions Weight Bearing Restrictions Per Provider Order: Yes RLE Weight Bearing Per Provider Order: Non weight bearing     Mobility  Bed Mobility Overal bed mobility: Needs Assistance Bed Mobility: Supine to Sit     Supine to sit: Min assist, Used rails     General bed mobility comments: increased cues for sequencing and technique. Min A for trunk elevation    Transfers Overall transfer level: Needs assistance Equipment used: None Transfers: Bed to chair/wheelchair/BSC            Lateral/Scoot Transfers: Min assist, +2 physical assistance General transfer comment: Lateral scoot to recliner with min A +2 with bedpad. Pt using BUE and LLE with good hip clearance with cues for technique    Ambulation/Gait                   Stairs              Wheelchair Mobility     Tilt Bed    Modified Rankin (Stroke Patients Only)       Balance Overall balance assessment: Needs assistance Sitting-balance support: Feet unsupported, Bilateral upper extremity supported Sitting balance-Leahy Scale: Fair Sitting balance - Comments: sitting EOB       Standing balance comment: nt                            Communication Communication Communication: No apparent difficulties  Cognition Arousal: Alert Behavior During Therapy: WFL for tasks assessed/performed   PT - Cognitive impairments: No family/caregiver present to determine baseline, Orientation, Awareness, Memory, Attention, Safety/Judgement, Problem solving, Initiation, Sequencing                       PT - Cognition Comments: Pt continues to state she is at home after reorientation. Increased cues for sequencing and problem solving throughout session. Following commands: Impaired Following commands impaired: Follows one step commands inconsistently, Follows one step commands with increased time    Cueing Cueing Techniques: Verbal cues, Gestural cues, Tactile cues  Exercises      General Comments        Pertinent Vitals/Pain Pain Assessment Pain Assessment: Faces Faces Pain Scale: No hurt Pain Intervention(s): Monitored during session     PT Goals (current goals can now  be found in the care plan section) Acute Rehab PT Goals Patient Stated Goal: not stated PT Goal Formulation: Patient unable to participate in goal setting Time For Goal Achievement: 10/30/23 Progress towards PT goals: Progressing toward goals    Frequency    Min 2X/week       AM-PAC PT "6 Clicks" Mobility   Outcome Measure  Help needed turning from your back to your side while in a flat bed without using bedrails?: A Little Help needed moving from lying on your back to sitting on the side of a flat bed without using bedrails?: A Little Help needed  moving to and from a bed to a chair (including a wheelchair)?: A Lot Help needed standing up from a chair using your arms (e.g., wheelchair or bedside chair)?: Total Help needed to walk in hospital room?: Total Help needed climbing 3-5 steps with a railing? : Total 6 Click Score: 11    End of Session   Activity Tolerance: Patient tolerated treatment well Patient left: in chair;with chair alarm set;with nursing/sitter in room;with call bell/phone within reach Nurse Communication: Mobility status PT Visit Diagnosis: Other abnormalities of gait and mobility (R26.89);Muscle weakness (generalized) (M62.81)     Time: 1100-1116 PT Time Calculation (min) (ACUTE ONLY): 16 min  Charges:    $Therapeutic Activity: 8-22 mins PT General Charges $$ ACUTE PT VISIT: 1 Visit                     Michaelle Adolphus, PTA Acute Rehabilitation Services Secure Chat Preferred  [phone]    Michaelle Adolphus 10/19/2023, 12:20 PM

## 2023-10-19 NOTE — Progress Notes (Addendum)
  Progress Note    10/19/2023 7:50 AM 4 Days Post-Op  Subjective:  confused this morning.  No significant pain in R AKA    Vitals:   10/19/23 0456 10/19/23 0736  BP: 112/64 110/82  Pulse: (!) 116 (!) 107  Resp: 17 18  Temp: 98.3 F (36.8 C) 98.2 F (36.8 C)  SpO2: 100% 100%   Physical Exam: Lungs:  non labored Incisions:  R AKA well appearing with viable skin edges Neurologic: A&O  CBC    Component Value Date/Time   WBC 8.1 10/19/2023 0556   RBC 3.91 10/19/2023 0556   HGB 8.8 (L) 10/19/2023 0556   HGB 10.7 (L) 05/21/2020 0930   HCT 27.6 (L) 10/19/2023 0556   HCT 37.0 05/21/2020 0930   PLT 629 (H) 10/19/2023 0556   PLT 367 05/21/2020 0930   MCV 70.6 (L) 10/19/2023 0556   MCV 72 (L) 05/21/2020 0930   MCH 22.5 (L) 10/19/2023 0556   MCHC 31.9 10/19/2023 0556   RDW 17.6 (H) 10/19/2023 0556   RDW 21.0 (H) 05/21/2020 0930   LYMPHSABS 0.8 05/23/2023 0015   MONOABS 0.9 05/23/2023 0015   EOSABS 0.1 05/23/2023 0015   BASOSABS 0.0 05/23/2023 0015    BMET    Component Value Date/Time   NA 136 10/17/2023 0304   NA 137 05/21/2020 0930   K 4.0 10/17/2023 0304   CL 105 10/17/2023 0304   CO2 25 10/17/2023 0304   GLUCOSE 109 (H) 10/17/2023 0304   BUN 26 (H) 10/17/2023 0304   BUN 8 05/21/2020 0930   CREATININE 0.92 10/17/2023 0304   CALCIUM  8.1 (L) 10/17/2023 0304   GFRNONAA >60 10/17/2023 0304   GFRAA 74 05/21/2020 0930    INR    Component Value Date/Time   INR 1.2 10/15/2023 0605     Intake/Output Summary (Last 24 hours) at 10/19/2023 0750 Last data filed at 10/18/2023 2300 Gross per 24 hour  Intake 200.5 ml  Output --  Net 200.5 ml     Assessment/Plan:  83 y.o. 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  Staples to be removed in 5-6 weeks.  Ok for d/c to SNF when approved.  Our office will contact the patient to arrange follow up.   Cordie Deters, PA-C [facility] [phone] 10/19/2023 7:50 AM   I have independently interviewed and examined patient and agree with PA assessment and plan above.  Right AKA healing well.  She will follow-up to have staples removed in our office.  Ariday Brinker C. Vikki Graves, MD [facility] [place] Office: [phone] Pager: [phone]

## 2023-10-20 DIAGNOSIS — Z89511 Acquired absence of right leg below knee: Secondary | ICD-10-CM | POA: Diagnosis not present

## 2023-10-20 MED ORDER — POLYETHYLENE GLYCOL 3350 17 G PO PACK: 17.0000 g | PACK | Freq: Every day | ORAL | Status: AC

## 2023-10-20 MED ORDER — SALINE SPRAY 0.65 % NA SOLN: 1.0000 | NASAL | Status: AC | PRN

## 2023-10-20 MED ORDER — OXYCODONE HCL 5 MG PO TABS: 5.0000 mg | ORAL_TABLET | Freq: Four times a day (QID) | ORAL | 0 refills | Status: AC | PRN

## 2023-10-20 MED ORDER — MELATONIN 3 MG PO TABS: 3.0000 mg | ORAL_TABLET | Freq: Every day | ORAL | Status: AC

## 2023-10-20 MED ORDER — SALINE SPRAY 0.65 % NA SOLN
1.0000 | NASAL | Status: DC | PRN
  Filled 2023-10-20: qty 44

## 2023-10-20 MED ORDER — ACETAMINOPHEN 325 MG PO TABS: 650.0000 mg | ORAL_TABLET | ORAL | Status: AC | PRN

## 2023-10-20 NOTE — Progress Notes (Signed)
 Mobility Specialist Progress Note:    10/20/23 1100  Mobility  Activity Dangled on edge of bed  Level of Assistance +2 (takes two people)  Assistive Device None  RLE Weight Bearing Per Provider Order NWB  Activity Response Tolerated well  Mobility Referral Yes  Mobility visit 1 Mobility  Mobility Specialist Start Time (ACUTE ONLY) 1047  Mobility Specialist Stop Time (ACUTE ONLY) 1100  Mobility Specialist Time Calculation (min) (ACUTE ONLY) 13 min   Pt received in bed and agreeable. Initially planning to lateral scoot to chair. Required modA+2 for bed mobility. C/o L shoulder weakness and unable to scoot. Declined transfer to chair. Pt left in bed with bed alarm on and call bell in reach.  D'Vante Nolon Baxter Mobility Specialist Please contact via Special educational needs teacher or Rehab office at [phone]

## 2023-10-20 NOTE — Progress Notes (Signed)
 Holly Guerra to be D/C'd Skilled nursing facility per MD order.  Discussed with the patient and all questions fully answered. . IV catheter discontinued intact. Site without signs and symptoms of complications. Dressing and pressure applied.  An After Visit Summary was printed and signed prescription placed in packet for receiving facility.  Report called to [facility], LPN who will be taking over care of the patient on arrival to [facility].  Patient instructed to return to ED, call 911, or call MD for any changes in condition.   Patient escorted via stretcher, and D/C to [facility] via non-emergency ambulance.  [name] 10/20/2023 4:43 PM

## 2023-10-20 NOTE — Discharge Summary (Addendum)
 Physician Discharge Summary   Patient: Holly Guerra MRN: [id number] DOB: 1940/05/30  Admit date:     10/15/2023  Discharge date: 10/20/23  Discharge Physician: Joette Mustard   PCP: Lajuanda Pile, PA-C   Recommendations at discharge:    Repeat BMP to ensure renal function is stable/improving.  Will need staples removed from incision site with vascular surgery in 5-6 weeks   Discharge Diagnoses: Principal Problem:   S/P above knee amputation, right (HCC) Active Problems:   Leukocytosis   Essential hypertension   PAF (paroxysmal atrial fibrillation) (HCC)   Normocytic anemia   Chronic heart failure with mildly reduced ejection fraction (HFmrEF, 41-49%) (HCC)   Peripheral artery disease (HCC)   Dementia (HCC)   GERD (gastroesophageal reflux disease)  Resolved Problems:   * No resolved hospital problems. *  Hospital Course: Holly Guerra is an 83 y.o. female past medical history significant for essential hypertension, chronic atrial fibrillation on Eliquis , peripheral vascular disease, diagnosed with a right lower extremity gangrene patient was taken for above-the-knee right amputation and admission by Dr. Vikki Graves..  We have been consulted for medical management.   Assessment and Plan: Right lower extremity gangrene S/P above knee amputation, right (HCC): S/p R AKA 10/15/2023. Patient will go to SNF.  Will need F/u with vascular surgery in 5 weeks.   New Epistaxis: Had 2 large clots from the nose 6/10    Latest Ref Rng & Units 10/19/2023    5:56 AM 10/18/2023   10:12 AM 10/17/2023    3:04 AM  CBC  WBC 4.0 - 10.5 K/uL 8.1  8.1  9.4   Hemoglobin 12.0 - 15.0 g/dL 8.8  9.6  7.7   Hematocrit 36.0 - 46.0 % 27.6  31.1  24.3   Platelets 150 - 400 K/uL 629  649  545    Hgb has fluctuated but appears stable.   Plavix  and eliquis  will be resumed on discharge.   Leukocytosis Resolved.   Essential HTN Continue amlodipine  and metoprolol     Microcytic anemia  Iron deficiency    Continue ferous sulfate. Will need stool softener while on this medication and vitamin C.  Patient does not remember when last colonoscopy was can work this up further with her PCP.    Thrombocytosis  Likely acute phase reactant. Possible compenent of Fe def anemia as well.  No intervention required at this time.   HFmrEF Stable.  Continue torsemide .   PAD Plavix  resumed.   Dementia  Continue home medications.   PAF (atrial fibrillation) NSR.  Eliquis  briefly held due to epistaxis. Will be resumed on discharge.  Metoprolol  was continued.        Pain control - [place]  Controlled Substance Reporting System database was reviewed. and patient was instructed, not to drive, operate heavy machinery, perform activities at heights, swimming or participation in water activities or provide baby-sitting services while on Pain, Sleep and Anxiety Medications; until their outpatient Physician has advised to do so again. Also recommended to not to take more than prescribed Pain, Sleep and Anxiety Medications.  Consultants: Vascular surgery  Procedures performed: R AKA   Disposition: Skilled nursing facility Diet recommendation:  Cardiac diet DISCHARGE MEDICATION: Allergies as of 10/20/2023       Reactions   Lactose Intolerance (gi) Diarrhea        Medication List     STOP taking these medications    calcium  carbonate 500 MG chewable tablet Commonly known as: TUMS - dosed in mg elemental  calcium    hydrOXYzine  25 MG tablet Commonly known as: ATARAX    ibuprofen  200 MG tablet Commonly known as: ADVIL        TAKE these medications    acetaminophen  325 MG tablet Commonly known as: TYLENOL  Take 2 tablets (650 mg total) by mouth every 4 (four) hours as needed for mild pain (pain score 1-3) or fever (or Fever >/= 101). What changed:  medication strength how much to take when to take this reasons to take this   allopurinol  100 MG tablet Commonly known as:  ZYLOPRIM  Take 1 tablet (100 mg total) by mouth every evening.   amLODipine  5 MG tablet Commonly known as: NORVASC  Take 5 mg by mouth daily.   apixaban  2.5 MG Tabs tablet Commonly known as: ELIQUIS  Take 2.5 mg by mouth 2 (two) times daily. What changed: Another medication with the same name was removed. Continue taking this medication, and follow the directions you see here.   CALCIUM  CARBONATE PO Take 1 tablet by mouth daily.   clopidogrel  75 MG tablet Commonly known as: Plavix  Take 1 tablet (75 mg total) by mouth daily.   cyanocobalamin  1000 MCG/ML injection Commonly known as: VITAMIN B12 Inject 1 mL (1,000 mcg total) into the muscle every 30 (thirty) days.   cyclobenzaprine  10 MG tablet Commonly known as: FLEXERIL  Take 10 mg by mouth at bedtime.   feeding supplement (PRO-STAT 64) Liqd Take 30 mLs by mouth 2 (two) times daily.   ferrous sulfate  325 (65 FE) MG tablet Take 325 mg by mouth daily with breakfast.   fluticasone 50 MCG/ACT nasal spray Commonly known as: FLONASE Place 1 spray into both nostrils daily.   melatonin 3 MG Tabs tablet Take 1 tablet (3 mg total) by mouth at bedtime.   metoprolol  tartrate 25 MG tablet Commonly known as: LOPRESSOR  Take 12.5 mg by mouth 2 (two) times daily.   multivitamin with minerals tablet Take 1 tablet by mouth daily. What changed: Another medication with the same name was removed. Continue taking this medication, and follow the directions you see here.   nicotine  21 mg/24hr patch Commonly known as: NICODERM CQ  - dosed in mg/24 hours Place 21 mg onto the skin daily.   omeprazole 40 MG capsule Commonly known as: PRILOSEC Take 40 mg by mouth every evening.   oxyCODONE  5 MG immediate release tablet Commonly known as: Roxicodone  Take 1 tablet (5 mg total) by mouth every 6 (six) hours as needed for up to 5 days for severe pain (pain score 7-10).   polyethylene glycol 17 g packet Commonly known as: MIRALAX / GLYCOLAX Take  17 g by mouth daily.   sodium chloride  0.65 % Soln nasal spray Commonly known as: OCEAN Place 1 spray into both nostrils as needed for congestion.   thiamine  100 MG tablet Commonly known as: VITAMIN B1 Take 100 mg by mouth daily.   torsemide  10 MG tablet Commonly known as: DEMADEX  Take 10 mg by mouth daily.   Vitamin D (Ergocalciferol) 1.25 MG (50000 UNIT) Caps capsule Commonly known as: DRISDOL Take 50,000 Units by mouth every 7 (seven) days.        Contact information for follow-up providers     [facility] Follow up in 5 week(s).   Specialty: Vascular Surgery Why: Office will call you to arrange your appt (sent). Contact information: [address], [address] [place]  [address] [phone]  Contact information for after-discharge care     Destination     [facility] Preferred SNF .   Service: Skilled Nursing Contact information: [address] [place]  [address] [phone]                    Discharge Exam: Holly Guerra Weights   10/15/23 0553  Weight: 40.8 kg   Physical Exam  Constitutional: In no distress.  Cardiovascular: Normal rate, regular rhythm. No lower extremity edema  Pulmonary: Non labored breathing on room air, no wheezing or rales.  Abdominal: Soft. Normal bowel sounds. Non distended and non tender Musculoskeletal: R limb incision intact, no drianage, s/p R AKA,  Neurological: Alert and oriented to person, (stated it is 24) and knew she was in [place] did not know she was in a hospital stated she was in a biblical place  Skin: Skin is warm and dry.    Condition at discharge: good  The results of significant diagnostics from this hospitalization (including imaging, microbiology, ancillary and laboratory) are listed below for reference.   Imaging Studies: VAS US  ABI WITH/WO TBI Result Date: 09/21/2023  LOWER  EXTREMITY DOPPLER STUDY Patient Name:  Holly Guerra  Date of Exam:   09/21/2023 Medical Rec #: [id number]     Accession #:    [id number] Date of Birth: Sep 24, 1940     Patient Gender: F Patient Age:   27 years Exam Location:  [address] Procedure:      VAS US  ABI WITH/WO TBI Referring Phys: Holly Guerra --------------------------------------------------------------------------------  Indications: Ulceration, and peripheral artery disease. Patient reports painful              toes on the right foot particularly the great toe. The right great              toe is blackened and bleeding. Ulcerations to the bilateral dorsal              foot, and right heel. High Risk Factors: Hypertension, hyperlipidemia, past history of smoking. Recent                    smoke cessation x 2 months.  Comparison Study: In 06/2023, a lower arterial Doppler showed an ABI of .54 on                   the right and 1.02 on the left. Performing Technologist: Doren Gammons RVT  Examination Guidelines: A complete evaluation includes at minimum, Doppler waveform signals and systolic blood pressure reading at the level of bilateral brachial, anterior tibial, and posterior tibial arteries, when vessel segments are accessible. Bilateral testing is considered an integral part of a complete examination. Photoelectric Plethysmograph (PPG) waveforms and toe systolic pressure readings are included as required and additional duplex testing as needed. Limited examinations for reoccurring indications may be performed as noted.  ABI Findings: +---------+------------------+-----+-------------------+---------+ Right    Rt Pressure (mmHg)IndexWaveform           Comment   +---------+------------------+-----+-------------------+---------+ Brachial 135                                                 +---------+------------------+-----+-------------------+---------+ PTA      43                0.32 dampened monophasic           +---------+------------------+-----+-------------------+---------+  DP       0                 0.00 absent             inaudible +---------+------------------+-----+-------------------+---------+ Great Toe0                 0.00 Absent                       +---------+------------------+-----+-------------------+---------+ +---------+------------------+-----+----------+-------+ Left     Lt Pressure (mmHg)IndexWaveform  Comment +---------+------------------+-----+----------+-------+ Brachial 131                                      +---------+------------------+-----+----------+-------+ PTA      116               0.86 monophasic        +---------+------------------+-----+----------+-------+ DP       94                0.70 monophasic        +---------+------------------+-----+----------+-------+ Great Toe89                0.66 Abnormal          +---------+------------------+-----+----------+-------+ +-------+-----------+-----------+------------+------------+ ABI/TBIToday's ABIToday's TBIPrevious ABIPrevious TBI +-------+-----------+-----------+------------+------------+ Right  .32        0.0        .54         .33          +-------+-----------+-----------+------------+------------+ Left   .86        .66        1.02        .70          +-------+-----------+-----------+------------+------------+  Bilateral ABIs and TBIs appear decreased compared to prior study on 06/23/2023.  Summary: Right: Resting right ankle-brachial index indicates severe right lower extremity arterial disease. The right toe-brachial index is abnormal. Left: Resting left ankle-brachial index indicates mild left lower extremity arterial disease. The left toe-brachial index is abnormal. *See table(s) above for measurements and observations.  Electronically signed by Jimmye Moulds MD on 09/21/2023 at 3:05:06 PM.    Final     Microbiology: Results for orders placed or performed during the  hospital encounter of 05/22/23  Surgical pcr screen     Status: None   Collection Time: 05/24/23  4:47 PM   Specimen: Nasal Mucosa; Nasal Swab  Result Value Ref Range Status   MRSA, PCR NEGATIVE NEGATIVE Final   Staphylococcus aureus NEGATIVE NEGATIVE Final    Comment: (NOTE) The Xpert SA Assay (FDA approved for NASAL specimens in patients 68 years of age and older), is one component of a comprehensive surveillance program. It is not intended to diagnose infection nor to guide or monitor treatment. Performed at [facility], [address], [place], [place] [address]     Labs: CBC: Recent Labs  Lab 10/15/23 [phone] 10/16/23 0459 10/17/23 0304 10/18/23 1012 10/19/23 0556  WBC 14.2* 12.3* 9.4 8.1 8.1  HGB 9.5* 8.4* 7.7* 9.6* 8.8*  HCT 30.5* 26.4* 24.3* 31.1* 27.6*  MCV 72.6* 72.1* 71.3* 71.8* 70.6*  PLT 636* 555* 545* 649* 629*   Basic Metabolic Panel: Recent Labs  Lab 10/15/23 0605 10/16/23 0459 10/17/23 0304  NA 138 133* 136  K 4.3 4.7 4.0  CL 102 100 105  CO2 26 25 25   GLUCOSE 127* 98 109*  BUN 33* 31* 26*  CREATININE 0.87  0.95 1.08* 0.92  CALCIUM  9.6 7.9* 8.1*   Liver Function Tests: Recent Labs  Lab 10/15/23 0605  AST 20  ALT 13  ALKPHOS 78  BILITOT 0.6  PROT 8.0  ALBUMIN 2.7*   CBG: No results for input(s): GLUCAP in the last 168 hours.  Discharge time spent: greater than 30 minutes.  Signed: Joette Mustard, MD [facility] 10/20/2023

## 2023-10-20 NOTE — TOC Transition Note (Signed)
 Transition of Care [facility]) - Discharge Note   Patient Details  Name: Holly Guerra MRN: [id number] Date of Birth: 09/25/1940  [facility]) CM/SW Contact:  Elspeth Hals, LCSW Phone Number: 10/20/2023, 2:04 PM   Clinical Narrative:   Pt discharging to [facility].  RN call report to [phone].  PTAR called 1400.      Final next level of care: Skilled Nursing Facility Barriers to Discharge: Barriers Resolved   Patient Goals and CMS Choice Patient states their goals for this hospitalization and ongoing recovery are:: To get better   Choice offered to / list presented to : Patient, Adult Children      Discharge Placement              Patient chooses bed at: [facility] Patient to be transferred to facility by: ptar Name of family member notified: daughter Burdette Carolin Patient and family notified of of transfer: 10/20/23  Discharge Plan and Services Additional resources added to the After Visit Summary for       Post Acute Care Choice: Skilled Nursing Facility                               Social Drivers of Health (SDOH) Interventions SDOH Screenings   Food Insecurity: Patient Unable To Answer (10/17/2023)  Housing: Patient Unable To Answer (10/17/2023)  Transportation Needs: No Transportation Needs (10/17/2023)  Utilities: Not At Risk (10/17/2023)  Alcohol Screen: Low Risk  (05/06/2023)  Financial Resource Strain: Low Risk  (05/06/2023)  Social Connections: Patient Unable To Answer (10/17/2023)  Tobacco Use: Medium Risk (10/15/2023)     Readmission Risk Interventions     No data to display

## 2023-10-20 NOTE — TOC Progression Note (Signed)
 Transition of Care [facility]) - Progression Note    Patient Details  Name: Holly Guerra MRN: [id number] Date of Birth: 10/11/1940  [facility]) CM/SW Contact  Elspeth Hals, LCSW Phone Number: 10/20/2023, 9:46 AM  Clinical Narrative:   SNF auth approved: [id number], 3 days: 6/10-6/12.    Expected Discharge Plan: Skilled Nursing Facility Barriers to Discharge: Continued Medical Work up  Expected Discharge Plan and Services     Post Acute Care Choice: Skilled Nursing Facility Living arrangements for the past 2 months: Single Family Home                                       Social Determinants of Health (SDOH) Interventions SDOH Screenings   Food Insecurity: Patient Unable To Answer (10/17/2023)  Housing: Patient Unable To Answer (10/17/2023)  Transportation Needs: No Transportation Needs (10/17/2023)  Utilities: Not At Risk (10/17/2023)  Alcohol Screen: Low Risk  (05/06/2023)  Financial Resource Strain: Low Risk  (05/06/2023)  Social Connections: Patient Unable To Answer (10/17/2023)  Tobacco Use: Medium Risk (10/15/2023)    Readmission Risk Interventions     No data to display

## 2023-10-21 DIAGNOSIS — Z89611 Acquired absence of right leg above knee: Principal | ICD-10-CM

## 2023-10-27 ENCOUNTER — Encounter (HOSPITAL_COMMUNITY)

## 2023-10-27 ENCOUNTER — Ambulatory Visit: Admitting: Vascular Surgery

## 2023-11-09 ENCOUNTER — Encounter: Payer: Self-pay | Admitting: Cardiovascular Disease

## 2023-11-09 ENCOUNTER — Encounter: Payer: Self-pay | Admitting: Vascular Surgery

## 2023-11-10 ENCOUNTER — Telehealth: Payer: Self-pay | Admitting: Licensed Clinical Social Worker

## 2023-11-10 NOTE — Telephone Encounter (Signed)
 H&V Care Navigation CSW Progress Note  Clinical Social Worker contacted caregiver by phone (pt daughter [name], [place] on file) to f/u on referral from [facility] for daughter request. Was able to reach her at [phone]. I shared that I had reached out to [facility], admissions Interior and spatial designer for [facility] who shared with me that private rooms are based on coverage and private pay, pt currently private paying while in process of applying for Medicaid. The facility unfortunately does not accept letters of medical necessity for private rooms. I was able to explain that to pt daughter, encouraged her if concerns to speak with [facility] about moving rooms or other accommodations that can be made. I let her know if additional ways to assist in the future arose to reach out to me or clinic to see if we can further assist. Remain available as needed, no additional questions at this time.   Patient is participating in a Managed Medicaid Plan:  No, UHC Medicare  SDOH Screenings   Food Insecurity: Patient Unable To Answer (10/17/2023)  Housing: Patient Unable To Answer (10/17/2023)  Transportation Needs: No Transportation Needs (10/17/2023)  Utilities: Not At Risk (10/17/2023)  Alcohol Screen: Low Risk  (05/06/2023)  Financial Resource Strain: Low Risk  (05/06/2023)  Social Connections: Patient Unable To Answer (10/17/2023)  Tobacco Use: Medium Risk (10/28/2023)   Received from [facility]    Marit Lark, MSW, LCSW Clinical Social Worker II [facility] Heart/Vascular Care Navigation  [phone]- work cell phone (preferred)

## 2023-11-24 ENCOUNTER — Ambulatory Visit: Attending: Vascular Surgery | Admitting: Physician Assistant

## 2023-11-24 VITALS — BP 106/64 | HR 80 | Temp 98.2°F | Wt 90.0 lb

## 2023-11-24 DIAGNOSIS — I70261 Atherosclerosis of native arteries of extremities with gangrene, right leg: Secondary | ICD-10-CM

## 2023-11-24 NOTE — Progress Notes (Signed)
 POST OPERATIVE OFFICE NOTE    CC:  F/u for surgery  HPI:  This is a 83 y.o. female who is s/p right AKA.  She is here today with her daughter.  She is currently in rehabilitation at [facility].  She believes her incision is healed.  She unfortunately suffers with phantom pain.  Allergies  Allergen Reactions   Lactose Intolerance (Gi) Diarrhea    Current Outpatient Medications  Medication Sig Dispense Refill   acetaminophen  (TYLENOL ) 325 MG tablet Take 2 tablets (650 mg total) by mouth every 4 (four) hours as needed for mild pain (pain score 1-3) or fever (or Fever >/= 101).     allopurinol  (ZYLOPRIM ) 100 MG tablet Take 1 tablet (100 mg total) by mouth every evening. 30 tablet 0   Amino Acids-Protein Hydrolys (FEEDING SUPPLEMENT, PRO-STAT 64,) LIQD Take 30 mLs by mouth 2 (two) times daily.     amLODipine  (NORVASC ) 5 MG tablet Take 5 mg by mouth daily.     apixaban  (ELIQUIS ) 2.5 MG TABS tablet Take 2.5 mg by mouth 2 (two) times daily.     Calcium  Carbonate Antacid (CALCIUM  CARBONATE PO) Take 1 tablet by mouth daily.     clopidogrel  (PLAVIX ) 75 MG tablet Take 1 tablet (75 mg total) by mouth daily. 30 tablet 11   cyanocobalamin  (VITAMIN B12) 1000 MCG/ML injection Inject 1 mL (1,000 mcg total) into the muscle every 30 (thirty) days.     cyclobenzaprine  (FLEXERIL ) 10 MG tablet Take 10 mg by mouth at bedtime.     ferrous sulfate  325 (65 FE) MG tablet Take 325 mg by mouth daily with breakfast.     fluticasone (FLONASE) 50 MCG/ACT nasal spray Place 1 spray into both nostrils daily.     melatonin 3 MG TABS tablet Take 1 tablet (3 mg total) by mouth at bedtime.     metoprolol  tartrate (LOPRESSOR ) 25 MG tablet Take 12.5 mg by mouth 2 (two) times daily.     Multiple Vitamins-Minerals (MULTIVITAMIN WITH MINERALS) tablet Take 1 tablet by mouth daily.     nicotine  (NICODERM CQ  - DOSED IN MG/24 HOURS) 21 mg/24hr patch Place 21 mg onto the skin daily.     omeprazole (PRILOSEC) 40 MG capsule Take 40 mg  by mouth every evening.     polyethylene glycol (MIRALAX  / GLYCOLAX ) 17 g packet Take 17 g by mouth daily.     sodium chloride  (OCEAN) 0.65 % SOLN nasal spray Place 1 spray into both nostrils as needed for congestion.     thiamine  (VITAMIN B1) 100 MG tablet Take 100 mg by mouth daily.     torsemide  (DEMADEX ) 10 MG tablet Take 10 mg by mouth daily.     Vitamin D, Ergocalciferol, (DRISDOL) 1.25 MG (50000 UNIT) CAPS capsule Take 50,000 Units by mouth every 7 (seven) days.     No current facility-administered medications for this visit.     ROS:  See HPI  Physical Exam:  Vitals:   11/24/23 1006  BP: 106/64  Pulse: 80  Temp: 98.2 F (36.8 C)  TempSrc: Temporal  Weight: 90 lb (40.8 kg)    Incision:  R AKA healed Extremities:  no tissue loss L foot Neuro: A&O  Assessment/Plan:  This is a 83 y.o. female who is s/p: Right AKA  Right AKA well-healed.  All staples were removed without difficulty.  She is optimistic to walk with a prosthetic however I tried to realign her goals to be able to pivot and transfer with her  left foot from chairs, beds, toilets, etc.  I have also recommended a gabapentin prescription 300 mg daily to help with phantom pain.  I informed her and her daughter that we cannot continue prescribing narcotics this far out from surgery.  We will recheck left ABI in 1 year.  Please call with any questions or concerns.   Donnice Sender, PA-C [facility] [phone]  Clinic MD:  Sheree

## 2023-12-09 ENCOUNTER — Ambulatory Visit (HOSPITAL_COMMUNITY)
Admission: RE | Admit: 2023-12-09 | Discharge: 2023-12-09 | Disposition: A | Discharge status: Home / Self Care | Source: Ambulatory Visit | Attending: Cardiology | Admitting: Cardiology

## 2023-12-09 DIAGNOSIS — I272 Pulmonary hypertension, unspecified: Secondary | ICD-10-CM | POA: Insufficient documentation

## 2023-12-09 DIAGNOSIS — I351 Nonrheumatic aortic (valve) insufficiency: Secondary | ICD-10-CM | POA: Insufficient documentation

## 2023-12-09 DIAGNOSIS — I480 Paroxysmal atrial fibrillation: Secondary | ICD-10-CM | POA: Insufficient documentation

## 2023-12-09 DIAGNOSIS — I5022 Chronic systolic (congestive) heart failure: Secondary | ICD-10-CM | POA: Diagnosis present

## 2023-12-09 LAB — ECHOCARDIOGRAM COMPLETE
AR max vel: 1.26 cm2
AV Area VTI: 1.3 cm2
AV Area mean vel: 1.21 cm2
AV Mean grad: 13.5 mmHg
AV Peak grad: 23.8 mmHg
Ao pk vel: 2.44 m/s
Area-P 1/2: 3.6 cm2
P 1/2 time: 414 ms
S' Lateral: 2.4 cm

## 2023-12-10 ENCOUNTER — Ambulatory Visit: Payer: Self-pay | Admitting: Physician Assistant

## 2023-12-10 DIAGNOSIS — I350 Nonrheumatic aortic (valve) stenosis: Secondary | ICD-10-CM | POA: Insufficient documentation

## 2023-12-10 DIAGNOSIS — I351 Nonrheumatic aortic (valve) insufficiency: Secondary | ICD-10-CM | POA: Insufficient documentation

## 2023-12-20 ENCOUNTER — Other Ambulatory Visit (INDEPENDENT_AMBULATORY_CARE_PROVIDER_SITE_OTHER): Payer: Self-pay

## 2023-12-20 ENCOUNTER — Encounter: Payer: Self-pay | Admitting: Physician Assistant

## 2023-12-20 ENCOUNTER — Other Ambulatory Visit: Payer: Self-pay

## 2023-12-20 ENCOUNTER — Ambulatory Visit: Admitting: Physician Assistant

## 2023-12-20 DIAGNOSIS — M19012 Primary osteoarthritis, left shoulder: Secondary | ICD-10-CM

## 2023-12-20 MED ORDER — TRIAMCINOLONE ACETONIDE 40 MG/ML IJ SUSP
40.0000 mg | INTRAMUSCULAR | Status: AC | PRN
  Administered 2023-12-20 (×2): 40 mg via INTRA_ARTICULAR

## 2023-12-20 MED ORDER — BUPIVACAINE HCL 0.25 % IJ SOLN
9.0000 mL | INTRAMUSCULAR | Status: AC | PRN
  Administered 2023-12-20 (×2): 9 mL via INTRA_ARTICULAR

## 2023-12-20 NOTE — Progress Notes (Signed)
 Office Visit Note   Patient: Holly Guerra [facility]           Date of Birth: [birth year]           MRN: [id number] Visit Date: 12/20/2023              Requested by: Elvera Ozell [name] [facility]  [address] [place],  [place] [address] PCP: Pcp, No    Assessment & Plan: Visit Diagnoses:  1. Arthritis of left glenohumeral joint     Plan: Given the significant arthritis she has and the fact she is on long-term anticoagulants with cardiac comorbidities, recommend an intra-articular injection left shoulder under ultrasound she was agreeable.  Follow-Up Instructions: Return if symptoms worsen or fail to improve.   Orders:  Orders Placed This Encounter  Procedures   Large Joint Inj: L glenohumeral   XR Shoulder Left   US  Guided Needle Placement - No Linked Charges   No orders of the defined types were placed in this encounter.     Procedures: No procedures performed   Clinical Data: No additional findings.   Subjective: Chief Complaint  Patient presents with   Left Shoulder - Pain    HPI Holly Guerra 83 year old female who comes in today with left shoulder arm pain for the last month.  No acute injury.  She notes decreased range of motion of the shoulder.  She has had no injections no surgeries.  She does take tramadol  for pain.   Review of Systems  Constitutional:  Negative for chills and fever.  Musculoskeletal:  Positive for arthralgias.     Objective: Vital Signs: There were no vitals taken for this visit.  Physical Exam Constitutional:      Appearance: She is not ill-appearing or diaphoretic.  Pulmonary:     Effort: Pulmonary effort is normal.  Neurological:     Mental Status: She is alert and oriented to person, place, and time.  Psychiatric:        Mood and Affect: Mood normal.     Ortho Exam Left shoulder were: Limited range of motion.  Passively I can bring her to approximately 80 degrees forward flexion.  Internal/external rotation  reveals significant crepitus that is audible.  This causes significant pain.  Specialty Comments:  No specialty comments available.  Imaging: XR Shoulder Left Result Date: 12/20/2023 Left shoulder: Shoulder is well located.  End-stage arthritis with periarticular spurring and bone-on-bone arthritis.  No acute fractures acute findings.  US  Guided Needle Placement - No Linked Charges Result Date: 12/20/2023 Ultrasound imaging demonstrates needle placement into the glenohumeral joint with injection of fluid and no complication.     PMFS History: Patient Active Problem List   Diagnosis Date Noted   Nonrheumatic aortic valve insufficiency 12/10/2023   Nonrheumatic aortic (valve) stenosis 12/10/2023   S/P above knee amputation, right (HCC) 10/21/2023   S/P below knee amputation, right (HCC) 10/15/2023   Leukocytosis 10/15/2023   Normocytic anemia 10/15/2023   Dementia (HCC) 10/15/2023   Closed right hip fracture, initial encounter (HCC) 05/23/2023   PAF (paroxysmal atrial fibrillation) (HCC) 05/23/2023   Chronic heart failure with mildly reduced ejection fraction (HFmrEF, 41-49%) (HCC) 05/23/2023   Esophageal stricture 05/17/2023   Loss of weight 05/11/2023   Protein-calorie malnutrition, severe 05/10/2023   Dysphagia 05/10/2023   Abnormal barium swallow 05/10/2023   Chronic anticoagulation 05/10/2023   Microcytic anemia 05/04/2023   GERD (gastroesophageal reflux disease) 05/04/2023   Pernicious anemia 05/04/2023   Hypertensive  crisis 05/04/2023   Acute heart failure (HCC) 05/03/2023   Peripheral artery disease (HCC) 07/23/2012   Essential hypertension 07/23/2012   HLD (hyperlipidemia) 07/23/2012   Tobacco abuse 07/23/2012   Past Medical History:  Diagnosis Date   Abdominal bruit    Abnormal echocardiogram    Anemia    Aortic insufficiency    Aortic root dilation (HCC)    Aortic valve disorder    Arthritis    all over (07/22/2012)   Arthropathy    Atrial fibrillation  (HCC)    Bilateral carotid artery stenosis    Carotid artery disease (HCC)    Chest discomfort    Dementia (HCC)    Edema    Exertional shortness of breath    Fatigue    GERD (gastroesophageal reflux disease)    Gout    Hypercholesteremia    Hypertension    Mitral regurgitation and aortic stenosis    Mixed hyperlipidemia    Murmur, cardiac    Peripheral vascular disease (HCC)    Pernicious anemia    PVD (peripheral vascular disease) (HCC)    Shortness of breath    Sickle-cell trait (HCC)    Thoracic aortic aneurysm (HCC)    Tobacco use    URI (upper respiratory infection)     Family History  Problem Relation Age of Onset   Hypertension Mother    Heart disease Mother     Past Surgical History:  Procedure Laterality Date   ABDOMINAL AORTOGRAM W/LOWER EXTREMITY Bilateral 05/23/2020   Procedure: ABDOMINAL AORTOGRAM W/LOWER EXTREMITY;  Surgeon: Court Dorn PARAS, MD;  Location: [facility] INVASIVE CV LAB;  Service: Cardiovascular;  Laterality: Bilateral;   ABDOMINAL HYSTERECTOMY  01/09/1969   AMPUTATION Right 10/15/2023   Procedure: AMPUTATION, ABOVE KNEE;  Surgeon: Sheree Penne Bruckner, MD;  Location: [facility] OR;  Service: Vascular;  Laterality: Right;   APPENDECTOMY  01/09/1969   CARDIOVASCULAR STRESS TEST  06/16/2012   normal myocardial perfusion imaging, LV systolic function was normal without regional wall motion abnormalities, LV EF 60%   DOPPLER ECHOCARDIOGRAPHY  06/16/2012   EF 60-65%, mild concetric LVH, moderate aortic regurg   ESOPHAGOGASTRODUODENOSCOPY (EGD) WITH PROPOFOL  N/A 05/15/2023   Procedure: ESOPHAGOGASTRODUODENOSCOPY (EGD) WITH PROPOFOL ;  Surgeon: Rollin Dover, MD;  Location: [facility] ENDOSCOPY;  Service: Gastroenterology;  Laterality: N/A;   HIP ARTHROPLASTY Right 05/25/2023   Procedure: ARTHROPLASTY BIPOLAR HIP (HEMIARTHROPLASTY);  Surgeon: Vernetta Bruckner GRADE, MD;  Location: [facility] OR;  Service: Orthopedics;  Laterality: Right;   LOWER EXTREMITY ANGIOGRAM N/A  07/22/2012   Procedure: LOWER EXTREMITY ANGIOGRAM;  Surgeon: Dorn PARAS Court, MD;  Location: [facility] CATH LAB;  Service: Cardiovascular;  Laterality: N/A;   LOWER EXTREMITY ARTERIAL DOPPLER  08/08/2012   Left SFA stent-open and patent without evidence of hemodynamically significant stenosis   PERCUTANEOUS STENT INTERVENTION Left 07/22/2012   SAVORY DILATION N/A 05/15/2023   Procedure: SAVORY DILATION;  Surgeon: Rollin Dover, MD;  Location: [facility] ENDOSCOPY;  Service: Gastroenterology;  Laterality: N/A;  16mm   TOTAL THYROIDECTOMY  ~ 2010   Social History   Occupational History   Occupation: retired  Tobacco Use   Smoking status: Former    Current packs/day: 0.00    Average packs/day: 0.5 packs/day for 45.0 years (22.5 ttl pk-yrs)    Types: Cigarettes    Start date: 05/11/1962    Quit date: 05/12/2007    Years since quitting: 16.6   Smokeless tobacco: Never  Vaping Use   Vaping status: Never Used  Substance and Sexual Activity  Alcohol use: Not Currently    Comment: no alcohol since around 04/2023   Drug use: No   Sexual activity: Not on file

## 2023-12-20 NOTE — Progress Notes (Signed)
   Procedure Note  Patient: Holly Guerra [facility]             Date of Birth: 21-Dec-1940           MRN: [id number]             Visit Date: 12/20/2023  Procedures: Visit Diagnoses:  1. Arthritis of left glenohumeral joint     Large Joint Inj: L glenohumeral on 12/20/2023 2:42 PM Indications: pain and diagnostic evaluation Details: 22 G 3.5 in needle, ultrasound-guided posterior approach Medications: 9 mL bupivacaine  0.25 %; 40 mg triamcinolone  acetonide 40 MG/ML Outcome: tolerated well, no immediate complications Procedure, treatment alternatives, risks and benefits explained, specific risks discussed. Consent was given by the patient. Immediately prior to procedure a time out was called to verify the correct patient, procedure, equipment, support staff and site/side marked as required. Patient was prepped and draped in the usual sterile fashion.

## 2023-12-20 NOTE — Progress Notes (Cosign Needed)
   Procedure Note  Patient: Holly Guerra [facility]             Date of Birth: May 21, 1940           MRN: [id number]             Visit Date: 12/20/2023  Procedures: Visit Diagnoses:  1. Arthritis of left glenohumeral joint     Large Joint Inj: L glenohumeral on 12/20/2023 2:42 PM Indications: pain and diagnostic evaluation Details: 22 G 3.5 in needle, ultrasound-guided posterior approach Medications: 9 mL bupivacaine  0.25 %; 40 mg triamcinolone  acetonide 40 MG/ML Outcome: tolerated well, no immediate complications Procedure, treatment alternatives, risks and benefits explained, specific risks discussed. Consent was given by the patient. Immediately prior to procedure a time out was called to verify the correct patient, procedure, equipment, support staff and site/side marked as required. Patient was prepped and draped in the usual sterile fashion.

## 2024-01-17 ENCOUNTER — Ambulatory Visit: Attending: Cardiovascular Disease | Admitting: Cardiovascular Disease

## 2024-01-17 ENCOUNTER — Encounter: Payer: Self-pay | Admitting: Cardiovascular Disease

## 2024-01-17 VITALS — BP 110/60 | HR 94 | Ht 61.0 in | Wt 100.0 lb

## 2024-01-17 DIAGNOSIS — E782 Mixed hyperlipidemia: Secondary | ICD-10-CM | POA: Diagnosis not present

## 2024-01-17 DIAGNOSIS — I480 Paroxysmal atrial fibrillation: Secondary | ICD-10-CM | POA: Diagnosis not present

## 2024-01-17 DIAGNOSIS — I739 Peripheral vascular disease, unspecified: Secondary | ICD-10-CM

## 2024-01-17 DIAGNOSIS — I10 Essential (primary) hypertension: Secondary | ICD-10-CM

## 2024-01-17 DIAGNOSIS — I5022 Chronic systolic (congestive) heart failure: Secondary | ICD-10-CM

## 2024-01-17 DIAGNOSIS — I350 Nonrheumatic aortic (valve) stenosis: Secondary | ICD-10-CM

## 2024-01-17 NOTE — Progress Notes (Signed)
 01/17/2024 Holly Guerra [facility]   04/20/1941  [id number]  Primary Physician Pcp, No Primary Cardiologist: Dorn PARAS Lesches MD [place] [place], [place]  HPI:  Holly Guerra is a 83 y.o.     thin appearing widowed African-American female mother of 2 children, grandmother of 5 grandchildren who is retired from working at [employer] .   I last saw her in the office 06/14/20.  She is accompanied by her daughter [name] today.  She was referred to Dr.Shanahan who unsuccessfully intervened on her right SFA 08/01/19.  She does have a history of tobacco abuse, hypertension, hyperlipidemia and mitral valve disease.  She has right calf lifestyle limiting claudication.  Her left calf is felt good since I intervened 07/22/2012 of a left SFA CTO.  I am going to obtain lower extremity arterial Doppler studies on her, review her op note from [facility] and decide whether or not to proceed with reattempt at right lower extremity revascularization.    I obtain lower extremity Dopplers on her 04/08/2020 revealing a right ABI of 0.51 and a left of 1.08.  She did appear to have a high-frequency signal in her distal left common femoral artery.  Her left SFA stent was patent.  Her right SFA was occluded and she had one-vessel runoff via peroneal.  Dr. Jackee did recommend femoropopliteal bypass grafting after failed attempt at right SFA percutaneous revascularization.  The patient has been on Pletal  without benefit.     I performed peripheral angiography on her 05/23/2020 revealing a patent left SFA self-expanding stent from the origin down to the adductor canal with diffuse 30% in-stent restenosis with one-vessel runoff via the peroneal.  She did have an 80% calcified left common femoral artery stenosis at the takeoff of the profunda femoris.  Her right SFA was occluded at its origin, reconstituting in the adductor canal by profunda femoris collaterals with a 90% right popliteal artery stenosis and one-vessel runoff via the peroneal.  I  did not think she had percutaneous options for revascularization or when she had good bypass candidate.  Since I saw her 3-1/2 years ago she did develop gangrene in her right leg and underwent right BKA by Dr. Sheree  10/15/2023.  She is currently in a rehab facility.  She does have moderate dementia as well.  Her last 2D echo performed 12/09/2023 revealed normal LV systolic function, severe basal septal hypertrophy, grade 2 diastolic dysfunction, mild MR, moderate AI, and mild aortic stenosis.   Current Meds  Medication Sig   acetaminophen  (TYLENOL ) 325 MG tablet Take 2 tablets (650 mg total) by mouth every 4 (four) hours as needed for mild pain (pain score 1-3) or fever (or Fever >/= 101).   allopurinol  (ZYLOPRIM ) 100 MG tablet Take 1 tablet (100 mg total) by mouth every evening.   Amino Acids-Protein Hydrolys (FEEDING SUPPLEMENT, PRO-STAT 64,) LIQD Take 30 mLs by mouth 2 (two) times daily.   amLODipine  (NORVASC ) 5 MG tablet Take 5 mg by mouth daily.   apixaban  (ELIQUIS ) 2.5 MG TABS tablet Take 2.5 mg by mouth 2 (two) times daily.   Calcium  Carbonate Antacid (CALCIUM  CARBONATE PO) Take 1 tablet by mouth daily.   clopidogrel  (PLAVIX ) 75 MG tablet Take 1 tablet (75 mg total) by mouth daily.   cyanocobalamin  (VITAMIN B12) 1000 MCG/ML injection Inject 1 mL (1,000 mcg total) into the muscle every 30 (thirty) days.   cyclobenzaprine  (FLEXERIL ) 10 MG tablet Take 10 mg by mouth at bedtime.  ferrous sulfate  325 (65 FE) MG tablet Take 325 mg by mouth daily with breakfast.   fluticasone (FLONASE) 50 MCG/ACT nasal spray Place 1 spray into both nostrils daily.   melatonin 3 MG TABS tablet Take 1 tablet (3 mg total) by mouth at bedtime.   metoprolol  tartrate (LOPRESSOR ) 25 MG tablet Take 12.5 mg by mouth 2 (two) times daily.   Multiple Vitamins-Minerals (MULTIVITAMIN WITH MINERALS) tablet Take 1 tablet by mouth daily.   nicotine  (NICODERM CQ  - DOSED IN MG/24 HOURS) 21 mg/24hr patch Place 21 mg onto the skin  daily.   omeprazole (PRILOSEC) 40 MG capsule Take 40 mg by mouth every evening.   polyethylene glycol (MIRALAX  / GLYCOLAX ) 17 g packet Take 17 g by mouth daily.   sodium chloride  (OCEAN) 0.65 % SOLN nasal spray Place 1 spray into both nostrils as needed for congestion.   thiamine  (VITAMIN B1) 100 MG tablet Take 100 mg by mouth daily.   torsemide  (DEMADEX ) 10 MG tablet Take 10 mg by mouth daily.   Vitamin D, Ergocalciferol, (DRISDOL) 1.25 MG (50000 UNIT) CAPS capsule Take 50,000 Units by mouth every 7 (seven) days.     Allergies  Allergen Reactions   Lactose Intolerance (Gi) Diarrhea    Social History   Socioeconomic History   Marital status: Widowed    Spouse name: Not on file   Number of children: 2   Years of education: Not on file   Highest education level: Not on file  Occupational History   Occupation: retired  Tobacco Use   Smoking status: Former    Current packs/day: 0.00    Average packs/day: 0.5 packs/day for 45.0 years (22.5 ttl pk-yrs)    Types: Cigarettes    Start date: 05/11/1962    Quit date: 05/12/2007    Years since quitting: 16.6   Smokeless tobacco: Never  Vaping Use   Vaping status: Never Used  Substance and Sexual Activity   Alcohol use: Not Currently    Comment: no alcohol since around 04/2023   Drug use: No   Sexual activity: Not on file  Other Topics Concern   Not on file  Social History Narrative   Not on file   Social Drivers of Health   Financial Resource Strain: Low Risk  (05/06/2023)   Overall Financial Resource Strain (CARDIA)    Difficulty of Paying Living Expenses: Not very hard  Food Insecurity: Patient Unable To Answer (10/17/2023)   Hunger Vital Sign    Worried About Running Out of Food in the Last Year: Patient unable to answer    Ran Out of Food in the Last Year: Patient unable to answer  Transportation Needs: No Transportation Needs (10/17/2023)   PRAPARE - Administrator, Civil Service (Medical): No    Lack of  Transportation (Non-Medical): No  Physical Activity: Not on file  Stress: Not on file  Social Connections: Patient Unable To Answer (10/17/2023)   Social Connection and Isolation Panel    Frequency of Communication with Friends and Family: Patient unable to answer    Frequency of Social Gatherings with Friends and Family: Patient unable to answer    Attends Religious Services: Patient unable to answer    Active Member of Clubs or Organizations: Patient unable to answer    Attends Banker Meetings: Patient unable to answer    Marital Status: Patient unable to answer  Intimate Partner Violence: Patient Unable To Answer (10/17/2023)   Humiliation, Afraid, Rape, and Kick questionnaire  Fear of Current or Ex-Partner: Patient unable to answer    Emotionally Abused: Patient unable to answer    Physically Abused: Patient unable to answer    Sexually Abused: Patient unable to answer     Review of Systems: General: negative for chills, fever, night sweats or weight changes.  Cardiovascular: negative for chest pain, dyspnea on exertion, edema, orthopnea, palpitations, paroxysmal nocturnal dyspnea or shortness of breath Dermatological: negative for rash Respiratory: negative for cough or wheezing Urologic: negative for hematuria Abdominal: negative for nausea, vomiting, diarrhea, bright red blood per rectum, melena, or hematemesis Neurologic: negative for visual changes, syncope, or dizziness All other systems reviewed and are otherwise negative except as noted above.    Blood pressure 110/60, pulse 94, height 5' 1 (1.549 m), weight 100 lb (45.4 kg), SpO2 99%.  General appearance: alert and no distress Neck: no adenopathy, no JVD, supple, symmetrical, trachea midline, thyroid  not enlarged, symmetric, no tenderness/mass/nodules, and soft bilateral carotid bruits Lungs: clear to auscultation bilaterally Heart: 2/6 outflow tract murmur consistent with aortic stenosis. Extremities:  extremities normal, atraumatic, no cyanosis or edema Pulses: 2+ and symmetric Skin: Skin color, texture, turgor normal. No rashes or lesions Neurologic: Grossly normal  EKG EKG Interpretation Date/Time:  Monday January 17 2024 11:52:52 EDT Ventricular Rate:  94 PR Interval:  130 QRS Duration:  84 QT Interval:  336 QTC Calculation: 420 R Axis:   -37  Text Interpretation: Normal sinus rhythm Left axis deviation ST & T wave abnormality, consider inferolateral ischemia When compared with ECG of 23-May-2023 01:07, PREVIOUS ECG IS PRESENT Confirmed by Court Carrier [phone]) on 01/17/2024 12:11:17 PM    ASSESSMENT AND PLAN:   HLD (hyperlipidemia) History of hyperlipidemia not on statin therapy.  This had been followed by her PCP in the past.  Peripheral artery disease (HCC) History of PAD status post left SFA CTO intervention by myself 07/22/2012.  She had unsuccessful right SFA intervention 08/01/2019 by Dr. Jeryl.  I performed angiography on her 05/23/2020 revealing a patent left SFA stent from the origin down to the adductor canal with 30% in-stent restenosis and one-vessel runoff via the peroneal.  She did have 80% calcified left common femoral artery stenosis at the takeoff of the profunda femoris.  Right SFA was occluded at its origin with reconstituting in the adductor canal by preprepped profunda femoris collaterals with a 90% right popliteal artery stenosis and one-vessel runoff via peroneal.  She apparently had gangrene in her right leg and underwent right BKA by Dr. Sheree  10/15/2023.  Essential hypertension History of essential hypertension with blood pressure measured today at 110/60.  She is on metoprolol .  PAF (paroxysmal atrial fibrillation) (HCC) History of PAF maintaining sinus rhythm on low-dose Eliquis .  Nonrheumatic aortic (valve) stenosis 2D echo performed 12/09/2023 revealed normal LV systolic function, severe LVH, mild MR, moderate AI and mild AS.  She is not ambulatory  and minimally symptomatic.  We will not continue to follow this.     Carrier DOROTHA Court MD FACP,FACC,FAHA, [facility] 01/17/2024 12:29 PM

## 2024-01-17 NOTE — Assessment & Plan Note (Signed)
 History of hyperlipidemia not on statin therapy.  This had been followed by her PCP in the past.

## 2024-01-17 NOTE — Assessment & Plan Note (Signed)
 2D echo performed 12/09/2023 revealed normal LV systolic function, severe LVH, mild MR, moderate AI and mild AS.  She is not ambulatory and minimally symptomatic.  We will not continue to follow this.

## 2024-01-17 NOTE — Assessment & Plan Note (Signed)
History of PAF maintaining sinus rhythm on low-dose Eliquis.

## 2024-01-17 NOTE — Assessment & Plan Note (Signed)
 History of PAD status post left SFA CTO intervention by myself 07/22/2012.  She had unsuccessful right SFA intervention 08/01/2019 by Dr. Jeryl.  I performed angiography on her 05/23/2020 revealing a patent left SFA stent from the origin down to the adductor canal with 30% in-stent restenosis and one-vessel runoff via the peroneal.  She did have 80% calcified left common femoral artery stenosis at the takeoff of the profunda femoris.  Right SFA was occluded at its origin with reconstituting in the adductor canal by preprepped profunda femoris collaterals with a 90% right popliteal artery stenosis and one-vessel runoff via peroneal.  She apparently had gangrene in her right leg and underwent right BKA by Dr. Sheree  10/15/2023.

## 2024-01-17 NOTE — Patient Instructions (Signed)
 Medication Instructions:  Your physician recommends that you continue on your current medications as directed. Please refer to the Current Medication list given to you today.  *If you need a refill on your cardiac medications before your next appointment, please call your pharmacy*   Follow-Up: At [facility], you and your health needs are our priority.  As part of our continuing mission to provide you with exceptional heart care, our providers are all part of one team.  This team includes your primary Cardiologist (physician) and Advanced Practice Providers or APPs (Physician Assistants and Nurse Practitioners) who all work together to provide you with the care you need, when you need it.  Your next appointment:   6 month(s)  Provider:   Glendia Ferrier, PA-C         Then, Dorn Lesches, MD will plan to see you again in 12 month(s).

## 2024-01-17 NOTE — Assessment & Plan Note (Signed)
 History of essential hypertension with blood pressure measured today at 110/60.  She is on metoprolol .

## 2024-03-13 ENCOUNTER — Encounter: Payer: Self-pay | Admitting: Radiology

## 2024-07-17 ENCOUNTER — Ambulatory Visit: Admitting: Physician Assistant
